# Patient Record
Sex: Male | Born: 1965 | Race: White | Hispanic: No | Marital: Married | Smoking: Heavy tobacco smoker
Health system: Southern US, Community
[De-identification: ages and names within clinical notes are randomized; demographics above are authoritative.]

## PROBLEM LIST (undated history)

## (undated) DIAGNOSIS — K219 Gastro-esophageal reflux disease without esophagitis: Secondary | ICD-10-CM

## (undated) DIAGNOSIS — I2510 Atherosclerotic heart disease of native coronary artery without angina pectoris: Secondary | ICD-10-CM

## (undated) DIAGNOSIS — I10 Essential (primary) hypertension: Secondary | ICD-10-CM

## (undated) DIAGNOSIS — E119 Type 2 diabetes mellitus without complications: Secondary | ICD-10-CM

## (undated) DIAGNOSIS — I639 Cerebral infarction, unspecified: Secondary | ICD-10-CM

## (undated) HISTORY — PX: CORONARY ANGIOPLASTY WITH STENT PLACEMENT: SHX49

---

## 2003-10-22 ENCOUNTER — Other Ambulatory Visit: Payer: Self-pay

## 2006-09-10 DIAGNOSIS — E785 Hyperlipidemia, unspecified: Secondary | ICD-10-CM | POA: Insufficient documentation

## 2006-09-10 DIAGNOSIS — K2100 Gastro-esophageal reflux disease with esophagitis, without bleeding: Secondary | ICD-10-CM | POA: Insufficient documentation

## 2008-06-18 HISTORY — PX: CORONARY ANGIOPLASTY WITH STENT PLACEMENT: SHX49

## 2013-07-15 ENCOUNTER — Emergency Department: Payer: Self-pay | Admitting: Emergency Medicine

## 2013-07-15 LAB — CBC WITH DIFFERENTIAL/PLATELET
Basophil #: 0.1 10*3/uL (ref 0.0–0.1)
Basophil %: 1 %
Eosinophil #: 0.1 10*3/uL (ref 0.0–0.7)
Eosinophil %: 0.9 %
HCT: 48.5 % (ref 40.0–52.0)
HGB: 17.1 g/dL (ref 13.0–18.0)
LYMPHS ABS: 3.5 10*3/uL (ref 1.0–3.6)
LYMPHS PCT: 23.7 %
MCH: 30.7 pg (ref 26.0–34.0)
MCHC: 35.3 g/dL (ref 32.0–36.0)
MCV: 87 fL (ref 80–100)
Monocyte #: 0.7 x10 3/mm (ref 0.2–1.0)
Monocyte %: 4.9 %
Neutrophil #: 10.2 10*3/uL — ABNORMAL HIGH (ref 1.4–6.5)
Neutrophil %: 69.5 %
PLATELETS: 377 10*3/uL (ref 150–440)
RBC: 5.58 10*6/uL (ref 4.40–5.90)
RDW: 13.1 % (ref 11.5–14.5)
WBC: 14.7 10*3/uL — ABNORMAL HIGH (ref 3.8–10.6)

## 2013-07-15 LAB — LIPASE, BLOOD: Lipase: 153 U/L (ref 73–393)

## 2013-07-16 LAB — TROPONIN I: Troponin-I: <0.02 ng/mL

## 2013-07-16 LAB — COMPREHENSIVE METABOLIC PANEL
ALT: 35 U/L (ref 12–78)
Albumin: 3.2 g/dL — ABNORMAL LOW (ref 3.4–5.0)
Alkaline Phosphatase: 112 U/L
Anion Gap: 12 (ref 7–16)
BILIRUBIN TOTAL: 0.8 mg/dL (ref 0.2–1.0)
BUN: 13 mg/dL (ref 7–18)
CALCIUM: 7.7 mg/dL — AB (ref 8.5–10.1)
Chloride: 95 mmol/L — ABNORMAL LOW (ref 98–107)
Co2: 26 mmol/L (ref 21–32)
Creatinine: 1.14 mg/dL (ref 0.60–1.30)
EGFR (African American): >60
GLUCOSE: 335 mg/dL — AB (ref 65–99)
Osmolality: 280 (ref 275–301)
POTASSIUM: 3.5 mmol/L (ref 3.5–5.1)
SODIUM: 133 mmol/L — AB (ref 136–145)
TOTAL PROTEIN: 6.9 g/dL (ref 6.4–8.2)

## 2013-07-16 LAB — HEMOGLOBIN A1C: HEMOGLOBIN A1C: 13 % — AB (ref 4.2–6.3)

## 2014-05-24 DIAGNOSIS — E782 Mixed hyperlipidemia: Secondary | ICD-10-CM | POA: Insufficient documentation

## 2014-05-24 DIAGNOSIS — I2510 Atherosclerotic heart disease of native coronary artery without angina pectoris: Secondary | ICD-10-CM | POA: Insufficient documentation

## 2014-05-24 DIAGNOSIS — I10 Essential (primary) hypertension: Secondary | ICD-10-CM | POA: Insufficient documentation

## 2014-07-22 ENCOUNTER — Telehealth: Payer: Self-pay | Admitting: Family Medicine

## 2014-07-22 NOTE — Telephone Encounter (Signed)
Son will try to get him in here next week for an appointment. States he is smoking 3+ packs of cigarettes a day along with the weight loss.

## 2014-07-22 NOTE — Telephone Encounter (Signed)
Pt's son called concerned over dad's health.  Would like to talk to someone.  Please call him at [phone].

## 2014-07-22 NOTE — Telephone Encounter (Signed)
Spoke with patients son Harrison Mons( Blake) who states that patient is not doing good, he states within the last year patient has been diagnosed with diabetes and he feels that patient is non compliant with medication and that patient has had dramatic weight loss in the past 6 months >30lbs. He states that patient looks like a "cancer patient" and he is concerned over his well being physically and mentally. Son would like patient be referred to a specialist in regards to his diabetes. And would like for Nadine CountsBob to give him a call back so he can discuss patients condition more.

## 2014-07-28 DIAGNOSIS — IMO0001 Reserved for inherently not codable concepts without codable children: Secondary | ICD-10-CM | POA: Insufficient documentation

## 2014-07-28 DIAGNOSIS — I2510 Atherosclerotic heart disease of native coronary artery without angina pectoris: Secondary | ICD-10-CM | POA: Insufficient documentation

## 2014-07-28 DIAGNOSIS — K219 Gastro-esophageal reflux disease without esophagitis: Secondary | ICD-10-CM | POA: Insufficient documentation

## 2014-07-28 DIAGNOSIS — E1165 Type 2 diabetes mellitus with hyperglycemia: Secondary | ICD-10-CM | POA: Insufficient documentation

## 2014-07-28 DIAGNOSIS — R030 Elevated blood-pressure reading, without diagnosis of hypertension: Secondary | ICD-10-CM

## 2014-07-29 ENCOUNTER — Ambulatory Visit (INDEPENDENT_AMBULATORY_CARE_PROVIDER_SITE_OTHER): Payer: Self-pay | Admitting: Family Medicine

## 2014-07-29 ENCOUNTER — Encounter: Payer: Self-pay | Admitting: Family Medicine

## 2014-07-29 VITALS — BP 152/86 | HR 70 | Temp 97.8°F | Resp 16 | Ht 70.0 in | Wt 177.6 lb

## 2014-07-29 DIAGNOSIS — E785 Hyperlipidemia, unspecified: Secondary | ICD-10-CM

## 2014-07-29 DIAGNOSIS — E1165 Type 2 diabetes mellitus with hyperglycemia: Secondary | ICD-10-CM

## 2014-07-29 DIAGNOSIS — I10 Essential (primary) hypertension: Secondary | ICD-10-CM

## 2014-07-29 DIAGNOSIS — Z720 Tobacco use: Secondary | ICD-10-CM

## 2014-07-29 LAB — POCT GLYCOSYLATED HEMOGLOBIN (HGB A1C): HEMOGLOBIN A1C: 6.8

## 2014-07-29 MED ORDER — VARENICLINE TARTRATE 0.5 MG PO TABS: 0.5000 mg | ORAL_TABLET | Freq: Two times a day (BID) | ORAL | Status: DC

## 2014-07-29 NOTE — Patient Instructions (Signed)
Try to remember to take your medication twice daily.

## 2014-07-29 NOTE — Progress Notes (Signed)
Subjective:     Patient ID: Kenneth CluckJimmy R [name], male   DOB: 11/18/1965, 49 y.o.   MRN: [id number]  HPI  Chief Complaint  Patient presents with  . Diabetes    patient presents for follow up appt from 05/21/14. Last office visit we increased Metformin to 1000mg  BID, to consider Glipizide. HgbA1C was 9.9%  He come in with his wife as his family is concerned about increased weight loss (10# over 3 months) and irritability. Continues to smoke 2.5 ppd. Reports he is taking metformin 1000 mg usually twice daily but will occasionally skip a dose. He tends to skip lisinopril and has not taken it for a week. States it gives him diarrhea.   Review of Systems  Respiratory: Negative for shortness of breath.   Cardiovascular: Negative for chest pain and palpitations.       Objective:   Physical Exam  Constitutional: He appears well-developed and well-nourished. No distress.  Cardiovascular: Normal rate and regular rhythm.   Pulmonary/Chest: Breath sounds normal.  Musculoskeletal: He exhibits no edema (in lower extremties).       Assessment:    1. Benign essential HTN-consider switch to losartan; patient defers for now. - Comprehensive metabolic panel; Future  2. Type 2 diabetes mellitus with hyperglycemia: controlled  - Comprehensive metabolic panel; Future - POCT glycosylated hemoglobin (Hb A1C)  3. HLD (hyperlipidemia) - Lipid panel; Future  4. Tobacco use: wishes to try Chantix as he has quit before on this. - varenicline (CHANTIX) 0.5 MG tablet; Take 1 tablet (0.5 mg total) by mouth 2 (two) times daily.  Dispense: 60 tablet; Refill: 0    Plan:    Further f/u pending lab work.

## 2014-07-30 ENCOUNTER — Other Ambulatory Visit: Payer: Self-pay | Admitting: Family Medicine

## 2014-07-30 DIAGNOSIS — I10 Essential (primary) hypertension: Secondary | ICD-10-CM

## 2014-07-30 DIAGNOSIS — Z0000 Encounter for general adult medical examination without abnormal findings: Secondary | ICD-10-CM

## 2014-07-30 DIAGNOSIS — Z13220 Encounter for screening for lipoid disorders: Secondary | ICD-10-CM

## 2014-07-31 LAB — LIPID PANEL
Chol/HDL Ratio: 5.6 ratio units — ABNORMAL HIGH (ref 0.0–5.0)
Cholesterol, Total: 174 mg/dL (ref 100–199)
HDL: 31 mg/dL — AB (ref >39)
LDL Calculated: 111 mg/dL — ABNORMAL HIGH (ref 0–99)
Triglycerides: 162 mg/dL — ABNORMAL HIGH (ref 0–149)
VLDL CHOLESTEROL CAL: 32 mg/dL (ref 5–40)

## 2014-07-31 LAB — COMPREHENSIVE METABOLIC PANEL
A/G RATIO: 1.9 (ref 1.1–2.5)
ALK PHOS: 82 IU/L (ref 39–117)
ALT: 13 IU/L (ref 0–44)
AST: 10 IU/L (ref 0–40)
Albumin: 4.5 g/dL (ref 3.5–5.5)
BUN/Creatinine Ratio: 7 — ABNORMAL LOW (ref 9–20)
BUN: 8 mg/dL (ref 6–24)
Bilirubin Total: 0.6 mg/dL (ref 0.0–1.2)
CHLORIDE: 99 mmol/L (ref 97–108)
CO2: 27 mmol/L (ref 18–29)
Calcium: 9.7 mg/dL (ref 8.7–10.2)
Creatinine, Ser: 1.09 mg/dL (ref 0.76–1.27)
GFR calc Af Amer: 92 mL/min/{1.73_m2} (ref >59)
GFR calc non Af Amer: 79 mL/min/{1.73_m2} (ref >59)
Globulin, Total: 2.4 g/dL (ref 1.5–4.5)
Glucose: 122 mg/dL — ABNORMAL HIGH (ref 65–99)
POTASSIUM: 4.5 mmol/L (ref 3.5–5.2)
Sodium: 142 mmol/L (ref 134–144)
TOTAL PROTEIN: 6.9 g/dL (ref 6.0–8.5)

## 2014-08-03 ENCOUNTER — Telehealth: Payer: Self-pay

## 2014-08-03 NOTE — Telephone Encounter (Signed)
-----   Message from Anola Gurneyobert Chauvin, [place] sent at 08/03/2014  8:03 AM EDT ----- Cholesterol is mildly elevated but with diabetes we usually recommend being placed on a statin drug. Do you wish to start medication? Your other labs were ok including kidneys and liver.

## 2014-08-03 NOTE — Telephone Encounter (Signed)
Patient advised of lab results, copy was printed for him to pick up. He states that he will think this over and speak with wife and call you back if he wishes to start statin drug.

## 2014-08-03 NOTE — Telephone Encounter (Signed)
LMTCB

## 2014-10-18 ENCOUNTER — Ambulatory Visit: Payer: Self-pay | Admitting: Family Medicine

## 2015-06-19 ENCOUNTER — Emergency Department
Admission: EM | Admit: 2015-06-19 | Discharge: 2015-06-19 | Disposition: A | Discharge status: Home / Self Care | Payer: Self-pay | Attending: Emergency Medicine | Admitting: Emergency Medicine

## 2015-06-19 ENCOUNTER — Emergency Department: Payer: Self-pay

## 2015-06-19 DIAGNOSIS — E785 Hyperlipidemia, unspecified: Secondary | ICD-10-CM | POA: Insufficient documentation

## 2015-06-19 DIAGNOSIS — E1165 Type 2 diabetes mellitus with hyperglycemia: Secondary | ICD-10-CM | POA: Insufficient documentation

## 2015-06-19 DIAGNOSIS — F17210 Nicotine dependence, cigarettes, uncomplicated: Secondary | ICD-10-CM | POA: Insufficient documentation

## 2015-06-19 DIAGNOSIS — R079 Chest pain, unspecified: Secondary | ICD-10-CM | POA: Insufficient documentation

## 2015-06-19 DIAGNOSIS — I10 Essential (primary) hypertension: Secondary | ICD-10-CM | POA: Insufficient documentation

## 2015-06-19 DIAGNOSIS — Z7984 Long term (current) use of oral hypoglycemic drugs: Secondary | ICD-10-CM | POA: Insufficient documentation

## 2015-06-19 HISTORY — DX: Atherosclerotic heart disease of native coronary artery without angina pectoris: I25.10

## 2015-06-19 HISTORY — DX: Type 2 diabetes mellitus without complications: E11.9

## 2015-06-19 HISTORY — DX: Essential (primary) hypertension: I10

## 2015-06-19 LAB — BASIC METABOLIC PANEL
ANION GAP: 7 (ref 5–15)
BUN: 15 mg/dL (ref 6–20)
CALCIUM: 8.9 mg/dL (ref 8.9–10.3)
CO2: 25 mmol/L (ref 22–32)
Chloride: 105 mmol/L (ref 101–111)
Creatinine, Ser: 1.11 mg/dL (ref 0.61–1.24)
GLUCOSE: 227 mg/dL — AB (ref 65–99)
Potassium: 3.9 mmol/L (ref 3.5–5.1)
SODIUM: 137 mmol/L (ref 135–145)

## 2015-06-19 LAB — CBC
HCT: 47.3 % (ref 40.0–52.0)
HEMOGLOBIN: 16.2 g/dL (ref 13.0–18.0)
MCH: 29.3 pg (ref 26.0–34.0)
MCHC: 34.3 g/dL (ref 32.0–36.0)
MCV: 85.4 fL (ref 80.0–100.0)
Platelets: 270 10*3/uL (ref 150–440)
RBC: 5.54 MIL/uL (ref 4.40–5.90)
RDW: 12.9 % (ref 11.5–14.5)
WBC: 11.8 10*3/uL — AB (ref 3.8–10.6)

## 2015-06-19 LAB — TROPONIN I
TROPONIN I: 0.04 ng/mL — AB (ref <0.031)
TROPONIN I: 0.04 ng/mL — AB (ref <0.031)

## 2015-06-19 NOTE — ED Notes (Signed)
Pt requesting to leave, explained that Dr Sheryle Hailiamond would be down shortly to go over results.  Pt demanding to have IV out and to leave facility.  IV removed by Janus MolderVanessa Ashley, RN.  Pt asking for lab results, educated patient that the results have to come from the physician or that they can call medical records to get a copy of results.  Explained that physician is in an emergency at this time, but will be back to talk with patient shortly.  Pt states he will wait for a "little bit" longer for physician to give patient results.  Pt signed AMA paperwork prior to agreeing to stay for physician.

## 2015-06-19 NOTE — ED Notes (Signed)
Pt states that all of his sx have resolved. Pt no longer c/o chest pain, HA or left arm aching/numbness. Pt alert and oriented X4, active, cooperative, pt in NAD. RR even and unlabored, color WNL.

## 2015-06-19 NOTE — ED Notes (Signed)
Troponin of 0.04 verbal readback to [place], acknowledges.

## 2015-06-19 NOTE — ED Notes (Signed)
Pt left AMA.  Family member driving.  Prescriptions written by Dr. Sheryle Hailiamond and instructions given to patient.  Verbalized understanding.  Pt to follow up with Dr Gwen PoundsKowalski this week for out patient stress test.  Pt verbalized understanding of coming back to ER if symptoms returned or worsened.  No questions or concerns at this time.

## 2015-06-19 NOTE — ED Notes (Signed)
DR. Sheryle Hailiamond at bedside.

## 2015-06-19 NOTE — ED Notes (Signed)
Pt reports that he took 4 baby ASA PTA.

## 2015-06-19 NOTE — ED Notes (Signed)
MD at bedside. 

## 2015-06-19 NOTE — ED Notes (Signed)
Pt signed out AMA.  Pt given Coreg prescription by Dr. Sheryle Hailiamond and counseled to stop smoking.  Pt verbalized understanding.  Pt to follow up with Dr. Gwen PoundsKowalski this week for outpatient stress test.

## 2015-06-19 NOTE — ED Notes (Signed)
Report to Iris, RN  

## 2015-06-19 NOTE — ED Provider Notes (Addendum)
[facility] Emergency Department Provider Note  ____________________________________________   I have reviewed the triage vital signs and the nursing notes.   HISTORY  Chief Complaint Chest Pain    HPI Kenneth [name] is a 50 y.o. male with a history of stents, placed in 2010,hypertension and high cholesterol diabetes, who does not take a daily aspirin or Plavix, is no longer taking any blood pressure medication. States that he had a left-sided squeezing chest discomfort which radiated to the left arm which started around 10:30 at rest. Not exertional. He has had no exertional dyspnea. It was not positional. No recollected injury. He denies any nausea or vomiting. He denies any shortness of breath. He states the pain was not pleuritic. He has had no calf pain recent travel or personal or family history of PE or DVT. He states he had a very slight headache at the same time. This is his normal headache. He did take 4 baby aspirin. He denies any neurologic symptoms otherwise. The pain was not tearing, it was gradual in onset did not radiate to the back. He did have some pain that went down his left arm. This was similar but not identical to the pain that he had when he had a stent placed in 2010. He states his pain is a 1 or 2 out of 10 when I'm in the room. He would prefer not to have anything else for pain this time. He told me that he was not diaphoretic. Nothing made the pain better and nothing made the pain worse.    Past Medical History  Diagnosis Date  . Hypertension   . Coronary artery disease   . Diabetes mellitus without complication [facility])     Patient Active Problem List   Diagnosis Date Noted  . Arteriosclerosis of coronary artery 07/28/2014  . Type 2 diabetes mellitus with hyperglycemia (HCC) 07/28/2014  . Blood pressure elevated 07/28/2014  . Esophageal reflux 07/28/2014  . Combined fat and carbohydrate induced hyperlipemia 05/24/2014   . CAD in native artery 05/24/2014  . Benign essential HTN 05/24/2014  . Esophagitis, reflux 09/10/2006  . HLD (hyperlipidemia) 09/10/2006    Past Surgical History  Procedure Laterality Date  . Coronary angioplasty with stent placement  06/18/2008  . Coronary angioplasty with stent placement      Current Outpatient Rx  Name  Route  Sig  Dispense  Refill  . aspirin (ASPIRIN LOW DOSE) 81 MG EC tablet   Oral   Take 1 tablet by mouth daily.         Marland Kitchen EXPIRED: lisinopril (PRINIVIL,ZESTRIL) 10 MG tablet   Oral   Take by mouth.         . metFORMIN (GLUCOPHAGE) 500 MG tablet   Oral   Take 1 tablet by mouth 2 (two) times daily.         . varenicline (CHANTIX) 0.5 MG tablet   Oral   Take 1 tablet (0.5 mg total) by mouth 2 (two) times daily.   60 tablet   0     Allergies Review of patient's allergies indicates no known allergies.  Family History  Problem Relation Age of Onset  . Heart attack Mother   . Heart failure Mother   . Coronary artery disease Father   . Diabetes Father     Social History Social History  Substance Use Topics  . Smoking status: Heavy Tobacco Smoker -- 2.00 packs/day    Types: Cigarettes  . Smokeless tobacco:  None  . Alcohol Use: No     Comment: OCCASIONALLY    Review of Systems Constitutional: No fever/chills Eyes: No visual changes. ENT: No sore throat. No stiff neck no neck pain Cardiovascular: Positive chest pain. Respiratory: Denies shortness of breath. Gastrointestinal:   no vomiting.  No diarrhea.  No constipation. Genitourinary: Negative for dysuria. Musculoskeletal: Negative lower extremity swelling Skin: Negative for rash. Neurological: Negative for headaches, focal weakness or numbness. 10-point ROS otherwise negative.  ____________________________________________   PHYSICAL EXAM:  VITAL SIGNS: ED Triage Vitals  Enc Vitals Group     BP 06/19/15 1659 190/118 mmHg     Pulse Rate 06/19/15 1659 97     Resp 06/19/15  1659 22     Temp 06/19/15 1659 99 F (37.2 C)     Temp Source 06/19/15 1659 Oral     SpO2 06/19/15 1659 98 %     Weight 06/19/15 1659 170 lb (77.111 kg)     Height 06/19/15 1659  (1.778 m)     Head Cir --      Peak Flow --      Pain Score 06/19/15 1700 3     Pain Loc --      Pain Edu? --      Excl. in GC? --     Constitutional: Alert and oriented. Well appearing and in no acute distress. Eyes: Conjunctivae are normal. PERRL. EOMI. Head: Atraumatic. Nose: No congestion/rhinnorhea. Mouth/Throat: Mucous membranes are moist.  Oropharynx non-erythematous. Neck: No stridor.   Nontender with no meningismus Cardiovascular: Normal rate, regular rhythm. Grossly normal heart sounds.  Good peripheral circulation. Respiratory: Normal respiratory effort.  No retractions. Lungs CTAB. Abdominal: Soft and nontender. No distention. No guarding no rebound Back:  There is no focal tenderness or step off there is no midline tenderness there are no lesions noted. there is no CVA tenderness Musculoskeletal: No lower extremity tenderness. No joint effusions, no DVT signs strong distal pulses no edema Neurologic:  Normal speech and language. No gross focal neurologic deficits are appreciated.  Skin:  Skin is warm, dry and intact. No rash noted. Psychiatric: Mood and affect are normal. Speech and behavior are normal.  ____________________________________________   LABS (all labs ordered are listed, but only abnormal results are displayed)  Labs Reviewed  BASIC METABOLIC PANEL - Abnormal; Notable for the following:    Glucose, Bld 227 (*)    All other components within normal limits  CBC - Abnormal; Notable for the following:    WBC 11.8 (*)    All other components within normal limits  TROPONIN I - Abnormal; Notable for the following:    Troponin I 0.04 (*)    All other components within normal limits   ____________________________________________  EKG  I personally interpreted any EKGs  ordered by me or triage EKG #1: Sinus rhythm rate 90 bpm no acute ST elevation or depression, nonspecific ST changes noted, normal axis. EKG #2: Sinus rhythm rate 83 bpm, no acute ST elevation or depression normal axis unremarkable EKG  ____________________________________________  RADIOLOGY  I reviewed any imaging ordered by me or triage that were performed during my shift and, if possible, patient and/or family made aware of any abnormal findings. ____________________________________________   PROCEDURES  Procedure(s) performed: None  Critical Care performed: None  ____________________________________________   INITIAL IMPRESSION / ASSESSMENT AND PLAN / ED COURSE  Pertinent labs & imaging results that were available during my care of the patient were reviewed by me and considered  in my medical decision making (see chart for details).  Patient with reassuring exam, nothing to suggest PE or dissection based on history. However he does have a gradual onset left-sided chest pain that radiated down his left arm. He is pain-free at this moment. He did have aspirin already. Troponin is borderline but this is not an unusual finding for lab and is in fact quite nonspecific. We will keep the patient for a second cardiac enzyme and talk to Dr. Gwen PoundsKowalski his cardiologist. We will monitor him closely in the emergency room. Serial EKGs no evidence of evolving ischemia. A he is a symptomatic this time. He did have a slight headache there is no evidence of CVA, and the headache is completely gone on its own. Patient is much calmer at this time initially he was quite anxious, or is trending down rapidly as he relaxes. He is a heavy smoker with significant CAD history however and we will be very vigilant. Patient and family counseled about this plan and agree.  ----------------------------------------- 6:16 PM on 06/19/2015 -----------------------------------------  Son takes me aside and states his  father is smoking 4-5 packs of cigarettes a day and gets winded just walking to the mailbox. This is different from what the father told me. The patient son states that his father will deny this. Given this and his concerning story with chest pain radiating to left arm I think it would be beneficial to admit the patient. We will discuss with the hospitalist.  ----------------------------------------- 7:30 PM on 06/19/2015 -----------------------------------------  I have strongly advised admission to the hospital until the patient is at risk for death if he goes home. This is baseline for him in no uncertain terms. After this discussion, patient became anxious and his blood pressure went up but he is not having any ongoing chest pain. He is at this time declining admission "for now". I have also talked to the hospitalist, Dr. Sheryle Haildiamond, who was kind enough to talk to the patient and also strenuously advised admission, patient however is at this time waiting for the second troponin to come back before he "makes up his mind". He is asymptomatic this time and we will continue to monitor him.  ----------------------------------------- 9:06 PM on 06/19/2015 -----------------------------------------  After extensive discussion with myself and Dr. Sheryle Haildiamond about the risks of going home and our strong advice that he stay, patient is elected to leave AGAINST MEDICAL ADVICE. He understands the risk of . Death or significant morbidity from heart problems including becoming a cardiac cripple. Patient's blood pressures up and he is aware of this. He refuses to say he refuses any more intervention he is requesting that we remove the IV and discharge him AGAINST MEDICAL ADVICE right away. He is at significant back at any time. Family are present in the room during this conversation. I feel patient understands the risks benefits and alternatives and is capable of making up his mind about the decision to go home     ____________________________________________   FINAL CLINICAL IMPRESSION(S) / ED DIAGNOSES  Final diagnoses:  None      This chart was dictated using voice recognition software.  Despite best efforts to proofread,  errors can occur which can change meaning.     Jeanmarie PlantJames A McShane, MD 06/19/15 1801  Jeanmarie PlantJames A McShane, MD 06/19/15 [id number]  Jeanmarie PlantJames A McShane, MD 06/19/15 1931  Jeanmarie PlantJames A McShane, MD 06/19/15 2107

## 2015-06-19 NOTE — ED Notes (Signed)
Pt states that he does not want to stay overnight in hospital at this time. Told patient to discuss with admitting MD. Pt states that Dr. Gwen PoundsKowalski is his cardiologist and that he needs to have stress test done with Gwen PoundsKowalski due to upcoming DOT physical. Explained to patient risks of leaving. Pt agrees to speak with Admitting MD about concerns. Pt alert and oriented X4, active, cooperative, pt in NAD. RR even and unlabored, color WNL.  Family at bedside. Pt still reports that all previous sx are gone.

## 2015-06-19 NOTE — ED Notes (Addendum)
Pt arrives to ER via POV c/o chest pain to left side that radiates to left arm, describes as numbness. Pain began today at 1030AM. [facility]; hx of hypertension but does not take medication. Hx of cardiac stent X 7 years ago. Pt reports headache. Denies NV, diaphoresis. Pt alert and oriented X4, active, cooperative, pt in NAD. RR even and unlabored, color WNL. Pt mildly diaphoretic.

## 2015-06-19 NOTE — ED Notes (Signed)
Pt back from xray and on cardiac monitor at this time.

## 2015-06-19 NOTE — ED Notes (Signed)
Patient transported to X-ray 

## 2015-06-19 NOTE — Progress Notes (Signed)
The patient is adamant that he does not want to stay due to financial concerns as well as anticipated delay in stress testing due to his doctor being away from her holiday weekend. I have urged him not to smoke. He states he will get NicoDerm patches over-the-counter as well as niacin. I have prescribed him Coreg 6.25 mg twice a day to decrease myocardial oxygen demand. The patient promises that he will follow-up with his cardiologist in 2 days.

## 2015-06-30 ENCOUNTER — Ambulatory Visit (INDEPENDENT_AMBULATORY_CARE_PROVIDER_SITE_OTHER): Payer: Self-pay | Admitting: Family Medicine

## 2015-06-30 ENCOUNTER — Encounter: Payer: Self-pay | Admitting: Family Medicine

## 2015-06-30 VITALS — BP 138/100 | HR 76 | Temp 97.9°F | Resp 16 | Wt 195.0 lb

## 2015-06-30 DIAGNOSIS — I2510 Atherosclerotic heart disease of native coronary artery without angina pectoris: Secondary | ICD-10-CM

## 2015-06-30 DIAGNOSIS — I10 Essential (primary) hypertension: Secondary | ICD-10-CM | POA: Insufficient documentation

## 2015-06-30 DIAGNOSIS — E1165 Type 2 diabetes mellitus with hyperglycemia: Secondary | ICD-10-CM

## 2015-06-30 DIAGNOSIS — E785 Hyperlipidemia, unspecified: Secondary | ICD-10-CM

## 2015-06-30 LAB — POCT GLYCOSYLATED HEMOGLOBIN (HGB A1C): Hemoglobin A1C: 7.6

## 2015-06-30 MED ORDER — ATORVASTATIN CALCIUM 80 MG PO TABS: 80.0000 mg | ORAL_TABLET | Freq: Every day | ORAL | Status: DC

## 2015-06-30 NOTE — Progress Notes (Signed)
Subjective:     Patient ID: Kenneth [name], male   DOB: 01/19/1966, 50 y.o.   MRN: [id number]  HPI  Chief Complaint  Patient presents with  . Diabetes    Patient returns to office today for follow up visit, last visit was 07/29/14 patients HgbA1C in house was 6.8%. Patient reports good compliance and tolerance on medication. Patient reports that he checks his blood sugar at home from time to time and recent high was 140, patient denies any hyperglycemia incidents  . Hypertension    Patient returns to office for follow up, last visit was 07/29/14. At last office visit it was discuss switching patient to Losartan, but at time of visit patient declined. [facility] for hypertension he states b/p was >200/100. Patient reports he was started on Lisinopril and reports good compliance on medication  . Hyperlipidemia    Follow up from 07/29/14, Lipid panel was ordered at last visit.   Lost to f/u for diabetes and hypertension comes in today after ER and cardiology evaluation. HTN was under poor control and he is pending stress test next week for evaluation of chest pain. Hx of CAD with stents. States he has remained in metformin 1000 mg.twice daily (left over medication from his father's prescription) but misses his AM dose "50% of the time." He has had his lisinopril increased to 10 mg. Two pills twice daily and started on Wellbutrin and Nicotine patches for smoking cessation. 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   Review of Systems     Objective:   Physical Exam  Constitutional: He appears well-developed and well-nourished. No distress.  Lungs: clear Heart: RRR without murmur Lower extremities: no edema; pedal pulses intact, sensation to monofilament intact, no wounds noted.     Assessment:    1. Type 2 diabetes mellitus with hyperglycemia, without long-term  current use of insulin (HCC) - POCT glycosylated hemoglobin (Hb A1C)  2. Arteriosclerosis of coronary artery: per cardiology  3. HLD (hyperlipidemia) - atorvastatin (LIPITOR) 80 MG tablet; Take 1 tablet (80 mg total) by mouth daily.  Dispense: 90 tablet; Refill: 3  4. Essential hypertension: recently increased on medication    Plan:    Stressed taking both metformin doses daily. F/u with cardiology. Continue with smoking cessation. Patient is also using a nicotine patch.

## 2015-06-30 NOTE — Patient Instructions (Signed)
Follow up with Dr. Gwen PoundsKowalski as scheduled. Remember to take both doses of Metformin daily with a meal.

## 2015-09-30 ENCOUNTER — Ambulatory Visit: Payer: Self-pay | Admitting: Family Medicine

## 2015-12-29 ENCOUNTER — Encounter: Payer: Self-pay | Admitting: Emergency Medicine

## 2015-12-29 ENCOUNTER — Observation Stay: Payer: Self-pay

## 2015-12-29 ENCOUNTER — Emergency Department: Payer: Self-pay

## 2015-12-29 ENCOUNTER — Observation Stay
Admit: 2015-12-29 | Discharge: 2015-12-29 | Disposition: A | Discharge status: Home / Self Care | Payer: Self-pay | Attending: Internal Medicine | Admitting: Internal Medicine

## 2015-12-29 ENCOUNTER — Inpatient Hospital Stay
Admission: EM | Admit: 2015-12-29 | Discharge: 2015-12-30 | DRG: 066 | Disposition: A | Discharge status: Home / Self Care | Payer: Self-pay | Attending: Internal Medicine | Admitting: Internal Medicine

## 2015-12-29 DIAGNOSIS — I69354 Hemiplegia and hemiparesis following cerebral infarction affecting left non-dominant side: Secondary | ICD-10-CM

## 2015-12-29 DIAGNOSIS — E1165 Type 2 diabetes mellitus with hyperglycemia: Secondary | ICD-10-CM | POA: Diagnosis present

## 2015-12-29 DIAGNOSIS — I6350 Cerebral infarction due to unspecified occlusion or stenosis of unspecified cerebral artery: Secondary | ICD-10-CM

## 2015-12-29 DIAGNOSIS — I2510 Atherosclerotic heart disease of native coronary artery without angina pectoris: Secondary | ICD-10-CM | POA: Diagnosis present

## 2015-12-29 DIAGNOSIS — E781 Pure hyperglyceridemia: Secondary | ICD-10-CM | POA: Diagnosis present

## 2015-12-29 DIAGNOSIS — Z23 Encounter for immunization: Secondary | ICD-10-CM

## 2015-12-29 DIAGNOSIS — Z8249 Family history of ischemic heart disease and other diseases of the circulatory system: Secondary | ICD-10-CM

## 2015-12-29 DIAGNOSIS — G459 Transient cerebral ischemic attack, unspecified: Secondary | ICD-10-CM

## 2015-12-29 DIAGNOSIS — Z833 Family history of diabetes mellitus: Secondary | ICD-10-CM

## 2015-12-29 DIAGNOSIS — R262 Difficulty in walking, not elsewhere classified: Secondary | ICD-10-CM

## 2015-12-29 DIAGNOSIS — Z7984 Long term (current) use of oral hypoglycemic drugs: Secondary | ICD-10-CM

## 2015-12-29 DIAGNOSIS — I639 Cerebral infarction, unspecified: Principal | ICD-10-CM

## 2015-12-29 DIAGNOSIS — R2681 Unsteadiness on feet: Secondary | ICD-10-CM

## 2015-12-29 DIAGNOSIS — F17210 Nicotine dependence, cigarettes, uncomplicated: Secondary | ICD-10-CM | POA: Diagnosis present

## 2015-12-29 DIAGNOSIS — R29700 NIHSS score 0: Secondary | ICD-10-CM | POA: Diagnosis present

## 2015-12-29 DIAGNOSIS — Z955 Presence of coronary angioplasty implant and graft: Secondary | ICD-10-CM

## 2015-12-29 DIAGNOSIS — I10 Essential (primary) hypertension: Secondary | ICD-10-CM | POA: Diagnosis present

## 2015-12-29 DIAGNOSIS — I63211 Cerebral infarction due to unspecified occlusion or stenosis of right vertebral arteries: Secondary | ICD-10-CM | POA: Diagnosis present

## 2015-12-29 DIAGNOSIS — R200 Anesthesia of skin: Secondary | ICD-10-CM

## 2015-12-29 DIAGNOSIS — E1151 Type 2 diabetes mellitus with diabetic peripheral angiopathy without gangrene: Secondary | ICD-10-CM | POA: Diagnosis present

## 2015-12-29 LAB — URINE DRUG SCREEN, QUALITATIVE (ARMC ONLY)
Amphetamines, Ur Screen: NOT DETECTED
BARBITURATES, UR SCREEN: NOT DETECTED
Benzodiazepine, Ur Scrn: NOT DETECTED
CANNABINOID 50 NG, UR ~~LOC~~: NOT DETECTED
Cocaine Metabolite,Ur ~~LOC~~: NOT DETECTED
MDMA (ECSTASY) UR SCREEN: NOT DETECTED
Methadone Scn, Ur: NOT DETECTED
Opiate, Ur Screen: NOT DETECTED
PHENCYCLIDINE (PCP) UR S: NOT DETECTED
TRICYCLIC, UR SCREEN: NOT DETECTED

## 2015-12-29 LAB — CBC
HEMATOCRIT: 51.3 % (ref 40.0–52.0)
HEMOGLOBIN: 17.9 g/dL (ref 13.0–18.0)
MCH: 28.7 pg (ref 26.0–34.0)
MCHC: 35 g/dL (ref 32.0–36.0)
MCV: 82 fL (ref 80.0–100.0)
Platelets: 273 10*3/uL (ref 150–440)
RBC: 6.25 MIL/uL — AB (ref 4.40–5.90)
RDW: 14.1 % (ref 11.5–14.5)
WBC: 7.9 10*3/uL (ref 3.8–10.6)

## 2015-12-29 LAB — URINALYSIS, COMPLETE (UACMP) WITH MICROSCOPIC
BACTERIA UA: NONE SEEN
Bilirubin Urine: NEGATIVE
Glucose, UA: >=500 mg/dL — AB
Ketones, ur: NEGATIVE mg/dL
Leukocytes, UA: NEGATIVE
Nitrite: NEGATIVE
PROTEIN: NEGATIVE mg/dL
SPECIFIC GRAVITY, URINE: 1.001 — AB (ref 1.005–1.030)
WBC UA: NONE SEEN WBC/hpf (ref 0–5)
pH: 6 (ref 5.0–8.0)

## 2015-12-29 LAB — COMPREHENSIVE METABOLIC PANEL
ALK PHOS: 91 U/L (ref 38–126)
ALT: 13 U/L — AB (ref 17–63)
AST: 18 U/L (ref 15–41)
Albumin: 4.2 g/dL (ref 3.5–5.0)
Anion gap: 9 (ref 5–15)
BUN: 12 mg/dL (ref 6–20)
CALCIUM: 9.4 mg/dL (ref 8.9–10.3)
CHLORIDE: 102 mmol/L (ref 101–111)
CO2: 24 mmol/L (ref 22–32)
CREATININE: 0.9 mg/dL (ref 0.61–1.24)
Glucose, Bld: 231 mg/dL — ABNORMAL HIGH (ref 65–99)
Potassium: 4 mmol/L (ref 3.5–5.1)
Sodium: 135 mmol/L (ref 135–145)
Total Bilirubin: 0.9 mg/dL (ref 0.3–1.2)
Total Protein: 7.9 g/dL (ref 6.5–8.1)

## 2015-12-29 LAB — PROTIME-INR
INR: 0.89
Prothrombin Time: 12 seconds (ref 11.4–15.2)

## 2015-12-29 LAB — DIFFERENTIAL
BASOS ABS: 0.1 10*3/uL (ref 0–0.1)
BASOS PCT: 1 %
Eosinophils Absolute: 0.1 10*3/uL (ref 0–0.7)
Eosinophils Relative: 1 %
LYMPHS ABS: 3.1 10*3/uL (ref 1.0–3.6)
LYMPHS PCT: 39 %
MONO ABS: 0.7 10*3/uL (ref 0.2–1.0)
MONOS PCT: 9 %
NEUTROS ABS: 3.9 10*3/uL (ref 1.4–6.5)
Neutrophils Relative %: 50 %

## 2015-12-29 LAB — TROPONIN I
TROPONIN I: 0.06 ng/mL — AB (ref <0.03)
TROPONIN I: 0.06 ng/mL — AB (ref <0.03)
Troponin I: 0.06 ng/mL (ref <0.03)

## 2015-12-29 LAB — GLUCOSE, CAPILLARY
Glucose-Capillary: 202 mg/dL — ABNORMAL HIGH (ref 65–99)
Glucose-Capillary: 242 mg/dL — ABNORMAL HIGH (ref 65–99)

## 2015-12-29 LAB — APTT: APTT: 41 s — AB (ref 24–36)

## 2015-12-29 MED ORDER — ACETAMINOPHEN 325 MG PO TABS: 650.0000 mg | ORAL_TABLET | Freq: Four times a day (QID) | ORAL | Status: DC | PRN

## 2015-12-29 MED ORDER — INSULIN ASPART 100 UNIT/ML ~~LOC~~ SOLN
0.0000 [IU] | Freq: Three times a day (TID) | SUBCUTANEOUS | Status: DC
  Administered 2015-12-29: 3 [IU] via SUBCUTANEOUS
  Administered 2015-12-30: 5 [IU] via SUBCUTANEOUS
  Administered 2015-12-30: 9 [IU] via SUBCUTANEOUS
  Filled 2015-12-29: qty 3
  Filled 2015-12-29: qty 5
  Filled 2015-12-29: qty 9

## 2015-12-29 MED ORDER — ENOXAPARIN SODIUM 40 MG/0.4ML ~~LOC~~ SOLN
40.0000 mg | SUBCUTANEOUS | Status: DC
  Administered 2015-12-29 – 2015-12-30 (×2): 40 mg via SUBCUTANEOUS
  Filled 2015-12-29 (×2): qty 0.4

## 2015-12-29 MED ORDER — INSULIN ASPART 100 UNIT/ML ~~LOC~~ SOLN
0.0000 [IU] | Freq: Every day | SUBCUTANEOUS | Status: DC
  Administered 2015-12-29: 20:00:00 2 [IU] via SUBCUTANEOUS
  Filled 2015-12-29: qty 2

## 2015-12-29 MED ORDER — SODIUM CHLORIDE 0.9% FLUSH
3.0000 mL | Freq: Two times a day (BID) | INTRAVENOUS | Status: DC
  Administered 2015-12-29 – 2015-12-30 (×3): 3 mL via INTRAVENOUS

## 2015-12-29 MED ORDER — BUPROPION HCL ER (SR) 100 MG PO TB12
100.0000 mg | ORAL_TABLET | Freq: Two times a day (BID) | ORAL | Status: DC
  Administered 2015-12-29 – 2015-12-30 (×3): 100 mg via ORAL
  Filled 2015-12-29 (×4): qty 1

## 2015-12-29 MED ORDER — PNEUMOCOCCAL VAC POLYVALENT 25 MCG/0.5ML IJ INJ
0.5000 mL | INJECTION | INTRAMUSCULAR | Status: AC
  Administered 2015-12-30: 0.5 mL via INTRAMUSCULAR
  Filled 2015-12-29: qty 0.5

## 2015-12-29 MED ORDER — HYDRALAZINE HCL 20 MG/ML IJ SOLN: 10.0000 mg | INTRAMUSCULAR | Status: DC | PRN

## 2015-12-29 MED ORDER — ASPIRIN 81 MG PO CHEW
324.0000 mg | CHEWABLE_TABLET | Freq: Once | ORAL | Status: AC
  Administered 2015-12-29: 324 mg via ORAL
  Filled 2015-12-29: qty 4

## 2015-12-29 MED ORDER — ASPIRIN 300 MG RE SUPP: 300.0000 mg | Freq: Every day | RECTAL | Status: DC

## 2015-12-29 MED ORDER — OXYCODONE HCL 5 MG PO TABS: 5.0000 mg | ORAL_TABLET | ORAL | Status: DC | PRN

## 2015-12-29 MED ORDER — ACETAMINOPHEN 650 MG RE SUPP: 650.0000 mg | Freq: Four times a day (QID) | RECTAL | Status: DC | PRN

## 2015-12-29 MED ORDER — ASPIRIN 325 MG PO TABS
325.0000 mg | ORAL_TABLET | Freq: Every day | ORAL | Status: DC
  Administered 2015-12-30: 325 mg via ORAL
  Filled 2015-12-29: qty 1

## 2015-12-29 MED ORDER — STROKE: EARLY STAGES OF RECOVERY BOOK
Freq: Once | Status: AC
  Administered 2015-12-29: 13:00:00 1

## 2015-12-29 MED ORDER — NICOTINE 21 MG/24HR TD PT24
21.0000 mg | MEDICATED_PATCH | Freq: Every day | TRANSDERMAL | Status: DC
  Administered 2015-12-29 – 2015-12-30 (×2): 21 mg via TRANSDERMAL
  Filled 2015-12-29 (×2): qty 1

## 2015-12-29 MED ORDER — ATORVASTATIN CALCIUM 20 MG PO TABS
80.0000 mg | ORAL_TABLET | Freq: Every day | ORAL | Status: DC
  Administered 2015-12-29 – 2015-12-30 (×2): 80 mg via ORAL
  Filled 2015-12-29 (×2): qty 4

## 2015-12-29 MED ORDER — ONDANSETRON HCL 4 MG PO TABS
4.0000 mg | ORAL_TABLET | Freq: Four times a day (QID) | ORAL | Status: DC | PRN
Start: 2015-12-29 — End: 2015-12-30

## 2015-12-29 MED ORDER — ONDANSETRON HCL 4 MG/2ML IJ SOLN: 4.0000 mg | Freq: Four times a day (QID) | INTRAMUSCULAR | Status: DC | PRN

## 2015-12-29 NOTE — Consult Note (Signed)
Referring Physician: Hower    Chief Complaint: Left sided numbness, difficult with gait  HPI: Kenneth [name] is an 50 y.o. male with a history of DM who reports that he laid down to watch television yesterday and was at baseline.  Patient got up to go to the kitchen later and noted LUE numbness.  Soon became uncoordinated with gait and noted that he was having difficulty controlling his movements on the left.  Although he had difficulty getting to bed, decided to got to sleep.  Was no better this morning and presented for evaluation. Initial NIHSS of 0.   Patient reports taking ASA almost daily.    Date last known well: Date: 12/28/2015 Time last known well: Time: 19:30 tPA Given: No: Outside time window  Past Medical History:  Diagnosis Date  . Coronary artery disease   . Diabetes mellitus without complication (HCC)   . Hypertension     Past Surgical History:  Procedure Laterality Date  . CORONARY ANGIOPLASTY WITH STENT PLACEMENT  06/18/2008  . CORONARY ANGIOPLASTY WITH STENT PLACEMENT      Family History  Problem Relation Age of Onset  . Heart attack Mother   . Heart failure Mother   . Coronary artery disease Father   . Diabetes Father    Social History:  reports that he has been smoking Cigarettes.  He has been smoking about 2.00 packs per day. He has never used smokeless tobacco. He reports that he does not drink alcohol or use drugs.  Allergies: No Known Allergies  Medications:  I have reviewed the patient's current medications. Prior to Admission:  Prescriptions Prior to Admission  Medication Sig Dispense Refill Last Dose  . atorvastatin (LIPITOR) 80 MG tablet Take 1 tablet (80 mg total) by mouth daily. 90 tablet 3 12/28/2015 at 0900  . buPROPion (WELLBUTRIN SR) 100 MG 12 hr tablet Take 100 mg by mouth 2 (two) times daily.    12/28/2015 at  1800  . lisinopril (PRINIVIL,ZESTRIL) 10 MG tablet Take 10 mg by mouth 2 (two) times daily. Two tablets in the morning and 1 tablet in  the evening   12/28/2015 at 1800  . metFORMIN (GLUCOPHAGE) 1000 MG tablet Take 1,000 mg by mouth 2 (two) times daily with a meal.   12/28/2015 at 1800   Scheduled: . aspirin  300 mg Rectal Daily   Or  . aspirin  325 mg Oral Daily  . atorvastatin  80 mg Oral Daily  . buPROPion  100 mg Oral BID  . enoxaparin (LOVENOX) injection  40 mg Subcutaneous Q24H  . insulin aspart  0-5 Units Subcutaneous QHS  . insulin aspart  0-9 Units Subcutaneous TID WC  . nicotine  21 mg Transdermal Daily  . [START ON 12/30/2015] pneumococcal 23 valent vaccine  0.5 mL Intramuscular Tomorrow-1000  . sodium chloride flush  3 mL Intravenous Q12H    ROS: History obtained from the patient  General ROS: negative for - chills, fatigue, fever, night sweats, weight gain or weight loss Psychological ROS: negative for - behavioral disorder, hallucinations, memory difficulties, mood swings or suicidal ideation Ophthalmic ROS: negative for - blurry vision, double vision, eye [name] or loss of vision ENT ROS: negative for - epistaxis, nasal discharge, oral lesions, sore throat, tinnitus or vertigo Allergy and Immunology ROS: negative for - hives or itchy/watery eyes Hematological and Lymphatic ROS: negative for - bleeding problems, bruising or swollen lymph nodes Endocrine ROS: negative for - galactorrhea, hair pattern changes, polydipsia/polyuria or temperature intolerance  Respiratory ROS: negative for - cough, hemoptysis, shortness of breath or wheezing Cardiovascular ROS: negative for - chest [name], dyspnea on exertion, edema or irregular heartbeat Gastrointestinal ROS: negative for - abdominal [name], diarrhea, hematemesis, nausea/vomiting or stool incontinence Genito-Urinary ROS: negative for - dysuria, hematuria, incontinence or urinary frequency/urgency Musculoskeletal ROS: negative for - joint swelling or muscular weakness Neurological ROS: as noted in HPI Dermatological ROS: negative for rash and skin lesion  changes  Physical Examination: Blood pressure (!) 159/106, pulse 72, temperature 98.4 F (36.9 C), temperature source Oral, resp. rate 18, height 5\' 11"  (1.803 m), weight 88.5 kg (195 lb), SpO2 95 %.  HEENT-  Normocephalic, no lesions, without obvious abnormality.  Normal external eye and conjunctiva.  Normal TM's bilaterally.  Normal auditory canals and external ears. Normal external nose, mucus membranes and septum.  Normal pharynx. Cardiovascular- S1, S2 normal, pulses palpable throughout   Lungs- chest clear, no wheezing, rales, normal symmetric air entry Abdomen- soft, non-tender; bowel sounds normal; no masses,  no organomegaly Extremities- no edema Lymph-no adenopathy palpable Musculoskeletal-no joint tenderness, deformity or swelling Skin-warm and dry, no hyperpigmentation, vitiligo, or suspicious lesions  Neurological Examination Mental Status: Alert, oriented, thought content appropriate.  Speech fluent without evidence of aphasia.  Able to follow 3 step commands without difficulty. Cranial Nerves: II: Discs flat bilaterally; Visual fields grossly normal, pupils equal, round, reactive to light and accommodation III,IV, VI: ptosis not present, extra-ocular motions intact bilaterally V,VII: smile symmetric, facial light touch sensation normal bilaterally VIII: hearing normal bilaterally IX,X: gag reflex present XI: bilateral shoulder shrug XII: midline tongue extension Motor: Right : Upper extremity   5/5    Left:     Upper extremity   5-/5 with pronator drift  Lower extremity   5/5     Lower extremity   5/5 Tone and bulk:normal tone throughout; no atrophy noted Sensory: Pinprick and light touch decreased on the left upper and lower extremity Deep Tendon Reflexes: 2+ and symmetric throughout Plantars: Right: downgoing   Left: upgoing Cerebellar: Dysmetria with finger-to-nose and heel-to-shin testing on the left Gait: left lower extremity external rotation with gait.  Poor  balance.    Laboratory Studies:  Basic Metabolic Panel:  Recent Labs Lab 12/29/15 0823  NA 135  K 4.0  CL 102  CO2 24  GLUCOSE 231*  BUN 12  CREATININE 0.90  CALCIUM 9.4    Liver Function Tests:  Recent Labs Lab 12/29/15 0823  AST 18  ALT 13*  ALKPHOS 91  BILITOT 0.9  PROT 7.9  ALBUMIN 4.2   No results for input(s): LIPASE, AMYLASE in the last 168 hours. No results for input(s): AMMONIA in the last 168 hours.  CBC:  Recent Labs Lab 12/29/15 0823  WBC 7.9  NEUTROABS 3.9  HGB 17.9  HCT 51.3  MCV 82.0  PLT 273    Cardiac Enzymes:  Recent Labs Lab 12/29/15 0823 12/29/15 1205  TROPONINI 0.06* 0.06*    BNP: Invalid input(s): POCBNP  CBG: No results for input(s): GLUCAP in the last 168 hours.  Microbiology: No results found for this or any previous visit.  Coagulation Studies:  Recent Labs  12/29/15 0823  LABPROT 12.0  INR 0.89    Urinalysis:  Recent Labs Lab 12/29/15 0823  COLORURINE COLORLESS*  LABSPEC 1.001*  PHURINE 6.0  GLUCOSEU >=500*  HGBUR SMALL*  BILIRUBINUR NEGATIVE  KETONESUR NEGATIVE  PROTEINUR NEGATIVE  NITRITE NEGATIVE  LEUKOCYTESUR NEGATIVE    Lipid Panel:    Component Value  Date/Time   CHOL 174 07/30/2014 0902   TRIG 162 (H) 07/30/2014 0902   HDL 31 (L) 07/30/2014 0902   CHOLHDL 5.6 (H) 07/30/2014 0902   LDLCALC 111 (H) 07/30/2014 0902    HgbA1C:  Lab Results  Component Value Date   HGBA1C 7.6% 06/30/2015    Urine Drug Screen:     Component Value Date/Time   LABOPIA NONE DETECTED 12/29/2015 0823   COCAINSCRNUR NONE DETECTED 12/29/2015 0823   LABBENZ NONE DETECTED 12/29/2015 0823   AMPHETMU NONE DETECTED 12/29/2015 0823   THCU NONE DETECTED 12/29/2015 0823   LABBARB NONE DETECTED 12/29/2015 0823    Alcohol Level: No results for input(s): ETH in the last 168 hours.  Other results: EKG: sinus rhythm at 81 bpm.  Imaging: Dg Chest 1 View  Result Date: 12/29/2015 CLINICAL DATA:  Left arm and  leg numbness beginning last night at 9 p.m. Cough and congestion for 1 week. EXAM: CHEST 1 VIEW COMPARISON:  PA and lateral chest 06/19/2015. FINDINGS: Lungs clear. Heart size normal. No pneumothorax or pleural fluid. No bony abnormality. IMPRESSION: Normal chest. Electronically Signed   By: Drusilla Kannerhomas  Dalessio M.D.   On: 12/29/2015 09:19   Ct Head Wo Contrast  Result Date: 12/29/2015 CLINICAL DATA:  Left-sided numbness 12 hours duration. Unsteady gait. Code stroke. EXAM: CT HEAD WITHOUT CONTRAST TECHNIQUE: Contiguous axial images were obtained from the base of the skull through the vertex without intravenous contrast. COMPARISON:  None. FINDINGS: Brain: No evidence of malformation, atrophy, old or acute small or large vessel infarction, mass lesion, hemorrhage, hydrocephalus or extra-axial collection. No evidence of pituitary lesion. Vascular: No vascular calcification.  No hyperdense vessels. Skull: Normal.  No fracture or focal bone lesion. Sinuses/Orbits: Visualized sinuses are clear. No fluid in the middle ears or mastoids. Visualized orbits are normal. Other: None significant IMPRESSION: Normal head CT These results were called by telephone at the time of interpretation on 12/29/2015 at 9:17 am to Dr. Willy EddyPATRICK ROBINSON , who verbally acknowledged these results. Electronically Signed   By: Paulina FusiMark  Shogry M.D.   On: 12/29/2015 09:19   Mr Brain Wo Contrast  Result Date: 12/29/2015 CLINICAL DATA:  LEFT-sided numbness. Difficulty walking. Symptom onset approximately 9 p.m. 12/28/2015. Stroke risk factors include type 2 diabetes, and hypertension EXAM: MRI HEAD WITHOUT CONTRAST TECHNIQUE: Multiplanar, multiecho pulse sequences of the brain and surrounding structures were obtained without intravenous contrast. COMPARISON:  CT head earlier today. FINDINGS: Brain: Tubular area of restricted diffusion affects the posterior limb internal capsule, centrum semiovale, and posterior most aspect of the lentiform nucleus  consistent with acute infarction. This is not readily visible on earlier CT. No hemorrhage, mass lesion, hydrocephalus, or extra-axial fluid. No other similar abnormalities. Normal for age cerebral volume. No significant white matter disease. Vascular: Flow voids are maintained throughout the carotid, basilar, and vertebral arteries. There are no areas of chronic hemorrhage. Skull and upper cervical spine: Unremarkable visualized calvarium, skullbase, and cervical vertebrae. Empty sella with expansion, uncertain significance. Pineal, and cerebellar tonsils unremarkable. No upper cervical cord lesions. Sinuses/Orbits: No orbital masses or proptosis. Globes appear symmetric. Sinuses appear well aerated, without evidence for air-fluid level. Other: None. IMPRESSION: Nonhemorrhagic RIGHT-sided deep white matter infarct also affecting posterior lentiform nucleus would correlate with LEFT-sided symptoms. This is nonhemorrhagic. No other acute or focal intracranial abnormalities. No proximal large vessel occlusion is established. Electronically Signed   By: Elsie StainJohn T Curnes M.D.   On: 12/29/2015 11:56   [place] Carotid Bilateral  Result Date: 12/29/2015  CLINICAL DATA:  TIA.  Left arm and leg weakness. EXAM: BILATERAL CAROTID DUPLEX ULTRASOUND TECHNIQUE: Wallace Cullens scale imaging, color Doppler and duplex ultrasound were performed of bilateral carotid and vertebral arteries in the neck. COMPARISON:  None. FINDINGS: Criteria: Quantification of carotid stenosis is based on velocity parameters that correlate the residual internal carotid diameter with NASCET-based stenosis levels, using the diameter of the distal internal carotid lumen as the denominator for stenosis measurement. The following velocity measurements were obtained: RIGHT ICA:  76 cm/sec CCA:  84 cm/sec SYSTOLIC ICA/CCA RATIO:  0.9 DIASTOLIC ICA/CCA RATIO:  1.6 ECA:  69 cm/sec LEFT ICA:  62 cm/sec CCA:  98 cm/sec SYSTOLIC ICA/CCA RATIO:  0.8 DIASTOLIC ICA/CCA RATIO:  1.3  ECA:  78 cm/sec RIGHT CAROTID ARTERY: Mild plaque in the internal carotid artery. Normal waveforms and velocities in the internal carotid artery. External carotid artery is patent with normal waveform. RIGHT VERTEBRAL ARTERY: Antegrade flow and normal waveform in the right vertebral artery. LEFT CAROTID ARTERY: External carotid artery is patent with normal waveform. Small amount of heterogeneous plaque in the left internal carotid artery. Normal waveforms and velocities in the internal carotid artery. LEFT VERTEBRAL ARTERY: Antegrade flow and normal waveform in the left vertebral artery. IMPRESSION: Mild atherosclerotic disease in the internal carotid arteries bilaterally. Estimated degree of stenosis in the internal carotid arteries is less than 50% bilaterally. Patent vertebral arteries with antegrade flow. Electronically Signed   By: Richarda Overlie M.D.   On: 12/29/2015 12:02    Assessment: 50 y.o. male with a history of DM and HTN who presents with left sided numbness and incoordination.  MRI of the brain reviewed and shows an acute right deep white matter infarct.  Patient on ASA at home.  With multiple risk factors.  Infarct likely secondary to small vessel disease.  Carotid dopplers show no evidence of hemodynamically significant stenosis.  Echocardiogram pending.  A1c for today pending.  In June 7.6.  LDL pending.  Patient on statin.  Stroke Risk Factors - diabetes mellitus, hyperlipidemia, hypertension and smoking  Plan: 1. HgbA1c, fasting lipid panel pending 2. PT consult, OT consult, Speech consult 3. Prophylactic therapy-Antiplatelet med: Plavix - dose 75mg  daily 4. Telemetry monitoring 5. Frequent neuro checks 6. Patient on statin.  LDL goal <70. 7. BP control 8. Smoking cessation counseling 9. Blood sugar management   Thana Farr, MD Neurology [phone] 12/29/2015, 2:38 PM

## 2015-12-29 NOTE — ED Notes (Signed)
Pt to ultrasound; they are going to transport to MRI afterwards; then pt will go to room 118

## 2015-12-29 NOTE — ED Triage Notes (Signed)
[facility] with a complaint of left arm and left leg weakness since 9pm yesterday.  Patient is alert and oriented without slurred speech.  No obvious distress at this time.

## 2015-12-29 NOTE — ED Notes (Signed)
Pt presents with slightly impaired gait; states he began to feel numbness in his left arm last night at 2100. States that he experienced some change to his walking this morning; not better or worse at this time. Pt states he has lisinopril and aspirin but takes them "sometimes." Pt alert & oriented; denies pain, NAD noted.

## 2015-12-29 NOTE — ED Provider Notes (Signed)
[facility] Emergency Department Provider Note    None    (approximate)  I have reviewed the triage vital signs and the nursing notes.   HISTORY  Chief Complaint Extremity Weakness    HPI Kenneth [name] is a 50 y.o. male  history of CAD, hypertension, diabetes and hyperlipidemia who presents with acute left-sided numbness and difficulty ambulating that started last night at 9 PM area patient denies any headache, shortness of breath or chest pain. Patient went to sleep and woke up with persistent symptoms. There's been no change in the symptoms. Denies any neck pain. No visual disturbance. No dysarthria. Patient initially went to [place] [facility] was directed to the ER due to concern for stroke.   Past Medical History:  Diagnosis Date  . Coronary artery disease   . Diabetes mellitus without complication (HCC)   . Hypertension    Family History  Problem Relation Age of Onset  . Heart attack Mother   . Heart failure Mother   . Coronary artery disease Father   . Diabetes Father    Past Surgical History:  Procedure Laterality Date  . CORONARY ANGIOPLASTY WITH STENT PLACEMENT  06/18/2008  . CORONARY ANGIOPLASTY WITH STENT PLACEMENT     Patient Active Problem List   Diagnosis Date Noted  . TIA (transient ischemic attack) 12/29/2015  . Hypertension 06/30/2015  . Arteriosclerosis of coronary artery 07/28/2014  . Type 2 diabetes mellitus with hyperglycemia (HCC) 07/28/2014  . Esophageal reflux 07/28/2014  . HLD (hyperlipidemia) 09/10/2006      Prior to Admission medications   Medication Sig Start Date End Date Taking? Authorizing Provider  atorvastatin (LIPITOR) 80 MG tablet Take 1 tablet (80 mg total) by mouth daily. 06/30/15  Yes Anola Gurneyobert Chauvin, PA  buPROPion (WELLBUTRIN SR) 100 MG 12 hr tablet Take 100 mg by mouth 2 (two) times daily.  06/29/15 06/28/16 Yes Historical Provider, MD  lisinopril (PRINIVIL,ZESTRIL) 10 MG tablet Take 10 mg  by mouth 2 (two) times daily. Two tablets in the morning and 1 tablet in the evening 06/29/15  Yes Historical Provider, MD  metFORMIN (GLUCOPHAGE) 1000 MG tablet Take 1,000 mg by mouth 2 (two) times daily with a meal.   Yes Historical Provider, MD    Allergies Patient has no known allergies.    Social History Social History  Substance Use Topics  . Smoking status: Heavy Tobacco Smoker    Packs/day: 2.00    Types: Cigarettes  . Smokeless tobacco: Never Used  . Alcohol use No     Comment: OCCASIONALLY    Review of Systems Patient denies headaches, rhinorrhea, blurry vision, numbness, shortness of breath, chest pain, edema, cough, abdominal pain, nausea, vomiting, diarrhea, dysuria, fevers, rashes or hallucinations unless otherwise stated above in HPI. ____________________________________________   PHYSICAL EXAM:  VITAL SIGNS: Vitals:   12/29/15 1417 12/29/15 1425  BP: (!) 164/94 (!) 159/106  Pulse: 72   Resp: 18   Temp: 98.4 F (36.9 C)     Constitutional: Alert and oriented. Well appearing and in no acute distress. Eyes: Conjunctivae are normal. PERRL. EOMI. Head: Atraumatic. Nose: No congestion/rhinnorhea. Mouth/Throat: Mucous membranes are moist.  Oropharynx non-erythematous. Neck: No stridor. Painless ROM. No cervical spine tenderness to palpation Hematological/Lymphatic/Immunilogical: No cervical lymphadenopathy. Cardiovascular: Normal rate, regular rhythm. Grossly normal heart sounds.  Good peripheral circulation. Respiratory: Normal respiratory effort.  No retractions. Lungs CTAB. Gastrointestinal: Soft and nontender. No distention. No abdominal bruits. No CVA tenderness.  Musculoskeletal: No lower extremity  tenderness nor edema.  No joint effusions. Neurologic: CN- intact.  No facial droop, Normal FNF.  Difficulty with LLE heel to shin testing,.  Sensation decreased to LUE and LLE. Normal speech and language. No gross focal neurologic deficits are appreciated.  unsteady gait Skin:  Skin is warm, dry and intact. No rash noted. Psychiatric: Mood and affect are normal. Speech and behavior are normal.  ____________________________________________   LABS (all labs ordered are listed, but only abnormal results are displayed)  Results for orders placed or performed during the hospital encounter of 12/29/15 (from the past 24 hour(s))  Protime-INR     Status: None   Collection Time: 12/29/15  8:23 AM  Result Value Ref Range   Prothrombin Time 12.0 11.4 - 15.2 seconds   INR 0.89   APTT     Status: Abnormal   Collection Time: 12/29/15  8:23 AM  Result Value Ref Range   aPTT 41 (H) 24 - 36 seconds  CBC     Status: Abnormal   Collection Time: 12/29/15  8:23 AM  Result Value Ref Range   WBC 7.9 3.8 - 10.6 K/uL   RBC 6.25 (H) 4.40 - 5.90 MIL/uL   Hemoglobin 17.9 13.0 - 18.0 g/dL   HCT 16.1 09.6 - 04.5 %   MCV 82.0 80.0 - 100.0 fL   MCH 28.7 26.0 - 34.0 pg   MCHC 35.0 32.0 - 36.0 g/dL   RDW 40.9 81.1 - 91.4 %   Platelets 273 150 - 440 K/uL  Differential     Status: None   Collection Time: 12/29/15  8:23 AM  Result Value Ref Range   Neutrophils Relative % 50 %   Neutro Abs 3.9 1.4 - 6.5 K/uL   Lymphocytes Relative 39 %   Lymphs Abs 3.1 1.0 - 3.6 K/uL   Monocytes Relative 9 %   Monocytes Absolute 0.7 0.2 - 1.0 K/uL   Eosinophils Relative 1 %   Eosinophils Absolute 0.1 0 - 0.7 K/uL   Basophils Relative 1 %   Basophils Absolute 0.1 0 - 0.1 K/uL  Comprehensive metabolic panel     Status: Abnormal   Collection Time: 12/29/15  8:23 AM  Result Value Ref Range   Sodium 135 135 - 145 mmol/L   Potassium 4.0 3.5 - 5.1 mmol/L   Chloride 102 101 - 111 mmol/L   CO2 24 22 - 32 mmol/L   Glucose, Bld 231 (H) 65 - 99 mg/dL   BUN 12 6 - 20 mg/dL   Creatinine, Ser 7.82 0.61 - 1.24 mg/dL   Calcium 9.4 8.9 - 95.6 mg/dL   Total Protein 7.9 6.5 - 8.1 g/dL   Albumin 4.2 3.5 - 5.0 g/dL   AST 18 15 - 41 U/L   ALT 13 (L) 17 - 63 U/L   Alkaline Phosphatase 91  38 - 126 U/L   Total Bilirubin 0.9 0.3 - 1.2 mg/dL   GFR calc non Af Amer >60 >60 mL/min   GFR calc Af Amer >60 >60 mL/min   Anion gap 9 5 - 15  Troponin I     Status: Abnormal   Collection Time: 12/29/15  8:23 AM  Result Value Ref Range   Troponin I 0.06 (HH) <0.03 ng/mL  Urine Drug Screen, Qualitative ([facility] only)     Status: None   Collection Time: 12/29/15  8:23 AM  Result Value Ref Range   Tricyclic, Ur Screen NONE DETECTED NONE DETECTED   Amphetamines, Ur Screen NONE DETECTED NONE  DETECTED   MDMA (Ecstasy)Ur Screen NONE DETECTED NONE DETECTED   Cocaine Metabolite,Ur [place] NONE DETECTED NONE DETECTED   Opiate, Ur Screen NONE DETECTED NONE DETECTED   Phencyclidine (PCP) Ur S NONE DETECTED NONE DETECTED   Cannabinoid 50 Ng, Ur [place] NONE DETECTED NONE DETECTED   Barbiturates, Ur Screen NONE DETECTED NONE DETECTED   Benzodiazepine, Ur Scrn NONE DETECTED NONE DETECTED   Methadone Scn, Ur NONE DETECTED NONE DETECTED  Urinalysis, Complete w Microscopic     Status: Abnormal   Collection Time: 12/29/15  8:23 AM  Result Value Ref Range   Color, Urine COLORLESS (A) YELLOW   APPearance CLEAR (A) CLEAR   Specific Gravity, Urine 1.001 (L) 1.005 - 1.030   pH 6.0 5.0 - 8.0   Glucose, UA >=500 (A) NEGATIVE mg/dL   Hgb urine dipstick SMALL (A) NEGATIVE   Bilirubin Urine NEGATIVE NEGATIVE   Ketones, ur NEGATIVE NEGATIVE mg/dL   Protein, ur NEGATIVE NEGATIVE mg/dL   Nitrite NEGATIVE NEGATIVE   Leukocytes, UA NEGATIVE NEGATIVE   RBC / HPF 0-5 0 - 5 RBC/hpf   WBC, UA NONE SEEN 0 - 5 WBC/hpf   Bacteria, UA NONE SEEN NONE SEEN   Squamous Epithelial / LPF 0-5 (A) NONE SEEN   Mucous PRESENT   Troponin I (q 6hr x 3)     Status: Abnormal   Collection Time: 12/29/15 12:05 PM  Result Value Ref Range   Troponin I 0.06 (HH) <0.03 ng/mL   ____________________________________________  EKG My review and personal interpretation at Time: 8:27   Indication: cva  Rate: 80  Rhythm: sinus Axis:  normal Other: normal intervals, non specific st changes, no acute ischema ____________________________________________  RADIOLOGY  I personally reviewed all radiographic images ordered to evaluate for the above acute complaints and reviewed radiology reports and findings.  These findings were personally discussed with the patient.  Please see medical record for radiology report.  ____________________________________________   PROCEDURES  Procedure(s) performed: none Procedures    Critical Care performed: no ____________________________________________   INITIAL IMPRESSION / ASSESSMENT AND PLAN / ED COURSE  Pertinent labs & imaging results that were available during my care of the patient were reviewed by me and considered in my medical decision making (see chart for details).  DDX:cva, tia, acs, dehydration, hypertensive emergency, mass, dissection  Kenneth [name] is a 50 y.o. who presents to the ED with acute left-sided numbness and left-sided coordination deficits as described above. Patient afebrile mildly hypertensive but otherwise well-perfused. Exam as described above. Patient is outside of the window for TPA. Based on my concern for CVA patient taken emergently to CT scanning.  Clinical Course as of Dec 28 1557  Thu Dec 29, 2015  [id number] CT head with NAICA.  Will page Neurology.  Mild troponin elevation to 0.06.  Patient chest pain free.  [PR]  [phone] I spoke with Dr Luberta MutterKonidena regarding my concerns for CVA.  REviewed results with patient.  Patient will be admitted for further evaluation and management.  Have discussed with the patient and available family all diagnostics and treatments performed thus far and all questions were answered to the best of my ability. The patient demonstrates understanding and agreement with plan.   [PR]    Clinical Course User Index [PR] Willy EddyPatrick Lisl Slingerland, MD     ____________________________________________   FINAL CLINICAL IMPRESSION(S) / ED  DIAGNOSES  Final diagnoses:  Left sided numbness  Unsteady gait  Cerebrovascular accident (CVA), unspecified mechanism (HCC)  NEW MEDICATIONS STARTED DURING THIS VISIT:  Current Discharge Medication List       Note:  This document was prepared using Dragon voice recognition software and may include unintentional dictation errors.    Willy Eddy, MD 12/29/15 [phone]

## 2015-12-29 NOTE — H&P (Signed)
[facility] [place] [facility]   PATIENT NAME: Kenneth [name]    MR#:  [id number]  DATE OF BIRTH:  08/05/1965   DATE OF ADMISSION:  12/29/2015  PRIMARY CARE PHYSICIAN: Anola Gurneyobert Chauvin, PA   REQUESTING/REFERRING PHYSICIAN: robinson  CHIEF COMPLAINT:   Chief Complaint  Patient presents with  . Extremity Weakness    HISTORY OF PRESENT ILLNESS:  Kenneth [name]  is a 50 y.o. male with a known history of type 2 diabetes without insulin presenting with left sided numbness. Acute onset 9pm day before presentation. "like its asleep" no weakness but difficulty walking. No further symptoms  PAST MEDICAL HISTORY:   Past Medical History:  Diagnosis Date  . Coronary artery disease   . Diabetes mellitus without complication (HCC)   . Hypertension     PAST SURGICAL HISTORY:   Past Surgical History:  Procedure Laterality Date  . CORONARY ANGIOPLASTY WITH STENT PLACEMENT  06/18/2008  . CORONARY ANGIOPLASTY WITH STENT PLACEMENT      SOCIAL HISTORY:   Social History  Substance Use Topics  . Smoking status: Heavy Tobacco Smoker    Packs/day: 2.00    Types: Cigarettes  . Smokeless tobacco: Never Used  . Alcohol use No     Comment: OCCASIONALLY    FAMILY HISTORY:   Family History  Problem Relation Age of Onset  . Heart attack Mother   . Heart failure Mother   . Coronary artery disease Father   . Diabetes Father     DRUG ALLERGIES:  No Known Allergies  REVIEW OF SYSTEMS:  REVIEW OF SYSTEMS:  CONSTITUTIONAL: Denies fevers, chills, fatigue, weakness.  EYES: Denies blurred vision, double vision, or eye pain.  EARS, NOSE, THROAT: Denies tinnitus, ear pain, hearing loss.  RESPIRATORY: denies cough, shortness of breath, wheezing  CARDIOVASCULAR: Denies chest pain, palpitations, edema.  GASTROINTESTINAL: Denies nausea, vomiting, diarrhea, abdominal pain.  GENITOURINARY: Denies dysuria, hematuria.  ENDOCRINE: Denies nocturia or thyroid problems. HEMATOLOGIC  AND LYMPHATIC: Denies easy bruising or bleeding.  SKIN: Denies rash or lesions.  MUSCULOSKELETAL: Denies pain in neck, back, shoulder, knees, hips, or further arthritic symptoms.  NEUROLOGIC: Denies paralysis, positive paresthesias.  PSYCHIATRIC: Denies anxiety or depressive symptoms. Otherwise full review of systems performed by me is negative.   MEDICATIONS AT HOME:   Prior to Admission medications   Medication Sig Start Date End Date Taking? Authorizing Provider  atorvastatin (LIPITOR) 80 MG tablet Take 1 tablet (80 mg total) by mouth daily. 06/30/15   Anola Gurneyobert Chauvin, PA  buPROPion (WELLBUTRIN SR) 100 MG 12 hr tablet Take by mouth. 06/29/15 06/28/16  Historical Provider, MD  lisinopril (PRINIVIL,ZESTRIL) 10 MG tablet Take 10 mg by mouth 2 (two) times daily. Two pills twice daily 06/29/15   Historical Provider, MD  metFORMIN (GLUCOPHAGE) 1000 MG tablet Take 1,000 mg by mouth 2 (two) times daily with a meal.    Historical Provider, MD      VITAL SIGNS:  Blood pressure (!) 169/126, pulse 82, temperature 98.3 F (36.8 C), resp. rate 16, height 5\' 11"  (1.803 m), weight 88.5 kg (195 lb), SpO2 97 %.  PHYSICAL EXAMINATION:  VITAL SIGNS: Vitals:   12/29/15 0821 12/29/15 0945  BP: (!) 169/126   Pulse: 82   Resp: 16   Temp: 98.3 F (36.8 C) 98.3 F (36.8 C)   GENERAL:50 y.o.male currently in no acute distress.  HEAD: Normocephalic, atraumatic.  EYES: Pupils equal, round, reactive to light. Extraocular muscles intact. No scleral icterus.  MOUTH: Moist  mucosal membrane. Dentition intact. No abscess noted.  EAR, NOSE, THROAT: Clear without exudates. No external lesions.  NECK: Supple. No thyromegaly. No nodules. No JVD.  PULMONARY: Clear to ascultation, without wheeze rails or rhonci. No use of accessory muscles, Good respiratory effort. good air entry bilaterally CHEST: Nontender to palpation.  CARDIOVASCULAR: S1 and S2. Regular rate and rhythm. No murmurs, rubs, or gallops. No edema. Pedal  pulses 2+ bilaterally.  GASTROINTESTINAL: Soft, nontender, nondistended. No masses. Positive bowel sounds. No hepatosplenomegaly.  MUSCULOSKELETAL: No swelling, clubbing, or edema. Range of motion full in all extremities.  NEUROLOGIC: Cranial nerves II through XII are intact. No gross focal neurological deficits. Sensation diminished left side. Reflexes intact.  Strength 5/5 SKIN: No ulceration, lesions, rashes, or cyanosis. Skin warm and dry. Turgor intact.  PSYCHIATRIC: Mood, affect within normal limits. The patient is awake, alert and oriented x 3. Insight, judgment intact.    LABORATORY PANEL:   CBC  Recent Labs Lab 12/29/15 0823  WBC 7.9  HGB 17.9  HCT 51.3  PLT 273   ------------------------------------------------------------------------------------------------------------------  Chemistries   Recent Labs Lab 12/29/15 0823  NA 135  K 4.0  CL 102  CO2 24  GLUCOSE 231*  BUN 12  CREATININE 0.90  CALCIUM 9.4  AST 18  ALT 13*  ALKPHOS 91  BILITOT 0.9   ------------------------------------------------------------------------------------------------------------------  Cardiac Enzymes  Recent Labs Lab 12/29/15 0823  TROPONINI 0.06*   ------------------------------------------------------------------------------------------------------------------  RADIOLOGY:  Dg Chest 1 View  Result Date: 12/29/2015 CLINICAL DATA:  Left arm and leg numbness beginning last night at 9 p.m. Cough and congestion for 1 week. EXAM: CHEST 1 VIEW COMPARISON:  PA and lateral chest 06/19/2015. FINDINGS: Lungs clear. Heart size normal. No pneumothorax or pleural fluid. No bony abnormality. IMPRESSION: Normal chest. Electronically Signed   By: Drusilla Kannerhomas  Dalessio M.D.   On: 12/29/2015 09:19   Ct Head Wo Contrast  Result Date: 12/29/2015 CLINICAL DATA:  Left-sided numbness 12 hours duration. Unsteady gait. Code stroke. EXAM: CT HEAD WITHOUT CONTRAST TECHNIQUE: Contiguous axial images were  obtained from the base of the skull through the vertex without intravenous contrast. COMPARISON:  None. FINDINGS: Brain: No evidence of malformation, atrophy, old or acute small or large vessel infarction, mass lesion, hemorrhage, hydrocephalus or extra-axial collection. No evidence of pituitary lesion. Vascular: No vascular calcification.  No hyperdense vessels. Skull: Normal.  No fracture or focal bone lesion. Sinuses/Orbits: Visualized sinuses are clear. No fluid in the middle ears or mastoids. Visualized orbits are normal. Other: None significant IMPRESSION: Normal head CT These results were called by telephone at the time of interpretation on 12/29/2015 at 9:17 am to Dr. Willy EddyPATRICK ROBINSON , who verbally acknowledged these results. Electronically Signed   By: Paulina FusiMark  Shogry M.D.   On: 12/29/2015 09:19    EKG:   Orders placed or performed during the hospital encounter of 12/29/15  . ED EKG  . ED EKG  . EKG 12-Lead  . EKG 12-Lead    IMPRESSION AND PLAN:   50 year old male history of DM, HTN presenting with left sided numbness  1. TIA, unspecified: Initiate aspirin/statin therapy, stroke protocol/precautions, place on telemetry, trend cardiac enzymes, check MRI brain, complete echocardiogram, lipid panel, hemoglobin A1c, bilateral carotid Doppler, permissive hypertension first 24 hours treating blood pressure only greater than 220/120   2. Type 2 diabetes: hold oral agents, add sliding coverage 3. Hyperlipidemia unspecified: statin 4. htn essential: permissive as above 5. eelvated troponin: expected in setting of stroke, trend  All the records are reviewed and case discussed with ED provider. Management plans discussed with the patient, family and they are in agreement.  CODE STATUS: full  TOTAL TIME TAKING CARE OF THIS PATIENT: 33 minutes.    Avon Mergenthaler,  Mardi Mainland.D on 12/29/2015 at 10:17 AM  Between 7am to 6pm - Pager - [phone]  After 6pm: House Pager: - [phone]  [facility]  Office  [phone]  CC: Primary care physician; Anola Gurney, PA

## 2015-12-29 NOTE — Evaluation (Signed)
Physical Therapy Evaluation Patient Details Name: Kenneth CluckJimmy R Amodio MRN: [id number] DOB: 10/01/1965 Today's Date: 12/29/2015   History of Present Illness  Pt is a 50 y.o. male presenting to hospital with L sided numbness and difficulty walking.  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  PMH includes DM type 2, CAD, htn.  Clinical Impression  Prior to hospital admission, pt was independent and working full time in [employer].  Pt lives with his wife with bedroom on 2nd floor.  Currently pt is independent with bed mobility, CGA with transfers, and min assist with ambulation without AD.  With gait, pt demonstrating narrow BOS; L LE intermittently scissoring causing occasional loss of balance requiring assist to steady; L LE externally rotated; decreased L heelstrike with intermittent shuffling to advance L LE; L knee intermittently flexed but no buckling noted; increased L lateral sway.  Anticipate pt's gait and balance will improve with use of AD (will trial cane vs RW next session as appropriate).  Overall pt demonstrating delayed responses with L LE testing.   Pt would benefit from skilled PT to address noted impairments and functional limitations.  Currently recommend pt discharge to home with SBA with functional mobility for safety and OP PT when medically appropriate.     Follow Up Recommendations Outpatient PT;Supervision for mobility/OOB    Equipment Recommendations   (TBD next session.)    Recommendations for Other Services       Precautions / Restrictions Precautions Precautions: Fall Restrictions Weight Bearing Restrictions: No      Mobility  Bed Mobility Overal bed mobility: Independent             General bed mobility comments: Supine to sit without any difficulty.  Transfers Overall transfer level: Needs assistance Equipment used: None Transfers: Sit to/from Stand Sit to Stand: Min guard          General transfer comment: single UE support standing to get balance; strong stand and safe sitting technique  Ambulation/Gait Ambulation/Gait assistance: Min assist Ambulation Distance (Feet): 120 Feet Assistive device: None   Gait velocity: mildly decreased   General Gait Details: Narrow BOS; L LE intermittently scissoring causing occasional loss of balance requiring assist to steady; L LE externally rotate; decreased L heelstrike with intermittent shuffling to advance L LE; L knee intermittently flexed but no buckling noted; increased L lateral sway  Stairs            Wheelchair Mobility    Modified Rankin (Stroke Patients Only)       Balance Overall balance assessment: Needs assistance Sitting-balance support: No upper extremity supported;Feet supported Sitting balance-Leahy Scale: Normal     Standing balance support: No upper extremity supported Standing balance-Leahy Scale: Fair Standing balance comment: static standing                             Pertinent Vitals/Pain Pain Assessment: No/denies pain  Pt's BP increased from 165/92 at rest to 159/106 post ambulation (nursing notified immediately).  HR and O2 on room air WFL during session.    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     Hand Dominance        Extremity/Trunk Assessment   Upper Extremity Assessment: Defer to OT evaluation           Lower Extremity Assessment: RLE deficits/detail;LLE deficits/detail RLE Deficits / Details: strength, ROM, coordination,  sensation, and proprioception WFL LLE Deficits / Details: hip flexion 4+/5; knee flexion/extension 5/5; DF 4+/5; decreased coordination L LE heel to shin; decreased L great toe proprioception; decreased L LE sensation; slight catch with quick stretch to L heelcord  Cervical / Trunk Assessment: Normal  Communication   Communication: No difficulties  Cognition Arousal/Alertness: Awake/alert Behavior During Therapy: WFL for tasks assessed/performed Overall Cognitive Status: Within Functional Limits for tasks assessed                      General Comments General comments (skin integrity, edema, etc.): Pt's wife present during session.  Nursing cleared pt for participation in physical therapy.  Pt agreeable to PT session.    Exercises     Assessment/Plan    PT Assessment Patient needs continued PT services  PT Problem List Decreased strength;Decreased balance;Decreased mobility;Decreased coordination;Decreased knowledge of use of DME;Decreased knowledge of precautions;Impaired sensation          PT Treatment Interventions DME instruction;Gait training;Stair training;Functional mobility training;Therapeutic activities;Therapeutic exercise;Balance training;Neuromuscular re-education;Patient/family education    PT Goals (Current goals can be found in the Care Plan section)  Acute Rehab PT Goals Patient Stated Goal: to go home PT Goal Formulation: With patient Time For Goal Achievement: 01/12/16 Potential to Achieve Goals: Good    Frequency 7X/week   Barriers to discharge        Co-evaluation               End of Session Equipment Utilized During Treatment: Gait belt Activity Tolerance: Patient tolerated treatment well Patient left: with call bell/phone within reach;with bed alarm set;with family/visitor present (sitting on edge of bed with neurologist present) Nurse Communication: Mobility status;Precautions (Pt's BP 159/106 end of session)    Functional  Assessment Tool Used: AM-PAC without stairs Functional Limitation: Mobility: Walking and moving around Mobility: Walking and Moving Around Current Status ([id number](G8978): At least 20 percent but less than 40 percent impaired, limited or restricted Mobility: Walking and Moving Around Goal Status [phone](G8979): At least 1 percent but less than 20 percent impaired, limited or restricted    Time: [id number] PT Time Calculation (min) (ACUTE ONLY): 15 min   Charges:   PT Evaluation $PT Eval Low Complexity: 1 Procedure     PT G Codes:   PT G-Codes **NOT FOR INPATIENT CLASS** Functional Assessment Tool Used: AM-PAC without stairs Functional Limitation: Mobility: Walking and moving around Mobility: Walking and Moving Around Current Status (N8295(G8978): At least 20 percent but less than 40 percent impaired, limited or restricted Mobility: Walking and Moving Around Goal Status [phone](G8979): At least 1 percent but less than 20 percent impaired, limited or restricted    Hendricks Limesmily Jaun Galluzzo 12/29/2015, 2:55 PM Hendricks LimesEmily Chamille Werntz, PT [phone]

## 2015-12-30 ENCOUNTER — Telehealth: Payer: Self-pay

## 2015-12-30 DIAGNOSIS — I63211 Cerebral infarction due to unspecified occlusion or stenosis of right vertebral arteries: Secondary | ICD-10-CM | POA: Diagnosis present

## 2015-12-30 LAB — LIPID PANEL
CHOLESTEROL: 215 mg/dL — AB (ref 0–200)
LDL Cholesterol: UNDETERMINED mg/dL (ref 0–99)
TRIGLYCERIDES: 2631 mg/dL — AB (ref <150)
VLDL: UNDETERMINED mg/dL (ref 0–40)

## 2015-12-30 LAB — ECHOCARDIOGRAM COMPLETE
CHL CUP MV DEC (S): 225
E decel time: 225 msec
E/e' ratio: 12.82
FS: 13 % — AB (ref 28–44)
HEIGHTINCHES: 71 in
IVS/LV PW RATIO, ED: 0.93
LA ID, A-P, ES: 40 mm
LA diam end sys: 40 mm
LA diam index: 1.89 cm/m2
LA vol index: 20.3 mL/m2
LA vol: 43.1 mL
LAVOLA4C: 32.8 mL
LV E/e'average: 12.82
LV TDI E'LATERAL: 5.22
LV dias vol index: 93 mL/m2
LVDIAVOL: 197 mL — AB (ref 62–150)
LVEEMED: 12.82
LVELAT: 5.22 cm/s
LVSYSVOL: 142 mL — AB (ref 21–61)
LVSYSVOLIN: 67 mL/m2
MV pk E vel: 66.9 m/s
MVPKAVEL: 96.5 m/s
PW: 10.7 mm — AB (ref 0.6–1.1)
RV LATERAL S' VELOCITY: 8.38 cm/s
RV TAPSE: 21.2 mm
Simpson's disk: 28
Stroke v: 55 ml
TDI e' medial: 3.92
WEIGHTICAEL: 3120 [oz_av]

## 2015-12-30 LAB — GLUCOSE, CAPILLARY
GLUCOSE-CAPILLARY: 280 mg/dL — AB (ref 65–99)
Glucose-Capillary: 359 mg/dL — ABNORMAL HIGH (ref 65–99)
Glucose-Capillary: 379 mg/dL — ABNORMAL HIGH (ref 65–99)

## 2015-12-30 LAB — HEMOGLOBIN A1C
HEMOGLOBIN A1C: 9.5 % — AB (ref 4.8–5.6)
MEAN PLASMA GLUCOSE: 226 mg/dL

## 2015-12-30 LAB — TROPONIN I: Troponin I: 0.06 ng/mL (ref <0.03)

## 2015-12-30 MED ORDER — GLIMEPIRIDE 4 MG PO TABS: 4.0000 mg | ORAL_TABLET | Freq: Every day | ORAL | 0 refills | Status: DC

## 2015-12-30 MED ORDER — LISINOPRIL 20 MG PO TABS: 20.0000 mg | ORAL_TABLET | Freq: Every day | ORAL | 0 refills | Status: DC

## 2015-12-30 MED ORDER — INSULIN GLARGINE 100 UNIT/ML ~~LOC~~ SOLN
18.0000 [IU] | Freq: Every day | SUBCUTANEOUS | Status: DC
  Filled 2015-12-30: qty 0.18

## 2015-12-30 MED ORDER — METOPROLOL TARTRATE 25 MG PO TABS
25.0000 mg | ORAL_TABLET | Freq: Once | ORAL | Status: AC
  Administered 2015-12-30: 25 mg via ORAL
  Filled 2015-12-30: qty 1

## 2015-12-30 MED ORDER — GLIMEPIRIDE 4 MG PO TABS: 4.0000 mg | ORAL_TABLET | Freq: Every day | ORAL | Status: DC

## 2015-12-30 MED ORDER — GEMFIBROZIL 600 MG PO TABS: 600.0000 mg | ORAL_TABLET | Freq: Two times a day (BID) | ORAL | Status: DC

## 2015-12-30 MED ORDER — NICOTINE 21 MG/24HR TD PT24: 21.0000 mg | MEDICATED_PATCH | Freq: Every day | TRANSDERMAL | 0 refills | Status: DC

## 2015-12-30 MED ORDER — ASPIRIN EC 81 MG PO TBEC: 81.0000 mg | DELAYED_RELEASE_TABLET | Freq: Every day | ORAL | Status: DC

## 2015-12-30 MED ORDER — CLOPIDOGREL BISULFATE 75 MG PO TABS: 75.0000 mg | ORAL_TABLET | Freq: Every day | ORAL | 0 refills | Status: DC

## 2015-12-30 MED ORDER — GEMFIBROZIL 600 MG PO TABS: 600.0000 mg | ORAL_TABLET | Freq: Two times a day (BID) | ORAL | 0 refills | Status: DC

## 2015-12-30 NOTE — Telephone Encounter (Signed)
Unable to reach patient at home number voicemail box is full, attempted to contact patient on his cell phone and voicemail box has not been set up yet. KW

## 2015-12-30 NOTE — Telephone Encounter (Signed)
-----   Message from Anola Gurneyobert Chauvin, [place] sent at 12/30/2015  4:44 PM EST ----- Regarding: transition from admission for CVA Schedule an appointment in 1-2 weeks. See if he has any immediate needs prior to the visit.

## 2015-12-30 NOTE — Progress Notes (Signed)
Inpatient Diabetes Program Recommendations  AACE/ADA: New Consensus Statement on Inpatient Glycemic Control (2015)  Target Ranges:  Prepandial:   less than 140 mg/dL      Peak postprandial:   less than 180 mg/dL (1-2 hours)      Critically ill patients:  140 - 180 mg/dL   Lab Results  Component Value Date   GLUCAP 280 (H) 12/30/2015   HGBA1C 9.5 (H) 12/29/2015    Review of Glycemic Control  Results for Kenneth [name], Kenneth [name] (MRN [id number]) as of 12/30/2015 10:35  Ref. Range 12/29/2015 16:35 12/29/2015 20:08 12/30/2015 07:39  Glucose-Capillary Latest Ref Range: 65 - 99 mg/dL 829202 (H) 562242 (H) 130280 (H)    Diabetes history: Type 2, A1C 9.5% Outpatient Diabetes medications: Metformin 1000mg  bid Current orders for Inpatient glycemic control: Novolog 0-9 tid, Novolog 0-5 units qhs  Inpatient Diabetes Program Recommendations: Please change diet to heart healthy/ carb modified diet.  Consider starting low dose basal insulin, Lantus 18 units qday and increase Novolog correction to moderate correction 0-15 units tid.   Susette RacerJulie Kira Hartl, RN, BA, MHA, CDE Diabetes Coordinator Inpatient Diabetes Program  [phone] (Team Pager) [phone] [facility] Office) 12/30/2015 10:51 AM

## 2015-12-30 NOTE — Progress Notes (Signed)
Physical Therapy Treatment Patient Details Name: Kenneth CluckJimmy R Meneely MRN: [id number] DOB: 07/25/1965 Today's Date: 12/30/2015    History of Present Illness Pt is a 50 y.o. male presenting to hospital with L sided numbness and difficulty walking.  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  PMH includes DM type 2, CAD, htn.    PT Comments    Pt's ambulation improved today compared to yesterday but still demonstrates gait and balance impairments.  Gait quality and balance improved with education of and use of SPC (pt declined SPC from hospital and reports he will pick one up).  Pt educated on OP PT recommendation and [facility] (CM notified).  Pt scored 26/28 on Tinetti balance assessment indicating pt is at low risk for falls.  Pt has most difficulty with balance with looking left or up during ambulation and with narrow BOS.  Recommend pt discharge to home with OP PT ([facility]), use of SPC, and initial SBA for functional mobility for safety.   Follow Up Recommendations  Outpatient PT;Supervision for mobility/OOB [facility])     Equipment Recommendations   [facility](SPC (pt reports he will get his own))    Recommendations for Other Services       Precautions / Restrictions Precautions Precautions: Fall Restrictions Weight Bearing Restrictions: No    Mobility  Bed Mobility Overal bed mobility: Independent             General bed mobility comments: Supine to/from sit without any difficulty.  Transfers Overall transfer level: Independent Equipment used: None Transfers: Sit to/from Stand Sit to Stand: Independent         General transfer comment: no UE required; steady without loss of balance  Ambulation/Gait Ambulation/Gait assistance: Min guard;Supervision Ambulation Distance (Feet):  (200 feet no AD x2 CGA; 200 feet with SPC SBA) Assistive device: None;Straight cane     Gait velocity interpretation: at or  above normal speed for age/gender General Gait Details: mildly more narrow BOS; occasional altered stepping pattern with L LE but pt able to regain balance without assist; mild decreased L heelstrike and foot clearance; improved balance and gait quality noted with use of SPC  Pt required initial vc's for gait technique/use of SPC (technique improved with practice).   Stairs Stairs: Yes   Stair Management: Alternating pattern;Forwards (no rail x4 steps; R rail x 8 steps) Number of Stairs: 12 General stair comments: steady without loss of balance  Wheelchair Mobility    Modified Rankin (Stroke Patients Only)       Balance Overall balance assessment: Needs assistance Sitting-balance support: No upper extremity supported;Feet supported Sitting balance-Leahy Scale: Normal     Standing balance support: No upper extremity supported Standing balance-Leahy Scale: Good Standing balance comment: static standing               High Level Balance Comments: Loss of balance to L with head turns to L but able to self correct; very mild unsteadiness with head turns R and looking down; decreased gait quality and steadiness with looking up; (all head turns performed with ambulation no AD); pt requiring UE support to steady self (to get into position) to perform static standing NBOS eyes open and eyes closed SBA  TINETTI BALANCE ASSESSMENT TOOL BALANCE SECTION Sitting Balance:     1/1  Rises from Chair:   2/2   Attempts to Rise:   2/2  Standing Balance (1st 5 seconds) 2/2  Standing balance:   2/2  Nudged:    2/2 Eyes Closed:    1/1 Turning 360 degrees   1/1      1/1 Sitting down (getting seated):    2/2  Balance Score:    16/16  GAIT SECTION Indication of Gait:   1/1 Step Length and Height  2/2 Foot Clearance:   2/2 Step Symmetry:   1/1 Step Continuity:   1/1 Path:     1/2 Trunk:     1/2 Walking Time:    1/1 Gait Score:    10/12  Combined Score:   26/28    Cognition  Arousal/Alertness: Awake/alert Behavior During Therapy: WFL for tasks assessed/performed Overall Cognitive Status: Within Functional Limits for tasks assessed                      Exercises  Gait training; use of SPC    General Comments General comments (skin integrity, edema, etc.): Pt's wife present during session.  Pt agreeable to PT session.      Pertinent Vitals/Pain Pain Assessment: No/denies pain  See flow sheet for BP, HR, and O2 vitals.    Home Living                      Prior Function            PT Goals (current goals can now be found in the care plan section) Acute Rehab PT Goals Patient Stated Goal: to go home PT Goal Formulation: With patient Time For Goal Achievement: 01/12/16 Potential to Achieve Goals: Good Additional Goals Additional Goal #1: Perform objective balance assessment. Progress towards PT goals: Progressing toward goals    Frequency    7X/week      PT Plan Current plan remains appropriate    Co-evaluation             End of Session Equipment Utilized During Treatment: Gait belt   Patient left: in bed;with call bell/phone within reach;with family/visitor present (Pt refusing bed alarm (nursing notified and aware))     Time: [id number] PT Time Calculation (min) (ACUTE ONLY): 25 min  Charges:  $Gait Training: 23-37 mins                    G CodesHendricks Limes:      Micayla Brathwaite 12/30/2015, 9:49 AM Hendricks LimesEmily Hiawatha Dressel, PT [phone]

## 2015-12-30 NOTE — Discharge Instructions (Signed)
Follow-up with primary care physician in a month Follow-up with endocrinology, cardiology, neurology is recommended

## 2015-12-30 NOTE — Progress Notes (Signed)
Pt for discharge home. Alert. No resp distress.  nih = 0. Denies c/o pain.   Sl d/cd.  moniter d/cd. Discharge instructions discussed  With pt.  Diet activity and f/u discussed.  presc given and  meds discussed.    Verbalize understanding. Pt  Wheeled out in w/c w/o c/o.

## 2015-12-30 NOTE — Plan of Care (Signed)
Problem: Tissue Perfusion: Goal: Cerebral tissue perfusion will improve (applicable to all stroke diagnoses) Outcome: Progressing NIH 0

## 2015-12-30 NOTE — Evaluation (Signed)
Occupational Therapy Evaluation Patient Details Name: Kenneth CluckJimmy R Abdalla MRN: [id number] DOB: 02/28/1965 Today's Date: 12/30/2015    History of Present Illness Pt. is a 50 y.o. male who was [facility] with left sided numbness following a nonhemorrhagic right sided deep white matter infarct, an s a posterior lentiform nucleus infarct. Pt. PMHx include: DM Type II.   Clinical Impression   Pt. Is a 50 y.o. Male who was [facility] with left sided numbness following a CVA. Pt. is back to baseline with UE strength, ROM, coordination skills, and ADL functioning. No further OT services or warranted at this time.     Follow Up Recommendations  No OT follow up    Equipment Recommendations       Recommendations for Other Services       Precautions / Restrictions Precautions Precautions: Fall Restrictions Weight Bearing Restrictions: No      Mobility Bed Mobility    Independent              Transfers        Independent              Balance                                            ADL Overall ADL's : Independent                                             Vision     Perception     Praxis      Pertinent Vitals/Pain Pain Assessment: 0-10 Pain Score: 0-No pain     Hand Dominance Right   Extremity/Trunk Assessment Upper Extremity Assessment Upper Extremity Assessment: Overall WFL for tasks assessed (Bilateral grip strength: Right: 125#, Left 125#. Intact sensory, and proprioceptive awareness.)           Communication Communication Communication: No difficulties   Cognition Arousal/Alertness: Awake/alert Behavior During Therapy: WFL for tasks assessed/performed Overall Cognitive Status: Within Functional Limits for tasks assessed                     General Comments       Exercises       Shoulder Instructions      Home Living Family/patient expects to be discharged to:: Private  residence Living Arrangements: Spouse/significant other Available Help at Discharge: Family Type of Home: House Home Access: Stairs to enter Secretary/administratorntrance Stairs-Number of Steps: 3 Entrance Stairs-Rails: None Home Layout: Two level;Bed/bath upstairs Alternate Level Stairs-Number of Steps: 12-15 Alternate Level Stairs-Rails: Right Bathroom Shower/Tub: Tub/shower unit;Curtain Shower/tub characteristics: Curtain       Home Equipment: None          Prior Functioning/Environment Level of Independence: Independent        Comments: Pt. 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        OT Problem List:     OT Treatment/Interventions:      OT Goals(Current goals can be found in the care plan section) Acute Rehab OT Goals Patient Stated Goal: To return home OT Goal Formulation: With patient Potential to Achieve Goals: Good  OT Frequency:     Barriers to D/C:  Co-evaluation              End of Session    Activity Tolerance: Patient tolerated treatment well Patient left: in bed;with call bell/phone within reach;with family/visitor present   Time: 1405-1430 OT Time Calculation (min): 25 min Charges:    G-Codes:    Olegario MessierElaine Audie Stayer, MS, [name] 12/30/2015, 3:36 PM

## 2015-12-30 NOTE — Care Management (Signed)
Physical therapy evaluation completed. Recommending outpatient therapy. Information about [facility] given to Mr. Nydam and wife. Also, information about physicians accepting new [place] [facility] given to Mr & Ms. Allison QuarryCobb. 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  Possible discharge to home today per Dr. Amado CoeGouru. Family will transport. Gwenette GreetBrenda S Scheryl Sanborn RN MSN CCM Care Management

## 2015-12-30 NOTE — Discharge Summary (Signed)
[facility] [place] [name]   PATIENT NAME: Kenneth GranaJimmy [name]    MR#:  [id number]  DATE OF BIRTH:  04/12/1965  DATE OF ADMISSION:  12/29/2015 ADMITTING PHYSICIAN: Kenneth Hasteavid K Hower, [name]  DATE OF DISCHARGE: 12/30/15  PRIMARY CARE PHYSICIAN: Kenneth Gurneyobert Chauvin, PA    ADMISSION DIAGNOSIS:  TIA (transient ischemic attack) [G45.9] Unsteady gait [R26.81] Left sided numbness [R20.0] Cerebrovascular accident (CVA), unspecified mechanism (HCC) [I63.9]  DISCHARGE DIAGNOSIS:  Acute CVA Uncontrolled diabetes mellitus Hypertriglyceridemia  SECONDARY DIAGNOSIS:   Past Medical History:  Diagnosis Date  . Coronary artery disease   . Diabetes mellitus without complication (HCC)   . Hypertension     HOSPITAL COURSE:   Kenneth [name]  is a 50 y.o. male with a known history of type 2 diabetes without insulin presenting with left sided numbness. Acute onset 9pm day before presentation. "like its asleep" no weakness but difficulty walking. Please review history and physical for details   1. Acute CVA :  MRI brain with right-sided stroke acute Triglycerides 2631 LDL cannot be calculated Echocardiogram with 30-35% ejection fraction with no cardiac embolus source. [facility] as recommended Carotid Dopplers with less than 50% stenosis Patient has passed bedside swallow evaluation PT has recommended outpatient PT Neurology has seen and recommended aspirin 81 mg and Plavix 75 mg once daily along with Lipitor 80 mg Patient is started on Lopid. As he is on combination of Lipitor and Lopid primary care physician has to closely monitor his LFTs and patient was warned about the side effects of muscle cramps and other adverse effects he is aware. He needs lifestyle modifications  tight control of his risk factor PCP can refer the patient to endocrinology for better diabetes control   2. Type 2 diabetes: Continue metformin and started patient on Amaryl  .outpatient endocrinology follow-up is recommended   3. Hyperlipidemia and hypertriglyceridemia  unspecified: statin Lipitor 80 mg will be continued and Lopid as added to the regimen for hypertriglyceridemia. Closely monitor LFTs   4. htn essential-allowed permissive hypertension Continue lisinopril dose adjusted for better control  5. eelvated troponin: expected in setting of stroke,  no  Trending Outpatient follow-up with cardiology as recommended       DISCHARGE CONDITIONS:   fair  CONSULTS OBTAINED:  Treatment Team:  Kenneth Hasteavid K Hower, [name] Kenneth [name] Kenneth [name]   PROCEDURES none  DRUG ALLERGIES:  No Known Allergies  DISCHARGE MEDICATIONS:   Current Discharge Medication List    START taking these medications   Details  aspirin EC 81 MG tablet Take 1 tablet (81 mg total) by mouth daily.    clopidogrel (PLAVIX) 75 MG tablet Take 1 tablet (75 mg total) by mouth daily. Qty: 30 tablet, Refills: 0    gemfibrozil (LOPID) 600 MG tablet Take 1 tablet (600 mg total) by mouth 2 (two) times daily before a meal. Qty: 60 tablet, Refills: 0    glimepiride (AMARYL) 4 MG tablet Take 1 tablet (4 mg total) by mouth daily with breakfast. Qty: 30 tablet, Refills: 0    nicotine (NICODERM CQ - DOSED IN MG/24 HOURS) 21 mg/24hr patch Place 1 patch (21 mg total) onto the skin daily. Qty: 28 patch, Refills: 0      CONTINUE these medications which have CHANGED   Details  lisinopril (PRINIVIL,ZESTRIL) 20 MG tablet Take 1 tablet (20 mg total) by mouth daily. Two tablets in the morning and 1 tablet in the evening Qty: 60 tablet, Refills: 0  CONTINUE these medications which have NOT CHANGED   Details  atorvastatin (LIPITOR) 80 MG tablet Take 1 tablet (80 mg total) by mouth daily. Qty: 90 tablet, Refills: 3   Associated Diagnoses: HLD (hyperlipidemia)    buPROPion (WELLBUTRIN SR) 100 MG 12 hr tablet Take 100 mg by mouth 2 (two) times daily.     metFORMIN (GLUCOPHAGE) 1000 MG tablet  Take 1,000 mg by mouth 2 (two) times daily with a meal.         DISCHARGE INSTRUCTIONS:   Follow-up with primary care physician in a month Follow-up with endocrinology, cardiology, neurology is recommended   DIET:  Diabetic diet,Cardiac  DISCHARGE CONDITION:  Fair  ACTIVITY:  Activity as tolerated, op pt   OXYGEN:  Home Oxygen: No.   Oxygen Delivery: room air  DISCHARGE LOCATION:  home   If you experience worsening of your admission symptoms, develop shortness of breath, life threatening emergency, suicidal or homicidal thoughts you must seek medical attention immediately by calling 911 or calling your [name] immediately  if symptoms less severe.  You Must read complete instructions/literature along with all the possible adverse reactions/side effects for all the Medicines you take and that have been prescribed to you. Take any new Medicines after you have completely understood and accpet all the possible adverse reactions/side effects.   Please note  You were cared for by a hospitalist during your hospital stay. If you have any questions about your discharge medications or the care you received while you were in the hospital after you are discharged, you can call the unit and asked to speak with the hospitalist on call if the hospitalist that took care of you is not available. Once you are discharged, your primary care physician will handle any further medical issues. Please note that NO REFILLS for any discharge medications will be authorized once you are discharged, as it is imperative that you return to your primary care physician (or establish a relationship with a primary care physician if you do not have one) for your aftercare needs so that they can reassess your need for medications and monitor your lab values.     Today  Chief Complaint  Patient presents with  . Extremity Weakness   Patient is feeling much better. Left-sided weakness improved tolerating diet wants  to go home  ROS:  CONSTITUTIONAL: Denies fevers, chills. Denies any fatigue, weakness.  EYES: Denies blurry vision, double vision, eye pain. EARS, NOSE, THROAT: Denies tinnitus, ear pain, hearing loss. RESPIRATORY: Denies cough, wheeze, shortness of breath.  CARDIOVASCULAR: Denies chest pain, palpitations, edema.  GASTROINTESTINAL: Denies nausea, vomiting, diarrhea, abdominal pain. Denies bright red blood per rectum. GENITOURINARY: Denies dysuria, hematuria. ENDOCRINE: Denies nocturia or thyroid problems. HEMATOLOGIC AND LYMPHATIC: Denies easy bruising or bleeding. SKIN: Denies rash or lesion. MUSCULOSKELETAL: Denies pain in neck, back, shoulder, knees, hips or arthritic symptoms.  NEUROLOGIC: Denies paralysis, paresthesias.  PSYCHIATRIC: Denies anxiety or depressive symptoms.   VITAL SIGNS:  Blood pressure (!) 142/97, pulse 80, temperature 97.8 F (36.6 C), temperature source Oral, resp. rate 18, height 5\' 11"  (1.803 m), weight 88.5 kg (195 lb), SpO2 97 %.  I/O:    Intake/Output Summary (Last 24 hours) at 12/30/15 1636 Last data filed at 12/30/15 0900  Gross per 24 hour  Intake              240 ml  Output                0 ml  Net  240 ml    PHYSICAL EXAMINATION:  GENERAL:  50 y.o.-year-old patient lying in the bed with no acute distress.  EYES: Pupils equal, round, reactive to light and accommodation. No scleral icterus. Extraocular muscles intact.  HEENT: Head atraumatic, normocephalic. Oropharynx and nasopharynx clear.  NECK:  Supple, no jugular venous distention. No thyroid enlargement, no tenderness.  LUNGS: Normal breath sounds bilaterally, no wheezing, rales,rhonchi or crepitation. No use of accessory muscles of respiration.  CARDIOVASCULAR: S1, S2 normal. No murmurs, rubs, or gallops.  ABDOMEN: Soft, non-tender, non-distended. Bowel sounds present. No organomegaly or mass.  EXTREMITIES: No pedal edema, cyanosis, or clubbing.  NEUROLOGIC: Cranial nerves II  through XII are intact. Muscle strength 5/5 in all extremities. Sensation intact. Gait not checked.  PSYCHIATRIC: The patient is alert and oriented x 3.  SKIN: No obvious rash, lesion, or ulcer.   DATA REVIEW:   CBC  Recent Labs Lab 12/29/15 0823  WBC 7.9  HGB 17.9  HCT 51.3  PLT 273    Chemistries   Recent Labs Lab 12/29/15 0823  NA 135  K 4.0  CL 102  CO2 24  GLUCOSE 231*  BUN 12  CREATININE 0.90  CALCIUM 9.4  AST 18  ALT 13*  ALKPHOS 91  BILITOT 0.9    Cardiac Enzymes  Recent Labs Lab 12/29/15 2355  TROPONINI 0.06*    Microbiology Results  No results found for this or any previous visit.  RADIOLOGY:  Dg Chest 1 View  Result Date: 12/29/2015 CLINICAL DATA:  Left arm and leg numbness beginning last night at 9 p.m. Cough and congestion for 1 week. EXAM: CHEST 1 VIEW COMPARISON:  PA and lateral chest 06/19/2015. FINDINGS: Lungs clear. Heart size normal. No pneumothorax or pleural fluid. No bony abnormality. IMPRESSION: Normal chest. Electronically Signed   By: Drusilla Kanner M.D.   On: 12/29/2015 09:19   Ct Head Wo Contrast  Result Date: 12/29/2015 CLINICAL DATA:  Left-sided numbness 12 hours duration. Unsteady gait. Code stroke. EXAM: CT HEAD WITHOUT CONTRAST TECHNIQUE: Contiguous axial images were obtained from the base of the skull through the vertex without intravenous contrast. COMPARISON:  None. FINDINGS: Brain: No evidence of malformation, atrophy, old or acute small or large vessel infarction, mass lesion, hemorrhage, hydrocephalus or extra-axial collection. No evidence of pituitary lesion. Vascular: No vascular calcification.  No hyperdense vessels. Skull: Normal.  No fracture or focal bone lesion. Sinuses/Orbits: Visualized sinuses are clear. No fluid in the middle ears or mastoids. Visualized orbits are normal. Other: None significant IMPRESSION: Normal head CT These results were called by telephone at the time of interpretation on 12/29/2015 at 9:17  am to Dr. Willy Eddy , who verbally acknowledged these results. Electronically Signed   By: Paulina Fusi M.D.   On: 12/29/2015 09:19   Mr Brain Wo Contrast  Result Date: 12/29/2015 CLINICAL DATA:  LEFT-sided numbness. Difficulty walking. Symptom onset approximately 9 p.m. 12/28/2015. Stroke risk factors include type 2 diabetes, and hypertension EXAM: MRI HEAD WITHOUT CONTRAST TECHNIQUE: Multiplanar, multiecho pulse sequences of the brain and surrounding structures were obtained without intravenous contrast. COMPARISON:  CT head earlier today. FINDINGS: Brain: Tubular area of restricted diffusion affects the posterior limb internal capsule, centrum semiovale, and posterior most aspect of the lentiform nucleus consistent with acute infarction. This is not readily visible on earlier CT. No hemorrhage, mass lesion, hydrocephalus, or extra-axial fluid. No other similar abnormalities. Normal for age cerebral volume. No significant white matter disease. Vascular: Flow voids are maintained throughout the  carotid, basilar, and vertebral arteries. There are no areas of chronic hemorrhage. Skull and upper cervical spine: Unremarkable visualized calvarium, skullbase, and cervical vertebrae. Empty sella with expansion, uncertain significance. Pineal, and cerebellar tonsils unremarkable. No upper cervical cord lesions. Sinuses/Orbits: No orbital masses or proptosis. Globes appear symmetric. Sinuses appear well aerated, without evidence for air-fluid level. Other: None. IMPRESSION: Nonhemorrhagic RIGHT-sided deep white matter infarct also affecting posterior lentiform nucleus would correlate with LEFT-sided symptoms. This is nonhemorrhagic. No other acute or focal intracranial abnormalities. No proximal large vessel occlusion is established. Electronically Signed   By: Elsie StainJohn T Curnes M.D.   On: 12/29/2015 11:56   [place] Carotid Bilateral  Result Date: 12/29/2015 CLINICAL DATA:  TIA.  Left arm and leg weakness. EXAM:  BILATERAL CAROTID DUPLEX ULTRASOUND TECHNIQUE: Wallace CullensGray scale imaging, color Doppler and duplex ultrasound were performed of bilateral carotid and vertebral arteries in the neck. COMPARISON:  None. FINDINGS: Criteria: Quantification of carotid stenosis is based on velocity parameters that correlate the residual internal carotid diameter with NASCET-based stenosis levels, using the diameter of the distal internal carotid lumen as the denominator for stenosis measurement. The following velocity measurements were obtained: RIGHT ICA:  76 cm/sec CCA:  84 cm/sec SYSTOLIC ICA/CCA RATIO:  0.9 DIASTOLIC ICA/CCA RATIO:  1.6 ECA:  69 cm/sec LEFT ICA:  62 cm/sec CCA:  98 cm/sec SYSTOLIC ICA/CCA RATIO:  0.8 DIASTOLIC ICA/CCA RATIO:  1.3 ECA:  78 cm/sec RIGHT CAROTID ARTERY: Mild plaque in the internal carotid artery. Normal waveforms and velocities in the internal carotid artery. External carotid artery is patent with normal waveform. RIGHT VERTEBRAL ARTERY: Antegrade flow and normal waveform in the right vertebral artery. LEFT CAROTID ARTERY: External carotid artery is patent with normal waveform. Small amount of heterogeneous plaque in the left internal carotid artery. Normal waveforms and velocities in the internal carotid artery. LEFT VERTEBRAL ARTERY: Antegrade flow and normal waveform in the left vertebral artery. IMPRESSION: Mild atherosclerotic disease in the internal carotid arteries bilaterally. Estimated degree of stenosis in the internal carotid arteries is less than 50% bilaterally. Patent vertebral arteries with antegrade flow. Electronically Signed   By: Richarda OverlieAdam  Henn M.D.   On: 12/29/2015 12:02    EKG:   Orders placed or performed during the hospital encounter of 12/29/15  . ED EKG  . ED EKG  . EKG 12-Lead  . EKG 12-Lead      Management plans discussed with the patient, family and they are in agreement.  CODE STATUS:     Code Status Orders        Start     Ordered   12/29/15 0959  Full code   Continuous     12/29/15 0958    Code Status History    Date Active Date Inactive Code Status Order ID Comments User Context   This patient has a current code status but no historical code status.      TOTAL TIME TAKING CARE OF THIS PATIENT: 45 minutes.   Note: This dictation was prepared with Dragon dictation along with smaller phrase technology. Any transcriptional errors that result from this process are unintentional.   @MEC @  on 12/30/2015 at 4:36 PM  Between 7am to 6pm - Pager - [phone]  After 6pm go to [email or web address] - password [email or web address] [facility]  [facility]  Office  [phone]  CC: Primary care physician; Kenneth Gurneyobert Chauvin, PA

## 2015-12-30 NOTE — Progress Notes (Signed)
Inpatient Diabetes Program Recommendations  AACE/ADA: New Consensus Statement on Inpatient Glycemic Control (2015)  Target Ranges:  Prepandial:   less than 140 mg/dL      Peak postprandial:   less than 180 mg/dL (1-2 hours)      Critically ill patients:  140 - 180 mg/dL   Lab Results  Component Value Date   GLUCAP 359 (H) 12/30/2015   HGBA1C 9.5 (H) 12/29/2015    Call received from RN.  Note that patient is being d/c'd today.  A1C elevated.  According to case management note, patient does not have insurance. Therefore Lantus may not be affordable?  Called and discussed with MD. She states she will d/c patient on Amaryl and Metformin.    Made phone call to patient's room regarding elevated A1C.  Patient states that his last A1C was approximately 8%.  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  Discussed normal blood sugar levels and monitoring.  He does have meter at home.  Discussed Amaryl that will be added to his medication regimen and the importance of follow-up with PCP. He states that triglycerides have been high for "years".  He has eliminated ground beef from his diet and does ground Malaw[place]urkey or chicken instead.  He also does not drink sugar in his beverages.  Discussed the role of CHO on blood sugars and triglycerides.  He verbalized understanding.  Need follow up with PCP after d/c from hospital.   Thanks, Beryl MeagerJenny Mariusz Jubb, RN, BC-ADM Inpatient Diabetes Coordinator Pager [phone] (8a-5p)

## 2016-01-02 NOTE — Telephone Encounter (Signed)
appt set up for 12/27.KW

## 2016-01-02 NOTE — Telephone Encounter (Signed)
Push appointment to two weeks as they wanted me to recheck his hepatic  Profile.

## 2016-01-02 NOTE — Telephone Encounter (Signed)
Spoke with patient on the phone he states that he was discharged from hospital with medication that his wife will be picking up today. Patient states that he has numbness on his entire left side of his body. Patient states that his schedules is tight and can only come in afternoon. Given patients current symptoms I arranged appt for tomorrow afternoon, if you want me to push this out further please let me know. Thanks [employer]

## 2016-01-03 ENCOUNTER — Inpatient Hospital Stay: Payer: Self-pay | Admitting: Family Medicine

## 2016-01-05 NOTE — ED Notes (Signed)
Corrected lung sounds issue on stroke swallow screen; this was an error in charting; pt had no change in lung sounds after swallowing.

## 2016-01-18 ENCOUNTER — Inpatient Hospital Stay: Payer: Self-pay | Admitting: Family Medicine

## 2016-08-10 ENCOUNTER — Ambulatory Visit: Payer: Self-pay | Admitting: Family Medicine

## 2017-02-19 ENCOUNTER — Emergency Department
Admission: EM | Admit: 2017-02-19 | Discharge: 2017-02-20 | Disposition: A | Discharge status: Other Acute Inpatient Hospital | Payer: Self-pay | Attending: Emergency Medicine | Admitting: Emergency Medicine

## 2017-02-19 ENCOUNTER — Encounter: Payer: Self-pay | Admitting: Emergency Medicine

## 2017-02-19 ENCOUNTER — Emergency Department: Payer: Self-pay

## 2017-02-19 ENCOUNTER — Other Ambulatory Visit: Payer: Self-pay

## 2017-02-19 DIAGNOSIS — R112 Nausea with vomiting, unspecified: Secondary | ICD-10-CM

## 2017-02-19 DIAGNOSIS — Z8673 Personal history of transient ischemic attack (TIA), and cerebral infarction without residual deficits: Secondary | ICD-10-CM | POA: Insufficient documentation

## 2017-02-19 DIAGNOSIS — R778 Other specified abnormalities of plasma proteins: Secondary | ICD-10-CM

## 2017-02-19 DIAGNOSIS — Z79899 Other long term (current) drug therapy: Secondary | ICD-10-CM | POA: Insufficient documentation

## 2017-02-19 DIAGNOSIS — I2510 Atherosclerotic heart disease of native coronary artery without angina pectoris: Secondary | ICD-10-CM | POA: Insufficient documentation

## 2017-02-19 DIAGNOSIS — I10 Essential (primary) hypertension: Secondary | ICD-10-CM

## 2017-02-19 DIAGNOSIS — Z7902 Long term (current) use of antithrombotics/antiplatelets: Secondary | ICD-10-CM | POA: Insufficient documentation

## 2017-02-19 DIAGNOSIS — F17210 Nicotine dependence, cigarettes, uncomplicated: Secondary | ICD-10-CM | POA: Insufficient documentation

## 2017-02-19 DIAGNOSIS — Z7982 Long term (current) use of aspirin: Secondary | ICD-10-CM | POA: Insufficient documentation

## 2017-02-19 DIAGNOSIS — I639 Cerebral infarction, unspecified: Secondary | ICD-10-CM

## 2017-02-19 DIAGNOSIS — R748 Abnormal levels of other serum enzymes: Secondary | ICD-10-CM | POA: Insufficient documentation

## 2017-02-19 DIAGNOSIS — E119 Type 2 diabetes mellitus without complications: Secondary | ICD-10-CM | POA: Insufficient documentation

## 2017-02-19 DIAGNOSIS — R7989 Other specified abnormal findings of blood chemistry: Secondary | ICD-10-CM

## 2017-02-19 DIAGNOSIS — Z7984 Long term (current) use of oral hypoglycemic drugs: Secondary | ICD-10-CM | POA: Insufficient documentation

## 2017-02-19 DIAGNOSIS — I7774 Dissection of vertebral artery: Secondary | ICD-10-CM

## 2017-02-19 LAB — COMPREHENSIVE METABOLIC PANEL WITH GFR
ALT: 12 U/L — ABNORMAL LOW (ref 17–63)
AST: 21 U/L (ref 15–41)
Albumin: 4.1 g/dL (ref 3.5–5.0)
Alkaline Phosphatase: 80 U/L (ref 38–126)
Anion gap: 10 (ref 5–15)
BUN: 17 mg/dL (ref 6–20)
CO2: 22 mmol/L (ref 22–32)
Calcium: 9.1 mg/dL (ref 8.9–10.3)
Chloride: 102 mmol/L (ref 101–111)
Creatinine, Ser: 1.06 mg/dL (ref 0.61–1.24)
GFR calc Af Amer: >60 mL/min
GFR calc non Af Amer: >60 mL/min
Glucose, Bld: 199 mg/dL — ABNORMAL HIGH (ref 65–99)
Potassium: 3.7 mmol/L (ref 3.5–5.1)
Sodium: 134 mmol/L — ABNORMAL LOW (ref 135–145)
Total Bilirubin: 0.7 mg/dL (ref 0.3–1.2)
Total Protein: 7.5 g/dL (ref 6.5–8.1)

## 2017-02-19 LAB — CBC WITH DIFFERENTIAL/PLATELET
Basophils Absolute: 0.1 K/uL (ref 0–0.1)
Basophils Relative: 1 %
Eosinophils Absolute: 0.1 K/uL (ref 0–0.7)
Eosinophils Relative: 1 %
HCT: 52.6 % — ABNORMAL HIGH (ref 40.0–52.0)
Hemoglobin: 17.8 g/dL (ref 13.0–18.0)
Lymphocytes Relative: 32 %
Lymphs Abs: 3.3 K/uL (ref 1.0–3.6)
MCH: 29.1 pg (ref 26.0–34.0)
MCHC: 33.8 g/dL (ref 32.0–36.0)
MCV: 85.9 fL (ref 80.0–100.0)
Monocytes Absolute: 0.6 K/uL (ref 0.2–1.0)
Monocytes Relative: 6 %
Neutro Abs: 6.1 K/uL (ref 1.4–6.5)
Neutrophils Relative %: 60 %
Platelets: 310 K/uL (ref 150–440)
RBC: 6.12 MIL/uL — ABNORMAL HIGH (ref 4.40–5.90)
RDW: 13.5 % (ref 11.5–14.5)
WBC: 10.1 K/uL (ref 3.8–10.6)

## 2017-02-19 LAB — TROPONIN I
Troponin I: 0.04 ng/mL
Troponin I: 0.05 ng/mL (ref <0.03)

## 2017-02-19 LAB — GLUCOSE, CAPILLARY: Glucose-Capillary: 197 mg/dL — ABNORMAL HIGH (ref 65–99)

## 2017-02-19 LAB — HEMOGLOBIN A1C
Hgb A1c MFr Bld: 7.7 % — ABNORMAL HIGH (ref 4.8–5.6)
Mean Plasma Glucose: 174.29 mg/dL

## 2017-02-19 LAB — MAGNESIUM: Magnesium: 1.9 mg/dL (ref 1.7–2.4)

## 2017-02-19 LAB — LIPASE, BLOOD: Lipase: 50 U/L (ref 11–51)

## 2017-02-19 LAB — LACTIC ACID, PLASMA
LACTIC ACID, VENOUS: 0.9 mmol/L (ref 0.5–1.9)
Lactic Acid, Venous: 2.7 mmol/L (ref 0.5–1.9)

## 2017-02-19 MED ORDER — SODIUM CHLORIDE 0.9 % IV SOLN
Freq: Once | INTRAVENOUS | Status: AC
  Administered 2017-02-19: 19:00:00 via INTRAVENOUS

## 2017-02-19 MED ORDER — DEXAMETHASONE SODIUM PHOSPHATE 10 MG/ML IJ SOLN
10.0000 mg | Freq: Once | INTRAMUSCULAR | Status: AC
  Administered 2017-02-19: 10 mg via INTRAVENOUS

## 2017-02-19 MED ORDER — SODIUM CHLORIDE 0.9 % IV BOLUS (SEPSIS)
1000.0000 mL | INTRAVENOUS | Status: AC
  Administered 2017-02-19: 1000 mL via INTRAVENOUS

## 2017-02-19 MED ORDER — MORPHINE SULFATE (PF) 4 MG/ML IV SOLN
INTRAVENOUS | Status: AC
  Filled 2017-02-19: qty 1

## 2017-02-19 MED ORDER — DIPHENHYDRAMINE HCL 50 MG/ML IJ SOLN
25.0000 mg | INTRAMUSCULAR | Status: AC
  Administered 2017-02-19: 25 mg via INTRAVENOUS

## 2017-02-19 MED ORDER — MIDAZOLAM HCL 5 MG/5ML IJ SOLN
INTRAMUSCULAR | Status: AC
Start: 2017-02-19 — End: 2017-02-19
  Administered 2017-02-19: 1 mg via INTRAVENOUS
  Filled 2017-02-19: qty 5

## 2017-02-19 MED ORDER — METOCLOPRAMIDE HCL 5 MG/ML IJ SOLN
10.0000 mg | INTRAMUSCULAR | Status: AC
  Administered 2017-02-19: 10 mg via INTRAVENOUS

## 2017-02-19 MED ORDER — IOPAMIDOL (ISOVUE-370) INJECTION 76%
75.0000 mL | Freq: Once | INTRAVENOUS | Status: AC | PRN
  Administered 2017-02-19: 75 mL via INTRAVENOUS

## 2017-02-19 MED ORDER — DEXAMETHASONE SODIUM PHOSPHATE 10 MG/ML IJ SOLN
INTRAMUSCULAR | Status: AC
  Administered 2017-02-19: 10 mg via INTRAVENOUS
  Filled 2017-02-19: qty 1

## 2017-02-19 MED ORDER — MORPHINE SULFATE (PF) 4 MG/ML IV SOLN
4.0000 mg | Freq: Once | INTRAVENOUS | Status: AC
  Administered 2017-02-19: 4 mg via INTRAVENOUS

## 2017-02-19 MED ORDER — HALOPERIDOL LACTATE 5 MG/ML IJ SOLN
1.0000 mg | Freq: Once | INTRAMUSCULAR | Status: AC
  Administered 2017-02-19: 1 mg via INTRAVENOUS

## 2017-02-19 MED ORDER — MIDAZOLAM HCL 5 MG/5ML IJ SOLN
1.0000 mg | Freq: Once | INTRAMUSCULAR | Status: AC
  Administered 2017-02-19: 1 mg via INTRAVENOUS

## 2017-02-19 MED ORDER — HALOPERIDOL LACTATE 5 MG/ML IJ SOLN
INTRAMUSCULAR | Status: AC
  Administered 2017-02-19: 1 mg via INTRAVENOUS
  Filled 2017-02-19: qty 1

## 2017-02-19 MED ORDER — ASPIRIN 81 MG PO CHEW
324.0000 mg | CHEWABLE_TABLET | Freq: Once | ORAL | Status: AC
  Administered 2017-02-19: 324 mg via ORAL
  Filled 2017-02-19: qty 4

## 2017-02-19 MED ORDER — MORPHINE SULFATE (PF) 4 MG/ML IV SOLN
4.0000 mg | Freq: Once | INTRAVENOUS | Status: AC
  Administered 2017-02-19: 4 mg via INTRAVENOUS
  Filled 2017-02-19: qty 1

## 2017-02-19 MED ORDER — DIPHENHYDRAMINE HCL 50 MG/ML IJ SOLN
INTRAMUSCULAR | Status: AC
Start: 2017-02-19 — End: 2017-02-19
  Filled 2017-02-19: qty 1

## 2017-02-19 MED ORDER — METOCLOPRAMIDE HCL 5 MG/ML IJ SOLN
INTRAMUSCULAR | Status: AC
  Filled 2017-02-19: qty 2

## 2017-02-19 NOTE — ED Provider Notes (Signed)
[facility] Emergency Department Provider Note  ____________________________________________   First MD Initiated Contact with Patient 02/19/17 1109     (approximate)  I have reviewed the triage vital signs and the nursing notes.   HISTORY  Chief Complaint Headache; Emesis; and Nausea  Level 5 caveat:  history/ROS limited by acute/critical illness  HPI Kenneth [name] is a 52 y.o. male whose medical history includes hypertension, coronary artery disease, uncomplicated diabetes, and a CVA a little over a year ago who presents by EMS for evaluation of acute onset headache with severe and intractable vomiting and generalized weakness.  He reports that for the last week his head is been hurting "at the base of my skull" on and off and nothing in particular makes it better or worse.  Starting this morning, however, the headache became acutely worse and was accompanied with severe and persistent vomiting.  He continues to be nauseated even after receiving 2 rounds of Zofran 4 mg IV by EMS and is actively vomiting in the exam room being transferred to the stretcher.  History is somewhat limited due to his acute distress from the nausea/vomiting and headache, but he denies any recent fever/chills, chest pain, shortness of breath, abdominal pain, or dysuria.  He reports that the rest of his symptoms are severe and nothing is making them better.  Moving around seems to make his symptoms worse.  He feels like when he vomits he briefly has some vision loss but the visual symptoms improve when he stops vomiting.  He denies neck pain and stiffness.  It sounds as if he has not been taking any of his medications because he mentioned "starting back on them yesterday" when I asked him about his diabetes medication.  He does not remember if he takes anything for blood pressure and does not remember if he still takes Plavix and baby aspirin as was prescribed after his CVA.    Past Medical  History:  Diagnosis Date  . Coronary artery disease   . Diabetes mellitus without complication (HCC)   . Hypertension     Patient Active Problem List   Diagnosis Date Noted  . CVA (cerebral vascular accident) (HCC) 12/30/2015  . TIA (transient ischemic attack) 12/29/2015  . Hypertension 06/30/2015  . Arteriosclerosis of coronary artery 07/28/2014  . Type 2 diabetes mellitus with hyperglycemia (HCC) 07/28/2014  . Esophageal reflux 07/28/2014  . HLD (hyperlipidemia) 09/10/2006    Past Surgical History:  Procedure Laterality Date  . CORONARY ANGIOPLASTY WITH STENT PLACEMENT  06/18/2008  . CORONARY ANGIOPLASTY WITH STENT PLACEMENT      Prior to Admission medications   Medication Sig Start Date End Date Taking? Authorizing Provider  aspirin EC 81 MG tablet Take 1 tablet (81 mg total) by mouth daily. 12/30/15   Ramonita Lab, MD  atorvastatin (LIPITOR) 80 MG tablet Take 1 tablet (80 mg total) by mouth daily. 06/30/15   Anola Gurney, PA  buPROPion (WELLBUTRIN SR) 100 MG 12 hr tablet Take 100 mg by mouth 2 (two) times daily.  06/29/15 06/28/16  [provider]  clopidogrel (PLAVIX) 75 MG tablet Take 1 tablet (75 mg total) by mouth daily. 12/30/15   Ramonita Lab, MD  gemfibrozil (LOPID) 600 MG tablet Take 1 tablet (600 mg total) by mouth 2 (two) times daily before a meal. 12/30/15   Gouru, Deanna Artis, MD  glimepiride (AMARYL) 4 MG tablet Take 1 tablet (4 mg total) by mouth daily with breakfast. 12/31/15   Ramonita Lab, MD  lisinopril (PRINIVIL,ZESTRIL) 20 MG tablet Take 1 tablet (20 mg total) by mouth daily. Two tablets in the morning and 1 tablet in the evening 12/30/15   Gouru, Deanna Artis, MD  metFORMIN (GLUCOPHAGE) 1000 MG tablet Take 1,000 mg by mouth 2 (two) times daily with a meal.    [provider]  nicotine (NICODERM CQ - DOSED IN MG/24 HOURS) 21 mg/24hr patch Place 1 patch (21 mg total) onto the skin daily. 12/31/15   Ramonita Lab, MD    Allergies Patient has no known  allergies.  Family History  Problem Relation Age of Onset  . Heart attack Mother   . Heart failure Mother   . Coronary artery disease Father   . Diabetes Father     Social History Social History   Tobacco Use  . Smoking status: Heavy Tobacco Smoker    Packs/day: 2.00    Types: Cigarettes  . Smokeless tobacco: Never Used  Substance Use Topics  . Alcohol use: No    Alcohol/week: 0.0 oz    Comment: OCCASIONALLY  . Drug use: No    Review of Systems Level 5 caveat:  history/ROS limited by acute/critical illness, the patient denies any acute symptoms except for severe posterior headache and severe vomiting/nausea ____________________________________________   PHYSICAL EXAM:  ED Triage Vitals  Enc Vitals Group     BP 02/19/17 1130 (!) 162/98     Pulse Rate 02/19/17 1130 72     Resp 02/19/17 1150 12     Temp --      Temp Source 02/19/17 1150 Oral     SpO2 02/19/17 1130 92 %     Weight 02/19/17 1119 83.9 kg (185 lb)     Height 02/19/17 1119 1.727 m (5\' 8" )     Head Circumference --      Peak Flow --      Pain Score 02/19/17 1118 8     Pain Loc --      Pain Edu? --      Excl. in GC? --      Constitutional: Alert and oriented, anxious, appears very uncomfortable and is actively vomiting Eyes: Conjunctivae are normal.  Head: Atraumatic. Nose: No congestion/rhinnorhea. Neck: No stridor.  No meningeal signs.   Cardiovascular: Borderline tachycardia, regular rhythm. Good peripheral circulation. Grossly normal heart sounds. Respiratory: Normal respiratory effort.  No retractions. Lungs CTAB. Gastrointestinal: Soft with diffuse tenderness throughout, no distention, actively vomiting which limits ability to palpate/examine Musculoskeletal: No lower extremity tenderness nor edema. No gross deformities of extremities. Neurologic:  Normal speech and language. No gross focal neurologic deficits are appreciated.  Skin:  Skin is warm, dry and intact. No rash noted. Psychiatric:  Mood and affect are anxious. Speech and behavior are normal under the circumstances  ____________________________________________   LABS (all labs ordered are listed, but only abnormal results are displayed)  Labs Reviewed  COMPREHENSIVE METABOLIC PANEL - Abnormal; Notable for the following components:      Result Value   Sodium 134 (*)    Glucose, Bld 199 (*)    ALT 12 (*)    All other components within normal limits  CBC WITH DIFFERENTIAL/PLATELET - Abnormal; Notable for the following components:   RBC 6.12 (*)    HCT 52.6 (*)    All other components within normal limits  LACTIC ACID, PLASMA - Abnormal; Notable for the following components:   Lactic Acid, Venous 2.7 (*)    All other components within normal limits  TROPONIN I - Abnormal;  Notable for the following components:   Troponin I 0.04 (*)    All other components within normal limits  GLUCOSE, CAPILLARY - Abnormal; Notable for the following components:   Glucose-Capillary 197 (*)    All other components within normal limits  LIPASE, BLOOD  MAGNESIUM  HEMOGLOBIN A1C  LACTIC ACID, PLASMA  LACTIC ACID, PLASMA  TROPONIN I   ____________________________________________  EKG  ED ECG REPORT I, Loleta Roseory Quantez Schnyder, the attending physician, personally viewed and interpreted this ECG.  Date: 02/19/2017 EKG Time: 12:07 PM Rate: 78 Rhythm: normal sinus rhythm QRS Axis: normal Intervals: normal, slightly prolonged QTc at 502 ms ST/T Wave abnormalities: normal Narrative Interpretation: no evidence of acute ischemia  ____________________________________________  RADIOLOGY I, Loleta Roseory Arlethia Basso, personally discussed these images and results by phone with the on-call radiologist and used this discussion as part of my medical decision making.    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 Nausea and vomiting beginning abruptly today. EXAM: CT ANGIOGRAPHY HEAD AND NECK TECHNIQUE: Multidetector CT imaging of the head and neck was performed using the standard protocol during bolus administration of intravenous contrast. Multiplanar CT image reconstructions and MIPs were obtained to evaluate the vascular anatomy. Carotid stenosis measurements (when applicable) are obtained utilizing NASCET criteria, using the distal internal carotid diameter as the denominator. CONTRAST:  75mL ISOVUE-370 IOPAMIDOL (ISOVUE-370) INJECTION 76% COMPARISON:  Head CT without contrast 1156 hours today. Brain MRI 12/29/2015. FINDINGS: CTA NECK Skeleton: Preserved cervical lordosis, perhaps mildly exaggerated. Moderate to severe chronic facet hypertrophy on the right at C2-C3 with trace vacuum facet. Minimal to mild cervical spine degeneration elsewhere. No acute osseous abnormality identified. Upper chest: Negative visible lungs.  Normal superior mediastinum. Other neck: Thyroid is negative for age; there a subcentimeter hypodense nodule in the right lobe which does not meet consensus criteria for further evaluation. Negative neck soft tissues otherwise. No lymphadenopathy. Aortic arch: 3 vessel arch configuration. Minimal arch atherosclerosis. Right carotid system: Negative. Left carotid system: Mild soft and calcified atherosclerosis at the left carotid bifurcation. No associated stenosis. Otherwise negative. Vertebral arteries: Normal proximal right subclavian artery. The very proximal right vertebral artery origin is patent, but enhancement of the vessel abruptly terminates in a triangular shaped area just beyond the origin (series 7, image 121). The remainder of the right V1 segment is not enhancing, but there is intermittent enhancement in the right V 2 segment (such as series 6, image 255 where the lumen appears irregular). The distal V2 segment is  enhancing with an oblique or intraluminal filling defect (series 6, image 209). Approximately half of the vessel lumen appears to be enhancing at the C2 vertebral level. The V3 segment is poorly enhancing. The V4 segment is described below. Normal proximal left subclavian artery and left vertebral artery origin. The left vertebral artery is enhancing and appears normal to the skull base. CTA HEAD Posterior circulation: The distal left vertebral artery appears diminutive beyond the left PICA origin. The more dominant appearing distal right vertebral artery demonstrates poor enhancement in the proximal V4 segment but is moderately enhancing distally, and the vertebrobasilar junction is patent. The basilar artery is patent. SCA and PCA origins are patent. There is a fetal type right PCA origin. The left posterior communicating artery is also present. Bilateral PCA branches are within normal limits.  Anterior circulation: Both ICA siphons are patent. Mild calcified plaque only on the right. No siphon stenosis. Normal ophthalmic and posterior communicating artery origins. Patent carotid termini. Normal MCA and ACA origins. Anterior communicating artery and bilateral ACA branches are within normal limits. Bilateral MCA M1 segments, MCA bifurcations, and bilateral MCA branches are within normal limits. Venous sinuses: Patent. Anatomic variants: Dominant appearance of the distal right vertebral artery, the left is diminutive beyond the left PICA origin. Fetal type right PCA origin. Delayed phase: Posterior fossa gray-white matter differentiation remains normal. Stable gray-white matter differentiation elsewhere. No acute infarct is evident by C No abnormal enhancement identified. Review of the MIP images confirms the above findings IMPRESSION: 1. The right vertebral artery is abnormal throughout its course. 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 2. Unfortunately the distal right vertebral artery is dominant. The left vertebral is normal but diminutive beyond the left PICA. Still, the vertebrobasilar junction, basilar artery, and other posterior circulation remain patent. 3. Stable CT appearance of the brain since 1156 hours today with no acute infarct evident. 4. Minimal carotid atherosclerosis, no carotid stenosis, and otherwise negative anterior circulation. Electronically Signed   By: Odessa Fleming M.D.   On: 02/19/2017 13:51   Ct Head Wo Contrast  Result Date: 02/19/2017 CLINICAL DATA:  Worst headache of life with nausea and vomiting. Prior stroke. EXAM: CT HEAD WITHOUT CONTRAST TECHNIQUE: Contiguous axial images were obtained from the base of the skull through the vertex without intravenous contrast. COMPARISON:  Head CT and MRI 12/29/2015 FINDINGS: Brain: There is a chronic infarct in the posterior limb of the right internal capsule extending to the posterior lentiform nucleus, acute on the prior MRI. There is no evidence of acute infarct, intracranial hemorrhage, mass, midline shift, or extra-axial fluid collection. The ventricles and sulci are normal. A partially empty sella is again noted. Vascular: No hyperdense vessel. Skull: No fracture or focal osseous lesion. Sinuses/Orbits: Visualized paranasal sinuses and mastoid air cells are clear. Orbits are unremarkable. Other: None. IMPRESSION: 1. No evidence of acute intracranial abnormality. 2. 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 Electronically Signed   By: Sebastian Ache M.D.   On: 02/19/2017 12:10   Ct Angio Neck W And/or Wo Contrast  Result Date: 02/19/2017 CLINICAL DATA:  52 year old male with severe posterior neck pain which is non radiating. Nausea and vomiting beginning abruptly today. EXAM: CT ANGIOGRAPHY HEAD AND NECK TECHNIQUE: Multidetector CT imaging of the head and neck was performed using the standard protocol during bolus administration of  intravenous contrast. Multiplanar CT image reconstructions and MIPs were obtained to evaluate the vascular anatomy. Carotid stenosis measurements (when applicable) are obtained utilizing NASCET criteria, using the distal internal carotid diameter as the denominator. CONTRAST:  75mL ISOVUE-370 IOPAMIDOL (ISOVUE-370) INJECTION 76% COMPARISON:  Head CT without contrast 1156 hours today. Brain MRI 12/29/2015. FINDINGS: CTA NECK Skeleton: Preserved cervical lordosis, perhaps mildly exaggerated. Moderate to severe chronic facet hypertrophy on the right at C2-C3 with trace vacuum facet. Minimal to mild cervical spine degeneration elsewhere. No acute osseous abnormality identified. Upper chest: Negative visible lungs.  Normal superior mediastinum. Other neck: Thyroid is negative for age; there a subcentimeter hypodense nodule in the right lobe which does not meet consensus criteria for further evaluation. Negative neck soft tissues otherwise. No lymphadenopathy. Aortic arch: 3 vessel arch configuration. Minimal arch atherosclerosis. Right carotid system: Negative. Left carotid system: Mild soft and calcified atherosclerosis  at the left carotid bifurcation. No associated stenosis. Otherwise negative. Vertebral arteries: Normal proximal right subclavian artery. The very proximal right vertebral artery origin is patent, but enhancement of the vessel abruptly terminates in a triangular shaped area just beyond the origin (series 7, image 121). The remainder of the right V1 segment is not enhancing, but there is intermittent enhancement in the right V 2 segment (such as series 6, image 255 where the lumen appears irregular). The distal V2 segment is enhancing with an oblique or intraluminal filling defect (series 6, image 209). Approximately half of the vessel lumen appears to be enhancing at the C2 vertebral level. The V3 segment is poorly enhancing. The V4 segment is described below. Normal proximal left subclavian artery and  left vertebral artery origin. The left vertebral artery is enhancing and appears normal to the skull base. CTA HEAD Posterior circulation: The distal left vertebral artery appears diminutive beyond the left PICA origin. The more dominant appearing distal right vertebral artery demonstrates poor enhancement in the proximal V4 segment but is moderately enhancing distally, and the vertebrobasilar junction is patent. The basilar artery is patent. SCA and PCA origins are patent. There is a fetal type right PCA origin. The left posterior communicating artery is also present. Bilateral PCA branches are within normal limits. Anterior circulation: Both ICA siphons are patent. Mild calcified plaque only on the right. No siphon stenosis. Normal ophthalmic and posterior communicating artery origins. Patent carotid termini. Normal MCA and ACA origins. Anterior communicating artery and bilateral ACA branches are within normal limits. Bilateral MCA M1 segments, MCA bifurcations, and bilateral MCA branches are within normal limits. Venous sinuses: Patent. Anatomic variants: Dominant appearance of the distal right vertebral artery, the left is diminutive beyond the left PICA origin. Fetal type right PCA origin. Delayed phase: Posterior fossa gray-white matter differentiation remains normal. Stable gray-white matter differentiation elsewhere. No acute infarct is evident by C No abnormal enhancement identified. Review of the MIP images confirms the above findings IMPRESSION: 1. The right vertebral artery is abnormal throughout its course. 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 2. Unfortunately the distal right vertebral artery is dominant. The left vertebral is normal but diminutive beyond the left PICA. Still, the vertebrobasilar junction, basilar artery, and other posterior circulation remain patent. 3. Stable CT appearance of the brain  since 1156 hours today with no acute infarct evident. 4. Minimal carotid atherosclerosis, no carotid stenosis, and otherwise negative anterior circulation. Electronically Signed   By: Odessa Fleming M.D.   On: 02/19/2017 13:51   Mr Brain Wo Contrast  Result Date: 02/19/2017 CLINICAL DATA:  Stroke follow-up.  Nausea and vomiting. EXAM: MRI HEAD WITHOUT CONTRAST TECHNIQUE: Multiplanar, multiecho pulse sequences of the brain and surrounding structures were obtained without intravenous contrast. COMPARISON:  12/29/2015 FINDINGS: Brain: Large acute infarct in the inferior right cerebellum, the pica territory. No superimposed hemorrhage. Remote perforator infarct affecting the right posterior corona radiata. No hydrocephalus, shift, or masslike finding. Partially empty sella, incidental. Vascular: Abnormal right vertebral flow void, known from preceding CTA where there was changes of dissection. Skull and upper cervical spine: Negative for marrow lesion Sinuses/Orbits: Negative Other: These results were communicated to Dr Thad Ranger at 3:41 pmon 1/29/2019by text page via the [facility] messaging system. IMPRESSION: 1. 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 2. No hydrocephalus. 3. Small remote right corona radiata infarct. Electronically Signed   By: Marja Kays  Watts M.D.   On: 02/19/2017 15:51    ____________________________________________   PROCEDURES  Critical Care performed: Yes, see critical care procedure note(s)   Procedure(s) performed:   .Critical Care Performed by: Loleta Rose, MD Authorized by: Loleta Rose, MD   Critical care provider statement:    Critical care time (minutes):  60   Critical care time was exclusive of:  Separately billable procedures and treating other patients   Critical care was necessary to treat or prevent imminent or life-threatening deterioration of the following conditions:  CNS failure or compromise   Critical care was time  spent personally by me on the following activities:  Development of treatment plan with patient or surrogate, discussions with consultants, evaluation of patient's response to treatment, examination of patient, obtaining history from patient or surrogate, ordering and performing treatments and interventions, ordering and review of laboratory studies, ordering and review of radiographic studies, pulse oximetry, re-evaluation of patient's condition and review of old charts     ____________________________________________   INITIAL IMPRESSION / ASSESSMENT AND PLAN / ED COURSE  As part of my medical decision making, I reviewed the following data within the electronic MEDICAL RECORD NUMBER History obtained from family, Nursing notes reviewed and incorporated, Labs reviewed , EKG interpreted , A consult was requested and obtained from this/these consultant(s) Neurology and Notes from prior ED visits    Differential diagnosis includes, but is not limited to, hypertensive emergency, acute intracranial hemorrhage, gastritis, viral GI infection, pancreatitis, small bowel obstruction, other acute intra-abdominal infection, etc.  According to EMS the patient's fingerstick blood sugar was 160 which is reassuring.  His blood pressure for EMS was elevated at about 200/100.  Since he is in acute distress right now and Zofran has not helped after 2 rounds, I will treat him with Haldol 1 mg and Benadryl 25 mg IV as well as starting a liter bolus given all the vomiting.  Once he is no longer vomiting I will send him for a noncontrast head CT to rule out any acute bleeding and then we will determine if any other imaging of his head is required particularly given his history of CVA.  Clinical Course as of Feb 20 1999  Tue Feb 19, 2017  1149 Patient has vomited 2 more times since getting Haldol and Benadryl.  I have now ordered Reglan 10 mg IV and he is going over shortly for noncontrast head CT.  [CF]  1209 The patient is  noted to have a lactate>2. With the current information available to me, I don't think the patient is septic (or in septic shock). The lactate>2, is related to persistent vomiting and volume depletion.  Will continue to try and control pain and vomiting and replete fluids. Lactic Acid, Venous: (!!) 2.7 [CF]  1209 Likely demand ischemia due to persistent vomiting, but may represent hypertensive urgency/emergency.  Blood pressure has improved since EMS reported 200/100, however. Troponin I: (!!) 0.04 [CF]  1223 No evidence of acute bleed CT Head Wo Contrast [CF]  1243 The patient vomited again in spite of all the medication he has received however he now feels a little bit better.  He remains hypertensive but this may be his baseline and if he is having a CVA, he may benefit from permissive hypertension.  I called and spoke by phone with Dr. Thad Ranger who is on-call for neurology and under the circumstances, she recommended obtaining CTA head and neck to help evaluate for possible posterior circulation CVA.  She doubts, and  I agree, that he would be able to tolerate an MRI at this time.  Most importantly we need to look for any acute or emergent cause of his symptoms that would necessitate transfer to a higher level facility.  We will proceed with the scan as planned.  If he has any additional vomiting try another dose of Haldol but I am somewhat reluctant to give him additional medications; his QTC is very slightly prolonged at 502 ms and it would be better to avoid additional medications if possible.  I will try Decadron 10 mg IV as this may help (often helps with post-operative/anesthesia nausea) and should not affect his blood pressure or QTc.  [CF]  1408 Dr. Margo Aye with radiology called me to let me know that the patient has right-sided dominant vertebral artery dissection.  His basilar artery is patent and the left vertebral is patent. There is no evidence of acute ischemia when comparing his CT head to the CT  had obtained 2 hours ago.  Called and spoke with Dr. Thad Ranger who called [facility].  They have accepted the patient as a transfer.  Dr. Thad Ranger is currently evaluating the patient personally in the emergency department and recommended aspirin 324 mg by mouth if he can tolerate it or rectal aspirin if he cannot.  No other medications are recommended at this time.  The patient's blood pressure is down to 162 systolic and we want to allow for permissive hypertension to allow for adequate cerebral perfusion.  The patient is stable for transport and transportation needs to be arranged after Dr. Thad Ranger finishes her evaluation.  [CF]  1524 Still awaiting bed assignment.  Getting MR Brain now (per Dr. Thad Ranger).  Stable.  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   MR BRAIN WO CONTRAST [CF]  1808 Patient is still sleeping and both his wife and I think that we should allow him to keep sleeping given how uncomfortable he is when he is awake.  We are still awaiting bed placement at [facility] but they are aware of his needs and.  I have spoken with Dr. Thad Ranger at least once more in the meantime and she was also going to speak with [facility] to see if we could accelerate the bed placement.  As documented above the MRI does indicate acute infarction in the territory served by the right vertebral artery.  No other recommended treatments right now.  The patient's blood pressure has come down on its own after he became more comfortable and we will continue to not intervene one way or the other.  He is receiving IV fluids and has been made n.p.o.  He received his full dose aspirin.  [CF]  1915 The patient woke up and said he still has a headache.  He has no slurred speech and is moving all of his extremities.  We are still awaiting placement at [facility].  I reviewed the consult note written by Dr. Thad Ranger and I have ordered the hemoglobin A1c she requested.  I  will hold off on the fasting lipid panel since I do not know what they want for the timing and how long he must fast before it has been "long enough".  IV infusion continues.  Patient stable.  [CF]  1958 Transferring ED care to Dr. Marisa Severin who is aware of the situation and the plan for transfer to [facility].  [CF]  2000 Although the patient is not septic, I think  it is appropriate to repeat the lactic acid, which I have ordered.  I also ordered a repeat troponin.  [CF]    Clinical Course User Index [CF] Loleta Rose, MD    ____________________________________________  FINAL CLINICAL IMPRESSION(S) / ED DIAGNOSES  Final diagnoses:  Vertebral artery dissection (HCC)  Intractable vomiting with nausea, unspecified vomiting type  Hypertension, unspecified type  Elevated troponin I level  Elevated lactic acid level  Acute CVA (cerebrovascular accident) (HCC)     MEDICATIONS GIVEN DURING THIS VISIT:  Medications  diphenhydrAMINE (BENADRYL) 50 MG/ML injection (not administered)  metoCLOPramide (REGLAN) 5 MG/ML injection (not administered)  sodium chloride 0.9 % bolus 1,000 mL (0 mLs Intravenous Stopped 02/19/17 1506)  haloperidol lactate (HALDOL) injection 1 mg (1 mg Intravenous Given 02/19/17 1115)  diphenhydrAMINE (BENADRYL) injection 25 mg (25 mg Intravenous Given 02/19/17 1115)  metoCLOPramide (REGLAN) injection 10 mg (10 mg Intravenous Given 02/19/17 1153)  morphine 4 MG/ML injection 4 mg (4 mg Intravenous Given 02/19/17 1210)  dexamethasone (DECADRON) injection 10 mg (10 mg Intravenous Given 02/19/17 1252)  midazolam (VERSED) 5 MG/5ML injection 1 mg (1 mg Intravenous Given 02/19/17 1253)  iopamidol (ISOVUE-370) 76 % injection 75 mL (75 mLs Intravenous Contrast Given 02/19/17 1315)  sodium chloride 0.9 % bolus 1,000 mL (0 mLs Intravenous Stopped 02/19/17 1921)  aspirin chewable tablet 324 mg (324 mg Oral Given 02/19/17 1549)  0.9 %  sodium chloride infusion ( Intravenous New Bag/Given 02/19/17  1921)  morphine 4 MG/ML injection 4 mg (4 mg Intravenous Given 02/19/17 1918)     ED Discharge Orders    None       Note:  This document was prepared using Dragon voice recognition software and may include unintentional dictation errors.    Loleta Rose, MD 02/19/17 2001

## 2017-02-19 NOTE — ED Notes (Signed)
Pt complaining of severe pain in the posterior neck region, Pt states pain is staying in neck and does not travel.

## 2017-02-19 NOTE — ED Notes (Signed)
Pt has been resting, but when asked to follow commands or answer questions he has followed each one with no problems.

## 2017-02-19 NOTE — Consult Note (Signed)
Referring Physician: York Cerise    Chief Complaint: Nausea, vomiting  HPI: Kenneth [name] is an 52 y.o. male with a history of diabetes and hypertension status post stroke approximately 1 year ago.  Patient noncompliant with all medications.  Workup today at baseline.  While at work had acute onset of nausea and vomiting.  This nausea fairly intractable while in the emergency room.  No focality noted on neurological exam. Due to lethargy from medications initial NIH stroke scale of 1.  Date last known well: Date: 02/19/2017 Time last known well: Time: 10:30 tPA Given: No: NIHSS 0  Past Medical History:  Diagnosis Date  . Coronary artery disease   . Diabetes mellitus without complication (HCC)   . Hypertension     Past Surgical History:  Procedure Laterality Date  . CORONARY ANGIOPLASTY WITH STENT PLACEMENT  06/18/2008  . CORONARY ANGIOPLASTY WITH STENT PLACEMENT      Family History  Problem Relation Age of Onset  . Heart attack Mother   . Heart failure Mother   . Coronary artery disease Father   . Diabetes Father    Social History:  reports that he has been smoking cigarettes.  He has been smoking about 2.00 packs per day. he has never used smokeless tobacco. He reports that he does not drink alcohol or use drugs.  Allergies: No Known Allergies  Medications: I have reviewed the patient's current medications. Prior to Admission:  Prior to Admission medications   Medication Sig Start Date End Date Taking? Authorizing Provider  aspirin EC 81 MG tablet Take 1 tablet (81 mg total) by mouth daily. 12/30/15   Ramonita Lab, MD  atorvastatin (LIPITOR) 80 MG tablet Take 1 tablet (80 mg total) by mouth daily. 06/30/15   Anola Gurney, PA  buPROPion (WELLBUTRIN SR) 100 MG 12 hr tablet Take 100 mg by mouth 2 (two) times daily.  06/29/15 06/28/16  [provider]  clopidogrel (PLAVIX) 75 MG tablet Take 1 tablet (75 mg total) by mouth daily. 12/30/15   Ramonita Lab, MD  gemfibrozil (LOPID)  600 MG tablet Take 1 tablet (600 mg total) by mouth 2 (two) times daily before a meal. 12/30/15   Gouru, Deanna Artis, MD  glimepiride (AMARYL) 4 MG tablet Take 1 tablet (4 mg total) by mouth daily with breakfast. 12/31/15   Gouru, Deanna Artis, MD  lisinopril (PRINIVIL,ZESTRIL) 20 MG tablet Take 1 tablet (20 mg total) by mouth daily. Two tablets in the morning and 1 tablet in the evening 12/30/15   Gouru, Deanna Artis, MD  metFORMIN (GLUCOPHAGE) 1000 MG tablet Take 1,000 mg by mouth 2 (two) times daily with a meal.    [provider]  nicotine (NICODERM CQ - DOSED IN MG/24 HOURS) 21 mg/24hr patch Place 1 patch (21 mg total) onto the skin daily. 12/31/15   Ramonita Lab, MD     ROS: History obtained from the patient  General ROS: negative for - chills, fatigue, fever, night sweats, weight gain or weight loss Psychological ROS: negative for - behavioral disorder, hallucinations, memory difficulties, mood swings or suicidal ideation Ophthalmic ROS: negative for - blurry vision, double vision, eye pain or loss of vision ENT ROS: negative for - epistaxis, nasal discharge, oral lesions, sore throat, tinnitus or vertigo Allergy and Immunology ROS: negative for - hives or itchy/watery eyes Hematological and Lymphatic ROS: negative for - bleeding problems, bruising or swollen lymph nodes Endocrine ROS: negative for - galactorrhea, hair pattern changes, polydipsia/polyuria or temperature intolerance Respiratory ROS: negative for - cough,  hemoptysis, shortness of breath or wheezing Cardiovascular ROS: negative for - chest pain, dyspnea on exertion, edema or irregular heartbeat Gastrointestinal ROS: as noted in HPI Genito-Urinary ROS: negative for - dysuria, hematuria, incontinence or urinary frequency/urgency Musculoskeletal ROS: negative for - joint swelling or muscular weakness Neurological ROS: as noted in HPI Dermatological ROS: negative for rash and skin lesion changes  Physical Examination: Blood pressure (!)  170/95, pulse 73, resp. rate 18, height 5\' 8"  (1.727 m), weight 83.9 kg (185 lb), SpO2 96 %.  HEENT-  Normocephalic, no lesions, without obvious abnormality.  Normal external eye and conjunctiva.  Normal TM's bilaterally.  Normal auditory canals and external ears. Normal external nose, mucus membranes and septum.  Normal pharynx. Cardiovascular- S1, S2 normal, pulses palpable throughout   Lungs- chest clear, no wheezing, rales, normal symmetric air entry Abdomen- soft, non-tender; bowel sounds normal; no masses,  no organomegaly Extremities- no edema Lymph-no adenopathy palpable Musculoskeletal-no joint tenderness, deformity or swelling Skin-warm and dry, no hyperpigmentation, vitiligo, or suspicious lesions  Neurological Examination   Mental Status: Lethargic, oriented, thought content appropriate.  Speech fluent without evidence of aphasia.  Able to follow 3 step commands without difficulty. Cranial Nerves: II: Discs flat bilaterally; Visual fields grossly normal, pupils equal, round, reactive to light and accommodation III,IV, VI: ptosis not present, extra-ocular motions intact bilaterally V,VII: smile symmetric, facial light touch sensation normal bilaterally VIII: hearing normal bilaterally IX,X: gag reflex present XI: bilateral shoulder shrug XII: midline tongue extension Motor: Right : Upper extremity   5/5    Left:     Upper extremity   5/5  Lower extremity   5/5     Lower extremity   5/5 Tone and bulk:normal tone throughout; no atrophy noted Sensory: Pinprick and light touch intact throughout, bilaterally Deep Tendon Reflexes: 2+ and symmetric throughout Plantars: Right: mute   Left: mute Cerebellar: Normal finger-to-nose and normal heel-to-shin testing bilaterally Gait: not tested due to safety concerns    Laboratory Studies:  Basic Metabolic Panel: Recent Labs  Lab 02/19/17 1118  NA 134*  K 3.7  CL 102  CO2 22  GLUCOSE 199*  BUN 17  CREATININE 1.06  CALCIUM  9.1  MG 1.9    Liver Function Tests: Recent Labs  Lab 02/19/17 1118  AST 21  ALT 12*  ALKPHOS 80  BILITOT 0.7  PROT 7.5  ALBUMIN 4.1   Recent Labs  Lab 02/19/17 1118  LIPASE 50   No results for input(s): AMMONIA in the last 168 hours.  CBC: Recent Labs  Lab 02/19/17 1118  WBC 10.1  NEUTROABS 6.1  HGB 17.8  HCT 52.6*  MCV 85.9  PLT 310    Cardiac Enzymes: Recent Labs  Lab 02/19/17 1118  TROPONINI 0.04*    BNP: Invalid input(s): POCBNP  CBG: Recent Labs  Lab 02/19/17 1205  GLUCAP 197*    Microbiology: No results found for this or any previous visit.  Coagulation Studies: No results for input(s): LABPROT, INR in the last 72 hours.  Urinalysis: No results for input(s): COLORURINE, LABSPEC, PHURINE, GLUCOSEU, HGBUR, BILIRUBINUR, KETONESUR, PROTEINUR, UROBILINOGEN, NITRITE, LEUKOCYTESUR in the last 168 hours.  Invalid input(s): APPERANCEUR  Lipid Panel:    Component Value Date/Time   CHOL 215 (H) 12/30/2015 0423   CHOL 174 07/30/2014 0902   TRIG 2,631 (H) 12/30/2015 0423   HDL NOT REPORTED DUE TO HIGH TRIGLYCERIDES 12/30/2015 0423   HDL 31 (L) 07/30/2014 0902   CHOLHDL NOT REPORTED DUE TO HIGH TRIGLYCERIDES 12/30/2015  0423   VLDL UNABLE TO CALCULATE IF TRIGLYCERIDE OVER 400 mg/dL 40/98/1191 4782   LDLCALC UNABLE TO CALCULATE IF TRIGLYCERIDE OVER 400 mg/dL 95/62/1308 6578   LDLCALC 111 (H) 07/30/2014 0902    HgbA1C:  Lab Results  Component Value Date   HGBA1C 9.5 (H) 12/29/2015    Urine Drug Screen:      Component Value Date/Time   LABOPIA NONE DETECTED 12/29/2015 0823   COCAINSCRNUR NONE DETECTED 12/29/2015 0823   LABBENZ NONE DETECTED 12/29/2015 0823   AMPHETMU NONE DETECTED 12/29/2015 0823   THCU NONE DETECTED 12/29/2015 0823   LABBARB NONE DETECTED 12/29/2015 0823    Alcohol Level: No results for input(s): ETH in the last 168 hours.  Other results: EKG: sinus rhythm at 78 bpm.  Imaging: Ct Angio Head W Or Wo  Contrast  Result Date: 02/19/2017 CLINICAL DATA:  52 year old male with severe posterior neck pain which is non radiating. Nausea and vomiting beginning abruptly today. EXAM: CT ANGIOGRAPHY HEAD AND NECK TECHNIQUE: Multidetector CT imaging of the head and neck was performed using the standard protocol during bolus administration of intravenous contrast. Multiplanar CT image reconstructions and MIPs were obtained to evaluate the vascular anatomy. Carotid stenosis measurements (when applicable) are obtained utilizing NASCET criteria, using the distal internal carotid diameter as the denominator. CONTRAST:  75mL ISOVUE-370 IOPAMIDOL (ISOVUE-370) INJECTION 76% COMPARISON:  Head CT without contrast 1156 hours today. Brain MRI 12/29/2015. FINDINGS: CTA NECK Skeleton: Preserved cervical lordosis, perhaps mildly exaggerated. Moderate to severe chronic facet hypertrophy on the right at C2-C3 with trace vacuum facet. Minimal to mild cervical spine degeneration elsewhere. No acute osseous abnormality identified. Upper chest: Negative visible lungs.  Normal superior mediastinum. Other neck: Thyroid is negative for age; there a subcentimeter hypodense nodule in the right lobe which does not meet consensus criteria for further evaluation. Negative neck soft tissues otherwise. No lymphadenopathy. Aortic arch: 3 vessel arch configuration. Minimal arch atherosclerosis. Right carotid system: Negative. Left carotid system: Mild soft and calcified atherosclerosis at the left carotid bifurcation. No associated stenosis. Otherwise negative. Vertebral arteries: Normal proximal right subclavian artery. The very proximal right vertebral artery origin is patent, but enhancement of the vessel abruptly terminates in a triangular shaped area just beyond the origin (series 7, image 121). The remainder of the right V1 segment is not enhancing, but there is intermittent enhancement in the right V 2 segment (such as series 6, image 255 where the  lumen appears irregular). The distal V2 segment is enhancing with an oblique or intraluminal filling defect (series 6, image 209). Approximately half of the vessel lumen appears to be enhancing at the C2 vertebral level. The V3 segment is poorly enhancing. The V4 segment is described below. Normal proximal left subclavian artery and left vertebral artery origin. The left vertebral artery is enhancing and appears normal to the skull base. CTA HEAD Posterior circulation: The distal left vertebral artery appears diminutive beyond the left PICA origin. The more dominant appearing distal right vertebral artery demonstrates poor enhancement in the proximal V4 segment but is moderately enhancing distally, and the vertebrobasilar junction is patent. The basilar artery is patent. SCA and PCA origins are patent. There is a fetal type right PCA origin. The left posterior communicating artery is also present. Bilateral PCA branches are within normal limits. Anterior circulation: Both ICA siphons are patent. Mild calcified plaque only on the right. No siphon stenosis. Normal ophthalmic and posterior communicating artery origins. Patent carotid termini. Normal MCA and ACA origins. Anterior communicating  artery and bilateral ACA branches are within normal limits. Bilateral MCA M1 segments, MCA bifurcations, and bilateral MCA branches are within normal limits. Venous sinuses: Patent. Anatomic variants: Dominant appearance of the distal right vertebral artery, the left is diminutive beyond the left PICA origin. Fetal type right PCA origin. Delayed phase: Posterior fossa gray-white matter differentiation remains normal. Stable gray-white matter differentiation elsewhere. No acute infarct is evident by C No abnormal enhancement identified. Review of the MIP images confirms the above findings IMPRESSION: 1. The right vertebral artery is abnormal throughout its course. The appearance combined with history of severe posterior neck pain  is most compatible with Acute Right Vertebral Artery Dissection. 2. Unfortunately the distal right vertebral artery is dominant. The left vertebral is normal but diminutive beyond the left PICA. Still, the vertebrobasilar junction, basilar artery, and other posterior circulation remain patent. 3. Stable CT appearance of the brain since 1156 hours today with no acute infarct evident. 4. Minimal carotid atherosclerosis, no carotid stenosis, and otherwise negative anterior circulation. Electronically Signed   By: Odessa Fleming M.D.   On: 02/19/2017 13:51   Ct Head Wo Contrast  Result Date: 02/19/2017 CLINICAL DATA:  Worst headache of life with nausea and vomiting. Prior stroke. EXAM: CT HEAD WITHOUT CONTRAST TECHNIQUE: Contiguous axial images were obtained from the base of the skull through the vertex without intravenous contrast. COMPARISON:  Head CT and MRI 12/29/2015 FINDINGS: Brain: There is a chronic infarct in the posterior limb of the right internal capsule extending to the posterior lentiform nucleus, acute on the prior MRI. There is no evidence of acute infarct, intracranial hemorrhage, mass, midline shift, or extra-axial fluid collection. The ventricles and sulci are normal. A partially empty sella is again noted. Vascular: No hyperdense vessel. Skull: No fracture or focal osseous lesion. Sinuses/Orbits: Visualized paranasal sinuses and mastoid air cells are clear. Orbits are unremarkable. Other: None. IMPRESSION: 1. No evidence of acute intracranial abnormality. 2. 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 Electronically Signed   By: Sebastian Ache M.D.   On: 02/19/2017 12:10   Ct Angio Neck W And/or Wo Contrast  Result Date: 02/19/2017 CLINICAL DATA:  52 year old male with severe posterior neck pain which is non radiating. Nausea and vomiting beginning abruptly today. EXAM: CT ANGIOGRAPHY HEAD AND NECK TECHNIQUE: Multidetector CT imaging of the head and neck was performed using the  standard protocol during bolus administration of intravenous contrast. Multiplanar CT image reconstructions and MIPs were obtained to evaluate the vascular anatomy. Carotid stenosis measurements (when applicable) are obtained utilizing NASCET criteria, using the distal internal carotid diameter as the denominator. CONTRAST:  75mL ISOVUE-370 IOPAMIDOL (ISOVUE-370) INJECTION 76% COMPARISON:  Head CT without contrast 1156 hours today. Brain MRI 12/29/2015. FINDINGS: CTA NECK Skeleton: Preserved cervical lordosis, perhaps mildly exaggerated. Moderate to severe chronic facet hypertrophy on the right at C2-C3 with trace vacuum facet. Minimal to mild cervical spine degeneration elsewhere. No acute osseous abnormality identified. Upper chest: Negative visible lungs.  Normal superior mediastinum. Other neck: Thyroid is negative for age; there a subcentimeter hypodense nodule in the right lobe which does not meet consensus criteria for further evaluation. Negative neck soft tissues otherwise. No lymphadenopathy. Aortic arch: 3 vessel arch configuration. Minimal arch atherosclerosis. Right carotid system: Negative. Left carotid system: Mild soft and calcified atherosclerosis at the left carotid bifurcation. No associated stenosis. Otherwise negative. Vertebral arteries: Normal proximal right subclavian artery. The very proximal right vertebral artery origin is patent, but enhancement of the vessel abruptly terminates in  a triangular shaped area just beyond the origin (series 7, image 121). The remainder of the right V1 segment is not enhancing, but there is intermittent enhancement in the right V 2 segment (such as series 6, image 255 where the lumen appears irregular). The distal V2 segment is enhancing with an oblique or intraluminal filling defect (series 6, image 209). Approximately half of the vessel lumen appears to be enhancing at the C2 vertebral level. The V3 segment is poorly enhancing. The V4 segment is described  below. Normal proximal left subclavian artery and left vertebral artery origin. The left vertebral artery is enhancing and appears normal to the skull base. CTA HEAD Posterior circulation: The distal left vertebral artery appears diminutive beyond the left PICA origin. The more dominant appearing distal right vertebral artery demonstrates poor enhancement in the proximal V4 segment but is moderately enhancing distally, and the vertebrobasilar junction is patent. The basilar artery is patent. SCA and PCA origins are patent. There is a fetal type right PCA origin. The left posterior communicating artery is also present. Bilateral PCA branches are within normal limits. Anterior circulation: Both ICA siphons are patent. Mild calcified plaque only on the right. No siphon stenosis. Normal ophthalmic and posterior communicating artery origins. Patent carotid termini. Normal MCA and ACA origins. Anterior communicating artery and bilateral ACA branches are within normal limits. Bilateral MCA M1 segments, MCA bifurcations, and bilateral MCA branches are within normal limits. Venous sinuses: Patent. Anatomic variants: Dominant appearance of the distal right vertebral artery, the left is diminutive beyond the left PICA origin. Fetal type right PCA origin. Delayed phase: Posterior fossa gray-white matter differentiation remains normal. Stable gray-white matter differentiation elsewhere. No acute infarct is evident by C No abnormal enhancement identified. Review of the MIP images confirms the above findings IMPRESSION: 1. The right vertebral artery is abnormal throughout its course. 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 2. Unfortunately the distal right vertebral artery is dominant. The left vertebral is normal but diminutive beyond the left PICA. Still, the vertebrobasilar junction, basilar artery, and other posterior circulation remain  patent. 3. Stable CT appearance of the brain since 1156 hours today with no acute infarct evident. 4. Minimal carotid atherosclerosis, no carotid stenosis, and otherwise negative anterior circulation. Electronically Signed   By: Odessa FlemingH  Hall M.D.   On: 02/19/2017 13:51    Assessment: 52 y.o. male presenting with intractable nausea and vomiting.  Nonfocal neurological examination.  Head CT reviewed and shows no acute changes.  CTA of the head and neck shows what appears to be a right vertebral dissection.  Patient noncompliant with home medications.  Smoking as well.    Stroke Risk Factors - diabetes mellitus, hypertension and smoking  Plan: 1. HgbA1c, fasting lipid panel 2. MRI  of the brain without contrast 3. Echocardiogram 4. Prophylactic therapy-Antiplatelet med: Aspirin - dose 325mg  daily.  First dose now 5. NPO until RN stroke swallow screen 6. Telemetry monitoring 7. Frequent neuro checks 8. [facility].  Case discussed with Dr. Laurence SlateAroor.     Case and disposition discussed with Dr. Whitney PostForbach  Attilio Zeitler, MD Neurology [phone] 02/19/2017, 2:19 PM

## 2017-02-19 NOTE — ED Notes (Signed)
[facility]

## 2017-02-19 NOTE — ED Notes (Signed)
Pt to ED via EMS c/o worst headache of his life posterior head and into neck x1 week.  Patient had sudden episodes of vomiting 1hr PTA.  Pt was given total 8mg  zofran en route and actively vomiting upon arrival.

## 2017-02-19 NOTE — ED Notes (Signed)
Patient transported to MRI 

## 2017-02-19 NOTE — ED Notes (Signed)
Idelle Leechamela Ginger (wife) [phone]

## 2017-02-20 ENCOUNTER — Inpatient Hospital Stay (HOSPITAL_COMMUNITY): Payer: Self-pay

## 2017-02-20 ENCOUNTER — Inpatient Hospital Stay (HOSPITAL_COMMUNITY)
Admission: AD | Admit: 2017-02-20 | Discharge: 2017-02-27 | DRG: 064 | Disposition: A | Discharge status: Home / Self Care | Payer: Self-pay | Source: Other Acute Inpatient Hospital | Attending: Family Medicine | Admitting: Family Medicine

## 2017-02-20 ENCOUNTER — Encounter (HOSPITAL_COMMUNITY): Payer: Self-pay

## 2017-02-20 DIAGNOSIS — Z7982 Long term (current) use of aspirin: Secondary | ICD-10-CM

## 2017-02-20 DIAGNOSIS — I10 Essential (primary) hypertension: Secondary | ICD-10-CM | POA: Diagnosis present

## 2017-02-20 DIAGNOSIS — I6381 Other cerebral infarction due to occlusion or stenosis of small artery: Principal | ICD-10-CM | POA: Diagnosis present

## 2017-02-20 DIAGNOSIS — F17200 Nicotine dependence, unspecified, uncomplicated: Secondary | ICD-10-CM

## 2017-02-20 DIAGNOSIS — G911 Obstructive hydrocephalus: Secondary | ICD-10-CM

## 2017-02-20 DIAGNOSIS — E785 Hyperlipidemia, unspecified: Secondary | ICD-10-CM | POA: Diagnosis present

## 2017-02-20 DIAGNOSIS — N2889 Other specified disorders of kidney and ureter: Secondary | ICD-10-CM | POA: Diagnosis present

## 2017-02-20 DIAGNOSIS — E1165 Type 2 diabetes mellitus with hyperglycemia: Secondary | ICD-10-CM

## 2017-02-20 DIAGNOSIS — I639 Cerebral infarction, unspecified: Secondary | ICD-10-CM | POA: Diagnosis present

## 2017-02-20 DIAGNOSIS — R402143 Coma scale, eyes open, spontaneous, at hospital admission: Secondary | ICD-10-CM | POA: Diagnosis present

## 2017-02-20 DIAGNOSIS — R402253 Coma scale, best verbal response, oriented, at hospital admission: Secondary | ICD-10-CM | POA: Diagnosis present

## 2017-02-20 DIAGNOSIS — Z8249 Family history of ischemic heart disease and other diseases of the circulatory system: Secondary | ICD-10-CM

## 2017-02-20 DIAGNOSIS — Z9114 Patient's other noncompliance with medication regimen: Secondary | ICD-10-CM

## 2017-02-20 DIAGNOSIS — I63211 Cerebral infarction due to unspecified occlusion or stenosis of right vertebral arteries: Secondary | ICD-10-CM

## 2017-02-20 DIAGNOSIS — K5900 Constipation, unspecified: Secondary | ICD-10-CM | POA: Diagnosis not present

## 2017-02-20 DIAGNOSIS — Z452 Encounter for adjustment and management of vascular access device: Secondary | ICD-10-CM

## 2017-02-20 DIAGNOSIS — D751 Secondary polycythemia: Secondary | ICD-10-CM | POA: Diagnosis present

## 2017-02-20 DIAGNOSIS — Z833 Family history of diabetes mellitus: Secondary | ICD-10-CM

## 2017-02-20 DIAGNOSIS — R29701 NIHSS score 1: Secondary | ICD-10-CM | POA: Diagnosis present

## 2017-02-20 DIAGNOSIS — I7774 Dissection of vertebral artery: Secondary | ICD-10-CM

## 2017-02-20 DIAGNOSIS — E876 Hypokalemia: Secondary | ICD-10-CM | POA: Diagnosis not present

## 2017-02-20 DIAGNOSIS — K219 Gastro-esophageal reflux disease without esophagitis: Secondary | ICD-10-CM | POA: Diagnosis present

## 2017-02-20 DIAGNOSIS — F17210 Nicotine dependence, cigarettes, uncomplicated: Secondary | ICD-10-CM | POA: Diagnosis present

## 2017-02-20 DIAGNOSIS — E1169 Type 2 diabetes mellitus with other specified complication: Secondary | ICD-10-CM

## 2017-02-20 DIAGNOSIS — G936 Cerebral edema: Secondary | ICD-10-CM | POA: Diagnosis present

## 2017-02-20 DIAGNOSIS — I2510 Atherosclerotic heart disease of native coronary artery without angina pectoris: Secondary | ICD-10-CM | POA: Diagnosis present

## 2017-02-20 DIAGNOSIS — R402363 Coma scale, best motor response, obeys commands, at hospital admission: Secondary | ICD-10-CM | POA: Diagnosis present

## 2017-02-20 DIAGNOSIS — I361 Nonrheumatic tricuspid (valve) insufficiency: Secondary | ICD-10-CM

## 2017-02-20 DIAGNOSIS — Z955 Presence of coronary angioplasty implant and graft: Secondary | ICD-10-CM

## 2017-02-20 LAB — RAPID URINE DRUG SCREEN, HOSP PERFORMED
Amphetamines: NOT DETECTED
BENZODIAZEPINES: POSITIVE — AB
Barbiturates: POSITIVE — AB
COCAINE: NOT DETECTED
OPIATES: POSITIVE — AB
TETRAHYDROCANNABINOL: NOT DETECTED

## 2017-02-20 LAB — ECHOCARDIOGRAM COMPLETE
HEIGHTINCHES: 70 in
WEIGHTICAEL: 2694.9 [oz_av]

## 2017-02-20 LAB — CBC
HCT: 48.4 % (ref 39.0–52.0)
Hemoglobin: 17 g/dL (ref 13.0–17.0)
MCH: 30.3 pg (ref 26.0–34.0)
MCHC: 35.1 g/dL (ref 30.0–36.0)
MCV: 86.3 fL (ref 78.0–100.0)
PLATELETS: 271 10*3/uL (ref 150–400)
RBC: 5.61 MIL/uL (ref 4.22–5.81)
RDW: 13.1 % (ref 11.5–15.5)
WBC: 21.2 10*3/uL — AB (ref 4.0–10.5)

## 2017-02-20 LAB — CREATININE, SERUM
CREATININE: 0.97 mg/dL (ref 0.61–1.24)
GFR calc non Af Amer: >60 mL/min (ref >60)

## 2017-02-20 LAB — GLUCOSE, CAPILLARY
GLUCOSE-CAPILLARY: 154 mg/dL — AB (ref 65–99)
Glucose-Capillary: 168 mg/dL — ABNORMAL HIGH (ref 65–99)

## 2017-02-20 LAB — SODIUM
SODIUM: 139 mmol/L (ref 135–145)
Sodium: 140 mmol/L (ref 135–145)
Sodium: 139 mmol/L (ref 135–145)

## 2017-02-20 LAB — LACTIC ACID, PLASMA: Lactic Acid, Venous: 1.7 mmol/L (ref 0.5–1.9)

## 2017-02-20 LAB — MRSA PCR SCREENING: MRSA by PCR: NEGATIVE

## 2017-02-20 LAB — TSH: TSH: 0.765 u[IU]/mL (ref 0.350–4.500)

## 2017-02-20 MED ORDER — NICOTINE 14 MG/24HR TD PT24
14.0000 mg | MEDICATED_PATCH | Freq: Every day | TRANSDERMAL | Status: DC | PRN
  Administered 2017-02-21 – 2017-02-22 (×2): 14 mg via TRANSDERMAL
  Filled 2017-02-20 (×4): qty 1

## 2017-02-20 MED ORDER — MORPHINE SULFATE (PF) 4 MG/ML IV SOLN
2.0000 mg | INTRAVENOUS | Status: DC | PRN
  Administered 2017-02-20 – 2017-02-25 (×25): 2 mg via INTRAVENOUS
  Filled 2017-02-20 (×27): qty 1

## 2017-02-20 MED ORDER — SODIUM CHLORIDE 3 % IV SOLN
INTRAVENOUS | Status: DC
  Administered 2017-02-20: 75 mL/h via INTRAVENOUS
  Filled 2017-02-20 (×4): qty 500

## 2017-02-20 MED ORDER — SENNOSIDES-DOCUSATE SODIUM 8.6-50 MG PO TABS: 1.0000 | ORAL_TABLET | Freq: Every evening | ORAL | Status: DC | PRN

## 2017-02-20 MED ORDER — NICOTINE 14 MG/24HR TD PT24: 14.0000 mg | MEDICATED_PATCH | Freq: Every day | TRANSDERMAL | Status: DC

## 2017-02-20 MED ORDER — HEPARIN SODIUM (PORCINE) 5000 UNIT/ML IJ SOLN
5000.0000 [IU] | Freq: Three times a day (TID) | INTRAMUSCULAR | Status: DC
  Administered 2017-02-20 – 2017-02-26 (×21): 5000 [IU] via SUBCUTANEOUS
  Filled 2017-02-20 (×21): qty 1

## 2017-02-20 MED ORDER — ACETAMINOPHEN 325 MG PO TABS: 650.0000 mg | ORAL_TABLET | Freq: Four times a day (QID) | ORAL | Status: DC | PRN

## 2017-02-20 MED ORDER — GI COCKTAIL ~~LOC~~
30.0000 mL | Freq: Once | ORAL | Status: AC
  Administered 2017-02-20: 30 mL via ORAL
  Filled 2017-02-20: qty 30

## 2017-02-20 MED ORDER — KETOROLAC TROMETHAMINE 30 MG/ML IJ SOLN
30.0000 mg | Freq: Once | INTRAMUSCULAR | Status: AC
  Administered 2017-02-20: 30 mg via INTRAVENOUS
  Filled 2017-02-20: qty 1

## 2017-02-20 MED ORDER — ACETAMINOPHEN 650 MG RE SUPP: 650.0000 mg | RECTAL | Status: DC | PRN

## 2017-02-20 MED ORDER — ACETAMINOPHEN 160 MG/5ML PO SOLN: 650.0000 mg | ORAL | Status: DC | PRN

## 2017-02-20 MED ORDER — PANTOPRAZOLE SODIUM 40 MG IV SOLR
40.0000 mg | Freq: Two times a day (BID) | INTRAVENOUS | Status: DC
  Administered 2017-02-20 – 2017-02-21 (×3): 40 mg via INTRAVENOUS
  Filled 2017-02-20 (×2): qty 40

## 2017-02-20 MED ORDER — STROKE: EARLY STAGES OF RECOVERY BOOK
Freq: Once | Status: DC
  Filled 2017-02-20: qty 1

## 2017-02-20 MED ORDER — GADOBENATE DIMEGLUMINE 529 MG/ML IV SOLN
15.0000 mL | Freq: Once | INTRAVENOUS | Status: AC | PRN
  Administered 2017-02-20: 15 mL via INTRAVENOUS

## 2017-02-20 MED ORDER — ATORVASTATIN CALCIUM 40 MG PO TABS
40.0000 mg | ORAL_TABLET | Freq: Every day | ORAL | Status: DC
  Administered 2017-02-20: 40 mg via ORAL
  Filled 2017-02-20: qty 1

## 2017-02-20 MED ORDER — METOCLOPRAMIDE HCL 5 MG/ML IJ SOLN
10.0000 mg | Freq: Once | INTRAMUSCULAR | Status: AC
  Administered 2017-02-20: 10 mg via INTRAVENOUS
  Filled 2017-02-20: qty 2

## 2017-02-20 MED ORDER — INSULIN ASPART 100 UNIT/ML ~~LOC~~ SOLN
0.0000 [IU] | Freq: Three times a day (TID) | SUBCUTANEOUS | Status: DC
  Administered 2017-02-20: 2 [IU] via SUBCUTANEOUS
  Administered 2017-02-21 (×3): 1 [IU] via SUBCUTANEOUS
  Administered 2017-02-22: 3 [IU] via SUBCUTANEOUS
  Administered 2017-02-22: 5 [IU] via SUBCUTANEOUS
  Administered 2017-02-23 – 2017-02-24 (×4): 2 [IU] via SUBCUTANEOUS
  Administered 2017-02-24: 5 [IU] via SUBCUTANEOUS
  Administered 2017-02-24: 2 [IU] via SUBCUTANEOUS
  Administered 2017-02-25: 3 [IU] via SUBCUTANEOUS
  Administered 2017-02-25: 2 [IU] via SUBCUTANEOUS
  Administered 2017-02-25 – 2017-02-26 (×2): 1 [IU] via SUBCUTANEOUS
  Administered 2017-02-26 (×2): 2 [IU] via SUBCUTANEOUS

## 2017-02-20 MED ORDER — SODIUM CHLORIDE 3 % IV SOLN
INTRAVENOUS | Status: DC
  Filled 2017-02-20 (×2): qty 500

## 2017-02-20 MED ORDER — DIPHENHYDRAMINE HCL 50 MG/ML IJ SOLN
25.0000 mg | Freq: Once | INTRAMUSCULAR | Status: AC
  Administered 2017-02-20: 25 mg via INTRAVENOUS
  Filled 2017-02-20: qty 1

## 2017-02-20 MED ORDER — SODIUM CHLORIDE 1 G PO TABS
2.0000 g | ORAL_TABLET | Freq: Two times a day (BID) | ORAL | Status: DC
Start: 2017-02-20 — End: 2017-02-22
  Administered 2017-02-20 – 2017-02-21 (×4): 2 g via ORAL
  Filled 2017-02-20 (×5): qty 2

## 2017-02-20 MED ORDER — ACETAMINOPHEN 325 MG PO TABS
650.0000 mg | ORAL_TABLET | ORAL | Status: DC | PRN
  Administered 2017-02-21 – 2017-02-26 (×4): 650 mg via ORAL
  Filled 2017-02-20 (×5): qty 2

## 2017-02-20 MED ORDER — ONDANSETRON HCL 4 MG/2ML IJ SOLN
4.0000 mg | Freq: Four times a day (QID) | INTRAMUSCULAR | Status: DC | PRN
  Administered 2017-02-20 – 2017-02-25 (×7): 4 mg via INTRAVENOUS
  Filled 2017-02-20 (×6): qty 2

## 2017-02-20 MED ORDER — CLOPIDOGREL BISULFATE 75 MG PO TABS
75.0000 mg | ORAL_TABLET | Freq: Every day | ORAL | Status: DC
  Administered 2017-02-20 – 2017-02-27 (×8): 75 mg via ORAL
  Filled 2017-02-20 (×8): qty 1

## 2017-02-20 NOTE — ED Provider Notes (Signed)
12:00 AM I assumed care of the patient from Dr. Marisa SeverinSiadecki 11:30 PM.  Patient very irate at this time secondary to the fact that he has not been [facility] thus far.  Patient also very irate demanding something to eat and drink at this time.  I spoke with the transfer center at [facility] who informed me that they do not have a bed available for the patient and cannot anticipate 1 will become available.  Spoke with the patient and informed him of this and [facility].  Patient then discussed with the [facility] transfer center who also informed me that they are on diversion.  Patient notified of this fact.  Currently awaiting [facility].  1:10 AM  [facility] have [facility] neurology beds.   1:30 AM  [facility] including bed control inform me that there were no available beds for the patient   4:10AM [facility]      Mical Brun, [place] N, MD 02/20/17 [phone]

## 2017-02-20 NOTE — Progress Notes (Signed)
[facility] admitting paged on arrival, advised that pt needs to be admitted by neuro as Dr. Wilford CornerArora is physician accepting transfer. Paged Neuro coverage who advised that Neuro only admits overnight pts if they are in need of neurovascular surgery or TPA administration. Paged [facility] back to inform them of neuro's disposition, was informed that Neuro needs to call [facility] to ask for consult to admit patient [facility] with Neuro consulting.  Paged neuro MD Lindzen again to inform of [facility] disposition.  Gave MD Otelia LimesLindzen [facility] pager number. Attending changed to Dr. Roda ShuttersXu in Epic by [facility].  At present no admitting physician has been to see pt or write admitting orders.  Informed day RN of situation. Pt is stable and resting at this time but is aggravated that he has no been seen and does not know what his medical plan/course of action will be.

## 2017-02-20 NOTE — Progress Notes (Signed)
MD at bedside, new orders placed for protonix and morphine per MD to be administered now so that patient may go to MRI.

## 2017-02-20 NOTE — Consult Note (Signed)
[facility] Medical Consultation  Kenneth NISHI WUJ:[id number] DOB: 31-Oct-1965 DOA: 02/20/2017 PCP: Anola Gurney, PA   Requesting physician: Dr. Laurence Slate Date of consultation: 02/20/17 Reason for consultation: Medical Management  Impression/Recommendations Active Problems:   Ischemic stroke [facility]) Neurology service manages and will follow their recommendations   CAD - currently stable, enzymes negative, continue home medications and follow clinical symprtoms     Hyperlipidemia - lipid profile is pending. Cosider statin therapy and adjust the doses if needed. Goal LDL </= 70 mg/dcL   HTN - continue to monitor BP with permissive hypertension, follow neurology recommendations   DM type II - most recent HgbA1C is 7.7% on 02/19/17 Hold oral medications while hospitalized Continue diabetic diet, monitor FSBS QACHS, add sliding scale insulin   Tobacco abuse and medication non-compliance - long time tobacco user, will provide tobacco cessation education and order Nicoderm patch if needed for tobacco craving,. Does not take medications as recommended due to cost, needs all his home medications renewed. .  I will followup again tomorrow. Please contact me if I can be of assistance in the meanwhile. Thank you for this consultation.   Chief Complaint: headache, posterior neck pain, nausea and vomiting  HPI:  52 yo male presented to the [facility] via Care link in transfer from [facility] with diagnosis of acute stroke. He has a previous history of ischemic CVA of the right PCA (2017), CAD with previous intervention (BMS to RCA in 2010), hyperlipidemia, hypertension, DM type II. Patient reported having headache off and on for nearly a week gradually worsening and yesterday he felt like it was the worst headache in his life. He developed acute onset of severe posterior neck pain accompanied by abrupt intractable nausea and vomiting while at work yesterday. Patient was taken to the [facility], seen by neurology  service and transferred to the [facility] for possible intervention.  Upon transfer his NIHSS was 0(zero),he underwent head CT that revealed abnormality of the dominant right vertebral artery most compatible with acute dissection that probably explains patient's symptoms of posterior neck pain. Brain MRA showed acute right PICA territory infarct with known underlying right vertebral dissection.  Review of Systems: Patient still experiences mild headache and neck discomfort, nausea and vomiting resolved. Patient denied chest pain, SOB, cough, abdominal pain.  The remaining ROS is negative    Past Medical History:  Diagnosis Date  . Coronary artery disease   . Diabetes mellitus without complication (HCC)   . Hypertension    Past Surgical History:  Procedure Laterality Date  . CORONARY ANGIOPLASTY WITH STENT PLACEMENT  06/18/2008  . CORONARY ANGIOPLASTY WITH STENT PLACEMENT     Social History:  reports that he has been smoking cigarettes.  He has been smoking about 2.00 packs per day. he has never used smokeless tobacco. He reports that he does not drink alcohol or use drugs.  No Known Allergies Family History  Problem Relation Age of Onset  . Heart attack Mother   . Heart failure Mother   . Coronary artery disease Father   . Diabetes Father     Prior to Admission medications   Medication Sig Start Date End Date Taking? Authorizing Provider  aspirin EC 81 MG tablet Take 1 tablet (81 mg total) by mouth daily. 12/30/15   Ramonita Lab, MD  atorvastatin (LIPITOR) 80 MG tablet Take 1 tablet (80 mg total) by mouth daily. 06/30/15   Anola Gurney, PA  buPROPion (WELLBUTRIN SR) 100 MG 12 hr tablet Take 100 mg by mouth  2 (two) times daily.  06/29/15 06/28/16  [provider]  clopidogrel (PLAVIX) 75 MG tablet Take 1 tablet (75 mg total) by mouth daily. 12/30/15   Ramonita Lab, MD  gemfibrozil (LOPID) 600 MG tablet Take 1 tablet (600 mg total) by mouth 2 (two) times daily before a meal. 12/30/15    Gouru, Deanna Artis, MD  glimepiride (AMARYL) 4 MG tablet Take 1 tablet (4 mg total) by mouth daily with breakfast. 12/31/15   Gouru, Deanna Artis, MD  lisinopril (PRINIVIL,ZESTRIL) 20 MG tablet Take 1 tablet (20 mg total) by mouth daily. Two tablets in the morning and 1 tablet in the evening 12/30/15   Gouru, Deanna Artis, MD  metFORMIN (GLUCOPHAGE) 1000 MG tablet Take 1,000 mg by mouth 2 (two) times daily with a meal.    [provider]  nicotine (NICODERM CQ - DOSED IN MG/24 HOURS) 21 mg/24hr patch Place 1 patch (21 mg total) onto the skin daily. 12/31/15   Ramonita Lab, MD   Physical Exam: Blood pressure (!) 147/90, pulse 87, temperature (!) 97.5 F (36.4 C), temperature source Oral, resp. rate 20, height 5\' 10"  (1.778 m), weight 76.4 kg (168 lb 6.9 oz), SpO2 99 %. Vitals:   02/20/17 0501 02/20/17 0720  BP: (!) 168/90 (!) 147/90  Pulse: 89 87  Resp: 14 20  Temp:  (!) 97.5 F (36.4 C)  SpO2: 97% 99%     General: well nourished well developed male in no acute distress  Eyes: PERRLA, no conjunctival erythema or discharge noticed  ENT: no erythema, mucous membranes intact, external auditory canals are with no swelling, hearing intact  Neck: no lymphadenopathy or masses appreciated  Cardiovascular: RRR, no significant murmurs, no gallops or rub  Respiratory: CTA bilaterally without adventitious sounds  Abdomen: soft, non-tender, non-distended, no organomegaly or masses appreciated  Skin: dry, warm to touch, no lesions or discoloration observed  Musculoskeletal: no joint tenderness, swelling, nodules observed.   Psychiatric: slightly irritated and frustrated as he does not know what is the plan of care  Neurologic: Alert, oriented x 3, CN II-XII intact, muscle strength preserved and tone is normal, DTR normal throughout  Labs on Admission:  Basic Metabolic Panel: Recent Labs  Lab 02/19/17 1118  NA 134*  K 3.7  CL 102  CO2 22  GLUCOSE 199*  BUN 17  CREATININE 1.06  CALCIUM 9.1   MG 1.9   Liver Function Tests: Recent Labs  Lab 02/19/17 1118  AST 21  ALT 12*  ALKPHOS 80  BILITOT 0.7  PROT 7.5  ALBUMIN 4.1   Recent Labs  Lab 02/19/17 1118  LIPASE 50   No results for input(s): AMMONIA in the last 168 hours. CBC: Recent Labs  Lab 02/19/17 1118  WBC 10.1  NEUTROABS 6.1  HGB 17.8  HCT 52.6*  MCV 85.9  PLT 310   Cardiac Enzymes: Recent Labs  Lab 02/19/17 1118 02/19/17 1957  TROPONINI 0.04* 0.05*   BNP: Invalid input(s): POCBNP CBG: Recent Labs  Lab 02/19/17 1205  GLUCAP 197*    Radiological Exams on Admission: Ct Angio Head W Or Wo Contrast  Result Date: 02/19/2017 CLINICAL DATA:  52 year old male with severe posterior neck pain which is non radiating. Nausea and vomiting beginning abruptly today. EXAM: CT ANGIOGRAPHY HEAD AND NECK TECHNIQUE: Multidetector CT imaging of the head and neck was performed using the standard protocol during bolus administration of intravenous contrast. Multiplanar CT image reconstructions and MIPs were obtained to evaluate the vascular anatomy. Carotid stenosis measurements (when applicable)  are obtained utilizing NASCET criteria, using the distal internal carotid diameter as the denominator. CONTRAST:  75mL ISOVUE-370 IOPAMIDOL (ISOVUE-370) INJECTION 76% COMPARISON:  Head CT without contrast 1156 hours today. Brain MRI 12/29/2015. FINDINGS: CTA NECK Skeleton: Preserved cervical lordosis, perhaps mildly exaggerated. Moderate to severe chronic facet hypertrophy on the right at C2-C3 with trace vacuum facet. Minimal to mild cervical spine degeneration elsewhere. No acute osseous abnormality identified. Upper chest: Negative visible lungs.  Normal superior mediastinum. Other neck: Thyroid is negative for age; there a subcentimeter hypodense nodule in the right lobe which does not meet consensus criteria for further evaluation. Negative neck soft tissues otherwise. No lymphadenopathy. Aortic arch: 3 vessel arch  configuration. Minimal arch atherosclerosis. Right carotid system: Negative. Left carotid system: Mild soft and calcified atherosclerosis at the left carotid bifurcation. No associated stenosis. Otherwise negative. Vertebral arteries: Normal proximal right subclavian artery. The very proximal right vertebral artery origin is patent, but enhancement of the vessel abruptly terminates in a triangular shaped area just beyond the origin (series 7, image 121). The remainder of the right V1 segment is not enhancing, but there is intermittent enhancement in the right V 2 segment (such as series 6, image 255 where the lumen appears irregular). The distal V2 segment is enhancing with an oblique or intraluminal filling defect (series 6, image 209). Approximately half of the vessel lumen appears to be enhancing at the C2 vertebral level. The V3 segment is poorly enhancing. The V4 segment is described below. Normal proximal left subclavian artery and left vertebral artery origin. The left vertebral artery is enhancing and appears normal to the skull base. CTA HEAD Posterior circulation: The distal left vertebral artery appears diminutive beyond the left PICA origin. The more dominant appearing distal right vertebral artery demonstrates poor enhancement in the proximal V4 segment but is moderately enhancing distally, and the vertebrobasilar junction is patent. The basilar artery is patent. SCA and PCA origins are patent. There is a fetal type right PCA origin. The left posterior communicating artery is also present. Bilateral PCA branches are within normal limits. Anterior circulation: Both ICA siphons are patent. Mild calcified plaque only on the right. No siphon stenosis. Normal ophthalmic and posterior communicating artery origins. Patent carotid termini. Normal MCA and ACA origins. Anterior communicating artery and bilateral ACA branches are within normal limits. Bilateral MCA M1 segments, MCA bifurcations, and bilateral MCA  branches are within normal limits. Venous sinuses: Patent. Anatomic variants: Dominant appearance of the distal right vertebral artery, the left is diminutive beyond the left PICA origin. Fetal type right PCA origin. Delayed phase: Posterior fossa gray-white matter differentiation remains normal. Stable gray-white matter differentiation elsewhere. No acute infarct is evident by C No abnormal enhancement identified. Review of the MIP images confirms the above findings IMPRESSION: 1. The right vertebral artery is abnormal throughout its course. 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 2. Unfortunately the distal right vertebral artery is dominant. The left vertebral is normal but diminutive beyond the left PICA. Still, the vertebrobasilar junction, basilar artery, and other posterior circulation remain patent. 3. Stable CT appearance of the brain since 1156 hours today with no acute infarct evident. 4. Minimal carotid atherosclerosis, no carotid stenosis, and otherwise negative anterior circulation. Electronically Signed   By: Odessa Fleming M.D.   On: 02/19/2017 13:51   Ct Head Wo Contrast  Result Date: 02/19/2017 CLINICAL DATA:  Worst headache of life with nausea and vomiting.  Prior stroke. EXAM: CT HEAD WITHOUT CONTRAST TECHNIQUE: Contiguous axial images were obtained from the base of the skull through the vertex without intravenous contrast. COMPARISON:  Head CT and MRI 12/29/2015 FINDINGS: Brain: There is a chronic infarct in the posterior limb of the right internal capsule extending to the posterior lentiform nucleus, acute on the prior MRI. There is no evidence of acute infarct, intracranial hemorrhage, mass, midline shift, or extra-axial fluid collection. The ventricles and sulci are normal. A partially empty sella is again noted. Vascular: No hyperdense vessel. Skull: No fracture or focal osseous lesion. Sinuses/Orbits:  Visualized paranasal sinuses and mastoid air cells are clear. Orbits are unremarkable. Other: None. IMPRESSION: 1. No evidence of acute intracranial abnormality. 2. 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 Electronically Signed   By: Sebastian Ache M.D.   On: 02/19/2017 12:10   Ct Angio Neck W And/or Wo Contrast  Result Date: 02/19/2017 CLINICAL DATA:  52 year old male with severe posterior neck pain which is non radiating. Nausea and vomiting beginning abruptly today. EXAM: CT ANGIOGRAPHY HEAD AND NECK TECHNIQUE: Multidetector CT imaging of the head and neck was performed using the standard protocol during bolus administration of intravenous contrast. Multiplanar CT image reconstructions and MIPs were obtained to evaluate the vascular anatomy. Carotid stenosis measurements (when applicable) are obtained utilizing NASCET criteria, using the distal internal carotid diameter as the denominator. CONTRAST:  75mL ISOVUE-370 IOPAMIDOL (ISOVUE-370) INJECTION 76% COMPARISON:  Head CT without contrast 1156 hours today. Brain MRI 12/29/2015. FINDINGS: CTA NECK Skeleton: Preserved cervical lordosis, perhaps mildly exaggerated. Moderate to severe chronic facet hypertrophy on the right at C2-C3 with trace vacuum facet. Minimal to mild cervical spine degeneration elsewhere. No acute osseous abnormality identified. Upper chest: Negative visible lungs.  Normal superior mediastinum. Other neck: Thyroid is negative for age; there a subcentimeter hypodense nodule in the right lobe which does not meet consensus criteria for further evaluation. Negative neck soft tissues otherwise. No lymphadenopathy. Aortic arch: 3 vessel arch configuration. Minimal arch atherosclerosis. Right carotid system: Negative. Left carotid system: Mild soft and calcified atherosclerosis at the left carotid bifurcation. No associated stenosis. Otherwise negative. Vertebral arteries: Normal proximal right subclavian artery. The very  proximal right vertebral artery origin is patent, but enhancement of the vessel abruptly terminates in a triangular shaped area just beyond the origin (series 7, image 121). The remainder of the right V1 segment is not enhancing, but there is intermittent enhancement in the right V 2 segment (such as series 6, image 255 where the lumen appears irregular). The distal V2 segment is enhancing with an oblique or intraluminal filling defect (series 6, image 209). Approximately half of the vessel lumen appears to be enhancing at the C2 vertebral level. The V3 segment is poorly enhancing. The V4 segment is described below. Normal proximal left subclavian artery and left vertebral artery origin. The left vertebral artery is enhancing and appears normal to the skull base. CTA HEAD Posterior circulation: The distal left vertebral artery appears diminutive beyond the left PICA origin. The more dominant appearing distal right vertebral artery demonstrates poor enhancement in the proximal V4 segment but is moderately enhancing distally, and the vertebrobasilar junction is patent. The basilar artery is patent. SCA and PCA origins are patent. There is a fetal type right PCA origin. The left posterior communicating artery is also present. Bilateral PCA branches are within normal limits. Anterior circulation: Both ICA siphons are patent. Mild calcified plaque only on the right. No siphon stenosis. Normal ophthalmic and posterior  communicating artery origins. Patent carotid termini. Normal MCA and ACA origins. Anterior communicating artery and bilateral ACA branches are within normal limits. Bilateral MCA M1 segments, MCA bifurcations, and bilateral MCA branches are within normal limits. Venous sinuses: Patent. Anatomic variants: Dominant appearance of the distal right vertebral artery, the left is diminutive beyond the left PICA origin. Fetal type right PCA origin. Delayed phase: Posterior fossa gray-white matter differentiation  remains normal. Stable gray-white matter differentiation elsewhere. No acute infarct is evident by C No abnormal enhancement identified. Review of the MIP images confirms the above findings IMPRESSION: 1. The right vertebral artery is abnormal throughout its course. 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 2. Unfortunately the distal right vertebral artery is dominant. The left vertebral is normal but diminutive beyond the left PICA. Still, the vertebrobasilar junction, basilar artery, and other posterior circulation remain patent. 3. Stable CT appearance of the brain since 1156 hours today with no acute infarct evident. 4. Minimal carotid atherosclerosis, no carotid stenosis, and otherwise negative anterior circulation. Electronically Signed   By: Odessa FlemingH  Hall M.D.   On: 02/19/2017 13:51   Mr Brain Wo Contrast  Result Date: 02/19/2017 CLINICAL DATA:  Stroke follow-up.  Nausea and vomiting. EXAM: MRI HEAD WITHOUT CONTRAST TECHNIQUE: Multiplanar, multiecho pulse sequences of the brain and surrounding structures were obtained without intravenous contrast. COMPARISON:  12/29/2015 FINDINGS: Brain: Large acute infarct in the inferior right cerebellum, the pica territory. No superimposed hemorrhage. Remote perforator infarct affecting the right posterior corona radiata. No hydrocephalus, shift, or masslike finding. Partially empty sella, incidental. Vascular: Abnormal right vertebral flow void, known from preceding CTA where there was changes of dissection. Skull and upper cervical spine: Negative for marrow lesion Sinuses/Orbits: Negative Other: These results were communicated to Dr Thad Rangereynolds at 3:41 pmon 1/29/2019by text page via the [facility] messaging system. IMPRESSION: 1. 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 2. No hydrocephalus. 3. Small remote right corona radiata infarct.  Electronically Signed   By: Marnee SpringJonathon  Watts M.D.   On: 02/19/2017 15:51    EKG:\ not done  Time spent: 70 minutes Raymon MuttonMarina [place] [facility] Pager [phone]  If 7PM-7AM, please contact night-coverage [email or web address] Password [email or web address] 02/20/2017, 7:56 AM

## 2017-02-20 NOTE — H&P (Addendum)
Chief Complaint: Vomiting  History obtained from: Patient and Chart    HPI:                                                                                                                                       Kenneth [name] is an 52 y.o. male with PMH of HTN, DM presents naseua, vomiting, dizziness to [facility]. His symptoms began at 9.30 when walking to his car and developed sudden onset nausea and vomiting and gait imablance. He was taken to [facility] regional and evaluated by Dr Jodi Mourning, a neurologist. NIHSS was 1. He did not get TPA.  CT head showed no hemorrhage.  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 He has been complaining on and off neck pain x 1 week. Denies any recent trauma to neck, chiropractic manipulation, lift heavy weights.  He was transferred to [facility] cone for diagnostic cerebral angiogram. 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 Still smokes.   On arrival patient is stable, complaining of headache.     Date last known well: 1.29.19 Time last known well: 9.30 am     Past Medical History:  Diagnosis Date  . Coronary artery disease   . Diabetes mellitus without complication (HCC)   . Hypertension     Past Surgical History:  Procedure Laterality Date  . CORONARY ANGIOPLASTY WITH STENT PLACEMENT  06/18/2008  . CORONARY ANGIOPLASTY WITH STENT PLACEMENT      Family History  Problem Relation Age of Onset  . Heart attack Mother   . Heart failure Mother   . Coronary artery disease Father   . Diabetes Father    Social History:  reports that he has been smoking cigarettes.  He has been smoking about 2.00 packs per day. he has never used smokeless tobacco. He reports that he does not drink alcohol or use drugs.  Allergies: No Known Allergies  Medications:                                                                                                                        I reviewed  home medications,   ROS:  14 systems reviewed and negative except above.   Examination:                                                                                                      General: Appears well-developed and well-nourished.  Psych: Affect appropriate to situation Eyes: No scleral injection HENT: No OP obstrucion Head: Normocephalic.  Cardiovascular: Normal rate and regular rhythm.  Respiratory: Effort normal and breath sounds normal to anterior ascultation GI: Soft.  No distension. There is no tenderness.  Skin: WDI   Neurological Examination Mental Status: Alert, oriented, thought content appropriate.  Speech fluent without evidence of aphasia. Able to follow 3 step commands without difficulty. Cranial Nerves: II: Visual fields grossly normal,  III,IV, VI: ptosis not present, extra-ocular motions intact bilaterally, pupils equal, round, reactive to light and accommodation V,VII: smile symmetric, facial light touch sensation normal bilaterally VIII: hearing normal bilaterally IX,X: uvula rises symmetrically XI: bilateral shoulder shrug XII: midline tongue extension Motor: Right : Upper extremity   5/5    Left:     Upper extremity   5/5  Lower extremity   5/5     Lower extremity   5/5 Tone and bulk:normal tone throughout; no atrophy noted Sensory: Pinprick and light touch intact throughout, bilaterally Deep Tendon Reflexes: 2+ and symmetric throughout Plantars: Right: downgoing   Left: downgoing Cerebellar: normal finger-to-nose, normal rapid alternating movements and normal heel-to-shin test Gait: normal gait and station     Lab Results: Basic Metabolic Panel: Recent Labs  Lab 02/19/17 1118  NA 134*  K 3.7  CL 102  CO2 22  GLUCOSE 199*  BUN 17  CREATININE 1.06  CALCIUM 9.1  MG 1.9    CBC: Recent Labs  Lab  02/19/17 1118  WBC 10.1  NEUTROABS 6.1  HGB 17.8  HCT 52.6*  MCV 85.9  PLT 310    Coagulation Studies: No results for input(s): LABPROT, INR in the last 72 hours.  Imaging: Ct Angio Head W Or Wo Contrast  Result Date: 02/19/2017 CLINICAL DATA:  52 year old male with severe posterior neck pain which is non radiating. Nausea and vomiting beginning abruptly today. EXAM: CT ANGIOGRAPHY HEAD AND NECK TECHNIQUE: Multidetector CT imaging of the head and neck was performed using the standard protocol during bolus administration of intravenous contrast. Multiplanar CT image reconstructions and MIPs were obtained to evaluate the vascular anatomy. Carotid stenosis measurements (when applicable) are obtained utilizing NASCET criteria, using the distal internal carotid diameter as the denominator. CONTRAST:  75mL ISOVUE-370 IOPAMIDOL (ISOVUE-370) INJECTION 76% COMPARISON:  Head CT without contrast 1156 hours today. Brain MRI 12/29/2015. FINDINGS: CTA NECK Skeleton: Preserved cervical lordosis, perhaps mildly exaggerated. Moderate to severe chronic facet hypertrophy on the right at C2-C3 with trace vacuum facet. Minimal to mild cervical spine degeneration elsewhere. No acute osseous abnormality identified. Upper chest: Negative visible lungs.  Normal superior mediastinum. Other neck: Thyroid is negative for age; there a subcentimeter hypodense nodule in the right lobe which does not meet consensus criteria for further evaluation. Negative neck soft tissues otherwise. No lymphadenopathy. Aortic arch: 3  vessel arch configuration. Minimal arch atherosclerosis. Right carotid system: Negative. Left carotid system: Mild soft and calcified atherosclerosis at the left carotid bifurcation. No associated stenosis. Otherwise negative. Vertebral arteries: Normal proximal right subclavian artery. The very proximal right vertebral artery origin is patent, but enhancement of the vessel abruptly terminates in a triangular shaped  area just beyond the origin (series 7, image 121). The remainder of the right V1 segment is not enhancing, but there is intermittent enhancement in the right V 2 segment (such as series 6, image 255 where the lumen appears irregular). The distal V2 segment is enhancing with an oblique or intraluminal filling defect (series 6, image 209). Approximately half of the vessel lumen appears to be enhancing at the C2 vertebral level. The V3 segment is poorly enhancing. The V4 segment is described below. Normal proximal left subclavian artery and left vertebral artery origin. The left vertebral artery is enhancing and appears normal to the skull base. CTA HEAD Posterior circulation: The distal left vertebral artery appears diminutive beyond the left PICA origin. The more dominant appearing distal right vertebral artery demonstrates poor enhancement in the proximal V4 segment but is moderately enhancing distally, and the vertebrobasilar junction is patent. The basilar artery is patent. SCA and PCA origins are patent. There is a fetal type right PCA origin. The left posterior communicating artery is also present. Bilateral PCA branches are within normal limits. Anterior circulation: Both ICA siphons are patent. Mild calcified plaque only on the right. No siphon stenosis. Normal ophthalmic and posterior communicating artery origins. Patent carotid termini. Normal MCA and ACA origins. Anterior communicating artery and bilateral ACA branches are within normal limits. Bilateral MCA M1 segments, MCA bifurcations, and bilateral MCA branches are within normal limits. Venous sinuses: Patent. Anatomic variants: Dominant appearance of the distal right vertebral artery, the left is diminutive beyond the left PICA origin. Fetal type right PCA origin. Delayed phase: Posterior fossa gray-white matter differentiation remains normal. Stable gray-white matter differentiation elsewhere. No acute infarct is evident by C No abnormal enhancement  identified. Review of the MIP images confirms the above findings IMPRESSION: 1. The right vertebral artery is abnormal throughout its course. 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 2. Unfortunately the distal right vertebral artery is dominant. The left vertebral is normal but diminutive beyond the left PICA. Still, the vertebrobasilar junction, basilar artery, and other posterior circulation remain patent. 3. Stable CT appearance of the brain since 1156 hours today with no acute infarct evident. 4. Minimal carotid atherosclerosis, no carotid stenosis, and otherwise negative anterior circulation. Electronically Signed   By: Odessa Fleming M.D.   On: 02/19/2017 13:51   Ct Head Wo Contrast  Result Date: 02/19/2017 CLINICAL DATA:  Worst headache of life with nausea and vomiting. Prior stroke. EXAM: CT HEAD WITHOUT CONTRAST TECHNIQUE: Contiguous axial images were obtained from the base of the skull through the vertex without intravenous contrast. COMPARISON:  Head CT and MRI 12/29/2015 FINDINGS: Brain: There is a chronic infarct in the posterior limb of the right internal capsule extending to the posterior lentiform nucleus, acute on the prior MRI. There is no evidence of acute infarct, intracranial hemorrhage, mass, midline shift, or extra-axial fluid collection. The ventricles and sulci are normal. A partially empty sella is again noted. Vascular: No hyperdense vessel. Skull: No fracture or focal osseous lesion. Sinuses/Orbits: Visualized paranasal sinuses and mastoid air cells are clear. Orbits are unremarkable. Other: None. IMPRESSION:  1. No evidence of acute intracranial abnormality. 2. 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 Electronically Signed   By: Sebastian AcheAllen  Grady M.D.   On: 02/19/2017 12:10   Ct Angio Neck W And/or Wo Contrast  Result Date: 02/19/2017 CLINICAL DATA:  52 year old male with severe  posterior neck pain which is non radiating. Nausea and vomiting beginning abruptly today. EXAM: CT ANGIOGRAPHY HEAD AND NECK TECHNIQUE: Multidetector CT imaging of the head and neck was performed using the standard protocol during bolus administration of intravenous contrast. Multiplanar CT image reconstructions and MIPs were obtained to evaluate the vascular anatomy. Carotid stenosis measurements (when applicable) are obtained utilizing NASCET criteria, using the distal internal carotid diameter as the denominator. CONTRAST:  75mL ISOVUE-370 IOPAMIDOL (ISOVUE-370) INJECTION 76% COMPARISON:  Head CT without contrast 1156 hours today. Brain MRI 12/29/2015. FINDINGS: CTA NECK Skeleton: Preserved cervical lordosis, perhaps mildly exaggerated. Moderate to severe chronic facet hypertrophy on the right at C2-C3 with trace vacuum facet. Minimal to mild cervical spine degeneration elsewhere. No acute osseous abnormality identified. Upper chest: Negative visible lungs.  Normal superior mediastinum. Other neck: Thyroid is negative for age; there a subcentimeter hypodense nodule in the right lobe which does not meet consensus criteria for further evaluation. Negative neck soft tissues otherwise. No lymphadenopathy. Aortic arch: 3 vessel arch configuration. Minimal arch atherosclerosis. Right carotid system: Negative. Left carotid system: Mild soft and calcified atherosclerosis at the left carotid bifurcation. No associated stenosis. Otherwise negative. Vertebral arteries: Normal proximal right subclavian artery. The very proximal right vertebral artery origin is patent, but enhancement of the vessel abruptly terminates in a triangular shaped area just beyond the origin (series 7, image 121). The remainder of the right V1 segment is not enhancing, but there is intermittent enhancement in the right V 2 segment (such as series 6, image 255 where the lumen appears irregular). The distal V2 segment is enhancing with an oblique or  intraluminal filling defect (series 6, image 209). Approximately half of the vessel lumen appears to be enhancing at the C2 vertebral level. The V3 segment is poorly enhancing. The V4 segment is described below. Normal proximal left subclavian artery and left vertebral artery origin. The left vertebral artery is enhancing and appears normal to the skull base. CTA HEAD Posterior circulation: The distal left vertebral artery appears diminutive beyond the left PICA origin. The more dominant appearing distal right vertebral artery demonstrates poor enhancement in the proximal V4 segment but is moderately enhancing distally, and the vertebrobasilar junction is patent. The basilar artery is patent. SCA and PCA origins are patent. There is a fetal type right PCA origin. The left posterior communicating artery is also present. Bilateral PCA branches are within normal limits. Anterior circulation: Both ICA siphons are patent. Mild calcified plaque only on the right. No siphon stenosis. Normal ophthalmic and posterior communicating artery origins. Patent carotid termini. Normal MCA and ACA origins. Anterior communicating artery and bilateral ACA branches are within normal limits. Bilateral MCA M1 segments, MCA bifurcations, and bilateral MCA branches are within normal limits. Venous sinuses: Patent. Anatomic variants: Dominant appearance of the distal right vertebral artery, the left is diminutive beyond the left PICA origin. Fetal type right PCA origin. Delayed phase: Posterior fossa gray-white matter differentiation remains normal. Stable gray-white matter differentiation elsewhere. No acute infarct is evident by C No abnormal enhancement identified. Review of the MIP images confirms the above findings IMPRESSION: 1. The right vertebral artery is abnormal throughout its course. 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 2. Unfortunately  the distal right vertebral artery is dominant. The left vertebral is normal but diminutive beyond the left PICA. Still, the vertebrobasilar junction, basilar artery, and other posterior circulation remain patent. 3. Stable CT appearance of the brain since 1156 hours today with no acute infarct evident. 4. Minimal carotid atherosclerosis, no carotid stenosis, and otherwise negative anterior circulation. Electronically Signed   By: Odessa Fleming M.D.   On: 02/19/2017 13:51   Mr Brain Wo Contrast  Result Date: 02/19/2017 CLINICAL DATA:  Stroke follow-up.  Nausea and vomiting. EXAM: MRI HEAD WITHOUT CONTRAST TECHNIQUE: Multiplanar, multiecho pulse sequences of the brain and surrounding structures were obtained without intravenous contrast. COMPARISON:  12/29/2015 FINDINGS: Brain: Large acute infarct in the inferior right cerebellum, the pica territory. No superimposed hemorrhage. Remote perforator infarct affecting the right posterior corona radiata. No hydrocephalus, shift, or masslike finding. Partially empty sella, incidental. Vascular: Abnormal right vertebral flow void, known from preceding CTA where there was changes of dissection. Skull and upper cervical spine: Negative for marrow lesion Sinuses/Orbits: Negative Other: These results were communicated to Dr Thad Ranger at 3:41 pmon 1/29/2019by text page via the [facility] messaging system. IMPRESSION: 1. 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 2. No hydrocephalus. 3. Small remote right corona radiata infarct. Electronically Signed   By: Marnee Spring M.D.   On: 02/19/2017 15:51     ASSESSMENT AND PLAN   52 y.o. male presenting with intractable nausea and vomiting and gait imbalance. Neck pain x 1 week. Marland Kitchen  Head CT reviewed and shows no acute changes.  CTA of the head and neck shows what appears to be a right vertebral dissection.  Patient noncompliant with home medications. MRI brain shows moderate size right cerebellar  stroke. H   Right PICA acute infarction  Risk factors: HTN, DM, CHF  Etiology: dissection vs athero vs cardioembolic  Recommend #MRA neck with fat suppression sequence to evaluate for dissection, discussed with stroke team and no need for 4 vessel angiogram  #Transthoracic Echo  # ASA 325mg  daily and Plavix 75mg  daily  # Atorvastatin 40 mg # BP goal: permissive HTN upto 220 systolic, PRNs above 220 # HBAIC and Lipid profile # Telemetry monitoring # Frequent neuro checks  Risk for cerebral edema  Moderate size cerebellar stroke, low risk of herniation and hydrocephalus,  fourth ventricle currently open NA only 134, will start salt tabs Repeat CT tomorrow   HTN Permissive HTN goal upto 220 systolic Gradual reduction to normotension over next few days  DM SS insulin ordered  HBAIC pending   Please page stroke NP  Or  PA  Or MD from 8am -4 pm  as this patient from this time will be  followed by the stroke.   You can look them up on [email or web address]  Password [facility]       Yukie Bergeron [facility] Pager Number [phone]

## 2017-02-20 NOTE — Evaluation (Signed)
Speech Language Pathology Evaluation Patient Details Name: Kenneth CluckJimmy R [name] MRN: [id number] DOB: 11/18/1965 Today's Date: 02/20/2017 Time: [id number] SLP Time Calculation (min) (ACUTE ONLY): 19 min  Problem List:  Patient Active Problem List   Diagnosis Date Noted  . Ischemic stroke (HCC) 02/20/2017  . CVA (cerebral vascular accident) (HCC) 12/30/2015  . TIA (transient ischemic attack) 12/29/2015  . Hypertension 06/30/2015  . Arteriosclerosis of coronary artery 07/28/2014  . Type 2 diabetes mellitus with hyperglycemia (HCC) 07/28/2014  . Esophageal reflux 07/28/2014  . HLD (hyperlipidemia) 09/10/2006   Past Medical History:  Past Medical History:  Diagnosis Date  . Coronary artery disease   . Diabetes mellitus without complication (HCC)   . Hypertension    Past Surgical History:  Past Surgical History:  Procedure Laterality Date  . CORONARY ANGIOPLASTY WITH STENT PLACEMENT  06/18/2008  . CORONARY ANGIOPLASTY WITH STENT PLACEMENT     HPI:  Pt is a 52 y.o.malepresenting with intractable nausea and vomitingand gait imbalance with neck pain x 1 week. 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 PMH includes HTN, DM, CAD.   Assessment / Plan / Recommendation Clinical Impression  Pt demonstrates higher level cognitive impairments in the areas of alternating attention, recall of new information, awareness, and complex problem solving as evidenced by 2/10 score on the cerebellar cognitive affective/Schmahmann syndrome scale (CCAS-scale). He is impulsive throughout testing and becomes restless, frustrated as testing continues. 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    SLP Assessment  SLP Recommendation/Assessment: Patient needs continued Speech Lanaguage Pathology Services SLP Visit Diagnosis: Cognitive communication deficit (R41.841)    Follow Up  Recommendations  (tbd pending PT/OT evals)    Frequency and Duration min 2x/week  2 weeks      SLP Evaluation Cognition  Overall Cognitive Status: Impaired/Different from baseline Arousal/Alertness: Awake/alert Orientation Level: Oriented X4 Attention: Alternating Alternating Attention: Impaired Alternating Attention Impairment: Verbal basic Memory: Impaired Memory Impairment: Decreased recall of new information Awareness: Impaired Awareness Impairment: Emergent impairment;Intellectual impairment;Anticipatory impairment Problem Solving: Impaired Problem Solving Impairment: Verbal complex Behaviors: Impulsive Safety/Judgment: Impaired       Comprehension  Auditory Comprehension Overall Auditory Comprehension: Appears within functional limits for tasks assessed    Expression Expression Primary Mode of Expression: Verbal Verbal Expression Overall Verbal Expression: Impaired Initiation: No impairment Level of Generative/Spontaneous Verbalization: Conversation Naming: Impairment Divergent: 25-49% accurate Non-Verbal Means of Communication: Not applicable   Oral / Motor  Oral Motor/Sensory Function Overall Oral Motor/Sensory Function: Within functional limits Motor Speech Overall Motor Speech: Appears within functional limits for tasks assessed   GO                    Maxcine Hamaiewonsky, Shakirah Kirkey 02/20/2017, 12:01 PM  Maxcine HamLaura Paiewonsky, M.A. CCC-SLP [phone]

## 2017-02-20 NOTE — H&P (Signed)
XXX

## 2017-02-20 NOTE — Progress Notes (Signed)
  Echocardiogram 2D Echocardiogram has been performed.  Kenneth [name] 02/20/2017, 3:06 PM

## 2017-02-20 NOTE — Progress Notes (Signed)
[facility]. Pt alert and oriented x4. VS stable. No complaints af N/V. Pain 2/10. Admitting MD paged. Awaiting orders. Will  continue to monitor.

## 2017-02-20 NOTE — ED Notes (Signed)
EMTALA reviewed prior to pt transfer. Appears complete at this time.

## 2017-02-21 ENCOUNTER — Inpatient Hospital Stay (HOSPITAL_COMMUNITY): Payer: Self-pay

## 2017-02-21 DIAGNOSIS — I7774 Dissection of vertebral artery: Secondary | ICD-10-CM

## 2017-02-21 DIAGNOSIS — I63211 Cerebral infarction due to unspecified occlusion or stenosis of right vertebral arteries: Secondary | ICD-10-CM

## 2017-02-21 LAB — SODIUM
SODIUM: 139 mmol/L (ref 135–145)
SODIUM: 135 mmol/L (ref 135–145)
Sodium: 140 mmol/L (ref 135–145)

## 2017-02-21 LAB — CBC WITH DIFFERENTIAL/PLATELET
BASOS ABS: 0 10*3/uL (ref 0.0–0.1)
Basophils Relative: 0 %
Eosinophils Absolute: 0 10*3/uL (ref 0.0–0.7)
Eosinophils Relative: 0 %
HEMATOCRIT: 46.9 % (ref 39.0–52.0)
Hemoglobin: 15.8 g/dL (ref 13.0–17.0)
LYMPHS PCT: 25 %
Lymphs Abs: 2.5 10*3/uL (ref 0.7–4.0)
MCH: 30 pg (ref 26.0–34.0)
MCHC: 33.7 g/dL (ref 30.0–36.0)
MCV: 89 fL (ref 78.0–100.0)
MONO ABS: 0.6 10*3/uL (ref 0.1–1.0)
MONOS PCT: 6 %
NEUTROS ABS: 6.9 10*3/uL (ref 1.7–7.7)
Neutrophils Relative %: 69 %
Platelets: 223 10*3/uL (ref 150–400)
RBC: 5.27 MIL/uL (ref 4.22–5.81)
RDW: 13.4 % (ref 11.5–15.5)
WBC: 10.1 10*3/uL (ref 4.0–10.5)

## 2017-02-21 LAB — LIPID PANEL
Cholesterol: 141 mg/dL (ref 0–200)
HDL: 27 mg/dL — AB (ref >40)
LDL Cholesterol: 63 mg/dL (ref 0–99)
TRIGLYCERIDES: 253 mg/dL — AB (ref <150)
Total CHOL/HDL Ratio: 5.2 RATIO
VLDL: 51 mg/dL — ABNORMAL HIGH (ref 0–40)

## 2017-02-21 LAB — BASIC METABOLIC PANEL
Anion gap: 9 (ref 5–15)
BUN: 10 mg/dL (ref 6–20)
CHLORIDE: 107 mmol/L (ref 101–111)
CO2: 23 mmol/L (ref 22–32)
Calcium: 8.5 mg/dL — ABNORMAL LOW (ref 8.9–10.3)
Creatinine, Ser: 0.93 mg/dL (ref 0.61–1.24)
GFR calc Af Amer: >60 mL/min (ref >60)
GFR calc non Af Amer: >60 mL/min (ref >60)
GLUCOSE: 145 mg/dL — AB (ref 65–99)
Potassium: 3.8 mmol/L (ref 3.5–5.1)
SODIUM: 139 mmol/L (ref 135–145)

## 2017-02-21 LAB — GLUCOSE, CAPILLARY
GLUCOSE-CAPILLARY: 169 mg/dL — AB (ref 65–99)
Glucose-Capillary: 148 mg/dL — ABNORMAL HIGH (ref 65–99)
Glucose-Capillary: 148 mg/dL — ABNORMAL HIGH (ref 65–99)
Glucose-Capillary: 148 mg/dL — ABNORMAL HIGH (ref 65–99)

## 2017-02-21 LAB — HIV ANTIBODY (ROUTINE TESTING W REFLEX): HIV Screen 4th Generation wRfx: NONREACTIVE

## 2017-02-21 MED ORDER — SODIUM CHLORIDE 3 % IV SOLN
INTRAVENOUS | Status: DC
  Administered 2017-02-22 (×2): 50 mL/h via INTRAVENOUS
  Filled 2017-02-21 (×5): qty 500

## 2017-02-21 MED ORDER — SODIUM CHLORIDE 0.9 % IV SOLN
INTRAVENOUS | Status: DC
  Administered 2017-02-21: 18:00:00 via INTRAVENOUS

## 2017-02-21 MED ORDER — METOCLOPRAMIDE HCL 5 MG/ML IJ SOLN
10.0000 mg | Freq: Once | INTRAMUSCULAR | Status: AC
  Administered 2017-02-21: 10 mg via INTRAVENOUS
  Filled 2017-02-21: qty 2

## 2017-02-21 MED ORDER — DIPHENHYDRAMINE HCL 50 MG/ML IJ SOLN
25.0000 mg | Freq: Once | INTRAMUSCULAR | Status: AC
  Administered 2017-02-21: 25 mg via INTRAVENOUS
  Filled 2017-02-21: qty 1

## 2017-02-21 MED ORDER — KETOROLAC TROMETHAMINE 30 MG/ML IJ SOLN
30.0000 mg | Freq: Once | INTRAMUSCULAR | Status: AC
  Administered 2017-02-21: 30 mg via INTRAVENOUS
  Filled 2017-02-21: qty 1

## 2017-02-21 MED ORDER — PANTOPRAZOLE SODIUM 40 MG PO TBEC
40.0000 mg | DELAYED_RELEASE_TABLET | Freq: Two times a day (BID) | ORAL | Status: DC
  Administered 2017-02-21 – 2017-02-27 (×12): 40 mg via ORAL
  Filled 2017-02-21 (×12): qty 1

## 2017-02-21 MED ORDER — ATORVASTATIN CALCIUM 80 MG PO TABS
80.0000 mg | ORAL_TABLET | Freq: Every day | ORAL | Status: DC
  Administered 2017-02-21 – 2017-02-26 (×6): 80 mg via ORAL
  Filled 2017-02-21 (×7): qty 1

## 2017-02-21 MED ORDER — ASPIRIN EC 325 MG PO TBEC
325.0000 mg | DELAYED_RELEASE_TABLET | Freq: Every day | ORAL | Status: DC
  Administered 2017-02-21 – 2017-02-27 (×7): 325 mg via ORAL
  Filled 2017-02-21 (×7): qty 1

## 2017-02-21 MED ORDER — BUTALBITAL-APAP-CAFFEINE 50-325-40 MG PO TABS
1.0000 | ORAL_TABLET | Freq: Three times a day (TID) | ORAL | Status: DC | PRN
  Administered 2017-02-21 – 2017-02-25 (×3): 1 via ORAL
  Filled 2017-02-21 (×3): qty 1

## 2017-02-21 NOTE — Progress Notes (Signed)
OT Cancellation Note  Patient Details Name: Kenneth [name] MRN: [id number] DOB: 09/30/1965   Cancelled Treatment:    Reason Eval/Treat Not Completed: Patient declined, no reason specified. Pt with 10/10 headache and politely, but firmly declined OT evaluation this afternoon. OT will continue to follow for evaluation and will re-attempt in the morning.  He did share that "my balance is crap especially when I'm getting my socks on" and that he was completely independent - driving, working [employer], no DME etc. He does go by "Ray"  Emelda FearLaura J Maribella Kuna 02/21/2017, 4:23 PM  Sherryl MangesLaura Deniqua Perry [name] [phone]

## 2017-02-21 NOTE — Progress Notes (Signed)
Discussed with Neurosurgery regarding parameters for possible surgical intervention. Per Neurosurgery if there is change in exam including drowsiness or decreased level of alertness, they should be called back to assess for possible ventriculostomy and/or suboccipital craniectomy.   Electronically signed: Dr. Caryl PinaEric Shriyan Arakawa

## 2017-02-21 NOTE — Progress Notes (Signed)
Transport came to take pt to STAT head CT. Pt refused stating that he "would not go through that while my head hurts this bad." Gave pt 1 PO tablet fioricet PRN and 4mg  IV zofran. Willl call CT to rearrange transport within the hour once pain is controlled.

## 2017-02-21 NOTE — Progress Notes (Signed)
PROGRESS NOTE    Kenneth [name]  ZOX:[id number] DOB: 09-01-1965 DOA: 02/20/2017 PCP: Anola Gurney, PA   Brief Narrative:  Kenneth [name] is a 52 y.o. male with a Past Medical History of DM, HTN, CAD, and CVA who presents with acute CVA diagnosed at [facility].  He was transferred from [facility] to [facility] overnight on the Neurology service for diagnostic cerebral angiogram. TRH was consulted and assumed care. He was found to have an acute CVA and a Vertebral Artery Dissection. Workup is currently underway and Neurology is following. A head CT w/o Contrast is going to be repeated in AM and patient to under go TEE for ? Right Apical Thrombus.   Assessment & Plan:   Active Problems:   Ischemic stroke (HCC)   Type 2 diabetes mellitus with hyperlipidemia (HCC)   Uncontrolled type 2 diabetes mellitus with hyperglycemia (HCC)   Tobacco dependence  Acute CVA -Transferred patient to the [facility] service for ongoing care with assistance from neurology/stroke team -C/wTelemetry monitoring, currently in SDU and patient can transfer to tele when approved by Neuro -Further imaging as per neuro and they are repeating a Head CT in AM -Initial Head CT w/o Contrast showed No evidence of acute intracranial abnormality.Chronic posterior right internal capsule/basal ganglia infarct. -CT Angio Head and Neck showed The right vertebral artery is abnormal throughout its course. 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 Unfortunately the distal right vertebral artery is dominant. The Left vertebral is normal but diminutive beyond the left PICA. Still the vertebrobasilar junction, basilar artery, and other posterior circulation remain patent. Stable CT appearance of the brain since 1156 hours today with no acute infarct evident. . Minimal carotid atherosclerosis, no carotid stenosis, and otherwise negative anterior circulation. -MRI  Brain showed Acute right pica territory infarct with known underlying right vertebral dissection. No hydrocephalus. Small remote right corona radiata infarct. -MRA Neck showed Abnormal MRA appearance of the right vertebral artery compatible with proximal vertebral artery dissection and thrombosis. The vessel appears to be reconstituted in the distal V2 segment. Negative MRI appearance of the left vertebral artery and bilateral cervical carotid arteries. -ECHOCardiogram showed EF of 35-40% with Grade 2 DD and ? Apical Thrombus -Will obtain TEE -Risk stratification with FLP, A1c (7.7 on 1/29); will also check TSH and UDS - although this is late to order -Lipid Panel as below -UDS Positive for Barbiturates, Benzodiazepines, Opiates,  -TSH was 0.765 -C/w ASA and Plavix daily for DAPT at least 3 months.  Of note, patient was not taking ASA regularly and so this does not constitute primary ASA failure.  He did start ASA just prior and this can reportedly precipitate events. Regardless, will request CM assistance for Plavix but generic Clopidogrel is available from [facility] for $9 for 30 days and $24 for 90 days. -Neurochecks per Protocol  -PT/OT/SLP/Nutrition Consults  Right Vertebral Artery Dissection -As Above -C/w ASA 325 mg po Daily, Plavix 75 mg po Daily, and Atorvastatin 80 mg po qHS -Neuro recommending repeat Head CT in AM -C/w Telemetry Monitoring -Neurology started patient on IVF with NS at 75 mL/hr to try to prevent Cerebral Edema   HTN -Allow permissive HTN -Treat BP only if >220/120, and then with goal of 15% reduction -Patient has not been taking any medications for BP.   -Lisinopril and Metoprolol are both available on the $4 [facility] list and would be excellent options, but there are a multitude  of other medications also available on the list at [facility]. -Continue To Monitor closely   HLD -Lipid Panel showed Cholesterol of 141, HDL of 27, LDL of 63, TG of 253, and VLDL  51 -Patient now on Atorvastatin 80 mg po Daily  -Review of Home Medication list shows patient is supposed to be on Gemfibrozil 600 mg po BID  -Generic Atorvastatin 40 mg is available on the [facility] list for $9 for 30 tablets or $24 for 90 tablets and this would clearly be his best option but will need to check 80 mg cost  Diabetes Mellitus Type 2  -Patient's A1c is 7.7 indicating at least some compliance with medications and/or dietary strategies (at least compared to prior).  -He was previously prescribed Glucophage 1000 mg BID, and this medication is available for $4/month or $10/3 months at [facility]. -Review of Home Medication list also shows patient was supposed to be on Glimepiride 4 mg po daily but not taking -C/w Senistive Novolog SSI AC -CBG's ranging from 148-164  Tobacco Abuse/Dependence -Smoking Cessation Counseling given. This was discussed with the patient and should be reviewed on an ongoing basis.   -Ordered Nicotine 14 mg TD Patch .  GERD  -Ordered Protonix IV BID on admission and will change to po Protonix 40 mg BID today  -Omeprazole 20 or 40 mg is available on the [facility] list for $9 for 30 capsules or $24 for 90 capsules.  CAD -Hx of Stenting -ASA 325 mg po Daily, Clopidogrel 75 mg po daily, and C/w Morphine 2 mg q2hprn -No BB or ACE currently to allow for Permissive HTN -ECHOCardiogram done and showed EF of 35-40% and Grade 2 DD with ? Apical Thrombus   DVT prophylaxis: Heparin 5,000 units sq q8h Code Status: FULL CODE Family Communication: Discussed with Wife at Bedside Disposition Plan: D/C Home with Home Health PT/OT  Consultants:   Neurology Stroke Team   Procedures: ECHOCARDIOGRAM ------------------------------------------------------------------- Study Conclusions  - Left ventricle: The cavity size was normal. Systolic function was   moderately reduced. The estimated ejection fraction was in the   range of 35% to 40%. Images were inadequate for  LV wall motion   assessment and cannot visualize the apex adequately. Features are   consistent with a pseudonormal left ventricular filling pattern,   with concomitant abnormal relaxation and increased filling   pressure (grade 2 diastolic dysfunction). Doppler parameters are   consistent with high ventricular filling pressure. - Aortic valve: Trileaflet; normal thickness, mildly calcified   leaflets. - Mitral valve: There was trivial regurgitation. - Left atrium: The atrium was mildly dilated. - Pulmonary arteries: PA peak pressure: 34 mm Hg (S). - Pericardium, extracardiac: A trivial pericardial effusion was   identified posterior to the heart. - Recommendations: Limited study with definity contrast to assess   wall motion and rule out apical thrombus.  Recommendations:  Limited study with definity contrast to assess wall motion and rule out apical thrombus.   Antimicrobials:  Anti-infectives (From admission, onward)   None     Subjective: Patient was seen and examined and was withdrawn and wanted to know when he could be discharged. No CP or SOB but complained of a Headache still. No nausea or vomiting. No other complaints or concerns.   Objective: Vitals:   02/21/17 0100 02/21/17 0321 02/21/17 0400 02/21/17 0700  BP: (!) 169/105  (!) 179/102   Pulse: 69  84   Resp: 11  17   Temp:  98.5 F (36.9 C)  98.7 F (37.1 C)  TempSrc:  Oral  Oral  SpO2:    98%  Weight:      Height:        Intake/Output Summary (Last 24 hours) at 02/21/2017 0848 Last data filed at 02/21/2017 0427 Gross per 24 hour  Intake -  Output 925 ml  Net -925 ml   Filed Weights   02/20/17 0501  Weight: 76.4 kg (168 lb 6.9 oz)   Examination: Physical Exam:  Constitutional: WN/WD, NAD and appears agitated and uncomfortable Eyes: Lids and conjunctivae normal, sclerae anicteric  ENMT: External Ears, Nose appear normal. Grossly normal hearing. Mucous membranes are moist.  Neck: Appears normal,  supple, no cervical masses, normal ROM, no appreciable thyromegaly, no JVD Respiratory: Diminishedto auscultation bilaterally, no wheezing, rales, rhonchi or crackles. Normal respiratory effort and patient is not tachypenic. No accessory muscle use.  Cardiovascular: RRR, no murmurs / rubs / gallops. S1 and S2 auscultated. No extremity edema.  Abdomen: Soft, non-tender, non-distended. No masses palpated. No appreciable hepatosplenomegaly. Bowel sounds positive x4.  GU: Deferred. Musculoskeletal: No clubbing / cyanosis of digits/nails. No joint deformity upper and lower extremities.   Skin: No rashes, lesions, ulcers on a limited skin evaluation. No induration; Warm and dry.  Neurologic: CN 2-12 grossly intact with no focal deficits. Romberg sign cerebellar reflexes not assessed.  Psychiatric: Normal judgment and insight. Alert and oriented x 3. Agitated mood and appropriate affect.   Data Reviewed: I have personally reviewed following labs and imaging studies  CBC: Recent Labs  Lab 02/19/17 1118 02/20/17 0841  WBC 10.1 21.2*  NEUTROABS 6.1  --   HGB 17.8 17.0  HCT 52.6* 48.4  MCV 85.9 86.3  PLT 310 271   Basic Metabolic Panel: Recent Labs  Lab 02/19/17 1118 02/20/17 0841 02/20/17 1527 02/20/17 2154 02/21/17 0340  NA 134* 139 140 139 139  K 3.7  --   --   --  3.8  CL 102  --   --   --  107  CO2 22  --   --   --  23  GLUCOSE 199*  --   --   --  145*  BUN 17  --   --   --  10  CREATININE 1.06 0.97  --   --  0.93  CALCIUM 9.1  --   --   --  8.5*  MG 1.9  --   --   --   --    GFR: Estimated Creatinine Clearance: 95.9 mL/min (by C-G formula based on SCr of 0.93 mg/dL). Liver Function Tests: Recent Labs  Lab 02/19/17 1118  AST 21  ALT 12*  ALKPHOS 80  BILITOT 0.7  PROT 7.5  ALBUMIN 4.1   Recent Labs  Lab 02/19/17 1118  LIPASE 50   No results for input(s): AMMONIA in the last 168 hours. Coagulation Profile: No results for input(s): INR, PROTIME in the last 168  hours. Cardiac Enzymes: Recent Labs  Lab 02/19/17 1118 02/19/17 1957  TROPONINI 0.04* 0.05*   BNP (last 3 results) No results for input(s): PROBNP in the last 8760 hours. HbA1C: Recent Labs    02/19/17 1118  HGBA1C 7.7*   CBG: Recent Labs  Lab 02/19/17 1205 02/20/17 1620 02/20/17 1937 02/21/17 0738  GLUCAP 197* 168* 154* 148*   Lipid Profile: Recent Labs    02/21/17 0340  CHOL 141  HDL 27*  LDLCALC 63  TRIG 960*  CHOLHDL 5.2   Thyroid Function Tests: Recent  Labs    02/20/17 1527  TSH 0.765   Anemia Panel: No results for input(s): VITAMINB12, FOLATE, FERRITIN, TIBC, IRON, RETICCTPCT in the last 72 hours. Sepsis Labs: Recent Labs  Lab 02/19/17 0059 02/19/17 1118 02/19/17 1957  LATICACIDVEN 1.7 2.7* 0.9    Recent Results (from the past 240 hour(s))  MRSA PCR Screening     Status: None   Collection Time: 02/20/17  5:30 AM  Result Value Ref Range Status   MRSA by PCR NEGATIVE NEGATIVE Final    Comment:        The GeneXpert MRSA Assay (FDA approved for NASAL specimens only), is one component of a comprehensive MRSA colonization surveillance program. It is not intended to diagnose MRSA infection nor to guide or monitor treatment for MRSA infections.          Radiology Studies: Ct Angio Head W Or Wo Contrast  Result Date: 02/19/2017 CLINICAL DATA:  52 year old male with severe posterior neck pain which is non radiating. Nausea and vomiting beginning abruptly today. EXAM: CT ANGIOGRAPHY HEAD AND NECK TECHNIQUE: Multidetector CT imaging of the head and neck was performed using the standard protocol during bolus administration of intravenous contrast. Multiplanar CT image reconstructions and MIPs were obtained to evaluate the vascular anatomy. Carotid stenosis measurements (when applicable) are obtained utilizing NASCET criteria, using the distal internal carotid diameter as the denominator. CONTRAST:  75mL ISOVUE-370 IOPAMIDOL (ISOVUE-370) INJECTION  76% COMPARISON:  Head CT without contrast 1156 hours today. Brain MRI 12/29/2015. FINDINGS: CTA NECK Skeleton: Preserved cervical lordosis, perhaps mildly exaggerated. Moderate to severe chronic facet hypertrophy on the right at C2-C3 with trace vacuum facet. Minimal to mild cervical spine degeneration elsewhere. No acute osseous abnormality identified. Upper chest: Negative visible lungs.  Normal superior mediastinum. Other neck: Thyroid is negative for age; there a subcentimeter hypodense nodule in the right lobe which does not meet consensus criteria for further evaluation. Negative neck soft tissues otherwise. No lymphadenopathy. Aortic arch: 3 vessel arch configuration. Minimal arch atherosclerosis. Right carotid system: Negative. Left carotid system: Mild soft and calcified atherosclerosis at the left carotid bifurcation. No associated stenosis. Otherwise negative. Vertebral arteries: Normal proximal right subclavian artery. The very proximal right vertebral artery origin is patent, but enhancement of the vessel abruptly terminates in a triangular shaped area just beyond the origin (series 7, image 121). The remainder of the right V1 segment is not enhancing, but there is intermittent enhancement in the right V 2 segment (such as series 6, image 255 where the lumen appears irregular). The distal V2 segment is enhancing with an oblique or intraluminal filling defect (series 6, image 209). Approximately half of the vessel lumen appears to be enhancing at the C2 vertebral level. The V3 segment is poorly enhancing. The V4 segment is described below. Normal proximal left subclavian artery and left vertebral artery origin. The left vertebral artery is enhancing and appears normal to the skull base. CTA HEAD Posterior circulation: The distal left vertebral artery appears diminutive beyond the left PICA origin. The more dominant appearing distal right vertebral artery demonstrates poor enhancement in the proximal V4  segment but is moderately enhancing distally, and the vertebrobasilar junction is patent. The basilar artery is patent. SCA and PCA origins are patent. There is a fetal type right PCA origin. The left posterior communicating artery is also present. Bilateral PCA branches are within normal limits. Anterior circulation: Both ICA siphons are patent. Mild calcified plaque only on the right. No siphon stenosis. Normal ophthalmic and posterior  communicating artery origins. Patent carotid termini. Normal MCA and ACA origins. Anterior communicating artery and bilateral ACA branches are within normal limits. Bilateral MCA M1 segments, MCA bifurcations, and bilateral MCA branches are within normal limits. Venous sinuses: Patent. Anatomic variants: Dominant appearance of the distal right vertebral artery, the left is diminutive beyond the left PICA origin. Fetal type right PCA origin. Delayed phase: Posterior fossa gray-white matter differentiation remains normal. Stable gray-white matter differentiation elsewhere. No acute infarct is evident by C No abnormal enhancement identified. Review of the MIP images confirms the above findings IMPRESSION: 1. The right vertebral artery is abnormal throughout its course. 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 2. Unfortunately the distal right vertebral artery is dominant. The left vertebral is normal but diminutive beyond the left PICA. Still, the vertebrobasilar junction, basilar artery, and other posterior circulation remain patent. 3. Stable CT appearance of the brain since 1156 hours today with no acute infarct evident. 4. Minimal carotid atherosclerosis, no carotid stenosis, and otherwise negative anterior circulation. Electronically Signed   By: Odessa Fleming M.D.   On: 02/19/2017 13:51   Dg Chest 2 View  Result Date: 02/20/2017 CLINICAL DATA:  52 year old male with history of headache.   Stroke. EXAM: CHEST  2 VIEW COMPARISON:  Chest x-ray 12/29/2015. FINDINGS: Lung volumes are normal. No consolidative airspace disease. No pleural effusions. No pneumothorax. No pulmonary nodule or mass noted. Pulmonary vasculature and the cardiomediastinal silhouette are within normal limits. IMPRESSION: No radiographic evidence of acute cardiopulmonary disease. Electronically Signed   By: Trudie Reed M.D.   On: 02/20/2017 10:23   Ct Head Wo Contrast  Result Date: 02/19/2017 CLINICAL DATA:  Worst headache of life with nausea and vomiting. Prior stroke. EXAM: CT HEAD WITHOUT CONTRAST TECHNIQUE: Contiguous axial images were obtained from the base of the skull through the vertex without intravenous contrast. COMPARISON:  Head CT and MRI 12/29/2015 FINDINGS: Brain: There is a chronic infarct in the posterior limb of the right internal capsule extending to the posterior lentiform nucleus, acute on the prior MRI. There is no evidence of acute infarct, intracranial hemorrhage, mass, midline shift, or extra-axial fluid collection. The ventricles and sulci are normal. A partially empty sella is again noted. Vascular: No hyperdense vessel. Skull: No fracture or focal osseous lesion. Sinuses/Orbits: Visualized paranasal sinuses and mastoid air cells are clear. Orbits are unremarkable. Other: None. IMPRESSION: 1. No evidence of acute intracranial abnormality. 2. 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 Electronically Signed   By: Sebastian Ache M.D.   On: 02/19/2017 12:10   Ct Angio Neck W And/or Wo Contrast  Result Date: 02/19/2017 CLINICAL DATA:  52 year old male with severe posterior neck pain which is non radiating. Nausea and vomiting beginning abruptly today. EXAM: CT ANGIOGRAPHY HEAD AND NECK TECHNIQUE: Multidetector CT imaging of the head and neck was performed using the standard protocol during bolus administration of intravenous contrast. Multiplanar CT image reconstructions and MIPs  were obtained to evaluate the vascular anatomy. Carotid stenosis measurements (when applicable) are obtained utilizing NASCET criteria, using the distal internal carotid diameter as the denominator. CONTRAST:  75mL ISOVUE-370 IOPAMIDOL (ISOVUE-370) INJECTION 76% COMPARISON:  Head CT without contrast 1156 hours today. Brain MRI 12/29/2015. FINDINGS: CTA NECK Skeleton: Preserved cervical lordosis, perhaps mildly exaggerated. Moderate to severe chronic facet hypertrophy on the right at C2-C3 with trace vacuum facet. Minimal to mild cervical spine degeneration elsewhere. No acute osseous abnormality identified.  Upper chest: Negative visible lungs.  Normal superior mediastinum. Other neck: Thyroid is negative for age; there a subcentimeter hypodense nodule in the right lobe which does not meet consensus criteria for further evaluation. Negative neck soft tissues otherwise. No lymphadenopathy. Aortic arch: 3 vessel arch configuration. Minimal arch atherosclerosis. Right carotid system: Negative. Left carotid system: Mild soft and calcified atherosclerosis at the left carotid bifurcation. No associated stenosis. Otherwise negative. Vertebral arteries: Normal proximal right subclavian artery. The very proximal right vertebral artery origin is patent, but enhancement of the vessel abruptly terminates in a triangular shaped area just beyond the origin (series 7, image 121). The remainder of the right V1 segment is not enhancing, but there is intermittent enhancement in the right V 2 segment (such as series 6, image 255 where the lumen appears irregular). The distal V2 segment is enhancing with an oblique or intraluminal filling defect (series 6, image 209). Approximately half of the vessel lumen appears to be enhancing at the C2 vertebral level. The V3 segment is poorly enhancing. The V4 segment is described below. Normal proximal left subclavian artery and left vertebral artery origin. The left vertebral artery is enhancing  and appears normal to the skull base. CTA HEAD Posterior circulation: The distal left vertebral artery appears diminutive beyond the left PICA origin. The more dominant appearing distal right vertebral artery demonstrates poor enhancement in the proximal V4 segment but is moderately enhancing distally, and the vertebrobasilar junction is patent. The basilar artery is patent. SCA and PCA origins are patent. There is a fetal type right PCA origin. The left posterior communicating artery is also present. Bilateral PCA branches are within normal limits. Anterior circulation: Both ICA siphons are patent. Mild calcified plaque only on the right. No siphon stenosis. Normal ophthalmic and posterior communicating artery origins. Patent carotid termini. Normal MCA and ACA origins. Anterior communicating artery and bilateral ACA branches are within normal limits. Bilateral MCA M1 segments, MCA bifurcations, and bilateral MCA branches are within normal limits. Venous sinuses: Patent. Anatomic variants: Dominant appearance of the distal right vertebral artery, the left is diminutive beyond the left PICA origin. Fetal type right PCA origin. Delayed phase: Posterior fossa gray-white matter differentiation remains normal. Stable gray-white matter differentiation elsewhere. No acute infarct is evident by C No abnormal enhancement identified. Review of the MIP images confirms the above findings IMPRESSION: 1. The right vertebral artery is abnormal throughout its course. 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 2. Unfortunately the distal right vertebral artery is dominant. The left vertebral is normal but diminutive beyond the left PICA. Still, the vertebrobasilar junction, basilar artery, and other posterior circulation remain patent. 3. Stable CT appearance of the brain since 1156 hours today with no acute infarct evident. 4. Minimal carotid  atherosclerosis, no carotid stenosis, and otherwise negative anterior circulation. Electronically Signed   By: Odessa FlemingH  Hall M.D.   On: 02/19/2017 13:51   Mr Maxine GlennMra Neck W Wo Contrast  Result Date: 02/20/2017 CLINICAL DATA:  52 year old male with abnormal right vertebral artery on CTA yesterday following presentation with neck pain, nausea vomiting most compatible with right vertebral artery dissection. Brain MRI yesterday revealing right PICA territory infarct. EXAM: MRA NECK WITHOUT AND WITH CONTRAST TECHNIQUE: Multiplanar and multiecho pulse sequences of the neck were obtained without and with intravenous contrast. Angiographic images of the neck were obtained using MRA technique without and with intravenous contrast. CONTRAST:  15mL MULTIHANCE GADOBENATE DIMEGLUMINE 529  MG/ML IV SOLN COMPARISON:  Brain MRI and CTA head and neck 02/20/2016. FINDINGS: Precontrast time-of-flight images reveal lost antegrade flow signal throughout the right vertebral artery in the neck and at the skull base. Preserved antegrade flow signal in the left vertebral artery and both carotid arteries throughout the neck. Precontrast axial T1 spin echo image with fat saturation demonstrates flow voids in both common carotid arteries and the left vertebral artery, but absent flow void in the proximal right vertebral artery, and asymmetric intent intrinsic T1 hyperintensity within the lumen of the distal right V2 segment in the upper neck (series 5, image 13). Post-contrast neck MRA images reveal a 3 vessel arch configuration with normal great vessel origins. There is a small triangular shaped area of abrupt termination of enhancement in the right vertebral artery origin similar to the CTA appearance yesterday (series [id number], image 35). There is no enhancement in of the right vertebral artery V1 segment, and poor irregular enhancement throughout the proximal and mid right vertebral artery V2 segment. Enhancement of the vessel appears more normal in  the distal V2 segment, while the V3 and V4 segments appear to be normally enhancing aside from mild vessel irregularity. The contralateral left vertebral artery is within normal limits. The bilateral cervical carotid arteries appear normal. IMPRESSION: 1. 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 The vessel appears to be reconstituted in the distal V2 segment. 2. Negative MRI appearance of the left vertebral artery and bilateral cervical carotid arteries. Electronically Signed   By: Odessa Fleming M.D.   On: 02/20/2017 12:54   Mr Brain Wo Contrast  Result Date: 02/19/2017 CLINICAL DATA:  Stroke follow-up.  Nausea and vomiting. EXAM: MRI HEAD WITHOUT CONTRAST TECHNIQUE: Multiplanar, multiecho pulse sequences of the brain and surrounding structures were obtained without intravenous contrast. COMPARISON:  12/29/2015 FINDINGS: Brain: Large acute infarct in the inferior right cerebellum, the pica territory. No superimposed hemorrhage. Remote perforator infarct affecting the right posterior corona radiata. No hydrocephalus, shift, or masslike finding. Partially empty sella, incidental. Vascular: Abnormal right vertebral flow void, known from preceding CTA where there was changes of dissection. Skull and upper cervical spine: Negative for marrow lesion Sinuses/Orbits: Negative Other: These results were communicated to Dr Thad Ranger at 3:41 pmon 1/29/2019by text page via the [facility] messaging system. IMPRESSION: 1. 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 2. No hydrocephalus. 3. Small remote right corona radiata infarct. Electronically Signed   By: Marnee Spring M.D.   On: 02/19/2017 15:51   Scheduled Meds: .  stroke: mapping our early stages of recovery book   Does not apply Once  . atorvastatin  40 mg Oral q1800  . clopidogrel  75 mg Oral Daily  . heparin  5,000 Units Subcutaneous Q8H  . insulin  aspart  0-9 Units Subcutaneous TID WC  . pantoprazole (PROTONIX) IV  40 mg Intravenous Q12H  . sodium chloride  2 g Oral BID   Continuous Infusions: . sodium chloride      LOS: 1 day   Merlene Laughter, DO [facility] Pager [phone]  If 7PM-7AM, please contact night-coverage [email or web address] Password [facility] 02/21/2017, 8:48 AM

## 2017-02-21 NOTE — Progress Notes (Addendum)
NEUROHOSPITALISTS STROKE TEAM - DAILY PROGRESS NOTE   ADMISSION HISTORY: Kenneth [name] is an 52 y.o. male with PMH of HTN, DM presents naseua, vomiting, dizziness to [facility]. His symptoms began at 9.30 when walking to his car and developed sudden onset nausea and vomiting and gait imablance. He was taken to [facility] and evaluated by Dr Jodi Mourning, a neurologist. NIHSS was 1. He did not get TPA.  CT head showed no hemorrhage.  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 He has been complaining on and off neck pain x 1 week. Denies any recent trauma to neck, chiropractic manipulation, lift heavy weights.   He was transferred to [facility] for diagnostic cerebral angiogram. 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 Still smokes. On arrival patient is stable, complaining of headache.    Date last known well: 1.29.19 Time last known well: 9.30 am   SUBJECTIVE (INTERVAL HISTORY) Wife is at the bedside. Patient is found laying in bed in NAD. Overall he feels his condition is unchanged. Voices no new complaints. No new events reported overnight. Patient continues to complain of H/A 5-9/10 and generalized weakness. Wife states he has no appetite and is intermittently nauseous. No vomiting reported   OBJECTIVE Lab Results: CBC:  Recent Labs  Lab 02/19/17 1118 02/20/17 0841 02/21/17 0918  WBC 10.1 21.2* 10.1  HGB 17.8 17.0 15.8  HCT 52.6* 48.4 46.9  MCV 85.9 86.3 89.0  PLT 310 271 223   BMP: Recent Labs  Lab 02/19/17 1118 02/20/17 0841 02/20/17 1527 02/20/17 2154 02/21/17 0340 02/21/17 0918  NA 134* 139 140 139 139 140  K 3.7  --   --   --  3.8  --   CL 102  --   --   --  107  --   CO2 22  --   --   --  23  --   GLUCOSE 199*  --   --   --  145*  --   BUN 17  --   --   --  10  --   CREATININE 1.06 0.97  --   --  0.93  --   CALCIUM 9.1  --   --   --   8.5*  --   MG 1.9  --   --   --   --   --    Liver Function Tests:  Recent Labs  Lab 02/19/17 1118  AST 21  ALT 12*  ALKPHOS 80  BILITOT 0.7  PROT 7.5  ALBUMIN 4.1   Recent Labs  Lab 02/19/17 1118  LIPASE 50   Thyroid Function Studies:  Recent Labs    02/20/17 1527  TSH 0.765   Cardiac Enzymes:  Recent Labs  Lab 02/19/17 1118 02/19/17 1957  TROPONINI 0.04* 0.05*   Urine Drug Screen:     Component Value Date/Time   LABOPIA POSITIVE (A) 02/20/2017 1600   COCAINSCRNUR NONE DETECTED 02/20/2017 1600   COCAINSCRNUR NONE DETECTED 12/29/2015 0823   LABBENZ POSITIVE (A) 02/20/2017 1600   AMPHETMU NONE DETECTED 02/20/2017 1600   THCU NONE DETECTED 02/20/2017 1600   LABBARB POSITIVE (A) 02/20/2017 1600    PHYSICAL EXAM Temp:  [97.6 F (36.4 C)-98.7 F (37.1 C)] 98.4 F (36.9 C) (01/31 1100) Pulse Rate:  [62-84] 84 (01/31 0400) Resp:  [11-17] 17 (01/31 0400) BP: (169-182)/(100-119) 179/102 (01/31 0400) SpO2:  [97 %-98 %] 98 % (01/31  0700) General - Well nourished, well developed, in no apparent distress HEENT-  Normocephalic, Cardiovascular - Regular rate and rhythm  Respiratory - Lungs clear bilaterally. No wheezing. Abdomen - soft and non-tender, BS normal Extremities- no edema or cyanosis Neurological Examination Mental Status: Alert, oriented, thought content appropriate.  Speech fluent without evidence of aphasia. Able to follow 3 step commands without difficulty. Cranial Nerves: II: Visual fields grossly normal,  III,IV, VI: ptosis not present, extra-ocular motions intact bilaterally, pupils equal, round, reactive to light and accommodation V,VII: smile symmetric, facial light touch sensation normal bilaterally VIII: hearing normal bilaterally IX,X: uvula rises symmetrically XI: bilateral shoulder shrug XII: midline tongue extension Motor: Right : Upper extremity   5/5    Left:     Upper extremity   5/5  Lower extremity   5/5     Lower extremity    5/5 Tone and bulk:normal tone throughout; no atrophy noted Sensory: Pinprick and light touch intact throughout, bilaterally Deep Tendon Reflexes: 2+ and symmetric throughout Plantars: Right: downgoing   Left: downgoing Cerebellar: normal finger-to-nose, normal rapid alternating movements and normal heel-to-shin test Gait: not tested  IMAGING: I have personally reviewed the radiological images below and agree with the radiology interpretations.  Kenneth [name] Neck W Wo Contrast Result Date: 02/20/2017 IMPRESSION: 1. 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 The vessel appears to be reconstituted in the distal V2 segment. 2. Negative MRI appearance of the left vertebral artery and bilateral cervical carotid arteries. Electronically Signed   By: Odessa Fleming M.D.   On: 02/20/2017 12:54   Kenneth Brain Wo Contrast  Result Date: 02/19/2017 IMPRESSION: 1. 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 2. No hydrocephalus. 3. Small remote right corona radiata infarct. Electronically Signed   By: Marnee Spring M.D.   On: 02/19/2017 15:51   Ct Angio Head/ Neck W Or Wo Contrast Result Date: 02/19/2017  IMPRESSION: 1. The right vertebral artery is abnormal throughout its course. 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 2. Unfortunately the distal right vertebral artery is dominant. The left vertebral is normal but diminutive beyond the left PICA. Still, the vertebrobasilar junction, basilar artery, and other posterior circulation remain patent. 3. Stable CT appearance of the brain since 1156 hours today with no acute infarct evident. 4. Minimal carotid atherosclerosis, no carotid stenosis, and otherwise negative anterior circulation. Electronically Signed   By: Odessa Fleming M.D.   On: 02/19/2017 13:51   Ct Head Wo  Contrast Result Date: 02/19/2017 IMPRESSION: 1. No evidence of acute intracranial abnormality. 2. 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 Electronically Signed   By: Sebastian Ache M.D.   On: 02/19/2017 12:10   Echocardiogram:                                               Study Conclusions  - Left ventricle: The cavity size was normal. Systolic function was   moderately reduced. The estimated ejection fraction was in the   range of 35% to 40%. Images were inadequate for LV wall motion   assessment and cannot visualize the apex adequately. Features are   consistent with a pseudonormal left ventricular filling pattern,   with concomitant abnormal relaxation and increased  filling   pressure (grade 2 diastolic dysfunction). Doppler parameters are   consistent with high ventricular filling pressure. - Aortic valve: Trileaflet; normal thickness, mildly calcified   leaflets. - Mitral valve: There was trivial regurgitation. - Left atrium: The atrium was mildly dilated. - Pulmonary arteries: PA peak pressure: 34 mm Hg (S). - Pericardium, extracardiac: A trivial pericardial effusion was   identified posterior to the heart. - Recommendations: Limited study with definity contrast to assess   wall motion and rule out apical thrombus.  Repeat CT Head:                                             PENDING TEE:                                                                   PENDING    IMPRESSION: Kenneth [name] is a 52 y.o. male with PMH of CAD, HTN, HLD, DM presenting with intractable nausea and vomiting and gait imbalance. Neck pain x 1 week. Marland Kitchen  Head CT reviewed and shows no acute changes.  CTA of the head and neck shows what appears to be a right vertebral dissection.  Patient noncompliant with home medications. 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  Suspected  Etiology: dissection vs athero vs cardioembolic Resultant Symptoms: Ataxia, Neck Pain, H/A, Nausea Stroke Risk Factors: diabetes mellitus, hyperlipidemia, hypertension and smoking Other Stroke Risk Factors: Advanced age, Hx stroke, CAD  Outstanding Stroke Work-up Studies:     TEE:                                             PENDING Repeat Head CT:                         PENDING  02/21/2017 ASSESSMENT:   Neuro exam unchanged. Patient continues to complain of H/A, neck pain and nausea. Was able to ambulate OOB with PT today. Na slowly trending up. Will continue Na tabs for now. Repeat Head CT and TEE scheduled for AM. Will continue to monitor closely.  PLAN  02/21/2017: Continue Aspirin/ Plavix/Statin Repeat Head CT in AM - obtain STAT if Neuro exam acutely worsens Frequent neuro checks Telemetry monitoring PT/OT/SLP Consult PM & Rehab Consult Case Management /MSW TEE in AM - scheduled per primary team Ongoing aggressive stroke risk factor management Patient counseled to be compliant with his antithrombotic medications Patient counseled on Lifestyle modifications including, Diet, Exercise, and Stress Follow up with [facility] in 6 weeks  HX OF STROKES: Small remote right corona radiata infarct  Risk for cerebral edema  Moderate size cerebellar stroke, low risk of herniation or hydrocephalus,  Fourth ventricle currently open NA 140 today, will continue salt tabs for now Repeat CT tomorrow, obtain STAT if Neuro exam acutely worsens  HYPERTENSION: Unstable, some elevated B/P's noted overnight Permissive hypertension (OK if <220/120) for 24-48  hours post stroke and then gradually normalized within 5-7 days. Long term BP goal normotensive. May slowly restart home B/P medications after 48 hours Home Meds: Lisinopril  HYPERLIPIDEMIA:    Component Value Date/Time   CHOL 141 02/21/2017 0340   CHOL 174 07/30/2014 0902   TRIG 253 (H) 02/21/2017 0340   HDL 27 (L)  02/21/2017 0340   HDL 31 (L) 07/30/2014 0902   CHOLHDL 5.2 02/21/2017 0340   VLDL 51 (H) 02/21/2017 0340   LDLCALC 63 02/21/2017 0340   LDLCALC 111 (H) 07/30/2014 0902  Home Meds:  Lipitor 80 mg LDL  goal < 70 Continued on Lipitor to 80 mg daily Continue statin at discharge  DIABETES: Lab Results  Component Value Date   HGBA1C 7.7 (H) 02/19/2017  HgbA1c goal < 7.0 Currently on: Novolog Continue CBG monitoring and SSI to maintain glucose 140-180 mg/dl DM education   TOBACCO ABUSE Current smoker Smoking cessation counseling provided Nicotine patch provided  Other Active Problems: Active Problems:   Ischemic stroke (HCC)   Type 2 diabetes mellitus with hyperlipidemia (HCC)   Uncontrolled type 2 diabetes mellitus with hyperglycemia (HCC)   Tobacco dependence    Hospital day # 1 VTE prophylaxis:  Heparin Diet : Fall precautions Diet regular Room service appropriate? Yes; Fluid consistency: Thin   FAMILY UPDATES:  family at bedside  TEAM UPDATES: Merlene LaughterSheikh, Omair Latif, DO   Prior Home Stroke Medications:  aspirin 81 mg daily  Discharge Stroke Meds:  Please discharge patient on aspirin 325 mg daily   Disposition: [facility] Therapy Recs:               PENDING Follow Up:  Follow-up Information    [facility] Follow up on 03/04/2017.   Why:  9 am for hospital follow up Contact information: [address] [id number] [place] [id number] [phone]       Nilda RiggsMartin, Nancy Carolyn, NP. Schedule an appointment as soon as possible for a visit in 6 week(s).   Specialty:  Family Medicine Contact information: [address] [place] [address] [phone]          Anola Gurneyhauvin, Robert, [place] -PCP Follow up in 1-2 weeks      Assessment & plan discussed with with attending physician and they are in agreement.    Beryl MeagerMary A Costello, ANP-C Stroke Neurology Team 02/21/2017 1:50 PM  ATTENDING NOTE: I  reviewed above note and agree with the assessment and plan. I have made any additions or clarifications directly to the above note. Pt was seen and examined.   52 year old male with history of diabetes, hypertension, smoker admitted for dizziness, nausea and vomiting.  He admitted on and off neck pain for the last 1 week.  CT head no hemorrhage.  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  MRI showed right cerebellum infarct and remote right CR infarct.  EF 35-40%, not able to rule out LV thrombosis.  Will need TTE with contrast versus TEE in a.m.  LDL 63, TG 253 and A1c 7.7.  His TG much improved from 1 year ago.  Currently on aspirin, Plavix and Lipitor.  Patient had a stroke 12/2015 with left upper extremity numbness, gait imbalance and left-sided coordination difficulty.  MRI showed right BG/CR infarct.  Carotid Doppler negative.  EF 30-35%.  [facility]  TG  2631, A1c 9.5.  He is not compliant with medication at home.  Patient today continued to complain of severe headache with nausea vomiting.  Add Fioricet for headache treatment.  Will repeat CT stat to rule out hydrocephalus.  Marvel Plan, MD PhD Stroke Neurology 02/21/2017 7:18 PM  I spent  35 minutes in total face-to-face time with the patient, more than 50% of which was spent in counseling and coordination of care, reviewing test results, images and medication, and discussing the diagnosis of vertebral dissection, cerebellum infarct, severe headache, nausea vomiting, treatment plan and potential prognosis. This patient's care requiresreview of multiple databases, neurological assessment, discussion with family, other specialists and medical decision making of high complexity.       To contact Stroke Continuity provider, please refer to [email or web address]. After hours, contact General Neurology

## 2017-02-21 NOTE — Care Management Note (Signed)
Case Management Note  Patient Details  Name: Jonell CluckJimmy R Wachs MRN: [id number] Date of Birth: 05/28/1965  Subjective/Objective:    From home with wife,presenting with intractable nausea and vomiting and gait imbalance with neck pain.   MRI brain shows right cerebellar stroke.  He does not have insurance or PCP.  NCM [facility] am. He will be [facility] if dc during the week, if dc over the weekend will need ast for meds with [facility].                 Action/Plan: NCM will follow for dc needs.   Expected Discharge Date:                  Expected Discharge Plan:     In-House Referral:     Discharge planning Services  CM Consult, Follow-up appt scheduled, Indigent Health Clinic, Medication Assistance  Post Acute Care Choice:    Choice offered to:     DME Arranged:    DME Agency:     HH Arranged:    HH Agency:     Status of Service:  In process, will continue to follow  If discussed at Long Length of Stay Meetings, dates discussed:    Additional Comments:  Leone Havenaylor, Mordche Hedglin Clinton, RN 02/21/2017, 12:27 PM

## 2017-02-21 NOTE — H&P (Signed)
Follow up CT head reveals evolving RIGHT posterior-inferior cerebellar artery territory infarct with increasing edema of the right cerebellar infarct and worsened mass effect. There is associated new mild obstructive hydrocephalus.   A/R: 52 year old male with right PICA ischemic infarction secondary to right vertebral artery dissection. 1. Worsening mass effect with new mild obstructive hydrocephalus. At risk for development of herniation given the above findings.  2. Starting hypertonic saline 3% at 50 cc/hr. 3. Will obtain Neurosurgery consult to have them on board in case emergent decompressive suboccipital craniotomy or ventriculostomy are indicated 4. Repeat CT head in 12 hours 5. Frequent neuro checks.  6. Will transfer to ICU.   Electronically signed: Dr. Caryl PinaEric Niala Stcharles

## 2017-02-21 NOTE — Evaluation (Addendum)
Physical Therapy Evaluation Patient Details Name: Kenneth CluckJimmy R [name] MRN: [id number] DOB: 11/26/1965 Today's Date: 02/21/2017   History of Present Illness  Pt is a 52 y.o. male presenting with intractable nausea and vomiting and gait imbalance with neck pain x 1 week. 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 PMH includes HTN, DM, CAD.   Clinical Impression  Pt admitted with above diagnosis. Pt currently with functional limitations due to the deficits listed below (see PT Problem List). Pt was able to ambulate with RW with min guard assist with one LOB which pt self corrected.  Anticipate pt will recover quickly however pt does have safety awareness issues and is highly anxious.  Explained to pt that he would benefit from decreasing his stress. Will follow acutely.  BP 147/81 initially, 183/97 after speaking with pt.  Other VSS.  j Pt will benefit from skilled PT to increase their independence and safety with mobility to allow discharge to the venue listed below.      Follow Up Recommendations Home health PT;Supervision/Assistance - 24 hour    Equipment Recommendations  Other (comment)(TBA)    Recommendations for Other Services       Precautions / Restrictions Precautions Precautions: Fall Restrictions Weight Bearing Restrictions: No      Mobility  Bed Mobility Overal bed mobility: Independent                Transfers Overall transfer level: Needs assistance Equipment used: Rolling walker (2 wheeled) Transfers: Sit to/from Stand Sit to Stand: Supervision            Ambulation/Gait Ambulation/Gait assistance: Min guard Ambulation Distance (Feet): 110 Feet Assistive device: Rolling walker (2 wheeled) Gait Pattern/deviations: Step-through pattern;Decreased stride length   Gait velocity interpretation: Below normal speed for age/gender General Gait Details: Pt overall did well with RW for support and balance as pt did lose balance x 1 but did self correct.   Pt was impulsive at times.  Pt reports he is dizzy and tried to get pt to focus on the target "A" but pt kept looking away.  Lost balance due to not using compensatory strategies but pt internally distracted as he states he wants to go home but does not appear to understand and acknowledge his deficits.  Poor safety awareness.    Stairs            Wheelchair Mobility    Modified Rankin (Stroke Patients Only) Modified Rankin (Stroke Patients Only) Pre-Morbid Rankin Score: No symptoms Modified Rankin: Moderately severe disability     Balance Overall balance assessment: Needs assistance;History of Falls Sitting-balance support: No upper extremity supported;Feet supported Sitting balance-Leahy Scale: Fair     Standing balance support: Bilateral upper extremity supported;During functional activity Standing balance-Leahy Scale: Poor Standing balance comment: relies on UE support for balance                             Pertinent Vitals/Pain Pain Assessment: Faces Faces Pain Scale: Hurts even more Pain Location: headache/neck Pain Descriptors / Indicators: Headache Pain Intervention(s): Limited activity within patient's tolerance;Monitored during session;Repositioned    Home Living Family/patient expects to be discharged to:: Private residence Living Arrangements: Spouse/significant other Available Help at Discharge: Family Type of Home: House Home Access: Stairs to enter Entrance Stairs-Rails: None Entrance Stairs-Number of Steps: 2   Home Equipment: None      Prior Function Level of Independence: Independent  Comments: Pt independent with all mobility and ADLs, driving and works full time in [employer].      Hand Dominance   Dominant Hand: Right    Extremity/Trunk Assessment   Upper Extremity Assessment Upper Extremity Assessment: Defer to OT evaluation    Lower Extremity Assessment Lower Extremity Assessment: Generalized weakness     Cervical / Trunk Assessment Cervical / Trunk Assessment: Normal  Communication   Communication: No difficulties  Cognition Arousal/Alertness: Awake/alert Behavior During Therapy: Anxious;Impulsive Overall Cognitive Status: Impaired/Different from baseline Area of Impairment: Following commands;Safety/judgement;Problem solving                       Following Commands: Follows one step commands consistently Safety/Judgement: Decreased awareness of safety   Problem Solving: Requires verbal cues        General Comments  Nystagmus noted - direction changing    Exercises     Assessment/Plan    PT Assessment Patient needs continued PT services  PT Problem List Decreased strength;Decreased activity tolerance;Decreased balance;Decreased mobility;Decreased knowledge of use of DME;Decreased safety awareness;Decreased knowledge of precautions;Decreased cognition       PT Treatment Interventions DME instruction;Gait training;Functional mobility training;Therapeutic activities;Therapeutic exercise;Stair training;Balance training;Patient/family education;Neuromuscular re-education    PT Goals (Current goals can be found in the Care Plan section)  Acute Rehab PT Goals Patient Stated Goal: to gohome PT Goal Formulation: With patient Time For Goal Achievement: 03/07/17 Potential to Achieve Goals: Good    Frequency Min 4X/week   Barriers to discharge        Co-evaluation               AM-PAC PT "6 Clicks" Daily Activity  Outcome Measure Difficulty turning over in bed (including adjusting bedclothes, sheets and blankets)?: None Difficulty moving from lying on back to sitting on the side of the bed? : None Difficulty sitting down on and standing up from a chair with arms (e.g., wheelchair, bedside commode, etc,.)?: None Help needed moving to and from a bed to chair (including a wheelchair)?: A Little Help needed walking in hospital room?: A Little Help needed  climbing 3-5 steps with a railing? : A Little 6 Click Score: 21    End of Session Equipment Utilized During Treatment: Gait belt Activity Tolerance: Patient limited by fatigue Patient left: with call bell/phone within reach;in bed;with bed alarm set;with family/visitor present Nurse Communication: Mobility status PT Visit Diagnosis: Muscle weakness (generalized) (M62.81);Unsteadiness on feet (R26.81)    Time: 6578-4696 PT Time Calculation (min) (ACUTE ONLY): 18 min   Charges:   PT Evaluation $PT Eval Moderate Complexity: 1 Mod     PT G Codes:        Terisa Belardo,PT Acute Rehabilitation [phone]  Berline Lopes 02/21/2017, 10:08 AM

## 2017-02-22 ENCOUNTER — Inpatient Hospital Stay (HOSPITAL_COMMUNITY): Payer: Self-pay

## 2017-02-22 DIAGNOSIS — I639 Cerebral infarction, unspecified: Secondary | ICD-10-CM

## 2017-02-22 DIAGNOSIS — I6350 Cerebral infarction due to unspecified occlusion or stenosis of unspecified cerebral artery: Secondary | ICD-10-CM

## 2017-02-22 DIAGNOSIS — I7774 Dissection of vertebral artery: Secondary | ICD-10-CM

## 2017-02-22 DIAGNOSIS — I361 Nonrheumatic tricuspid (valve) insufficiency: Secondary | ICD-10-CM

## 2017-02-22 LAB — CBC WITH DIFFERENTIAL/PLATELET
BASOS ABS: 0 10*3/uL (ref 0.0–0.1)
Basophils Relative: 0 %
Eosinophils Absolute: 0 10*3/uL (ref 0.0–0.7)
Eosinophils Relative: 0 %
HEMATOCRIT: 49.8 % (ref 39.0–52.0)
Hemoglobin: 17.5 g/dL — ABNORMAL HIGH (ref 13.0–17.0)
Lymphocytes Relative: 16 %
Lymphs Abs: 1.9 10*3/uL (ref 0.7–4.0)
MCH: 30.2 pg (ref 26.0–34.0)
MCHC: 35.1 g/dL (ref 30.0–36.0)
MCV: 85.9 fL (ref 78.0–100.0)
MONO ABS: 0.8 10*3/uL (ref 0.1–1.0)
MONOS PCT: 7 %
NEUTROS ABS: 9.2 10*3/uL — AB (ref 1.7–7.7)
Neutrophils Relative %: 77 %
Platelets: 236 10*3/uL (ref 150–400)
RBC: 5.8 MIL/uL (ref 4.22–5.81)
RDW: 12.8 % (ref 11.5–15.5)
WBC: 11.9 10*3/uL — ABNORMAL HIGH (ref 4.0–10.5)

## 2017-02-22 LAB — SODIUM
SODIUM: 137 mmol/L (ref 135–145)
Sodium: 138 mmol/L (ref 135–145)
Sodium: 143 mmol/L (ref 135–145)

## 2017-02-22 LAB — COMPREHENSIVE METABOLIC PANEL
ALK PHOS: 81 U/L (ref 38–126)
ALT: 12 U/L — ABNORMAL LOW (ref 17–63)
AST: 19 U/L (ref 15–41)
Albumin: 3.8 g/dL (ref 3.5–5.0)
Anion gap: 12 (ref 5–15)
BILIRUBIN TOTAL: 1.1 mg/dL (ref 0.3–1.2)
BUN: 9 mg/dL (ref 6–20)
CALCIUM: 9.1 mg/dL (ref 8.9–10.3)
CO2: 25 mmol/L (ref 22–32)
Chloride: 102 mmol/L (ref 101–111)
Creatinine, Ser: 0.84 mg/dL (ref 0.61–1.24)
GFR calc Af Amer: >60 mL/min (ref >60)
Glucose, Bld: 134 mg/dL — ABNORMAL HIGH (ref 65–99)
POTASSIUM: 3.3 mmol/L — AB (ref 3.5–5.1)
Sodium: 139 mmol/L (ref 135–145)
TOTAL PROTEIN: 6.9 g/dL (ref 6.5–8.1)

## 2017-02-22 LAB — PHOSPHORUS: Phosphorus: 3.2 mg/dL (ref 2.5–4.6)

## 2017-02-22 LAB — GLUCOSE, CAPILLARY
GLUCOSE-CAPILLARY: 144 mg/dL — AB (ref 65–99)
GLUCOSE-CAPILLARY: 244 mg/dL — AB (ref 65–99)
GLUCOSE-CAPILLARY: 204 mg/dL — AB (ref 65–99)
Glucose-Capillary: 287 mg/dL — ABNORMAL HIGH (ref 65–99)

## 2017-02-22 LAB — ECHOCARDIOGRAM LIMITED
HEIGHTINCHES: 70 in
WEIGHTICAEL: 2694.9 [oz_av]

## 2017-02-22 LAB — MAGNESIUM: MAGNESIUM: 2 mg/dL (ref 1.7–2.4)

## 2017-02-22 MED ORDER — SODIUM CHLORIDE 23.4 % INJECTION (4 MEQ/ML) FOR IV ADMINISTRATION
30.0000 mL | Freq: Once | INTRAVENOUS | Status: AC
  Administered 2017-02-22: 30 mL via INTRAVENOUS
  Filled 2017-02-22: qty 30

## 2017-02-22 MED ORDER — POTASSIUM CHLORIDE CRYS ER 20 MEQ PO TBCR
40.0000 meq | EXTENDED_RELEASE_TABLET | Freq: Two times a day (BID) | ORAL | Status: DC
  Administered 2017-02-22 – 2017-02-27 (×11): 40 meq via ORAL
  Filled 2017-02-22 (×11): qty 2

## 2017-02-22 MED ORDER — PERFLUTREN LIPID MICROSPHERE
1.0000 mL | INTRAVENOUS | Status: AC | PRN
  Administered 2017-02-22: 2 mL via INTRAVENOUS
  Filled 2017-02-22: qty 10

## 2017-02-22 MED ORDER — LISINOPRIL 10 MG PO TABS
10.0000 mg | ORAL_TABLET | Freq: Two times a day (BID) | ORAL | Status: DC
  Administered 2017-02-22 – 2017-02-23 (×4): 10 mg via ORAL
  Filled 2017-02-22 (×4): qty 1

## 2017-02-22 MED ORDER — SENNOSIDES-DOCUSATE SODIUM 8.6-50 MG PO TABS
1.0000 | ORAL_TABLET | Freq: Every day | ORAL | Status: DC
  Administered 2017-02-22 – 2017-02-23 (×2): 1 via ORAL
  Filled 2017-02-22 (×2): qty 1

## 2017-02-22 MED ORDER — LIDOCAINE HCL (PF) 1 % IJ SOLN
INTRAMUSCULAR | Status: AC
  Administered 2017-02-22: 5 mL
  Filled 2017-02-22: qty 5

## 2017-02-22 MED ORDER — SODIUM CHLORIDE 0.9% FLUSH: 10.0000 mL | INTRAVENOUS | Status: DC | PRN

## 2017-02-22 MED ORDER — SODIUM CHLORIDE 0.9% FLUSH
10.0000 mL | Freq: Two times a day (BID) | INTRAVENOUS | Status: DC
  Administered 2017-02-22: 30 mL
  Administered 2017-02-23: 40 mL
  Administered 2017-02-23: 10 mL
  Administered 2017-02-24 – 2017-02-25 (×2): 20 mL
  Administered 2017-02-26: 10 mL

## 2017-02-22 MED ORDER — CHLORHEXIDINE GLUCONATE CLOTH 2 % EX PADS
6.0000 | MEDICATED_PAD | Freq: Every day | CUTANEOUS | Status: DC
  Administered 2017-02-22 – 2017-02-26 (×4): 6 via TOPICAL

## 2017-02-22 MED ORDER — SODIUM CHLORIDE 3 % IV SOLN
INTRAVENOUS | Status: DC
  Administered 2017-02-22: 50 mL/h via INTRAVENOUS
  Administered 2017-02-23 – 2017-02-25 (×7): 75 mL/h via INTRAVENOUS
  Administered 2017-02-25: 37.5 mL/h via INTRAVENOUS
  Filled 2017-02-22 (×22): qty 500

## 2017-02-22 MED ORDER — LABETALOL HCL 5 MG/ML IV SOLN
5.0000 mg | INTRAVENOUS | Status: DC | PRN
  Administered 2017-02-22 – 2017-02-25 (×8): 5 mg via INTRAVENOUS
  Filled 2017-02-22 (×10): qty 4

## 2017-02-22 MED ORDER — POLYETHYLENE GLYCOL 3350 17 G PO PACK
17.0000 g | PACK | Freq: Every day | ORAL | Status: DC
  Administered 2017-02-22: 17 g via ORAL
  Filled 2017-02-22: qty 1

## 2017-02-22 MED ORDER — ENALAPRILAT 1.25 MG/ML IV SOLN
1.2500 mg | Freq: Once | INTRAVENOUS | Status: AC
  Administered 2017-02-22: 1.25 mg via INTRAVENOUS
  Filled 2017-02-22: qty 1

## 2017-02-22 NOTE — Consult Note (Signed)
Reason for Consult: Right PICA ischemic stroke, mild hydrocephalus Referring Physician: Dr. [name] is an 52 y.o. male.  HPI: The patient is a 52 year old white male who was admitted with a severe headache and vertebral artery dissection.  He has had a right PICA infarct.  A follow-up head CT demonstrated the expected evolution of his right PICA infarct and minimal ventricular enlargement.  I was asked to see the patient in case he needs a ventriculostomy at some point in the future.  Presently the patient is alert and pleasant.  He tells me he has a "minimal headache".  Past Medical History:  Diagnosis Date  . Coronary artery disease   . Diabetes mellitus without complication ([place])   . Hypertension     Past Surgical History:  Procedure Laterality Date  . CORONARY ANGIOPLASTY WITH STENT PLACEMENT  06/18/2008  . CORONARY ANGIOPLASTY WITH STENT PLACEMENT      Family History  Problem Relation Age of Onset  . Heart attack Mother   . Heart failure Mother   . Coronary artery disease Father   . Diabetes Father     Social History:  reports that he has been smoking cigarettes.  He has been smoking about 2.00 packs per day. he has never used smokeless tobacco. He reports that he does not drink alcohol or use drugs.  Allergies: No Known Allergies  Medications:  I have reviewed the patient's current medications. Prior to Admission:  Medications Prior to Admission  Medication Sig Dispense Refill Last Dose  . aspirin EC 81 MG tablet Take 1 tablet (81 mg total) by mouth daily. (Patient not taking: Reported on 02/20/2017)   Not Taking at Unknown time  . atorvastatin (LIPITOR) 80 MG tablet Take 1 tablet (80 mg total) by mouth daily. (Patient not taking: Reported on 02/20/2017) 90 tablet 3 Not Taking at Unknown time  . buPROPion (WELLBUTRIN SR) 100 MG 12 hr tablet Take 100 mg by mouth 2 (two) times daily.    12/28/2015 at  1800  . clopidogrel (PLAVIX) 75 MG tablet Take 1 tablet (75  mg total) by mouth daily. (Patient not taking: Reported on 02/20/2017) 30 tablet 0 Not Taking at Unknown time  . gemfibrozil (LOPID) 600 MG tablet Take 1 tablet (600 mg total) by mouth 2 (two) times daily before a meal. (Patient not taking: Reported on 02/20/2017) 60 tablet 0 Not Taking at Unknown time  . glimepiride (AMARYL) 4 MG tablet Take 1 tablet (4 mg total) by mouth daily with breakfast. (Patient not taking: Reported on 02/20/2017) 30 tablet 0 Not Taking at Unknown time  . lisinopril (PRINIVIL,ZESTRIL) 20 MG tablet Take 1 tablet (20 mg total) by mouth daily. Two tablets in the morning and 1 tablet in the evening (Patient not taking: Reported on 02/20/2017) 60 tablet 0 Not Taking at Unknown time  . metFORMIN (GLUCOPHAGE) 1000 MG tablet Take 1,000 mg by mouth 2 (two) times daily with a meal.   Not Taking at Unknown time  . nicotine (NICODERM CQ - DOSED IN MG/24 HOURS) 21 mg/24hr patch Place 1 patch (21 mg total) onto the skin daily. (Patient not taking: Reported on 02/20/2017) 28 patch 0 Not Taking at Unknown time   Scheduled: .  stroke: mapping our early stages of recovery book   Does not apply Once  . aspirin EC  325 mg Oral Daily  . atorvastatin  80 mg Oral q1800  . clopidogrel  75 mg Oral Daily  . heparin  5,000  Units Subcutaneous Q8H  . insulin aspart  0-9 Units Subcutaneous TID WC  . pantoprazole  40 mg Oral BID  . sodium chloride  2 g Oral BID   Continuous: . sodium chloride (hypertonic) 50 mL/hr at 02/22/17 0200   TKZ:[id number] **OR** acetaminophen (TYLENOL) oral liquid 160 mg/5 mL **OR** acetaminophen, butalbital-acetaminophen-caffeine, morphine injection, nicotine, ondansetron (ZOFRAN) IV, senna-docusate Anti-infectives (From admission, onward)   None       Results for orders placed or performed during the hospital encounter of 02/20/17 (from the past 48 hour(s))  HIV antibody (Routine Testing)     Status: None   Collection Time: 02/20/17  8:41 AM  Result Value Ref Range    HIV Screen 4th Generation wRfx Non Reactive Non Reactive    Comment: (NOTE) Performed At: [facility] [place], [place] [id number] Rush Farmer MD [id number]   CBC     Status: Abnormal   Collection Time: 02/20/17  8:41 AM  Result Value Ref Range   WBC 21.2 (H) 4.0 - 10.5 K/uL    Comment: WHITE COUNT CONFIRMED ON SMEAR   RBC 5.61 4.22 - 5.81 MIL/uL   Hemoglobin 17.0 13.0 - 17.0 g/dL   HCT 48.4 39.0 - 52.0 %   MCV 86.3 78.0 - 100.0 fL   MCH 30.3 26.0 - 34.0 pg   MCHC 35.1 30.0 - 36.0 g/dL   RDW 13.1 11.5 - 15.5 %   Platelets 271 150 - 400 K/uL  Creatinine, serum     Status: None   Collection Time: 02/20/17  8:41 AM  Result Value Ref Range   Creatinine, Ser 0.97 0.61 - 1.24 mg/dL   GFR calc non Af Amer >60 >60 mL/min   GFR calc Af Amer >60 >60 mL/min    Comment: (NOTE) The eGFR has been calculated using the CKD EPI equation. This calculation has not been validated in all clinical situations. eGFR's persistently <60 mL/min signify possible Chronic Kidney Disease.   Sodium     Status: None   Collection Time: 02/20/17  8:41 AM  Result Value Ref Range   Sodium 139 135 - 145 mmol/L  Sodium     Status: None   Collection Time: 02/20/17  3:27 PM  Result Value Ref Range   Sodium 140 135 - 145 mmol/L  TSH     Status: None   Collection Time: 02/20/17  3:27 PM  Result Value Ref Range   TSH 0.765 0.350 - 4.500 uIU/mL    Comment: Performed by a 3rd Generation assay with a functional sensitivity of <=0.01 uIU/mL.  Urine rapid drug screen (hosp performed)     Status: Abnormal   Collection Time: 02/20/17  4:00 PM  Result Value Ref Range   Opiates POSITIVE (A) NONE DETECTED   Cocaine NONE DETECTED NONE DETECTED   Benzodiazepines POSITIVE (A) NONE DETECTED   Amphetamines NONE DETECTED NONE DETECTED   Tetrahydrocannabinol NONE DETECTED NONE DETECTED   Barbiturates POSITIVE (A) NONE DETECTED    Comment: (NOTE) DRUG SCREEN FOR MEDICAL PURPOSES ONLY.  IF  CONFIRMATION IS NEEDED FOR ANY PURPOSE, NOTIFY LAB WITHIN 5 DAYS. LOWEST DETECTABLE LIMITS FOR URINE DRUG SCREEN Drug Class                     Cutoff (ng/mL) Amphetamine and metabolites    1000 Barbiturate and metabolites    200 Benzodiazepine                 160 Tricyclics and  metabolites     300 Opiates and metabolites        300 Cocaine and metabolites        300 THC                            50   Glucose, capillary     Status: Abnormal   Collection Time: 02/20/17  4:20 PM  Result Value Ref Range   Glucose-Capillary 168 (H) 65 - 99 mg/dL  Glucose, capillary     Status: Abnormal   Collection Time: 02/20/17  7:37 PM  Result Value Ref Range   Glucose-Capillary 154 (H) 65 - 99 mg/dL  Sodium     Status: None   Collection Time: 02/20/17  9:54 PM  Result Value Ref Range   Sodium 139 135 - 145 mmol/L  Basic metabolic panel     Status: Abnormal   Collection Time: 02/21/17  3:40 AM  Result Value Ref Range   Sodium 139 135 - 145 mmol/L   Potassium 3.8 3.5 - 5.1 mmol/L   Chloride 107 101 - 111 mmol/L   CO2 23 22 - 32 mmol/L   Glucose, Bld 145 (H) 65 - 99 mg/dL   BUN 10 6 - 20 mg/dL   Creatinine, Ser 0.93 0.61 - 1.24 mg/dL   Calcium 8.5 (L) 8.9 - 10.3 mg/dL   GFR calc non Af Amer >60 >60 mL/min   GFR calc Af Amer >60 >60 mL/min    Comment: (NOTE) The eGFR has been calculated using the CKD EPI equation. This calculation has not been validated in all clinical situations. eGFR's persistently <60 mL/min signify possible Chronic Kidney Disease.    Anion gap 9 5 - 15  Lipid panel     Status: Abnormal   Collection Time: 02/21/17  3:40 AM  Result Value Ref Range   Cholesterol 141 0 - 200 mg/dL   Triglycerides 253 (H) <150 mg/dL   HDL 27 (L) >40 mg/dL   Total CHOL/HDL Ratio 5.2 RATIO   VLDL 51 (H) 0 - 40 mg/dL   LDL Cholesterol 63 0 - 99 mg/dL    Comment:        Total Cholesterol/HDL:CHD Risk Coronary Heart Disease Risk Table                     Men   Women  1/2 Average Risk    3.4   3.3  Average Risk       5.0   4.4  2 X Average Risk   9.6   7.1  3 X Average Risk  23.4   11.0        Use the calculated Patient Ratio above and the CHD Risk Table to determine the patient's CHD Risk.        ATP III CLASSIFICATION (LDL):  <100     mg/dL   Optimal  100-129  mg/dL   Near or Above                    Optimal  130-159  mg/dL   Borderline  160-189  mg/dL   High  >190     mg/dL   Very High   Glucose, capillary     Status: Abnormal   Collection Time: 02/21/17  7:38 AM  Result Value Ref Range   Glucose-Capillary 148 (H) 65 - 99 mg/dL  Sodium     Status: None  Collection Time: 02/21/17  9:18 AM  Result Value Ref Range   Sodium 140 135 - 145 mmol/L  CBC with Differential/Platelet     Status: None   Collection Time: 02/21/17  9:18 AM  Result Value Ref Range   WBC 10.1 4.0 - 10.5 K/uL   RBC 5.27 4.22 - 5.81 MIL/uL   Hemoglobin 15.8 13.0 - 17.0 g/dL   HCT 46.9 39.0 - 52.0 %   MCV 89.0 78.0 - 100.0 fL   MCH 30.0 26.0 - 34.0 pg   MCHC 33.7 30.0 - 36.0 g/dL   RDW 13.4 11.5 - 15.5 %   Platelets 223 150 - 400 K/uL   Neutrophils Relative % 69 %   Neutro Abs 6.9 1.7 - 7.7 K/uL   Lymphocytes Relative 25 %   Lymphs Abs 2.5 0.7 - 4.0 K/uL   Monocytes Relative 6 %   Monocytes Absolute 0.6 0.1 - 1.0 K/uL   Eosinophils Relative 0 %   Eosinophils Absolute 0.0 0.0 - 0.7 K/uL   Basophils Relative 0 %   Basophils Absolute 0.0 0.0 - 0.1 K/uL  Glucose, capillary     Status: Abnormal   Collection Time: 02/21/17 11:40 AM  Result Value Ref Range   Glucose-Capillary 148 (H) 65 - 99 mg/dL  Sodium     Status: None   Collection Time: 02/21/17  3:53 PM  Result Value Ref Range   Sodium 139 135 - 145 mmol/L  Glucose, capillary     Status: Abnormal   Collection Time: 02/21/17  4:37 PM  Result Value Ref Range   Glucose-Capillary 148 (H) 65 - 99 mg/dL  Glucose, capillary     Status: Abnormal   Collection Time: 02/21/17  8:52 PM  Result Value Ref Range   Glucose-Capillary 169  (H) 65 - 99 mg/dL  Sodium     Status: None   Collection Time: 02/21/17  9:34 PM  Result Value Ref Range   Sodium 135 135 - 145 mmol/L  Sodium     Status: None   Collection Time: 02/22/17 12:39 AM  Result Value Ref Range   Sodium 137 135 - 145 mmol/L    Dg Chest 2 View  Result Date: 02/20/2017 CLINICAL DATA:  52 year old male with history of headache.  Stroke. EXAM: CHEST  2 VIEW COMPARISON:  Chest x-ray 12/29/2015. FINDINGS: Lung volumes are normal. No consolidative airspace disease. No pleural effusions. No pneumothorax. No pulmonary nodule or mass noted. Pulmonary vasculature and the cardiomediastinal silhouette are within normal limits. IMPRESSION: No radiographic evidence of acute cardiopulmonary disease. Electronically Signed   By: Vinnie Langton M.D.   On: 02/20/2017 10:23   Ct Head Wo Contrast  Result Date: 02/22/2017 CLINICAL DATA:  52 y/o  M; acute stroke for follow-up. EXAM: CT HEAD WITHOUT CONTRAST TECHNIQUE: Contiguous axial images were obtained from the base of the skull through the vertex without intravenous contrast. COMPARISON:  02/21/2017 CT head.  02/19/2017 MRI head. FINDINGS: Brain: Stable distribution of right PICA infarction. Stable edema and associated mass effect with right to left midline shift in the posterior fossa, mass effect on right posterior medulla, partial effacement of quadrigeminal plate cistern, and partial effacement of the fourth ventricle. Stable size of the lateral and third ventricles. No new large acute stroke, hemorrhage, or additional focus of mass effect identified. Stable incidental empty sella turcica. Vascular: No hyperdense vessel or unexpected calcification. Skull: Normal. Negative for fracture or focal lesion. Sinuses/Orbits: No acute finding. Other: None. IMPRESSION: 1. Stable distribution  of right PICA infarction. Stable associated mass effect with partial effacement of fourth ventricle and quadrigeminal plate cistern. Stable ventricle size with  mild hydrocephalus. 2. No new acute intracranial abnormality identified. Electronically Signed   By: Kristine Garbe M.D.   On: 02/22/2017 03:01   Ct Head Wo Contrast  Result Date: 02/21/2017 CLINICAL DATA:  Follow-up code stroke. History of RIGHT vertebral artery dissection, hypertension, diabetes and stroke. EXAM: CT HEAD WITHOUT CONTRAST TECHNIQUE: Contiguous axial images were obtained from the base of the skull through the vertex without intravenous contrast. COMPARISON:  MRI of the head February 19, 2017 FINDINGS: BRAIN: Evolving RIGHT inferior cerebellar infarct with cytotoxic edema, regional mass effect and mild midline shift. The obex appears effaced, effaced foramen of Luschka. Early obstructive hydrocephalus (diameter of bifrontal horns was 3.8 cm, now 4.5 cm with slightly enlarged temporal horns). No frank interstitial edema/transependymal flow cerebral spinal fluid. Old RIGHT corona radiata/internal capsule lacunar infarct. No intraparenchymal hemorrhage. No abnormal extra-axial fluid collections. Effaced RIGHT quadrigeminal cistern. Empty sella. VASCULAR: Unremarkable. SKULL: No skull fracture. No significant scalp soft tissue swelling. SINUSES/ORBITS: The mastoid air-cells and included paranasal sinuses are well-aerated.The included ocular globes and orbital contents are non-suspicious. OTHER: None. IMPRESSION: 1. Evolving RIGHT posterior-inferior cerebellar artery territory infarct with increasing edema and mass effect, no hemorrhagic conversion. 2. New mild obstructive hydrocephalus. Acute findings text paged to [place], Neurology via [employer] secure system on 02/21/2017 at 10:43 pm, including interpreting physician's phone number. Electronically Signed   By: Elon Alas M.D.   On: 02/21/2017 22:45   [name] Contrast  Result Date: 02/20/2017 CLINICAL DATA:  52 year old male with abnormal right vertebral artery on CTA yesterday following presentation with neck pain,  nausea vomiting most compatible with right vertebral artery dissection. Brain MRI yesterday revealing right PICA territory infarct. EXAM: MRA NECK WITHOUT AND WITH CONTRAST TECHNIQUE: Multiplanar and multiecho pulse sequences of the neck were obtained without and with intravenous contrast. Angiographic images of the neck were obtained using MRA technique without and with intravenous contrast. CONTRAST:  38m MULTIHANCE GADOBENATE DIMEGLUMINE 529 MG/ML IV SOLN COMPARISON:  Brain MRI and CTA head and neck 02/20/2016. FINDINGS: Precontrast time-of-flight images reveal lost antegrade flow signal throughout the right vertebral artery in the neck and at the skull base. Preserved antegrade flow signal in the left vertebral artery and both carotid arteries throughout the neck. Precontrast axial T1 spin echo image with fat saturation demonstrates flow voids in both common carotid arteries and the left vertebral artery, but absent flow void in the proximal right vertebral artery, and asymmetric intent intrinsic T1 hyperintensity within the lumen of the distal right V2 segment in the upper neck (series 5, image 13). Post-contrast neck MRA images reveal a 3 vessel arch configuration with normal great vessel origins. There is a small triangular shaped area of abrupt termination of enhancement in the right vertebral artery origin similar to the CTA appearance yesterday (series 10701, image 35). There is no enhancement in of the right vertebral artery V1 segment, and poor irregular enhancement throughout the proximal and mid right vertebral artery V2 segment. Enhancement of the vessel appears more normal in the distal V2 segment, while the V3 and V4 segments appear to be normally enhancing aside from mild vessel irregularity. The contralateral left vertebral artery is within normal limits. The bilateral cervical carotid arteries appear normal. IMPRESSION: 1. Abnormal MRA appearance of the right vertebral artery compatible with  proximal vertebral artery dissection and thrombosis. The vessel appears  to be reconstituted in the distal V2 segment. 2. Negative MRI appearance of the left vertebral artery and bilateral cervical carotid arteries. Electronically Signed   By: Genevie Ann M.D.   On: 02/20/2017 12:54    ROS: As above Blood pressure (!) 162/94, pulse 72, temperature 98.6 F (37 C), temperature source Oral, resp. rate 13, height _0  (1.778 m), weight 76.4 kg (168 lb 6.9 oz), SpO2 98 %. Estimated body mass index is 24.17 kg/m as calculated from the following:   Height as of this encounter: _1  (1.778 m).   Weight as of this encounter: 76.4 kg (168 lb 6.9 oz).  Physical Exam  General: A 52 year old alert and pleasant white male in no apparent distress.  HEENT: Normocephalic his pupils are equal, extraocular muscles are intact.  Neck: Supple  Thorax: Symmetric  Abdomen: Soft  Extremities: Unremarkable  Back exam: Unremarkable  Neurologic exam: The patient is alert and oriented x3.  Glasgow Coma Scale 15.  Cranial nerves II through XII were examined bilaterally grossly normal.  His vision and normal grossly normal bilaterally.  His motor strength is 5/5 in spinal bicep, handgrip, quadriceps, gastrocnemius and dorsiflexors.  Cerebellar function is intact to rapid alternating movements and finger to nose bilaterally.  Sensory function is intact to light touch sensation all tested dermatomes bilaterally.  I have reviewed the patient's head CTs performed last night.  They demonstrate patient has minimal ventricular enlargement.  He has a evolving right PICA infarct.  The fourth ventricle is patent.  He has old right periventricular lacunar infarct  Assessment/Plan: Right PICA infarct, minimal ventricular enlargement: The patient is doing well clinically he does not need a ventriculostomy presently.  Ophelia Charter 02/22/2017, 7:33 AM

## 2017-02-22 NOTE — Significant Event (Addendum)
Rapid Response Event Note  Called by RN to inform me that patient will need to be transferred to NTICU for Sodium Chloride 3% (Hypertonic) infusion. Last sodium was 135 at 2134, and RN was able to draw Na level at 0045.   I reviewed the order and infusion was started at 0045 5150mL/hr (RATE). I reviewed this with RPh as well. 3% infusion infusing in 18 LAC PIV.   Primary RN was able to start a 2nd PIV. SBP maintaining, Goal SBP < 220 and DBP < 120, currently maintaining. Neuro exam unchanged, other VSS.   Will transfer patient to NTICU as soon as bed is available.   At 200, hypertension was SBP 211/167, RN called Neuro MD on call, updated MD, orders received for STAT HEAD CT and Vasotec1.25mg . Neuro exam is has not changed, NIH still 0.   Patient taken to CT Head and then transferred to 4N31.   Demere Dotzler R   End Time 0300

## 2017-02-22 NOTE — Progress Notes (Signed)
PROGRESS NOTE    Kenneth [name]  ZOX:[id number] DOB: [birth year] DOA: 02/20/2017 PCP: Anola Gurney, PA   Brief Narrative:  Kenneth [name] is a 52 y.o. male with a Past Medical History of DM, HTN, CAD, and CVA who presents with acute CVA diagnosed at [facility].  He was transferred from [place] to [facility] overnight on the Neurology service for diagnostic cerebral angiogram. TRH was consulted and assumed care. He was found to have an acute CVA and a Vertebral Artery Dissection. Workup is currently underway and Neurology is following. A Stat Head CT was obtained last night and showed worsening mass effect mild hydrocephalus obstructive and showed evolving Right PICA Territory Infarct. Because of Concern Neurology transferred patient to ICU and started 3% NS and Neurosurgery was consulted for evaluation.   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 -CT Angio Head and Neck showed The right vertebral artery is abnormal throughout its course. 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 Unfortunately the distal right vertebral artery is dominant. The Left vertebral is normal but diminutive beyond the  left PICA. Still the vertebrobasilar junction, basilar artery, and other posterior circulation remain patent. Stable CT appearance of the brain since 1156 hours today with no acute infarct evident. . Minimal carotid atherosclerosis, no carotid stenosis, and otherwise negative anterior circulation. -MRI Brain showed Acute right pica territory infarct with known underlying right vertebral dissection. No hydrocephalus. Small remote right corona radiata infarct. -MRA Neck showed Abnormal MRA appearance of the right vertebral artery compatible with proximal vertebral artery dissection and thrombosis. The vessel appears to be reconstituted in the distal V2 segment. Negative MRI appearance of the left vertebral artery and bilateral cervical carotid arteries. -ECHOCardiogram showed EF of 35-40% with Grade 2 DD and ? Apical Thrombus -TEE Cancelled after my Discussion with Cardiology Dr. Royann Shivers and he recommended Limited ECHO with Contrast -Repeat Limited ECHO showed LVEF of 45-50% and no Mural Thrombus -Head CT w/o Contrast x2 was repeated last night because of Worsening Headache;   -First Head CT showed Evolving RIGHT posterior-inferior cerebellar artery territory infarct with increasing edema and mass effect, no hemorrhagic conversion. New mild obstructive hydrocephalus -Second Head CT showed Stable distribution of right PICA infarction. Stable associated mass effect with partial effacement of fourth ventricle and quadrigeminal plate cistern. Stable ventricle size with mild hydrocephalus. No new acute intracranial abnormality identified. -Because of Worsening Mass effect with new mild Hydrocephalus patient was at risk for developing Herniation so was moved to ICU and started on Hypertonic NS 3% at 50 mL/hr -Neurosurgery Dr. Lovell Sheehan was consulted and felt that he did not need a Ventriculostomy presently -PCCM placed Triple Lumen Catheter  -Patient's Home BP medication with Lisinopril Started by Neurology    -Lipid Panel as below -UDS Positive  for Barbiturates, Benzodiazepines, Opiates,  -TSH was 0.765 -C/w ASA and Plavix daily for DAPT at least 3 months.  Of note, patient was not taking ASA regularly and so this does not constitute primary ASA failure.  He did start ASA just prior and this can reportedly precipitate events. Regardless, will request CM assistance for Plavix but generic Clopidogrel is available from [facility] for $9 for 30 days and $24 for 90 days. -Neurochecks per Protocol  -PT/OT/SLP/Nutrition Consults; Recommending Home Health PT with Rolling Walker with 5" Wheels  Cerebral Edema/Mild Obstructive Hydrocephalus -As Above -Started on 3% NS and Triple Lumen Placed -Neurosurgery consulted and no Ventriculostomy to be placed at this time.  -Per Neuro STAT Head If Neuro Exam Worsens   Right Vertebral Artery Dissection -As Above -C/w ASA 325 mg po Daily, Plavix 75 mg po Daily, and Atorvastatin 80 mg po qHS -Neuro repeated Head CT and findings as above -C/w Telemetry Monitoring -Neurology started 3% NS at 50 mL/hr  Acute Headache -Improving -C/w Acetaminophen and Fioricet PRN  HTN -Allow permissive HTN but Goal changed given instability overnight  -Treat BP only if >180/120, and then with goal of 15% reduction  -Lisinopril Restarted  -Patient has not been taking any medications for BP.   -Lisinopril and Metoprolol are both available on the $4 [facility] list and would be excellent options, but there are a multitude of other medications also available on the list at [facility]. -Continue To Monitor closely   HLD -Lipid Panel showed Cholesterol of 141, HDL of 27, LDL of 63, TG of 253, and VLDL 51 -Patient now on Atorvastatin 80 mg po Daily  -Review of Home Medication list shows patient is supposed to be on Gemfibrozil 600 mg po BID  -Generic Atorvastatin 40 mg is available on the [facility] list for $9 for 30 tablets or $24 for 90 tablets and this would clearly be his best option  but will need to check 80 mg cost  Diabetes Mellitus Type 2  -Patient's A1c is 7.7 indicating at least some compliance with medications and/or dietary strategies (at least compared to prior).  -He was previously prescribed Glucophage 1000 mg BID, and this medication is available for $4/month or $10/3 months at [facility]. -Review of Home Medication list also shows patient was supposed to be on Glimepiride 4 mg po daily but not taking -C/w Senistive Novolog SSI AC -CBG's ranging from 148-164  Tobacco Abuse/Dependence -Smoking Cessation Counseling given. This was discussed with the patient and should be reviewed on an ongoing basis.   -Ordered Nicotine 14 mg TD Patch .  GERD  -Ordered Protonix IV BID on admission and will change to po Protonix 40 mg BID today  -Omeprazole 20 or 40 mg is available on the [facility] list for $9 for 30 capsules or $24 for 90 capsules.  CAD -Hx of Stenting -ASA 325 mg po Daily, Clopidogrel 75 mg po daily, and C/w Morphine 2 mg q2hprn -No BB or ACE currently to allow for Permissive HTN -ECHOCardiogram done and showed EF of 35-40% and Grade 2 DD with ? Apical Thrombus  -Repeat Limited ECHO showes no Thrombus -Will need Outpatient Cardiology Follow up   Leukocytosis -Improving as WBC went from 21.2 -> 10.1 -> 11.9 -Continue to Monitor for S/Sx of Infection -Repeat CBC in AM   Erythrocytosis -Likely in the setting of Tobacco Abuse -Continue to Monitor  DVT prophylaxis: Heparin 5,000 units sq q8h Code Status: FULL CODE Family Communication: Discussed with Wife at Bedside Disposition Plan:  D/C Home with Home Health PT/OT  Consultants:   Neurology Stroke Team  Neurosurgery    Procedures: ECHOCARDIOGRAM ------------------------------------------------------------------- Study Conclusions  - Left ventricle: The cavity size was normal. Systolic function was   moderately reduced. The estimated ejection fraction was in the   range of 35% to 40%.  Images were inadequate for LV wall motion   assessment and cannot visualize the apex adequately. Features are   consistent with a pseudonormal left ventricular filling pattern,   with concomitant abnormal relaxation and increased filling   pressure (grade 2 diastolic dysfunction). Doppler parameters are   consistent with high ventricular filling pressure. - Aortic valve: Trileaflet; normal thickness, mildly calcified   leaflets. - Mitral valve: There was trivial regurgitation. - Left atrium: The atrium was mildly dilated. - Pulmonary arteries: PA peak pressure: 34 mm Hg (S). - Pericardium, extracardiac: A trivial pericardial effusion was   identified posterior to the heart. - Recommendations: Limited study with definity contrast to assess   wall motion and rule out apical thrombus.  Recommendations:  Limited study with definity contrast to assess wall motion and rule out apical thrombus.  LIMITED ECHOCARDIOGRAM 02/22/17 ------------------------------------------------------------------- Study Conclusions  - Technical notes: Definity to assess LV. - Left ventricle: Systolic function was mildly reduced. The   estimated ejection fraction was in the range of 45% to 50%.  Impressions:  - LVEF 45-50% - no mural thrombus.   Antimicrobials:  Anti-infectives (From admission, onward)   None     Subjective: Patient was seen and examined and was withdrawn and wanted to know when he could be discharged. No CP or SOB but complained of a Headache still. No nausea or vomiting. No other complaints or concerns.   Objective: Vitals:   02/22/17 1815 02/22/17 1830 02/22/17 1900 02/22/17 1930  BP: (!) 213/99 (!) 178/93 (!) 163/78 (!) 151/84  Pulse: 64 72 74 70  Resp: 20 (!) 23 20 16   Temp:      TempSrc:      SpO2: 100% 99% 100% 99%  Weight:      Height:        Intake/Output Summary (Last 24 hours) at 02/22/2017 2035 Last data filed at 02/22/2017 1800 Gross per 24 hour  Intake 2400 ml   Output 750 ml  Net 1650 ml   Filed Weights   02/20/17 0501  Weight: 76.4 kg (168 lb 6.9 oz)   Examination: Physical Exam:  Constitutional: WN/WD Caucasian Male in NAD Eyes: Sclerae anicteric. Lids normal ENMT: External Ears and nose appear normal Neck: Appears supple with no JVD Respiratory: Slightly diminished but no appreciable wheezing/rales/rhonchi Cardiovascular: RRR; No m/r/g. No extremity edema Abdomen: Soft, NT, ND. Bowel sounds present GU: Deferred Musculoskeletal: No contractures; no cyanosis Skin: Warm and Dry; No rashes or lesions on a limited skin eval Neurologic: CN 2-12 grossly intact. No appreciable focal deficits Psychiatric: Normal mood and affect. Intact judgement and insight  Data Reviewed: I have personally reviewed following labs and imaging studies  CBC: Recent Labs  Lab 02/19/17 1118 02/20/17 0841 02/21/17 0918 02/22/17 0420  WBC 10.1 21.2* 10.1 11.9*  NEUTROABS 6.1  --  6.9 9.2*  HGB 17.8 17.0 15.8 17.5*  HCT 52.6* 48.4 46.9 49.8  MCV 85.9 86.3 89.0 85.9  PLT 310 271 223 236   Basic Metabolic Panel: Recent Labs  Lab 02/19/17 1118 02/20/17 0841  02/21/17 0340  02/21/17 2134 02/22/17 0039 02/22/17 0420 02/22/17 1339 02/22/17 1801  NA 134* 139   < > 139   < >  135 137 139 138 143  K 3.7  --   --  3.8  --   --   --  3.3*  --   --   CL 102  --   --  107  --   --   --  102  --   --   CO2 22  --   --  23  --   --   --  25  --   --   GLUCOSE 199*  --   --  145*  --   --   --  134*  --   --   BUN 17  --   --  10  --   --   --  9  --   --   CREATININE 1.06 0.97  --  0.93  --   --   --  0.84  --   --   CALCIUM 9.1  --   --  8.5*  --   --   --  9.1  --   --   MG 1.9  --   --   --   --   --   --  2.0  --   --   PHOS  --   --   --   --   --   --   --  3.2  --   --    < > = values in this interval not displayed.   GFR: Estimated Creatinine Clearance: 106.2 mL/min (by C-G formula based on SCr of 0.84 mg/dL). Liver Function Tests: Recent Labs   Lab 02/19/17 1118 02/22/17 0420  AST 21 19  ALT 12* 12*  ALKPHOS 80 81  BILITOT 0.7 1.1  PROT 7.5 6.9  ALBUMIN 4.1 3.8   Recent Labs  Lab 02/19/17 1118  LIPASE 50   No results for input(s): AMMONIA in the last 168 hours. Coagulation Profile: No results for input(s): INR, PROTIME in the last 168 hours. Cardiac Enzymes: Recent Labs  Lab 02/19/17 1118 02/19/17 1957  TROPONINI 0.04* 0.05*   BNP (last 3 results) No results for input(s): PROBNP in the last 8760 hours. HbA1C: No results for input(s): HGBA1C in the last 72 hours. CBG: Recent Labs  Lab 02/21/17 1637 02/21/17 2052 02/22/17 0817 02/22/17 1152 02/22/17 1639  GLUCAP 148* 169* 144* 287* 244*   Lipid Profile: Recent Labs    02/21/17 0340  CHOL 141  HDL 27*  LDLCALC 63  TRIG 147253*  CHOLHDL 5.2   Thyroid Function Tests: Recent Labs    02/20/17 1527  TSH 0.765   Anemia Panel: No results for input(s): VITAMINB12, FOLATE, FERRITIN, TIBC, IRON, RETICCTPCT in the last 72 hours. Sepsis Labs: Recent Labs  Lab 02/19/17 0059 02/19/17 1118 02/19/17 1957  LATICACIDVEN 1.7 2.7* 0.9    Recent Results (from the past 240 hour(s))  MRSA PCR Screening     Status: None   Collection Time: 02/20/17  5:30 AM  Result Value Ref Range Status   MRSA by PCR NEGATIVE NEGATIVE Final    Comment:        The GeneXpert MRSA Assay (FDA approved for NASAL specimens only), is one component of a comprehensive MRSA colonization surveillance program. It is not intended to diagnose MRSA infection nor to guide or monitor treatment for MRSA infections.          Radiology Studies: Ct Head Wo Contrast  Result Date: 02/22/2017 CLINICAL DATA:  52 y/o  M; acute  stroke for follow-up. EXAM: CT HEAD WITHOUT CONTRAST TECHNIQUE: Contiguous axial images were obtained from the base of the skull through the vertex without intravenous contrast. COMPARISON:  02/21/2017 CT head.  02/19/2017 MRI head. FINDINGS: Brain: Stable  distribution of right PICA infarction. Stable edema and associated mass effect with right to left midline shift in the posterior fossa, mass effect on right posterior medulla, partial effacement of quadrigeminal plate cistern, and partial effacement of the fourth ventricle. Stable size of the lateral and third ventricles. No new large acute stroke, hemorrhage, or additional focus of mass effect identified. Stable incidental empty sella turcica. Vascular: No hyperdense vessel or unexpected calcification. Skull: Normal. Negative for fracture or focal lesion. Sinuses/Orbits: No acute finding. Other: None. IMPRESSION: 1. Stable distribution of right PICA infarction. Stable associated mass effect with partial effacement of fourth ventricle and quadrigeminal plate cistern. Stable ventricle size with mild hydrocephalus. 2. No new acute intracranial abnormality identified. Electronically Signed   By: Mitzi Hansen M.D.   On: 02/22/2017 03:01   Ct Head Wo Contrast  Result Date: 02/21/2017 CLINICAL DATA:  Follow-up code stroke. History of RIGHT vertebral artery dissection, hypertension, diabetes and stroke. EXAM: CT HEAD WITHOUT CONTRAST TECHNIQUE: Contiguous axial images were obtained from the base of the skull through the vertex without intravenous contrast. COMPARISON:  MRI of the head February 19, 2017 FINDINGS: BRAIN: Evolving RIGHT inferior cerebellar infarct with cytotoxic edema, regional mass effect and mild midline shift. The obex appears effaced, effaced foramen of Luschka. Early obstructive hydrocephalus (diameter of bifrontal horns was 3.8 cm, now 4.5 cm with slightly enlarged temporal horns). No frank interstitial edema/transependymal flow cerebral spinal fluid. Old RIGHT corona radiata/internal capsule lacunar infarct. No intraparenchymal hemorrhage. No abnormal extra-axial fluid collections. Effaced RIGHT quadrigeminal cistern. Empty sella. VASCULAR: Unremarkable. SKULL: No skull fracture. No  significant scalp soft tissue swelling. SINUSES/ORBITS: The mastoid air-cells and included paranasal sinuses are well-aerated.The included ocular globes and orbital contents are non-suspicious. OTHER: None. IMPRESSION: 1. Evolving RIGHT posterior-inferior cerebellar artery territory infarct with increasing edema and mass effect, no hemorrhagic conversion. 2. New mild obstructive hydrocephalus. Acute findings text paged to Dr.Lindzen, Neurology via AMION secure system on 02/21/2017 at 10:43 pm, including interpreting physician's phone number. Electronically Signed   By: Awilda Metro M.D.   On: 02/21/2017 22:45   Dg Chest Port 1 View  Result Date: 02/22/2017 CLINICAL DATA:  Central line placement EXAM: PORTABLE CHEST 1 VIEW COMPARISON:  02/20/2017 FINDINGS: 1527 hours. Left IJ central line is new in the interval with tip overlying the SVC/RA junction. No evidence for left-sided pneumothorax. Lungs clear. The cardiopericardial silhouette is within normal limits for size. The visualized bony structures of the thorax are intact. Telemetry leads overlie the chest. IMPRESSION: Left IJ central line tip overlies the SVC/RA junction. No evidence for pneumothorax. Electronically Signed   By: Kennith Center M.D.   On: 02/22/2017 15:35   Scheduled Meds: .  stroke: mapping our early stages of recovery book   Does not apply Once  . aspirin EC  325 mg Oral Daily  . atorvastatin  80 mg Oral q1800  . [START ON 02/23/2017] Chlorhexidine Gluconate Cloth  6 each Topical Q0600  . clopidogrel  75 mg Oral Daily  . heparin  5,000 Units Subcutaneous Q8H  . insulin aspart  0-9 Units Subcutaneous TID WC  . lisinopril  10 mg Oral BID  . pantoprazole  40 mg Oral BID  . polyethylene glycol  17 g Oral Daily  .  potassium chloride  40 mEq Oral BID  . senna-docusate  1 tablet Oral QHS  . sodium chloride flush  10-40 mL Intracatheter Q12H   Continuous Infusions: . sodium chloride (hypertonic) 50 mL/hr (02/22/17 2021)    LOS: 2  days   Merlene Laughter, DO [facility] Pager [phone]  If 7PM-7AM, please contact night-coverage [email or web address] Password [facility] 02/22/2017, 8:35 PM

## 2017-02-22 NOTE — Progress Notes (Signed)
  Echocardiogram 2D Echocardiogram has been performed.  Kenneth [name] 02/22/2017, 10:33 AM

## 2017-02-22 NOTE — Progress Notes (Signed)
Repeat CT head at 0247 unchanged. Continue to monitor.   Electronically signed: Dr. Caryl PinaEric Terena Bohan

## 2017-02-22 NOTE — Progress Notes (Signed)
Physical Therapy Treatment Patient Details Name: Kenneth [name] MRN: [id number] DOB: 1965-07-10 Today's Date: 02/22/2017    History of Present Illness Pt is a 52 y.o. male presenting with intractable nausea and vomiting and gait imbalance with neck pain x 1 week. 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  PMH includes HTN, DM, CAD.     PT Comments    Pt moving well and pleasant but reports dizziness with activity, no nystagmus. Pt with decreased balance and safety awareness who tends to be impulsive and is somewhat disregarding of deficits as no big deal and expects to return to work this week. Pt educated for higher level balance deficits, need for assist with gait and mobility and need for continued activity acutely. Will follow.   BP supine 170/97 Sitting 151/87 In chair after gait 169/99    Follow Up Recommendations  Home health PT;Supervision/Assistance - 24 hour     Equipment Recommendations  Rolling walker with 5" wheels    Recommendations for Other Services       Precautions / Restrictions Precautions Precautions: Fall    Mobility  Bed Mobility Overal bed mobility: Independent                Transfers Overall transfer level: Needs assistance   Transfers: Sit to/from Stand Sit to Stand: Min guard         General transfer comment: minguard for lines and safety  Ambulation/Gait Ambulation/Gait assistance: Min guard Ambulation Distance (Feet): 150 Feet Assistive device: (pt held IV pole, denied RW use) Gait Pattern/deviations: Step-through pattern;Decreased stride length   Gait velocity interpretation: Below normal speed for age/gender General Gait Details: pt with 3 partial LOB with guarding and tactile cues to correct, reliant on IV pole for support. Limited by fatigue   Stairs            Wheelchair Mobility    Modified Rankin (Stroke Patients Only) Modified Rankin (Stroke Patients Only) Pre-Morbid Rankin Score: No  symptoms Modified Rankin: Moderate disability     Balance Overall balance assessment: Needs assistance   Sitting balance-Leahy Scale: Good       Standing balance-Leahy Scale: Fair       Tandem Stance - Right Leg: 20 Tandem Stance - Left Leg: 5 Rhomberg - Eyes Opened: 60 Rhomberg - Eyes Closed: 60   High Level Balance Comments: pt able to stand with and without eyes closed without assist but difficulty with left leg tandem stance. Unable to challenge balance further as pt fatigued and denied             Cognition Arousal/Alertness: Awake/alert Behavior During Therapy: Impulsive Overall Cognitive Status: Impaired/Different from baseline                         Following Commands: Follows one step commands consistently Safety/Judgement: Decreased awareness of safety            Exercises      General Comments        Pertinent Vitals/Pain Pain Score: 4  Pain Location: headache/neck Pain Descriptors / Indicators: Headache Pain Intervention(s): Limited activity within patient's tolerance;Repositioned;Monitored during session    Home Living                      Prior Function            PT Goals (current goals can now be found in the care plan section) Progress towards PT goals: Progressing  toward goals    Frequency    Min 4X/week      PT Plan Current plan remains appropriate    Co-evaluation              AM-PAC PT "6 Clicks" Daily Activity  Outcome Measure  Difficulty turning over in bed (including adjusting bedclothes, sheets and blankets)?: None Difficulty moving from lying on back to sitting on the side of the bed? : None Difficulty sitting down on and standing up from a chair with arms (e.g., wheelchair, bedside commode, etc,.)?: None Help needed moving to and from a bed to chair (including a wheelchair)?: A Little Help needed walking in hospital room?: A Little Help needed climbing 3-5 steps with a railing? : A  Little 6 Click Score: 21    End of Session Equipment Utilized During Treatment: Gait belt Activity Tolerance: Patient limited by fatigue Patient left: with call bell/phone within reach;with bed alarm set;with family/visitor present;in chair;with chair alarm set Nurse Communication: Mobility status PT Visit Diagnosis: Other abnormalities of gait and mobility (R26.89);Unsteadiness on feet (R26.81)     Time: [id number] PT Time Calculation (min) (ACUTE ONLY): 25 min  Charges:  $Gait Training: 8-22 mins $Neuromuscular Re-education: 8-22 mins                    G Codes:       [name], PT [phone]    [name] 02/22/2017, 11:36 AM

## 2017-02-22 NOTE — Progress Notes (Signed)
Called for SBP to low 220s, which is the highest it has been. Pain medication for headache brought SBP down to 202.   Discussed with patient his symptoms and he states that he feels "100%" but speech mildly dysarthric and question that his insight is mildly impaired.   A/R: 1. Possible initial stages of a Cushing response in the setting of developing hydrocephalus, with BP trending upwards. No bradycardia or respiratory changes.  2. Given possible Cushing response and question of mildly impaired mentation with mild dysarthria (also may be due to effects of pain medication and lack of sleep), will obtain repeat head CT STAT to assess for possible change relative to the CT head obtained at 9:30 PM. Will call Neurosurgery if there is a change.   Electronically signed: Dr. Caryl PinaEric Raegyn Renda

## 2017-02-22 NOTE — Progress Notes (Signed)
SLP Cancellation Note  Patient Details Name: Jonell CluckJimmy R Joubert MRN: [id number] DOB: 06/08/1965   Cancelled treatment:       Reason Eval/Treat Not Completed: Patient at procedure or test/unavailable   Blenda MountsCouture, Kaina Orengo Laurice 02/22/2017, 3:47 PM

## 2017-02-22 NOTE — Procedures (Signed)
Name:  Kenneth CluckJimmy R [name] MRN:  [id number] DOB:  01/06/1966  PROCEDURE NOTE  Procedure:  Central venous catheter placement.  Indications:  Need for intravenous access and hemodynamic monitoring.  For hypertonic saline for midline shift after stroke I was asked by the neurological service to place this line.  Family consented to the procedure.  Consent:  Consent was implied due to the emergency nature of the procedure.  Anesthesia:  A total of 10 mL of 1% Lidocaine was used for local infiltration anesthesia.  Procedure summary:  Appropriate equipment was assembled.  The patient was identified as Production managerJimmy R [name] and safety timeout was performed. The patient was placed in Trendelenburg position.  Sterile technique was used. The patient's left anterior chest wall was prepped using chlorhexidine / alcohol scrub and the field was draped in usual sterile fashion with full body drape. After the adequate anesthesia was achieved, the Left IJ vein was cannulated with the introducer needle without difficulty. A guide wire was advanced through the introducer needle, which was then withdrawn. A small skin incision was made at the point of wire entry, the dilator was inserted over the guide wire and appropriate dilation was obtained. The dilator was removed and  triple-lumen catheter was advanced over the guide wire, which was then removed.  All ports were aspirated and flushed with normal saline without difficulty. The catheter was secured into place at 18 cm. Antibiotic patch was placed and sterile dressing was applied. Post-procedure chest x-ray was ordered.  Complications:  No immediate complications were noted.  Hemodynamic parameters and oxygenation remained stable throughout the procedure.  Estimated blood loss:  Less then 5 mL.  Kenneth RoupAhmad M Sondos Wolfman, MD Pulmonary and Critical Care Medicine [facility] Cell: [phone]  02/22/2017, 3:12 PM

## 2017-02-22 NOTE — Progress Notes (Signed)
OT Cancellation Note  Patient Details Name: Kenneth [name] MRN: [id number] DOB: 12/17/1965   Cancelled Treatment:    Reason Eval/Treat Not Completed: Patient at procedure or test/ unavailable.    [name], [name] [phone]   [name], [name] 02/22/2017, 5:03 PM

## 2017-02-22 NOTE — Progress Notes (Signed)
NEUROHOSPITALISTS STROKE TEAM - DAILY PROGRESS NOTE   SUBJECTIVE (INTERVAL HISTORY) No family at the bedside. Patient repeat CT last night showed mild hydrocephalus. Put on 3% saline. NSG consulted and recommended continue observation. Pt HA improved with 3% saline, no more N/V. Need central line for continued 3% saline.     OBJECTIVE Lab Results: CBC:  Recent Labs  Lab 02/20/17 0841 02/21/17 0918 02/22/17 0420  WBC 21.2* 10.1 11.9*  HGB 17.0 15.8 17.5*  HCT 48.4 46.9 49.8  MCV 86.3 89.0 85.9  PLT 271 223 236   BMP: Recent Labs  Lab 02/19/17 1118 02/20/17 0841  02/21/17 0340 02/21/17 0918 02/21/17 1553 02/21/17 2134 02/22/17 0039 02/22/17 0420  NA 134* 139   < > 139 140 139 135 137 139  K 3.7  --   --  3.8  --   --   --   --  3.3*  CL 102  --   --  107  --   --   --   --  102  CO2 22  --   --  23  --   --   --   --  25  GLUCOSE 199*  --   --  145*  --   --   --   --  134*  BUN 17  --   --  10  --   --   --   --  9  CREATININE 1.06 0.97  --  0.93  --   --   --   --  0.84  CALCIUM 9.1  --   --  8.5*  --   --   --   --  9.1  MG 1.9  --   --   --   --   --   --   --  2.0  PHOS  --   --   --   --   --   --   --   --  3.2   < > = values in this interval not displayed.   Liver Function Tests:  Recent Labs  Lab 02/19/17 1118 02/22/17 0420  AST 21 19  ALT 12* 12*  ALKPHOS 80 81  BILITOT 0.7 1.1  PROT 7.5 6.9  ALBUMIN 4.1 3.8   Recent Labs  Lab 02/19/17 1118  LIPASE 50   Thyroid Function Studies:  Recent Labs    02/20/17 1527  TSH 0.765   Cardiac Enzymes:  Recent Labs  Lab 02/19/17 1118 02/19/17 1957  TROPONINI 0.04* 0.05*   Urine Drug Screen:     Component Value Date/Time   LABOPIA POSITIVE (A) 02/20/2017 1600   COCAINSCRNUR NONE DETECTED 02/20/2017 1600   COCAINSCRNUR NONE DETECTED 12/29/2015 0823   LABBENZ POSITIVE (A) 02/20/2017 1600   AMPHETMU NONE DETECTED 02/20/2017 1600   THCU NONE  DETECTED 02/20/2017 1600   LABBARB POSITIVE (A) 02/20/2017 1600    PHYSICAL EXAM Temp:  [96.9 F (36.1 C)-98.6 F (37 C)] 96.9 F (36.1 C) (02/01 1200) Pulse Rate:  [43-93] 92 (02/01 1300) Resp:  [8-26] 20 (02/01 1300) BP: (138-205)/(72-131) 156/112 (02/01 1300) SpO2:  [94 %-100 %] 97 % (02/01 1300) General - Well nourished, well developed, in no apparent distress HEENT-  Normocephalic, Cardiovascular - Regular rate and rhythm  Respiratory - Lungs clear bilaterally. No wheezing. Abdomen - soft and non-tender, BS normal Extremities- no edema or cyanosis Neurological Examination Mental Status: Alert, oriented, thought content appropriate.  Speech fluent without evidence  of aphasia. Able to follow 3 step commands without difficulty. Cranial Nerves: II: Visual fields grossly normal,  III,IV, VI: ptosis not present, extra-ocular motions intact bilaterally, pupils equal, round, reactive to light and accommodation V,VII: smile symmetric, facial light touch sensation normal bilaterally VIII: hearing normal bilaterally IX,X: uvula rises symmetrically XI: bilateral shoulder shrug XII: midline tongue extension Motor: Right : Upper extremity   5/5    Left:     Upper extremity   5/5  Lower extremity   5/5     Lower extremity   5/5 Tone and bulk:normal tone throughout; no atrophy noted Sensory: Pinprick and light touch intact throughout, bilaterally Deep Tendon Reflexes: 2+ and symmetric throughout Plantars: Right: downgoing   Left: downgoing Cerebellar: normal finger-to-nose, normal rapid alternating movements and normal heel-to-shin test Gait: not tested  IMAGING: I have personally reviewed the radiological images below and agree with the radiology interpretations.  Mr Maxine Glenn Neck W Wo Contrast Result Date: 02/20/2017 IMPRESSION: 1. 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 The vessel appears to be  reconstituted in the distal V2 segment. 2. Negative MRI appearance of the left vertebral artery and bilateral cervical carotid arteries. Electronically Signed   By: Odessa Fleming M.D.   On: 02/20/2017 12:54   Mr Brain Wo Contrast  Result Date: 02/19/2017 IMPRESSION: 1. 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 2. No hydrocephalus. 3. Small remote right corona radiata infarct. Electronically Signed   By: Marnee Spring M.D.   On: 02/19/2017 15:51   Ct Angio Head/ Neck W Or Wo Contrast Result Date: 02/19/2017  IMPRESSION: 1. The right vertebral artery is abnormal throughout its course. 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 2. Unfortunately the distal right vertebral artery is dominant. The left vertebral is normal but diminutive beyond the left PICA. Still, the vertebrobasilar junction, basilar artery, and other posterior circulation remain patent. 3. Stable CT appearance of the brain since 1156 hours today with no acute infarct evident. 4. Minimal carotid atherosclerosis, no carotid stenosis, and otherwise negative anterior circulation. Electronically Signed   By: Odessa Fleming M.D.   On: 02/19/2017 13:51   Ct Head Wo Contrast Result Date: 02/19/2017 IMPRESSION: 1. No evidence of acute intracranial abnormality. 2. 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 Electronically Signed   By: Sebastian Ache M.D.   On: 02/19/2017 12:10   Echocardiogram:                                               Study Conclusions  - Left ventricle: The cavity size was normal. Systolic function was   moderately reduced. The estimated ejection fraction was in the   range of 35% to 40%. Images were inadequate for LV wall motion   assessment and cannot visualize the apex adequately. Features are   consistent with a pseudonormal left ventricular filling pattern,   with concomitant  abnormal relaxation and increased filling   pressure (grade 2 diastolic dysfunction). Doppler parameters are   consistent with high ventricular filling pressure. - Aortic valve: Trileaflet; normal thickness, mildly calcified   leaflets. - Mitral valve: There was trivial regurgitation. - Left atrium: The atrium was mildly dilated. - Pulmonary arteries: PA peak pressure: 34 mm Hg (  S). - Pericardium, extracardiac: A trivial pericardial effusion was   identified posterior to the heart. - Recommendations: Limited study with definity contrast to assess   wall motion and rule out apical thrombus.  Echocardiogram Limited:       02-22-2017, 12:29:44            Study Conclusions - Technical notes: Definity to assess LV. - Left ventricle: Systolic function was mildly reduced. The   estimated ejection fraction was in the range of 45% to 50%. Impressions:- LVEF 45-50% - no mural thrombus.  CT HEAD WITHOUT CONTRAST 02/21/2017 22:45 IMPRESSION: 1. Evolving RIGHT posterior-inferior cerebellar artery territory infarct with increasing edema and mass effect, no hemorrhagic conversion. 2. New mild obstructive hydrocephalus.  CT HEAD WITHOUT CONTRAST  02/22/2017 03:01 IMPRESSION: 1. Stable distribution of right PICA infarction. Stable associated mass effect with partial effacement of fourth ventricle and quadrigeminal plate cistern. Stable ventricle size with mild hydrocephalus. 2. No new acute intracranial abnormality identified.    IMPRESSION:  52 year old male with history of diabetes, hypertension, smoker admitted for dizziness, nausea and vomiting.  He admitted on and off neck pain for the last 1 week.  CT head no hemorrhage.  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  MRI showed right cerebellum infarct and remote right CR infarct.  EF 35-40%, not able to rule out LV thrombosis.  Will need TTE with  contrast versus TEE in a.m.  LDL 63, TG 253 and A1c 7.7.  His TG much improved from 1 year ago.  Currently on aspirin, Plavix and Lipitor.  Patient had a stroke 12/2015 with left upper extremity numbness, gait imbalance and left-sided coordination difficulty.  MRI showed right BG/CR infarct.  Carotid Doppler negative.  EF 30-35%.  He has been following with cardiology in [place] clinic.  TG 2631, A1c 9.5.  He is not compliant with medication at home.  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  Suspected Etiology: dissection vs athero vs cardioembolic Resultant Symptoms: Ataxia, Neck Pain, H/A, Nausea Stroke Risk Factors: diabetes mellitus, hyperlipidemia, hypertension and smoking Other Stroke Risk Factors: Advanced age, Hx stroke, CAD  Outstanding Stroke Work-up Studies:     TEE  02/21/2017: Neuro exam unchanged. Patient continues to complain of H/A, neck pain and nausea. Was able to ambulate OOB with PT today. Na slowly trending up. Will continue Na tabs for now. Repeat Head CT and TEE scheduled for AM. Will continue to monitor closely. Fioricet added for headache treatment  02/22/2017 ASSESSMENT:   Transferred to ICU overnight after Repeat Head CT last PM showed increasing edema/hydrocephalus. Hypertonic saline 3% started at 50 cc/hr. NS consulted- No need for surgical intervention at this time. Elevated SBP overnight into 220's. Repeat Head CT was uncharged at 0247. Repeat ECHO negative for thrombus. Home B/P medication restarted today. Able to ambulate with PT today. States H/A and Neck pain are less "intense" today.  PLAN  02/22/2017: Continue Aspirin/ Plavix/Statin Repeat Head CT STAT if Neuro exam acutely worsens Frequent neuro checks Telemetry monitoring PT/OT/SLP Consult PM & Rehab Consult Case Management /MSW Ongoing aggressive stroke risk factor management Patient counseled to be compliant with his antithrombotic medications Patient  counseled on Lifestyle modifications including, Diet, Exercise, and Stress Follow up with [facility] in 6 weeks  HX OF STROKES: Small remote right corona radiata infarct  Headache Continue Fioricet PRN  Cerebral Edema  Moderate size cerebellar stroke, Repeat CT shows Mild hydrocephalus,  NA 137 today, Levels every 6 hrs, salt tabs discontinued and 3% started overnight NS consulted and no need for surgery at this time, Appreciate assistance Central line placement by CCM - Appreciate assistanace Obtain STAT if Neuro exam acutely worsens  Leukocytosis  - WBC 11.9 Remains afebrile, CXR 1/30 Negative Will monitor trend, Will obtain U/A if continues to rise Repeat labs in AM  Hypokalemia  - K+ 3.3 Replacement in progress Repeat labs in AM  HYPERTENSION: Unstable, some elevated B/P's noted overnight Permissive hypertension - SBP Goal now less than 180 Labetalol PRN Long term BP goal normotensive. Home Meds: Lisinopril restarted 02/22/2017  HYPERLIPIDEMIA:    Component Value Date/Time   CHOL 141 02/21/2017 0340   CHOL 174 07/30/2014 0902   TRIG 253 (H) 02/21/2017 0340   HDL 27 (L) 02/21/2017 0340   HDL 31 (L) 07/30/2014 0902   CHOLHDL 5.2 02/21/2017 0340   VLDL 51 (H) 02/21/2017 0340   LDLCALC 63 02/21/2017 0340   LDLCALC 111 (H) 07/30/2014 0902  Home Meds:  Lipitor 80 mg LDL  goal < 70 Continued on Lipitor to 80 mg daily Continue statin at discharge  DIABETES: Lab Results  Component Value Date   HGBA1C 7.7 (H) 02/19/2017  HgbA1c goal < 7.0 Currently on: Novolog Continue CBG monitoring and SSI to maintain glucose 140-180 mg/dl DM education   TOBACCO ABUSE Current smoker Smoking cessation counseling provided Nicotine patch provided  Other Active Problems: Active Problems:   Stroke (cerebrum) (HCC)   Ischemic stroke (HCC)   Type 2 diabetes mellitus with hyperlipidemia (HCC)   Uncontrolled type 2 diabetes mellitus with hyperglycemia (HCC)    Tobacco dependence   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 Yes; Fluid consistency: Thin   FAMILY UPDATES:  family at bedside  TEAM UPDATES: Merlene LaughterSheikh, Omair Latif, DO   Prior Home Stroke Medications:  aspirin 81 mg daily  Discharge Stroke Meds:  Please discharge patient on aspirin 325 mg daily   Disposition: [facility] Hospital Therapy Recs:               PENDING Follow Up:  Follow-up Information     [facility]04[facility]2017.   Why:  9 am for hospital follow up Contact information: [address] [id number] [place]c [place] [id number] [phone]       Nilda RiggsMartin, Nancy Carolyn, NP. Schedule an appointment as soon as possible for a visit in 6 week(s).   Specialty:  Family Medicine Contact information: [address] [place] [id number] [phone]          Anola Gurneyhauvin, Robert, [place] -PCP Follow up in 1-2 weeks      Assessment & plan discussed with with attending physician and they are in agreement.    Beryl MeagerMary A Costello, ANP-C Stroke Neurology Team 02/22/2017 1:16 PM    ATTENDING NOTE: I reviewed above note and agree with the assessment and plan. I have made any additions or clarifications directly to the above note. Pt was seen and examined.   52 year old male with history of diabetes, hypertension, smoker admitted for dizziness, nausea and vomiting.  He admitted on and off neck pain for the last 1 week.  CT head no hemorrhage.  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  MRA neck  also favor right VA dissection and thrombosis.  MRI showed right cerebellum infarct and remote right CR infarct.  EF 35-40%, not able to rule out LV thrombosis. Had TTE with contrast showed EF 45-50% and no mural thrombus.  LDL 63, TG 253 and A1c 7.7.  His TG much improved from 1 year ago.   Currently on aspirin, Plavix and Lipitor.  Patient had a stroke 12/2015 with left upper extremity numbness, gait imbalance and left-sided coordination difficulty.  MRI showed right BG/CR infarct.  Carotid Doppler negative.  EF 30-35%.  He has been following with cardiology in [place] clinic.  TG 2631, A1c 9.5.  He is not compliant with medication at home.  Patient yesterday continued to complain of severe headache with nausea vomiting.  Repeat CT showed mild hydrocephalus. Put on 3% saline and transfer to ICU. NSG Dr. Lovell Sheehan contacted and no EVD at this time will continue monitoring. Pt HA improved and no more N/V. Will put in central line for continued 3% saline.  Marvel Plan, MD PhD Stroke Neurology 02/21/2017 7:18 PM  This patient is critically ill due to right cerebellar infarct, obstructive hydrocephalus, right VA dissection, cardiomyopathy and at significant risk of neurological worsening, death form recurrent stroke, hemorrhagic conversion, hydrocephalus, brain herniation, cerebral edema, heart failure. This patient's care requires constant monitoring of vital signs, hemodynamics, respiratory and cardiac monitoring, review of multiple databases, neurological assessment, discussion with family, other specialists and medical decision making of high complexity. I spent 40 minutes of neurocritical care time in the care of this patient.   To contact Stroke Continuity provider, please refer to [email or web address]. After hours, contact General Neurology

## 2017-02-23 ENCOUNTER — Inpatient Hospital Stay (HOSPITAL_COMMUNITY): Payer: Self-pay

## 2017-02-23 DIAGNOSIS — G911 Obstructive hydrocephalus: Secondary | ICD-10-CM

## 2017-02-23 DIAGNOSIS — I63219 Cerebral infarction due to unspecified occlusion or stenosis of unspecified vertebral arteries: Secondary | ICD-10-CM

## 2017-02-23 LAB — COMPREHENSIVE METABOLIC PANEL
ALK PHOS: 73 U/L (ref 38–126)
ALT: 13 U/L — ABNORMAL LOW (ref 17–63)
AST: 16 U/L (ref 15–41)
Albumin: 3.6 g/dL (ref 3.5–5.0)
Anion gap: 9 (ref 5–15)
BUN: 10 mg/dL (ref 6–20)
CALCIUM: 8.9 mg/dL (ref 8.9–10.3)
CHLORIDE: 104 mmol/L (ref 101–111)
CO2: 26 mmol/L (ref 22–32)
Creatinine, Ser: 0.8 mg/dL (ref 0.61–1.24)
GFR calc non Af Amer: >60 mL/min (ref >60)
Glucose, Bld: 191 mg/dL — ABNORMAL HIGH (ref 65–99)
Potassium: 3.8 mmol/L (ref 3.5–5.1)
SODIUM: 139 mmol/L (ref 135–145)
Total Bilirubin: 1 mg/dL (ref 0.3–1.2)
Total Protein: 6.6 g/dL (ref 6.5–8.1)

## 2017-02-23 LAB — CBC WITH DIFFERENTIAL/PLATELET
Basophils Absolute: 0 10*3/uL (ref 0.0–0.1)
Basophils Relative: 0 %
EOS ABS: 0 10*3/uL (ref 0.0–0.7)
EOS PCT: 0 %
HCT: 49.7 % (ref 39.0–52.0)
Hemoglobin: 16.9 g/dL (ref 13.0–17.0)
LYMPHS ABS: 1.7 10*3/uL (ref 0.7–4.0)
Lymphocytes Relative: 19 %
MCH: 29.7 pg (ref 26.0–34.0)
MCHC: 34 g/dL (ref 30.0–36.0)
MCV: 87.3 fL (ref 78.0–100.0)
MONOS PCT: 5 %
Monocytes Absolute: 0.4 10*3/uL (ref 0.1–1.0)
Neutro Abs: 6.9 10*3/uL (ref 1.7–7.7)
Neutrophils Relative %: 76 %
PLATELETS: 223 10*3/uL (ref 150–400)
RBC: 5.69 MIL/uL (ref 4.22–5.81)
RDW: 13.3 % (ref 11.5–15.5)
WBC: 9 10*3/uL (ref 4.0–10.5)

## 2017-02-23 LAB — SODIUM, URINE, RANDOM: SODIUM UR: 230 mmol/L

## 2017-02-23 LAB — CREATININE, URINE, RANDOM: Creatinine, Urine: 30.92 mg/dL

## 2017-02-23 LAB — SODIUM
SODIUM: 143 mmol/L (ref 135–145)
Sodium: 140 mmol/L (ref 135–145)

## 2017-02-23 LAB — GLUCOSE, CAPILLARY
Glucose-Capillary: 198 mg/dL — ABNORMAL HIGH (ref 65–99)
Glucose-Capillary: 152 mg/dL — ABNORMAL HIGH (ref 65–99)
Glucose-Capillary: 190 mg/dL — ABNORMAL HIGH (ref 65–99)
Glucose-Capillary: 195 mg/dL — ABNORMAL HIGH (ref 65–99)

## 2017-02-23 LAB — TRIGLYCERIDES: TRIGLYCERIDES: 112 mg/dL (ref <150)

## 2017-02-23 LAB — OSMOLALITY, URINE: Osmolality, Ur: 606 mOsm/kg (ref 300–900)

## 2017-02-23 LAB — PHOSPHORUS: Phosphorus: 2.9 mg/dL (ref 2.5–4.6)

## 2017-02-23 LAB — MAGNESIUM: MAGNESIUM: 1.9 mg/dL (ref 1.7–2.4)

## 2017-02-23 MED ORDER — SODIUM CHLORIDE 1 G PO TABS
2.0000 g | ORAL_TABLET | Freq: Two times a day (BID) | ORAL | Status: DC
  Administered 2017-02-23 – 2017-02-27 (×8): 2 g via ORAL
  Filled 2017-02-23 (×8): qty 2

## 2017-02-23 MED ORDER — SODIUM CHLORIDE 23.4 % INJECTION (4 MEQ/ML) FOR IV ADMINISTRATION
30.0000 mL | Freq: Once | INTRAVENOUS | Status: AC
  Administered 2017-02-23: 30 mL via INTRAVENOUS
  Filled 2017-02-23: qty 30

## 2017-02-23 MED ORDER — CLEVIDIPINE BUTYRATE 0.5 MG/ML IV EMUL
0.0000 mg/h | INTRAVENOUS | Status: DC
  Administered 2017-02-23: 1 mg/h via INTRAVENOUS
  Filled 2017-02-23: qty 50

## 2017-02-23 NOTE — Progress Notes (Signed)
NEUROHOSPITALISTS STROKE TEAM - DAILY PROGRESS NOTE   SUBJECTIVE (INTERVAL HISTORY) Wife is at the bedside. Patient repeat CT this am showed no progression of hydrocephalus. Continue on 3% saline. Na 139 this am, will give another 23.4%. Add salt tablet. Check urine sodium to evaluate salt wasting syndrome.     OBJECTIVE Lab Results: CBC:  Recent Labs  Lab 02/21/17 0918 02/22/17 0420 02/23/17 0520  WBC 10.1 11.9* 9.0  HGB 15.8 17.5* 16.9  HCT 46.9 49.8 49.7  MCV 89.0 85.9 87.3  PLT 223 236 223   BMP: Recent Labs  Lab 02/19/17 1118 02/20/17 0841  02/21/17 0340  02/22/17 0420 02/22/17 1339 02/22/17 1801 02/22/17 2334 02/23/17 0520  NA 134* 139   < > 139   < > 139 138 143 140 139  K 3.7  --   --  3.8  --  3.3*  --   --   --  3.8  CL 102  --   --  107  --  102  --   --   --  104  CO2 22  --   --  23  --  25  --   --   --  26  GLUCOSE 199*  --   --  145*  --  134*  --   --   --  191*  BUN 17  --   --  10  --  9  --   --   --  10  CREATININE 1.06 0.97  --  0.93  --  0.84  --   --   --  0.80  CALCIUM 9.1  --   --  8.5*  --  9.1  --   --   --  8.9  MG 1.9  --   --   --   --  2.0  --   --   --  1.9  PHOS  --   --   --   --   --  3.2  --   --   --  2.9   < > = values in this interval not displayed.   Liver Function Tests:  Recent Labs  Lab 02/19/17 1118 02/22/17 0420 02/23/17 0520  AST 21 19 16   ALT 12* 12* 13*  ALKPHOS 80 81 73  BILITOT 0.7 1.1 1.0  PROT 7.5 6.9 6.6  ALBUMIN 4.1 3.8 3.6   Recent Labs  Lab 02/19/17 1118  LIPASE 50   Thyroid Function Studies:  Recent Labs    02/20/17 1527  TSH 0.765   Cardiac Enzymes:  Recent Labs  Lab 02/19/17 1118 02/19/17 1957  TROPONINI 0.04* 0.05*   Urine Drug Screen:     Component Value Date/Time   LABOPIA POSITIVE (A) 02/20/2017 1600   COCAINSCRNUR NONE DETECTED 02/20/2017 1600   COCAINSCRNUR NONE DETECTED 12/29/2015 0823   LABBENZ POSITIVE (A)  02/20/2017 1600   AMPHETMU NONE DETECTED 02/20/2017 1600   THCU NONE DETECTED 02/20/2017 1600   LABBARB POSITIVE (A) 02/20/2017 1600    PHYSICAL EXAM Temp:  [96.9 F (36.1 C)-98.7 F (37.1 C)] 98.5 F (36.9 C) (02/01 2038) Pulse Rate:  [55-100] 73 (02/02 0700) Resp:  [8-31] 12 (02/02 0700) BP: (126-213)/(70-123) 149/90 (02/02 0700) SpO2:  [93 %-100 %] 98 % (02/02 0700) General - Well nourished, well developed, in no apparent distress HEENT-  Normocephalic, Cardiovascular - Regular rate and rhythm  Respiratory - Lungs clear bilaterally. No wheezing. Abdomen - soft and non-tender,  BS normal Extremities- no edema or cyanosis Neurological Examination Mental Status: Alert, oriented, thought content appropriate.  Speech fluent without evidence of aphasia. Able to follow 3 step commands without difficulty. Cranial Nerves: II: Visual fields grossly normal,  III,IV, VI: ptosis not present, extra-ocular motions intact bilaterally, pupils equal, round, reactive to light and accommodation V,VII: smile symmetric, facial light touch sensation normal bilaterally VIII: hearing normal bilaterally IX,X: uvula rises symmetrically XI: bilateral shoulder shrug XII: midline tongue extension Motor: Right : Upper extremity   5/5    Left:     Upper extremity   5/5  Lower extremity   5/5     Lower extremity   5/5 Tone and bulk:normal tone throughout; no atrophy noted Sensory: Pinprick and light touch intact throughout, bilaterally Deep Tendon Reflexes: 2+ and symmetric throughout Plantars: Right: downgoing   Left: downgoing Cerebellar: normal finger-to-nose, normal rapid alternating movements and normal heel-to-shin test Gait: not tested  IMAGING: I have personally reviewed the radiological images below and agree with the radiology interpretations.  Mr Maxine Glenn Neck W Wo Contrast Result Date: 02/20/2017 IMPRESSION: 1. 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 The vessel appears to be reconstituted in the distal V2 segment. 2. Negative MRI appearance of the left vertebral artery and bilateral cervical carotid arteries. Electronically Signed   By: Odessa Fleming M.D.   On: 02/20/2017 12:54   Mr Brain Wo Contrast  Result Date: 02/19/2017 IMPRESSION: 1. 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 2. No hydrocephalus. 3. Small remote right corona radiata infarct. Electronically Signed   By: Marnee Spring M.D.   On: 02/19/2017 15:51   Ct Angio Head/ Neck W Or Wo Contrast Result Date: 02/19/2017  IMPRESSION: 1. The right vertebral artery is abnormal throughout its course. 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 2. Unfortunately the distal right vertebral artery is dominant. The left vertebral is normal but diminutive beyond the left PICA. Still, the vertebrobasilar junction, basilar artery, and other posterior circulation remain patent. 3. Stable CT appearance of the brain since 1156 hours today with no acute infarct evident. 4. Minimal carotid atherosclerosis, no carotid stenosis, and otherwise negative anterior circulation. Electronically Signed   By: Odessa Fleming M.D.   On: 02/19/2017 13:51   Ct Head Wo Contrast Result Date: 02/19/2017 IMPRESSION: 1. No evidence of acute intracranial abnormality. 2. 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 Electronically Signed   By: Sebastian Ache M.D.   On: 02/19/2017 12:10   Echocardiogram:                                               Study Conclusions  - Left ventricle: The cavity size was normal. Systolic function was   moderately reduced. The estimated ejection fraction was in the   range of 35% to 40%. Images were inadequate for LV wall motion   assessment and cannot visualize the apex adequately. Features are   consistent with  a pseudonormal left ventricular filling pattern,   with concomitant abnormal relaxation and increased filling   pressure (grade 2 diastolic dysfunction). Doppler parameters are   consistent with high ventricular filling pressure. - Aortic valve: Trileaflet; normal thickness, mildly calcified   leaflets. - Mitral valve:  There was trivial regurgitation. - Left atrium: The atrium was mildly dilated. - Pulmonary arteries: PA peak pressure: 34 mm Hg (S). - Pericardium, extracardiac: A trivial pericardial effusion was   identified posterior to the heart. - Recommendations: Limited study with definity contrast to assess   wall motion and rule out apical thrombus.  Echocardiogram Limited:       02-22-2017, 12:29:44            Study Conclusions - Technical notes: Definity to assess LV. - Left ventricle: Systolic function was mildly reduced. The   estimated ejection fraction was in the range of 45% to 50%. Impressions:- LVEF 45-50% - no mural thrombus.  CT HEAD WITHOUT CONTRAST 02/21/2017 22:45 IMPRESSION: 1. Evolving RIGHT posterior-inferior cerebellar artery territory infarct with increasing edema and mass effect, no hemorrhagic conversion. 2. New mild obstructive hydrocephalus.  CT HEAD WITHOUT CONTRAST  02/22/2017 03:01 IMPRESSION: 1. Stable distribution of right PICA infarction. Stable associated mass effect with partial effacement of fourth ventricle and quadrigeminal plate cistern. Stable ventricle size with mild hydrocephalus. 2. No new acute intracranial abnormality identified.  Ct Head Wo Contrast 02/23/2017 IMPRESSION: 1. Unchanged right PICA infarct and associated posterior fossa mass effect. Unchanged mild hydrocephalus. 2. No new intracranial abnormality.      IMPRESSION:  52 year old male with history of diabetes, hypertension, smoker admitted for dizziness, nausea and vomiting.  He admitted on and off neck pain for the last 1 week.  CT head no hemorrhage.  CTA head  and neck showed occluded right vertebral artery most likely dissection.  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  MRI showed right cerebellum infarct and remote right CR infarct.  EF 35-40%, not able to rule out LV thrombosis.  TTE with contrast showed EF 45% and no mural thrombus.  LDL 63, TG 253 and A1c 7.7.  His TG much improved from 1 year ago.  Currently on aspirin, Plavix and Lipitor.  Patient had a stroke 12/2015 with left upper extremity numbness, gait imbalance and left-sided coordination difficulty.  MRI showed right BG/CR infarct.  Carotid Doppler negative.  EF 30-35%.  He has been following with cardiology in [facility].  TG 2631, A1c 9.5.  He is not compliant with medication at home.  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  Suspected Etiology: dissection vs athero vs cardioembolic Resultant Symptoms: Ataxia, Neck Pain, H/A, Nausea Stroke Risk Factors: diabetes mellitus, hyperlipidemia, hypertension and smoking Other Stroke Risk Factors: Advanced age, Hx stroke, CAD  PLAN  02/23/2017: Continue Aspirin/ Plavix/Statin Repeat Head CT STAT if Neuro exam acutely worsens Frequent neuro checks Telemetry monitoring PT/OT/SLP Consult PM & Rehab Consult Case Management /MSW Ongoing aggressive stroke risk factor management Patient counseled to be compliant with his antithrombotic medications Patient counseled on Lifestyle modifications including, Diet, Exercise, and Stress Follow up with [facility] in 6 weeks  HX OF STROKES: Small remote right corona radiata infarct  Headache Continue Fioricet PRN  Cerebral Edema  Moderate size cerebellar stroke, Repeat CT shows Mild hydrocephalus,  NA 139 today, Levels every 6 hrs, continue 3% saline.  NS consulted and no need for surgery at this time, Appreciate assistance Central line placement by CCM - Appreciate assistanace Obtain STAT if Neuro exam acutely  worsens  Leukocytosis  - resolved Remains afebrile, CXR 1/30 Negative Repeat labs in AM  Hypokalemia  Replacement in progress Repeat labs in AM  HYPERTENSION: Unstable, some elevated B/P's noted overnight Permissive hypertension -  SBP Goal now less than 180 Labetalol PRN Long term BP goal normotensive. Home Meds: Lisinopril restarted 02/22/2017  HYPERLIPIDEMIA:    Component Value Date/Time   CHOL 141 02/21/2017 0340   CHOL 174 07/30/2014 0902   TRIG 253 (H) 02/21/2017 0340   HDL 27 (L) 02/21/2017 0340   HDL 31 (L) 07/30/2014 0902   CHOLHDL 5.2 02/21/2017 0340   VLDL 51 (H) 02/21/2017 0340   LDLCALC 63 02/21/2017 0340   LDLCALC 111 (H) 07/30/2014 0902  Home Meds:  Lipitor 80 mg LDL  goal < 70 Continued on Lipitor to 80 mg daily Continue statin at discharge  DIABETES: Lab Results  Component Value Date   HGBA1C 7.7 (H) 02/19/2017  HgbA1c goal < 7.0 Currently on: Novolog Continue CBG monitoring and SSI to maintain glucose 140-180 mg/dl DM education   TOBACCO ABUSE Current smoker Smoking cessation counseling provided Nicotine patch provided  Other Active Problems: Active Problems:   Stroke (cerebrum) (HCC)   Ischemic stroke (HCC)   Type 2 diabetes mellitus with hyperlipidemia (HCC)   Uncontrolled type 2 diabetes mellitus with hyperglycemia (HCC)   Tobacco dependence   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 Yes; Fluid consistency: Thin   FAMILY UPDATES:  family at bedside  TEAM UPDATES: Merlene Laughter, DO   Prior Home Stroke Medications:  aspirin 81 mg daily  Discharge Stroke Meds:  Please discharge patient on aspirin 325 mg daily   Disposition: [facility] Hospital Therapy Recs:               PENDING Follow Up:  Follow-up Information    [facility] Follow up on 03/04/2017.   Why:  9 am for hospital follow  up Contact information: [address] [id number] [place] [id number] [phone]       Nilda Riggs, NP. Schedule an appointment as soon as possible for a visit in 6 week(s).   Specialty:  Family Medicine Contact information: [address] [place] [address] [phone]          Anola Gurney, [place] -PCP Follow up in 1-2 weeks      Assessment & plan discussed with with attending physician and they are in agreement.       ATTENDING NOTE: I reviewed above note and agree with the assessment and plan. I have made any additions or clarifications directly to the above note. Pt was seen and examined.   52 year old male with history of diabetes, hypertension, smoker admitted for dizziness, nausea and vomiting.  He admitted on and off neck pain for the last 1 week.  CT head no hemorrhage.  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  MRI showed right cerebellum infarct and remote right CR infarct.  EF 35-40%, not able to rule out LV thrombosis. Had TTE with contrast showed EF 45-50% and no mural thrombus.  LDL 63, TG 253 and A1c 7.7.  His TG much improved from 1 year ago.  Currently on aspirin, Plavix and Lipitor.  Patient had a stroke 12/2015 with left upper extremity numbness, gait imbalance and left-sided coordination difficulty.  MRI showed right BG/CR infarct.  Carotid Doppler negative.  EF 30-35%.  He has been following with cardiology in [facility].  TG 2631, A1c 9.5.  He  is not compliant with medication at home.  Patient 02/21/17 complained of severe headache with nausea vomiting.  Repeat CT showed mild hydrocephalus. Put on 3% saline and transfer to ICU. NSG Dr. Lovell SheehanJenkins contacted and no EVD at this time will continue monitoring. Pt HA improved and no more N/V. Repeat CT today stable mild hydrocephalus. Pt doing well. Na still 139. Urine  Na high at 230, consistent with salt wasting syndrome. Will add 23.4% saline once, and add salt tablet on top of 3% saline.   Marvel PlanJindong Delta Deshmukh, MD PhD Stroke Neurology 02/23/2017 4:01 PM   This patient is critically ill due to right cerebellar infarct, obstructive hydrocephalus, right VA dissection, cardiomyopathy and at significant risk of neurological worsening, death form recurrent stroke, hemorrhagic conversion, hydrocephalus, brain herniation, cerebral edema, heart failure. This patient's care requires constant monitoring of vital signs, hemodynamics, respiratory and cardiac monitoring, review of multiple databases, neurological assessment, discussion with family, other specialists and medical decision making of high complexity. I spent 35 minutes of neurocritical care time in the care of this patient.   To contact Stroke Continuity provider, please refer to [email or web address]. After hours, contact General Neurology

## 2017-02-23 NOTE — Progress Notes (Addendum)
Notified on-call neurologist regarding pt's 0000 Na result of 140. This was a decrease from the 1800 lab draw and RN was informed to increase 3% saline gtt to 5975ml/hr.  Pt's neuro status remains unchanged.  Will continue to monitor.  Francia GreavesSavannah R Taquilla Downum, RN

## 2017-02-23 NOTE — Care Management (Addendum)
Case manager spoke with patient's bedside RN, he is not medically ready for discharge at this time, case manager will continue to follow.

## 2017-02-23 NOTE — Progress Notes (Signed)
She looks good.  Minimal headache.  Denies any current vertigo.  Awake and alert.  Oriented and appropriate.  Moving all 4 extremities well.  No drift.  Follow-up PET CT scan with stable appearance of his cerebellar infarct without evidence of progressive hydrocephalus.  Overall doing well.  Continue ICU observation.  No plan for ventricular drainage.

## 2017-02-23 NOTE — Progress Notes (Signed)
PROGRESS NOTE    Kenneth [name]  ZOX:[id number] DOB: 07-19-1965 DOA: 02/20/2017 PCP: Anola Gurney, PA   Brief Narrative:  Kenneth [name] is a 52 y.o. male with a Past Medical History of DM, HTN, CAD, and CVA who presents with acute CVA diagnosed at [facility].  He was transferred from [place] to [facility] overnight on the Neurology service for diagnostic cerebral angiogram. TRH was consulted and assumed care. He was found to have an acute CVA and a Vertebral Artery Dissection. Workup is currently underway and Neurology is following. A Stat Head CT was obtained last night and showed worsening mass effect mild hydrocephalus obstructive and showed evolving Right PICA Territory Infarct. Because of Concern Neurology transferred patient to ICU and started 3% NS and Neurosurgery was consulted for evaluation and have no plans for Ventricular drainage at this time.   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 -CT Angio Head and Neck showed The right vertebral artery is abnormal throughout its course. 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 Unfortunately the distal right  vertebral artery is dominant. The Left vertebral is normal but diminutive beyond the left PICA. Still the vertebrobasilar junction, basilar artery, and other posterior circulation remain patent. Stable CT appearance of the brain since 1156 hours today with no acute infarct evident. . Minimal carotid atherosclerosis, no carotid stenosis, and otherwise negative anterior circulation. -MRI Brain showed Acute right pica territory infarct with known underlying right vertebral dissection. No hydrocephalus. Small remote right corona radiata infarct. -MRA Neck showed Abnormal MRA appearance of the right vertebral artery compatible with proximal vertebral artery dissection and thrombosis. The vessel appears to be reconstituted in the distal V2 segment. Negative MRI appearance of the left vertebral artery and bilateral cervical carotid arteries. -ECHOCardiogram showed EF of 35-40% with Grade 2 DD and ? Apical Thrombus -TEE Cancelled after my Discussion with Cardiology Dr. Royann Shivers and he recommended Limited ECHO with Contrast -Repeat Limited ECHO showed LVEF of 45-50% and no Mural Thrombus -Head CT w/o Contrast x2 was repeated last yesterday because of Worsening Headache;   -First Head CT showed Evolving RIGHT posterior-inferior cerebellar artery territory infarct with increasing edema and mass effect, no hemorrhagic conversion. New mild obstructive hydrocephalus -Second Head CT showed Stable distribution of right PICA infarction. Stable associated mass effect with partial effacement of fourth ventricle and quadrigeminal plate cistern. Stable ventricle size with mild hydrocephalus. No new acute intracranial abnormality identified. -Repeat CT Head this AM showed Unchanged right PICA infarct and associated posterior fossa mass effect abd unchanged mild hydrocephalus. No new intracranial abnormality. -Because of Worsening Mass effect with new mild Hydrocephalus patient was at risk for developing Herniation so was moved  to ICU overnight 1/31-2/1 and started on Hypertonic NS 3% at  50 mL/hr; IV NS 3% rate increased to 75 mL/hr today  -Checking Na+ Levels q6h -Neurosurgery Dr. Lovell Sheehan was consulted and felt that he did not need a Ventriculostomy presently and Dr. Jordan Likes re-evaluated and still holding off Ventricular Drainage.  -Neurology added Salt Tablets and checking Urine Sodium to evaluate for Salt Wasting Syndrome  -PCCM placed Triple Lumen Catheter  -Patient's Home BP medication with Lisinopril Started by Neurology  -Lipid Panel as below -UDS Positive for Barbiturates, Benzodiazepines, Opiates,  -TSH was 0.765 -C/w ASA and Plavix daily for DAPT at least 3 months.  Of note, patient was not taking ASA regularly and so this does not constitute primary ASA failure.  He did start ASA just prior and this can reportedly precipitate events. Regardless, will request CM assistance for Plavix but generic Clopidogrel is available from [facility] for $9 for 30 days and $24 for 90 days. -Neurochecks per Protocol  -PT/OT/SLP/Nutrition Consults; Recommending Home Health PT with [employer] with 5" Wheels  Cerebral Edema/Mild Obstructive Hydrocephalus -As Above;  -Started on 3% NS and Triple Lumen Placed; Rate Increased  -Neurosurgery consulted and no Ventriculostomy to be placed at this time.  -Per Neuro STAT Head If Neuro Exam Worsens  -Salt Tabs added by Neurology and checking Sodium Levels q6h -Neurology also giving 23.4% Sodium Chloride again today  Right Vertebral Artery Dissection -As Above -C/w ASA 325 mg po Daily, Plavix 75 mg po Daily, and Atorvastatin 80 mg po qHS -Neuro repeated Head CT and findings as above -C/w Telemetry Monitoring -Neurology started 3% NS at 50 mL/hr and increased it to 75 mL/hr  Acute Headache -Improving -C/w Acetaminophen and Fioricet PRN  HTN -Allow permissive HTN but Goal changed given instability overnight  -Treat BP only if >180/120, and then with goal of 15% reduction    -Lisinopril Restarted by Neurology  -Patient has not been taking any medications for BP.   -Lisinopril and Metoprolol are both available on the $4 [facility] list and would be excellent options for affordability at D/C, but there are a multitude of other medications also available on the list at [facility]. -Continue To Monitor closely   HLD -Lipid Panel showed Cholesterol of 141, HDL of 27, LDL of 63, TG of 253, and VLDL 51 -Patient now on Atorvastatin 80 mg po Daily  -Review of Home Medication list shows patient is supposed to be on Gemfibrozil 600 mg po BID  -Generic Atorvastatin 40 mg is available on the [facility] list for $9 for 30 tablets or $24 for 90 tablets and this would clearly be his best option but will need to check 80 mg cost  Diabetes Mellitus Type 2  -Patient's A1c is 7.7 indicating at least some compliance with medications and/or dietary strategies (at least compared to prior).  -He was previously prescribed Glucophage 1000 mg BID, and this medication is available for $4/month or $10/3 months at [facility]. -Review of Home Medication list also shows patient was supposed to be on Glimepiride 4 mg po daily but not taking -C/w Senistive Novolog SSI AC -CBG's ranging from 152-190  Tobacco Abuse/Dependence -Smoking Cessation Counseling given. This was discussed with the patient and should be reviewed on an ongoing basis.   -Ordered Nicotine 14 mg TD Patch .  GERD  -Ordered Protonix IV BID on admission and will change to po Protonix 40 mg BID today  -Omeprazole 20 or 40 mg is available on the [facility] list for $9 for 30 capsules or $24 for 90 capsules.  CAD -Hx  of Stenting -ASA 325 mg po Daily, Clopidogrel 75 mg po daily, and C/w Morphine 2 mg q2hprn -No BB or ACE currently to allow for Permissive HTN -ECHOCardiogram done and showed EF of 35-40% and Grade 2 DD with ? Apical Thrombus  -Repeat Limited ECHO showes no Thrombus -Will need Outpatient Cardiology Follow up when stable  to D/C  Leukocytosis, improving  -Improving as WBC went from 21.2 -> 10.1 -> 11.9 -> 9.0 -Continue to Monitor for S/Sx of Infection -Repeat CBC in AM   Erythrocytosis, slightly improved -Likely in the setting of Tobacco Abuse -Continue to Monitor  Constipation -C/w Senna Docusate 1 tab po qHS and Miralax 17 grams po Daily   DVT prophylaxis: Heparin 5,000 units sq q8h Code Status: FULL CODE Family Communication: No family at Bedside Disposition Plan: D/C Home with Home Health PT/OT when medically stable   Consultants:   Neurology Stroke Team  Neurosurgery    Procedures: ECHOCARDIOGRAM ------------------------------------------------------------------- Study Conclusions  - Left ventricle: The cavity size was normal. Systolic function was   moderately reduced. The estimated ejection fraction was in the   range of 35% to 40%. Images were inadequate for LV wall motion   assessment and cannot visualize the apex adequately. Features are   consistent with a pseudonormal left ventricular filling pattern,   with concomitant abnormal relaxation and increased filling   pressure (grade 2 diastolic dysfunction). Doppler parameters are   consistent with high ventricular filling pressure. - Aortic valve: Trileaflet; normal thickness, mildly calcified   leaflets. - Mitral valve: There was trivial regurgitation. - Left atrium: The atrium was mildly dilated. - Pulmonary arteries: PA peak pressure: 34 mm Hg (S). - Pericardium, extracardiac: A trivial pericardial effusion was   identified posterior to the heart. - Recommendations: Limited study with definity contrast to assess   wall motion and rule out apical thrombus.  Recommendations:  Limited study with definity contrast to assess wall motion and rule out apical thrombus.  LIMITED ECHOCARDIOGRAM 02/22/17 ------------------------------------------------------------------- Study Conclusions  - Technical notes: Definity to assess  LV. - Left ventricle: Systolic function was mildly reduced. The   estimated ejection fraction was in the range of 45% to 50%.  Impressions:  - LVEF 45-50% - no mural thrombus.   Antimicrobials:  Anti-infectives (From admission, onward)   None     Subjective: Patient was seen and examined and was curled up in bed and just wanted to rest. Headache had improved. No CP or SOB. Has not had a bowel movement in a few days.   Objective: Vitals:   02/23/17 1500 02/23/17 1600 02/23/17 1700 02/23/17 1800  BP: (!) 193/96 (!) 165/92 (!) 154/80 (!) 166/94  Pulse: 71 68 71 73  Resp: (!) 21 14 10 15   Temp:  98.7 F (37.1 C)    TempSrc:  Oral    SpO2: 100% 98% 99% 100%  Weight:      Height:        Intake/Output Summary (Last 24 hours) at 02/23/2017 1910 Last data filed at 02/23/2017 1800 Gross per 24 hour  Intake 1553.87 ml  Output 4550 ml  Net -2996.13 ml   Filed Weights   02/20/17 0501  Weight: 76.4 kg (168 lb 6.9 oz)   Examination: Physical Exam:  Constitutional: WN/WD Caucasian Male in NAD Eyes: Sclerae anicteric. Lids normal ENMT: External Ears and nose appear normal Neck: Supple with no JVD Respiratory: Diminished but unlabored breathing. No appreciable wheezing/rales/rhonchi Cardiovascular: RRR; No appreciable m/r/g. No extremity edema Abdomen: Soft,  NT, ND. Bowel sounds present  GU: Deferred Musculoskeletal: No contractures; No cyanosis Skin: Warm and Dry; No rashes or lesions on a limited skin eval  Neurologic: CN 2-12 grossly intact. No appreciable focal deficits Psychiatric: Slightly agitated mood and affect. Intact judgement and inisight  Data Reviewed: I have personally reviewed following labs and imaging studies  CBC: Recent Labs  Lab 02/19/17 1118 02/20/17 0841 02/21/17 0918 02/22/17 0420 02/23/17 0520  WBC 10.1 21.2* 10.1 11.9* 9.0  NEUTROABS 6.1  --  6.9 9.2* 6.9  HGB 17.8 17.0 15.8 17.5* 16.9  HCT 52.6* 48.4 46.9 49.8 49.7  MCV 85.9 86.3 89.0  85.9 87.3  PLT 310 271 223 236 223   Basic Metabolic Panel: Recent Labs  Lab 02/19/17 1118 02/20/17 0841  02/21/17 0340  02/22/17 0420 02/22/17 1339 02/22/17 1801 02/22/17 2334 02/23/17 0520 02/23/17 1115  NA 134* 139   < > 139   < > 139 138 143 140 139 143  K 3.7  --   --  3.8  --  3.3*  --   --   --  3.8  --   CL 102  --   --  107  --  102  --   --   --  104  --   CO2 22  --   --  23  --  25  --   --   --  26  --   GLUCOSE 199*  --   --  145*  --  134*  --   --   --  191*  --   BUN 17  --   --  10  --  9  --   --   --  10  --   CREATININE 1.06 0.97  --  0.93  --  0.84  --   --   --  0.80  --   CALCIUM 9.1  --   --  8.5*  --  9.1  --   --   --  8.9  --   MG 1.9  --   --   --   --  2.0  --   --   --  1.9  --   PHOS  --   --   --   --   --  3.2  --   --   --  2.9  --    < > = values in this interval not displayed.   GFR: Estimated Creatinine Clearance: 111.5 mL/min (by C-G formula based on SCr of 0.8 mg/dL). Liver Function Tests: Recent Labs  Lab 02/19/17 1118 02/22/17 0420 02/23/17 0520  AST 21 19 16   ALT 12* 12* 13*  ALKPHOS 80 81 73  BILITOT 0.7 1.1 1.0  PROT 7.5 6.9 6.6  ALBUMIN 4.1 3.8 3.6   Recent Labs  Lab 02/19/17 1118  LIPASE 50   No results for input(s): AMMONIA in the last 168 hours. Coagulation Profile: No results for input(s): INR, PROTIME in the last 168 hours. Cardiac Enzymes: Recent Labs  Lab 02/19/17 1118 02/19/17 1957  TROPONINI 0.04* 0.05*   BNP (last 3 results) No results for input(s): PROBNP in the last 8760 hours. HbA1C: No results for input(s): HGBA1C in the last 72 hours. CBG: Recent Labs  Lab 02/22/17 1639 02/22/17 2207 02/23/17 0738 02/23/17 1149 02/23/17 1616  GLUCAP 244* 204* 198* 152* 190*   Lipid Profile: Recent Labs    02/21/17 0340 02/23/17 1115  CHOL 141  --  HDL 27*  --   LDLCALC 63  --   TRIG 253* 112  CHOLHDL 5.2  --    Thyroid Function Tests: No results for input(s): TSH, T4TOTAL, FREET4, T3FREE,  THYROIDAB in the last 72 hours. Anemia Panel: No results for input(s): VITAMINB12, FOLATE, FERRITIN, TIBC, IRON, RETICCTPCT in the last 72 hours. Sepsis Labs: Recent Labs  Lab 02/19/17 0059 02/19/17 1118 02/19/17 1957  LATICACIDVEN 1.7 2.7* 0.9    Recent Results (from the past 240 hour(s))  MRSA PCR Screening     Status: None   Collection Time: 02/20/17  5:30 AM  Result Value Ref Range Status   MRSA by PCR NEGATIVE NEGATIVE Final    Comment:        The GeneXpert MRSA Assay (FDA approved for NASAL specimens only), is one component of a comprehensive MRSA colonization surveillance program. It is not intended to diagnose MRSA infection nor to guide or monitor treatment for MRSA infections.          Radiology Studies: Ct Head Wo Contrast  Result Date: 02/23/2017 CLINICAL DATA:  Stroke follow-up. EXAM: CT HEAD WITHOUT CONTRAST TECHNIQUE: Contiguous axial images were obtained from the base of the skull through the vertex without intravenous contrast. COMPARISON:  02/22/2017 FINDINGS: Brain: Right PICA territory infarct is again noted with unchanged edema and associated mass effect. Mild leftward midline shift in the posterior fossa is unchanged, as is partial effacement of the fourth ventricle and quadrigeminal cistern. The lateral and third ventricles are unchanged in size and remain mildly dilated compared to the CT at the time of presentation on 02/19/2017. There is no evidence of new infarct, intracranial hemorrhage, or extra-axial fluid collection. A chronic lacunar infarct is again noted in the posterior right internal capsule/corona radiata. Vascular: No hyperdense vessel. Skull: No fracture or focal osseous lesion. Sinuses/Orbits: Visualized paranasal sinuses and mastoid air cells are clear. Orbits are unremarkable. Other: None. IMPRESSION: 1. Unchanged right PICA infarct and associated posterior fossa mass effect. Unchanged mild hydrocephalus. 2. No new intracranial  abnormality. Electronically Signed   By: Sebastian Ache M.D.   On: 02/23/2017 08:29   Ct Head Wo Contrast  Result Date: 02/22/2017 CLINICAL DATA:  52 y/o  M; acute stroke for follow-up. EXAM: CT HEAD WITHOUT CONTRAST TECHNIQUE: Contiguous axial images were obtained from the base of the skull through the vertex without intravenous contrast. COMPARISON:  02/21/2017 CT head.  02/19/2017 MRI head. FINDINGS: Brain: Stable distribution of right PICA infarction. Stable edema and associated mass effect with right to left midline shift in the posterior fossa, mass effect on right posterior medulla, partial effacement of quadrigeminal plate cistern, and partial effacement of the fourth ventricle. Stable size of the lateral and third ventricles. No new large acute stroke, hemorrhage, or additional focus of mass effect identified. Stable incidental empty sella turcica. Vascular: No hyperdense vessel or unexpected calcification. Skull: Normal. Negative for fracture or focal lesion. Sinuses/Orbits: No acute finding. Other: None. IMPRESSION: 1. Stable distribution of right PICA infarction. Stable associated mass effect with partial effacement of fourth ventricle and quadrigeminal plate cistern. Stable ventricle size with mild hydrocephalus. 2. No new acute intracranial abnormality identified. Electronically Signed   By: Mitzi Hansen M.D.   On: 02/22/2017 03:01   Ct Head Wo Contrast  Result Date: 02/21/2017 CLINICAL DATA:  Follow-up code stroke. History of RIGHT vertebral artery dissection, hypertension, diabetes and stroke. EXAM: CT HEAD WITHOUT CONTRAST TECHNIQUE: Contiguous axial images were obtained from the base of the skull through  the vertex without intravenous contrast. COMPARISON:  MRI of the head February 19, 2017 FINDINGS: BRAIN: Evolving RIGHT inferior cerebellar infarct with cytotoxic edema, regional mass effect and mild midline shift. The obex appears effaced, effaced foramen of Luschka. Early  obstructive hydrocephalus (diameter of bifrontal horns was 3.8 cm, now 4.5 cm with slightly enlarged temporal horns). No frank interstitial edema/transependymal flow cerebral spinal fluid. Old RIGHT corona radiata/internal capsule lacunar infarct. No intraparenchymal hemorrhage. No abnormal extra-axial fluid collections. Effaced RIGHT quadrigeminal cistern. Empty sella. VASCULAR: Unremarkable. SKULL: No skull fracture. No significant scalp soft tissue swelling. SINUSES/ORBITS: The mastoid air-cells and included paranasal sinuses are well-aerated.The included ocular globes and orbital contents are non-suspicious. OTHER: None. IMPRESSION: 1. Evolving RIGHT posterior-inferior cerebellar artery territory infarct with increasing edema and mass effect, no hemorrhagic conversion. 2. New mild obstructive hydrocephalus. Acute findings text paged to Dr.Lindzen, Neurology via AMION secure system on 02/21/2017 at 10:43 pm, including interpreting physician's phone number. Electronically Signed   By: Awilda Metro M.D.   On: 02/21/2017 22:45   Dg Chest Port 1 View  Result Date: 02/22/2017 CLINICAL DATA:  Central line placement EXAM: PORTABLE CHEST 1 VIEW COMPARISON:  02/20/2017 FINDINGS: 1527 hours. Left IJ central line is new in the interval with tip overlying the SVC/RA junction. No evidence for left-sided pneumothorax. Lungs clear. The cardiopericardial silhouette is within normal limits for size. The visualized bony structures of the thorax are intact. Telemetry leads overlie the chest. IMPRESSION: Left IJ central line tip overlies the SVC/RA junction. No evidence for pneumothorax. Electronically Signed   By: Kennith Center M.D.   On: 02/22/2017 15:35   Scheduled Meds: .  stroke: mapping our early stages of recovery book   Does not apply Once  . aspirin EC  325 mg Oral Daily  . atorvastatin  80 mg Oral q1800  . Chlorhexidine Gluconate Cloth  6 each Topical Q0600  . clopidogrel  75 mg Oral Daily  . heparin  5,000  Units Subcutaneous Q8H  . insulin aspart  0-9 Units Subcutaneous TID WC  . lisinopril  10 mg Oral BID  . pantoprazole  40 mg Oral BID  . polyethylene glycol  17 g Oral Daily  . potassium chloride  40 mEq Oral BID  . senna-docusate  1 tablet Oral QHS  . sodium chloride flush  10-40 mL Intracatheter Q12H  . sodium chloride  2 g Oral BID WC   Continuous Infusions: . clevidipine Stopped (02/23/17 0900)  . sodium chloride (hypertonic) 75 mL/hr at 02/23/17 1800    LOS: 3 days   Merlene Laughter, DO [facility] Pager [phone]  If 7PM-7AM, please contact night-coverage [email or web address] Password [email or web address] 02/23/2017, 7:10 PM

## 2017-02-23 NOTE — Progress Notes (Signed)
OT Cancellation Note  Patient Details Name: Kenneth [name] MRN: [id number] DOB: 02/24/1965   Cancelled Treatment:    Reason Eval/Treat Not Completed: Pain limiting ability to participate.  Pt reports  headache and "I just want to be still right now". Unable to persuade pt to participate at this time.   Will reattempt.  Mylena Sedberry [place], [name] [phone]   [name], Thoms Barthelemy M 02/23/2017, 10:14 AM

## 2017-02-23 NOTE — Progress Notes (Signed)
Blood pressures consistently have been high today with PRN morphine and labetalol not having sufficient BP lowering effect early this AM.  He is easily awakened, follows all commands and with normal speech.   A/R: 52 year old male with large right cerebellar infarction secondary to right vertebral artery dissection. There is mass effect with mild obstructive hydrocephalus, which was unchanged on head CT performed at 0247 yesterday.  1. HTN - Clevidipine gtt has been ordered. 2. Cerebellar stroke with mass effect and mild obstructive hydrocephalus - obtaining repeat CT at 0800.  Electronically signed: Dr. Caryl PinaEric Taylar Hartsough

## 2017-02-24 DIAGNOSIS — Z452 Encounter for adjustment and management of vascular access device: Secondary | ICD-10-CM

## 2017-02-24 LAB — CBC WITH DIFFERENTIAL/PLATELET
Basophils Absolute: 0 10*3/uL (ref 0.0–0.1)
Basophils Relative: 1 %
EOS ABS: 0.1 10*3/uL (ref 0.0–0.7)
Eosinophils Relative: 1 %
HCT: 48.5 % (ref 39.0–52.0)
HEMOGLOBIN: 16.3 g/dL (ref 13.0–17.0)
LYMPHS ABS: 2 10*3/uL (ref 0.7–4.0)
LYMPHS PCT: 25 %
MCH: 29.6 pg (ref 26.0–34.0)
MCHC: 33.6 g/dL (ref 30.0–36.0)
MCV: 88.2 fL (ref 78.0–100.0)
Monocytes Absolute: 0.4 10*3/uL (ref 0.1–1.0)
Monocytes Relative: 6 %
Neutro Abs: 5.2 10*3/uL (ref 1.7–7.7)
Neutrophils Relative %: 67 %
Platelets: 240 10*3/uL (ref 150–400)
RBC: 5.5 MIL/uL (ref 4.22–5.81)
RDW: 13.4 % (ref 11.5–15.5)
WBC: 7.8 10*3/uL (ref 4.0–10.5)

## 2017-02-24 LAB — COMPREHENSIVE METABOLIC PANEL
ALK PHOS: 72 U/L (ref 38–126)
ALT: 14 U/L — ABNORMAL LOW (ref 17–63)
AST: 17 U/L (ref 15–41)
Albumin: 3.2 g/dL — ABNORMAL LOW (ref 3.5–5.0)
Anion gap: 7 (ref 5–15)
BUN: 8 mg/dL (ref 6–20)
CALCIUM: 8.9 mg/dL (ref 8.9–10.3)
CO2: 26 mmol/L (ref 22–32)
CREATININE: 0.85 mg/dL (ref 0.61–1.24)
Chloride: 108 mmol/L (ref 101–111)
Glucose, Bld: 181 mg/dL — ABNORMAL HIGH (ref 65–99)
Potassium: 3.8 mmol/L (ref 3.5–5.1)
SODIUM: 141 mmol/L (ref 135–145)
Total Bilirubin: 1 mg/dL (ref 0.3–1.2)
Total Protein: 6.2 g/dL — ABNORMAL LOW (ref 6.5–8.1)

## 2017-02-24 LAB — SODIUM
Sodium: 140 mmol/L (ref 135–145)
Sodium: 141 mmol/L (ref 135–145)
Sodium: 141 mmol/L (ref 135–145)
Sodium: 141 mmol/L (ref 135–145)
Sodium: 144 mmol/L (ref 135–145)

## 2017-02-24 LAB — GLUCOSE, CAPILLARY
GLUCOSE-CAPILLARY: 255 mg/dL — AB (ref 65–99)
GLUCOSE-CAPILLARY: 163 mg/dL — AB (ref 65–99)
GLUCOSE-CAPILLARY: 197 mg/dL — AB (ref 65–99)
Glucose-Capillary: 173 mg/dL — ABNORMAL HIGH (ref 65–99)

## 2017-02-24 LAB — MAGNESIUM: MAGNESIUM: 1.9 mg/dL (ref 1.7–2.4)

## 2017-02-24 LAB — PHOSPHORUS: PHOSPHORUS: 3.2 mg/dL (ref 2.5–4.6)

## 2017-02-24 MED ORDER — LISINOPRIL 20 MG PO TABS
20.0000 mg | ORAL_TABLET | Freq: Two times a day (BID) | ORAL | Status: DC
  Administered 2017-02-24 – 2017-02-27 (×7): 20 mg via ORAL
  Filled 2017-02-24 (×7): qty 1

## 2017-02-24 MED ORDER — SENNOSIDES-DOCUSATE SODIUM 8.6-50 MG PO TABS
1.0000 | ORAL_TABLET | Freq: Two times a day (BID) | ORAL | Status: DC
  Administered 2017-02-24 – 2017-02-26 (×3): 1 via ORAL
  Filled 2017-02-24 (×5): qty 1

## 2017-02-24 MED ORDER — POLYETHYLENE GLYCOL 3350 17 G PO PACK
17.0000 g | PACK | Freq: Two times a day (BID) | ORAL | Status: DC
  Filled 2017-02-24: qty 1

## 2017-02-24 NOTE — Progress Notes (Signed)
Overall doing well.  Denies headache.  Neurologic exam stable.  Follow-up head CT scan planned for tomorrow.

## 2017-02-24 NOTE — Progress Notes (Addendum)
NEUROHOSPITALISTS STROKE TEAM - DAILY PROGRESS NOTE   SUBJECTIVE (INTERVAL HISTORY) RN is at the bedside. Pt Na 143 this am. Denies HA, N/V. Asking for shower and wash hair, but I told him to wait a couple of days until central line is out. He is not happy but Ok with it. Will repeat CT in am     OBJECTIVE Lab Results: CBC:  Recent Labs  Lab 02/22/17 0420 02/23/17 0520 02/24/17 0531  WBC 11.9* 9.0 7.8  HGB 17.5* 16.9 16.3  HCT 49.8 49.7 48.5  MCV 85.9 87.3 88.2  PLT 236 223 240   BMP: Recent Labs  Lab 02/19/17 1118 02/20/17 0841  02/21/17 0340  02/22/17 0420  02/23/17 0520 02/23/17 1115 02/23/17 2345 02/24/17 0006 02/24/17 0531  NA 134* 139   < > 139   < > 139   < > 139 143 141 144 141  K 3.7  --   --  3.8  --  3.3*  --  3.8  --   --   --  3.8  CL 102  --   --  107  --  102  --  104  --   --   --  108  CO2 22  --   --  23  --  25  --  26  --   --   --  26  GLUCOSE 199*  --   --  145*  --  134*  --  191*  --   --   --  181*  BUN 17  --   --  10  --  9  --  10  --   --   --  8  CREATININE 1.06 0.97  --  0.93  --  0.84  --  0.80  --   --   --  0.85  CALCIUM 9.1  --   --  8.5*  --  9.1  --  8.9  --   --   --  8.9  MG 1.9  --   --   --   --  2.0  --  1.9  --   --   --  1.9  PHOS  --   --   --   --   --  3.2  --  2.9  --   --   --  3.2   < > = values in this interval not displayed.   Liver Function Tests:  Recent Labs  Lab 02/19/17 1118 02/22/17 0420 02/23/17 0520 02/24/17 0531  AST 21 19 16 17   ALT 12* 12* 13* 14*  ALKPHOS 80 81 73 72  BILITOT 0.7 1.1 1.0 1.0  PROT 7.5 6.9 6.6 6.2*  ALBUMIN 4.1 3.8 3.6 3.2*   Recent Labs  Lab 02/19/17 1118  LIPASE 50   Thyroid Function Studies:  No results for input(s): TSH, T4TOTAL, T3FREE, THYROIDAB in the last 72 hours. Cardiac Enzymes:  Recent Labs  Lab 02/19/17 1118 02/19/17 1957  TROPONINI 0.04* 0.05*   Urine Drug Screen:     Component Value Date/Time   LABOPIA POSITIVE (A) 02/20/2017 1600   COCAINSCRNUR NONE DETECTED 02/20/2017 1600   COCAINSCRNUR NONE DETECTED 12/29/2015 0823   LABBENZ POSITIVE (A) 02/20/2017 1600   AMPHETMU NONE DETECTED 02/20/2017 1600   THCU NONE DETECTED 02/20/2017 1600   LABBARB POSITIVE (A) 02/20/2017 1600    PHYSICAL EXAM Temp:  [97.6 F (36.4 C)-98.7 F (37.1 C)] 97.6 F (36.4 C) (  02/03 0751) Pulse Rate:  [60-88] 60 (02/03 0725) Resp:  [9-26] 18 (02/03 0725) BP: (145-193)/(78-112) 174/96 (02/03 0725) SpO2:  [93 %-100 %] 99 % (02/03 0725) General - Well nourished, well developed, in no apparent distress HEENT-  Normocephalic, Cardiovascular - Regular rate and rhythm  Respiratory - Lungs clear bilaterally. No wheezing. Abdomen - soft and non-tender, BS normal Extremities- no edema or cyanosis Neurological Examination Mental Status: Alert, oriented, thought content appropriate.  Speech fluent without evidence of aphasia. Able to follow 3 step commands without difficulty. Cranial Nerves: II: Visual fields grossly normal,  III,IV, VI: ptosis not present, extra-ocular motions intact bilaterally, pupils equal, round, reactive to light and accommodation V,VII: smile symmetric, facial light touch sensation normal bilaterally VIII: hearing normal bilaterally IX,X: uvula rises symmetrically XI: bilateral shoulder shrug XII: midline tongue extension Motor: Right : Upper extremity   5/5    Left:     Upper extremity   5/5  Lower extremity   5/5     Lower extremity   5/5 Tone and bulk:normal tone throughout; no atrophy noted Sensory: Pinprick and light touch intact throughout, bilaterally Deep Tendon Reflexes: 2+ and symmetric throughout Plantars: Right: downgoing   Left: downgoing Cerebellar: normal finger-to-nose, normal rapid alternating movements and normal heel-to-shin test Gait: not tested  IMAGING: I have personally reviewed the radiological images below and agree with the radiology  interpretations.  Mr Maxine Glenn Neck W Wo Contrast Result Date: 02/20/2017 IMPRESSION: 1. 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 The vessel appears to be reconstituted in the distal V2 segment. 2. Negative MRI appearance of the left vertebral artery and bilateral cervical carotid arteries. Electronically Signed   By: Odessa Fleming M.D.   On: 02/20/2017 12:54   Mr Brain Wo Contrast  Result Date: 02/19/2017 IMPRESSION: 1. 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 2. No hydrocephalus. 3. Small remote right corona radiata infarct. Electronically Signed   By: Marnee Spring M.D.   On: 02/19/2017 15:51   Ct Angio Head/ Neck W Or Wo Contrast Result Date: 02/19/2017  IMPRESSION: 1. The right vertebral artery is abnormal throughout its course. 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 2. Unfortunately the distal right vertebral artery is dominant. The left vertebral is normal but diminutive beyond the left PICA. Still, the vertebrobasilar junction, basilar artery, and other posterior circulation remain patent. 3. Stable CT appearance of the brain since 1156 hours today with no acute infarct evident. 4. Minimal carotid atherosclerosis, no carotid stenosis, and otherwise negative anterior circulation. Electronically Signed   By: Odessa Fleming M.D.   On: 02/19/2017 13:51   Ct Head Wo Contrast Result Date: 02/19/2017 IMPRESSION: 1. No evidence of acute intracranial abnormality. 2. 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 Electronically Signed   By: Sebastian Ache M.D.   On: 02/19/2017 12:10   Echocardiogram:                                               Study Conclusions  - Left ventricle: The cavity size was normal. Systolic function was   moderately reduced. The estimated ejection fraction  was in the   range of 35% to 40%. Images were inadequate for LV wall motion  assessment and cannot visualize the apex adequately. Features are   consistent with a pseudonormal left ventricular filling pattern,   with concomitant abnormal relaxation and increased filling   pressure (grade 2 diastolic dysfunction). Doppler parameters are   consistent with high ventricular filling pressure. - Aortic valve: Trileaflet; normal thickness, mildly calcified   leaflets. - Mitral valve: There was trivial regurgitation. - Left atrium: The atrium was mildly dilated. - Pulmonary arteries: PA peak pressure: 34 mm Hg (S). - Pericardium, extracardiac: A trivial pericardial effusion was   identified posterior to the heart. - Recommendations: Limited study with definity contrast to assess   wall motion and rule out apical thrombus.  Echocardiogram Limited:       02-22-2017, 12:29:44            Study Conclusions - Technical notes: Definity to assess LV. - Left ventricle: Systolic function was mildly reduced. The   estimated ejection fraction was in the range of 45% to 50%. Impressions:- LVEF 45-50% - no mural thrombus.  CT HEAD WITHOUT CONTRAST 02/21/2017 22:45 IMPRESSION: 1. Evolving RIGHT posterior-inferior cerebellar artery territory infarct with increasing edema and mass effect, no hemorrhagic conversion. 2. New mild obstructive hydrocephalus.  CT HEAD WITHOUT CONTRAST  02/22/2017 03:01 IMPRESSION: 1. Stable distribution of right PICA infarction. Stable associated mass effect with partial effacement of fourth ventricle and quadrigeminal plate cistern. Stable ventricle size with mild hydrocephalus. 2. No new acute intracranial abnormality identified.  Ct Head Wo Contrast 02/23/2017 IMPRESSION: 1. Unchanged right PICA infarct and associated posterior fossa mass effect. Unchanged mild hydrocephalus. 2. No new intracranial abnormality.      IMPRESSION:  53 year old male with history of  diabetes, hypertension, smoker admitted for dizziness, nausea and vomiting.  He admitted on and off neck pain for the last 1 week.  CT head no hemorrhage.  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  MRI showed right cerebellum infarct and remote right CR infarct.  EF 35-40%, not able to rule out LV thrombosis.  TTE with contrast showed EF 45% and no mural thrombus.  LDL 63, TG 253 and A1c 7.7.  His TG much improved from 1 year ago.  Currently on aspirin, Plavix and Lipitor.  Patient had a stroke 12/2015 with left upper extremity numbness, gait imbalance and left-sided coordination difficulty.  MRI showed right BG/CR infarct.  Carotid Doppler negative.  EF 30-35%.  He has been following with cardiology in [place] [facility].  TG 2631, A1c 9.5.  He is not compliant with medication at home.  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  Suspected Etiology: dissection vs athero vs cardioembolic Resultant Symptoms: Ataxia, Neck Pain, H/A, Nausea Stroke Risk Factors: diabetes mellitus, hyperlipidemia, hypertension and smoking Other Stroke Risk Factors: Advanced age, Hx stroke, CAD  PLAN  02/24/2017: Continue Aspirin/ Plavix/Statin Repeat Head CT STAT if Neuro exam acutely worsens Frequent neuro checks Telemetry monitoring PT/OT/SLP Consult PM & Rehab Consult Case Management /MSW Ongoing aggressive stroke risk factor management Patient counseled to be compliant with his antithrombotic medications Patient counseled on Lifestyle modifications including, Diet, Exercise, and Stress Follow up with [facility] in 6 weeks  HX OF STROKES: Small remote right corona radiata infarct  Headache Continue Fioricet PRN  Cerebral Edema  Moderate size cerebellar stroke, Repeat CT shows Mild hydrocephalus,  NA 139 today, Levels every 6 hrs, continue 3% saline.  NS  consulted  and no need for surgery at this time, Appreciate assistance Central line placement by CCM - Appreciate assistanace Obtain STAT if Neuro exam acutely worsens  Leukocytosis  - resolved Remains afebrile, CXR 1/30 Negative Repeat labs in AM  Hypokalemia  Replacement in progress Repeat labs in AM  HYPERTENSION: Unstable, some elevated B/P's noted overnight Permissive hypertension - SBP Goal now less than 180 Labetalol PRN Long term BP goal normotensive. Home Meds: Lisinopril restarted 02/22/2017  HYPERLIPIDEMIA:    Component Value Date/Time   CHOL 141 02/21/2017 0340   CHOL 174 07/30/2014 0902   TRIG 112 02/23/2017 1115   HDL 27 (L) 02/21/2017 0340   HDL 31 (L) 07/30/2014 0902   CHOLHDL 5.2 02/21/2017 0340   VLDL 51 (H) 02/21/2017 0340   LDLCALC 63 02/21/2017 0340   LDLCALC 111 (H) 07/30/2014 0902  Home Meds:  Lipitor 80 mg LDL  goal < 70 Continued on Lipitor to 80 mg daily Continue statin at discharge  DIABETES: Lab Results  Component Value Date   HGBA1C 7.7 (H) 02/19/2017  HgbA1c goal < 7.0 Currently on: Novolog Continue CBG monitoring and SSI to maintain glucose 140-180 mg/dl DM education   TOBACCO ABUSE Current smoker Smoking cessation counseling provided Nicotine patch provided  Other Active Problems: Active Problems:   Stroke (cerebrum) (HCC)   Ischemic stroke (HCC)   Type 2 diabetes mellitus with hyperlipidemia (HCC)   Uncontrolled type 2 diabetes mellitus with hyperglycemia (HCC)   Tobacco dependence   Vertebral artery dissection (HCC)   Obstructive hydrocephalus    Hospital day # 4 VTE prophylaxis:  Heparin Diet : Fall precautions DIET DYS 3 Room service appropriate? Yes; Fluid consistency: Thin   FAMILY UPDATES:  family at bedside  TEAM UPDATES: Merlene Laughter, DO   Prior Home Stroke Medications:  aspirin 81 mg daily  Discharge Stroke Meds:  Please discharge patient on aspirin 325 mg daily   Disposition: 66-Critical Access  Hospital Therapy Recs:               PENDING Follow Up:  Follow-up Information    [facility] Follow up on 03/04/2017.   Why:  9 am for hospital follow up Contact information: [address] [id number] mc [place] [address] [phone]       Nilda Riggs, NP. Schedule an appointment as soon as possible for a visit in 6 week(s).   Specialty:  Family Medicine Contact information: [address] [place] [address] [phone]          Anola Gurney, [place] -PCP Follow up in 1-2 weeks      Assessment & plan discussed with with attending physician and they are in agreement.       ATTENDING NOTE: I reviewed above note and agree with the assessment and plan. I have made any additions or clarifications directly to the above note. Pt was seen and examined.   52 year old male with history of diabetes, hypertension, smoker admitted for dizziness, nausea and vomiting.  He admitted on and off neck pain for the last 1 week.  CT head no hemorrhage.  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  MRI showed right cerebellum infarct and remote right CR infarct.  EF 35-40%, not able to rule out LV thrombosis. Had TTE with contrast showed EF 45-50% and no mural thrombus.  LDL 63, TG 253 and A1c 7.7.  His TG much improved from 1 year ago.  Currently on aspirin, Plavix and Lipitor.  Patient had a stroke 12/2015 with left upper extremity numbness, gait imbalance and left-sided coordination difficulty.  MRI showed right BG/CR infarct.  Carotid Doppler negative.  EF 30-35%.  He has been following with cardiology in [place] clinic.  TG 2631, A1c 9.5.  He is not compliant with medication at home.  Patient 02/21/17 complained of severe headache with nausea vomiting.  Repeat CT showed mild hydrocephalus. Put on 3% saline and transfer to ICU. NSG Dr.  Lovell Sheehan contacted and no EVD at this time will continue monitoring. Pt HA improved and no more N/V. Repeat CT today stable mild hydrocephalus. Pt doing well. Na 139->143. Urine Na high at 230, consistent with salt wasting syndrome. Pt neurologically stable and symptoms free, will not chasing numbers. Continue 3% saline and salt tablets now, will consider to taper off for the next 2 days. Repeat CT in am.    Marvel Plan, MD PhD Stroke Neurology 02/24/2017 2:26 PM   This patient is critically ill due to right cerebellar infarct, obstructive hydrocephalus, right VA dissection, cardiomyopathy and at significant risk of neurological worsening, death form recurrent stroke, hemorrhagic conversion, hydrocephalus, brain herniation, cerebral edema, heart failure. This patient's care requires constant monitoring of vital signs, hemodynamics, respiratory and cardiac monitoring, review of multiple databases, neurological assessment, discussion with family, other specialists and medical decision making of high complexity. I spent 35 minutes of neurocritical care time in the care of this patient.   To contact Stroke Continuity provider, please refer to [email or web address]. After hours, contact General Neurology

## 2017-02-24 NOTE — Progress Notes (Signed)
PROGRESS NOTE    Kenneth CluckJimmy R Somero  ZOX:[id number] DOB: 12/20/1965 DOA: 02/20/2017 PCP: Anola Gurneyhauvin, Robert, PA   Brief Narrative:  Kenneth [name] is a 52 y.o. male with a Past Medical History of DM, HTN, CAD, and CVA who presents with acute CVA diagnosed at [facility].  [facility] [place] [facility] on [facility] Neurology service for diagnostic cerebral angiogram. TRH was consulted and assumed care. He was found to have an acute CVA and a Vertebral Artery Dissection. Workup is currently underway and Neurology is following. A Stat Head CT was obtained last night and showed worsening mass effect mild hydrocephalus obstructive and showed evolving Right PICA Territory Infarct. Because of Concern Neurology transferred patient to ICU and started 3% NS and Neurosurgery was consulted for evaluation and have no plans for Ventricular drainage at this time. Plan is to continue Hypertonic Saline for a few days and wean off and repeat Head CT in AM.     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 -CT Angio Head and Neck showed The right vertebral artery is abnormal throughout its course. 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 Unfortunately the distal right vertebral artery is dominant. The Left vertebral is normal but diminutive  beyond the left PICA. Still the vertebrobasilar junction, basilar artery, and other posterior circulation remain patent. Stable CT appearance of the brain since 1156 hours today with no acute infarct evident. . Minimal carotid atherosclerosis, no carotid stenosis, and otherwise negative anterior circulation. -MRI Brain showed Acute right pica territory infarct with known underlying right vertebral dissection. No hydrocephalus. Small remote right corona radiata infarct. -MRA Neck showed Abnormal MRA appearance of the right vertebral artery compatible with proximal vertebral artery dissection and thrombosis. The vessel appears to be reconstituted in the distal V2 segment. Negative MRI appearance of the left vertebral artery and bilateral cervical carotid arteries. -ECHOCardiogram showed EF of 35-40% with Grade 2 DD and ? Apical Thrombus -TEE Cancelled after my Discussion with Cardiology Dr. Royann Shiversroitoru and he recommended Limited ECHO with Contrast -Repeat Limited ECHO showed LVEF of 45-50% and no Mural Thrombus -Head CT w/o Contrast x2 was repeated last yesterday because of Worsening Headache;   -First Head CT showed Evolving RIGHT posterior-inferior cerebellar artery territory infarct with increasing edema and mass effect, no hemorrhagic conversion. New mild obstructive hydrocephalus -Second Head CT showed Stable distribution of right PICA infarction. Stable associated mass effect with partial effacement of fourth ventricle and quadrigeminal plate cistern. Stable ventricle size with mild hydrocephalus. No new acute intracranial abnormality identified. -Repeat CT Head 02/23/17 showed Unchanged right PICA infarct and associated posterior fossa mass effect abd unchanged mild hydrocephalus. No new intracranial abnormality. -Will repeat Head CT in AM  -Because of Worsening Mass effect with new mild Hydrocephalus patient was at risk for developing Herniation so was moved to ICU overnight 1/31-2/1 and started on  Hypertonic NS 3% at 50 mL/hr;  IV NS 3% rate increased to 75 mL/hr yesterday and plan is to continue for next few days and taper off if patient is improved -I discussed th case with Dr. Roda Shutters of Neurology who recommended a goal Na+ of 150-155 but since patient is improving want to just continue to monitor clinically  -Checking Na+ Levels q6h -Neurosurgery Dr. Lovell Sheehan was consulted and felt that he did not need a Ventriculostomy presently and Dr. Jordan Likes re-evaluated and still holding off Ventricular Drainage.  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 -Neurochecks per Protocol  -PT/OT/SLP/Nutrition Consults with continued therapy; Recommending Home Health PT with [employer] with 5" Wheels  Cerebral Edema/Mild Obstructive Hydrocephalus -As Above;  -Started on 3% NS and Triple Lumen Placed; Rate Increased to 75 mL yesterday  -Neurosurgery consulted and no Ventriculostomy to be placed at this time.  -Per Neuro STAT Head If Neuro Exam Worsens  -Salt Tabs 2 gram po BID with meals added by Neurology and checking Sodium Levels q6h given Salt Wasting syndrome  -Neurology also gave 23.4% Sodium Chloride again yesterday but no plans to give it today.   Right Vertebral Artery Dissection -As Above -C/w ASA 325 mg po Daily, Plavix 75 mg po Daily, and Atorvastatin 80 mg po qHS -Neuro repeated Head CT and findings as above -C/w Telemetry Monitoring -Neurology started 3% NS at 50 mL/hr and increased it to 75 mL/hr and will continue for the next few days   Acute Headache -Improving -C/w Acetaminophen and Fioricet PRN  HTN -Allow permissive  HTN but Goal changed given instability overnight  -Treat BP only if >180/120, and then with goal of 15% reduction  -Lisinopril Restarted by Neurology  -Patient has not been taking any medications for BP.   -Lisinopril and Metoprolol are both available on the $4 [facility] list and would be excellent options for affordability at D/C, but there are a multitude of other medications also available on the list at [facility]. -Continue To Monitor closely   HLD -Lipid Panel showed Cholesterol of 141, HDL of 27, LDL of 63, TG of 253, and VLDL 51 -Patient now on Atorvastatin 80 mg po Daily  -Review of Home Medication list shows patient is supposed to be on Gemfibrozil 600 mg po BID    Diabetes Mellitus Type 2  -Patient's A1c is 7.7 indicating at least some compliance with medications and/or dietary strategies (at least compared to prior).  -He was previously prescribed Glucophage 1000 mg BID, and this medication is available for $4/month or $10/3 months at [facility]. -Review of Home Medication list also shows patient was supposed to be on Glimepiride 4 mg po daily but not taking -C/w Senistive Novolog SSI AC -CBG's ranging from 152-190  Tobacco Abuse/Dependence -Smoking Cessation Counseling given. This was discussed with the patient and should be reviewed on an ongoing basis.   -Ordered Nicotine 14 mg TD Patch .  GERD  -Ordered Protonix IV BID on admission and will change to po Protonix 40 mg BID today  -Omeprazole 20 or 40 mg is available on the [employer] list for $9 for 30 capsules or $24 for 90 capsules.  CAD -Hx of Stenting -ASA 325  mg po Daily, Clopidogrel 75 mg po daily, and C/w Morphine 2 mg q2hprn -No BB or ACE currently to allow for Permissive HTN -ECHOCardiogram done and showed EF of 35-40% and Grade 2 DD with ? Apical Thrombus  -Repeat Limited ECHO showes no Thrombus -Will need Outpatient Cardiology Follow up when stable to D/C  Leukocytosis, improving  -Improving as WBC went from  21.2 -> 10.1 -> 11.9 -> 9.0 -> 7.8 -Continue to Monitor for S/Sx of Infection -Repeat CBC in AM   Erythrocytosis, slightly improved -Likely in the setting of Tobacco Abuse -Improving because of IVF Hydration and dilutional drop. Hb/Hct now 16.3/48.5 -Continue to Monitor  Constipation -Increased Senna Docusate 1 tab po qHS and Miralax 17 grams po Daily to BID Dosing   DVT prophylaxis: Heparin 5,000 units sq q8h Code Status: FULL CODE Family Communication: No family at Bedside Disposition Plan: D/C Home with Home Health PT/OT when medically stable   Consultants:   Neurology Stroke Team  Neurosurgery    Procedures: ECHOCARDIOGRAM ------------------------------------------------------------------- Study Conclusions  - Left ventricle: The cavity size was normal. Systolic function was   moderately reduced. The estimated ejection fraction was in the   range of 35% to 40%. Images were inadequate for LV wall motion   assessment and cannot visualize the apex adequately. Features are   consistent with a pseudonormal left ventricular filling pattern,   with concomitant abnormal relaxation and increased filling   pressure (grade 2 diastolic dysfunction). Doppler parameters are   consistent with high ventricular filling pressure. - Aortic valve: Trileaflet; normal thickness, mildly calcified   leaflets. - Mitral valve: There was trivial regurgitation. - Left atrium: The atrium was mildly dilated. - Pulmonary arteries: PA peak pressure: 34 mm Hg (S). - Pericardium, extracardiac: A trivial pericardial effusion was   identified posterior to the heart. - Recommendations: Limited study with definity contrast to assess   wall motion and rule out apical thrombus.  Recommendations:  Limited study with definity contrast to assess wall motion and rule out apical thrombus.  LIMITED ECHOCARDIOGRAM 02/22/17 ------------------------------------------------------------------- Study  Conclusions  - Technical notes: Definity to assess LV. - Left ventricle: Systolic function was mildly reduced. The   estimated ejection fraction was in the range of 45% to 50%.  Impressions:  - LVEF 45-50% - no mural thrombus.   Antimicrobials:  Anti-infectives (From admission, onward)   None     Subjective: Patient was seen and examined and denied any headache today. Nurse saying he was being belligerent this AM and agitated because he could not shower. No CP or SOB. No Nausea or vomiting and states he still has not had a bowel movement.   Objective: Vitals:   02/24/17 0800 02/24/17 0900 02/24/17 1000 02/24/17 1100  BP: (!) 190/103 (!) 158/92 (!) 170/102 (!) 166/102  Pulse: 67 63 69 63  Resp: 16 17 15 14   Temp:      TempSrc:      SpO2: 99% 100% 99% 97%  Weight:      Height:        Intake/Output Summary (Last 24 hours) at 02/24/2017 1124 Last data filed at 02/24/2017 1100 Gross per 24 hour  Intake 2160 ml  Output 2950 ml  Net -790 ml   Filed Weights   02/20/17 0501  Weight: 76.4 kg (168 lb 6.9 oz)   Examination: Physical Exam:  Constitutional: WN/WD Caucasian male in NAD but is agitated that he is not allowed to shower with CVC Eyes: Sclerae anicteric. Lids normal  ENMT: External Ears and nose appear normal Neck: Supple with no JVD Respiratory: CTAB; Unlabored breathing with no appreciable wheezing/rales/rhonchi Cardiovascular: RRR; No extremity edema noted Abdomen: Soft, NT, ND. Bowel sounds present GU: Deferred.  Musculoskeletal: No contractures; No cyanosis; Left IJ CVC Skin: Warm and Dry; No rashes or lesions on a limited skin eval Neurologic: CN 2-12 grossly intact. No appreciable focal deficits Psychiatric: Agitated mood and affect. Intact judgement and insight  Data Reviewed: I have personally reviewed following labs and imaging studies  CBC: Recent Labs  Lab 02/19/17 1118 02/20/17 0841 02/21/17 0918 02/22/17 0420 02/23/17 0520 02/24/17 0531   WBC 10.1 21.2* 10.1 11.9* 9.0 7.8  NEUTROABS 6.1  --  6.9 9.2* 6.9 5.2  HGB 17.8 17.0 15.8 17.5* 16.9 16.3  HCT 52.6* 48.4 46.9 49.8 49.7 48.5  MCV 85.9 86.3 89.0 85.9 87.3 88.2  PLT 310 271 223 236 223 240   Basic Metabolic Panel: Recent Labs  Lab 02/19/17 1118 02/20/17 0841  02/21/17 0340  02/22/17 0420  02/23/17 0520 02/23/17 1115 02/23/17 2345 02/24/17 0006 02/24/17 0531  NA 134* 139   < > 139   < > 139   < > 139 143 141 144 141  K 3.7  --   --  3.8  --  3.3*  --  3.8  --   --   --  3.8  CL 102  --   --  107  --  102  --  104  --   --   --  108  CO2 22  --   --  23  --  25  --  26  --   --   --  26  GLUCOSE 199*  --   --  145*  --  134*  --  191*  --   --   --  181*  BUN 17  --   --  10  --  9  --  10  --   --   --  8  CREATININE 1.06 0.97  --  0.93  --  0.84  --  0.80  --   --   --  0.85  CALCIUM 9.1  --   --  8.5*  --  9.1  --  8.9  --   --   --  8.9  MG 1.9  --   --   --   --  2.0  --  1.9  --   --   --  1.9  PHOS  --   --   --   --   --  3.2  --  2.9  --   --   --  3.2   < > = values in this interval not displayed.   GFR: Estimated Creatinine Clearance: 105 mL/min (by C-G formula based on SCr of 0.85 mg/dL). Liver Function Tests: Recent Labs  Lab 02/19/17 1118 02/22/17 0420 02/23/17 0520 02/24/17 0531  AST 21 19 16 17   ALT 12* 12* 13* 14*  ALKPHOS 80 81 73 72  BILITOT 0.7 1.1 1.0 1.0  PROT 7.5 6.9 6.6 6.2*  ALBUMIN 4.1 3.8 3.6 3.2*   Recent Labs  Lab 02/19/17 1118  LIPASE 50   No results for input(s): AMMONIA in the last 168 hours. Coagulation Profile: No results for input(s): INR, PROTIME in the last 168 hours. Cardiac Enzymes: Recent Labs  Lab 02/19/17 1118 02/19/17 1957  TROPONINI 0.04* 0.05*   BNP (last 3 results)  No results for input(s): PROBNP in the last 8760 hours. HbA1C: No results for input(s): HGBA1C in the last 72 hours. CBG: Recent Labs  Lab 02/23/17 0738 02/23/17 1149 02/23/17 1616 02/23/17 2136 02/24/17 0750  GLUCAP  198* 152* 190* 195* 255*   Lipid Profile: Recent Labs    02/23/17 1115  TRIG 112   Thyroid Function Tests: No results for input(s): TSH, T4TOTAL, FREET4, T3FREE, THYROIDAB in the last 72 hours. Anemia Panel: No results for input(s): VITAMINB12, FOLATE, FERRITIN, TIBC, IRON, RETICCTPCT in the last 72 hours. Sepsis Labs: Recent Labs  Lab 02/19/17 0059 02/19/17 1118 02/19/17 1957  LATICACIDVEN 1.7 2.7* 0.9    Recent Results (from the past 240 hour(s))  MRSA PCR Screening     Status: None   Collection Time: 02/20/17  5:30 AM  Result Value Ref Range Status   MRSA by PCR NEGATIVE NEGATIVE Final    Comment:        The GeneXpert MRSA Assay (FDA approved for NASAL specimens only), is one component of a comprehensive MRSA colonization surveillance program. It is not intended to diagnose MRSA infection nor to guide or monitor treatment for MRSA infections.     Radiology Studies: Ct Head Wo Contrast  Result Date: 02/23/2017 CLINICAL DATA:  Stroke follow-up. EXAM: CT HEAD WITHOUT CONTRAST TECHNIQUE: Contiguous axial images were obtained from the base of the skull through the vertex without intravenous contrast. COMPARISON:  02/22/2017 FINDINGS: Brain: Right PICA territory infarct is again noted with unchanged edema and associated mass effect. Mild leftward midline shift in the posterior fossa is unchanged, as is partial effacement of the fourth ventricle and quadrigeminal cistern. The lateral and third ventricles are unchanged in size and remain mildly dilated compared to the CT at the time of presentation on 02/19/2017. There is no evidence of new infarct, intracranial hemorrhage, or extra-axial fluid collection. A chronic lacunar infarct is again noted in the posterior right internal capsule/corona radiata. Vascular: No hyperdense vessel. Skull: No fracture or focal osseous lesion. Sinuses/Orbits: Visualized paranasal sinuses and mastoid air cells are clear. Orbits are unremarkable.  Other: None. IMPRESSION: 1. Unchanged right PICA infarct and associated posterior fossa mass effect. Unchanged mild hydrocephalus. 2. No new intracranial abnormality. Electronically Signed   By: Sebastian Ache M.D.   On: 02/23/2017 08:29   Dg Chest Port 1 View  Result Date: 02/22/2017 CLINICAL DATA:  Central line placement EXAM: PORTABLE CHEST 1 VIEW COMPARISON:  02/20/2017 FINDINGS: 1527 hours. Left IJ central line is new in the interval with tip overlying the SVC/RA junction. No evidence for left-sided pneumothorax. Lungs clear. The cardiopericardial silhouette is within normal limits for size. The visualized bony structures of the thorax are intact. Telemetry leads overlie the chest. IMPRESSION: Left IJ central line tip overlies the SVC/RA junction. No evidence for pneumothorax. Electronically Signed   By: Kennith Center M.D.   On: 02/22/2017 15:35   Scheduled Meds: .  stroke: mapping our early stages of recovery book   Does not apply Once  . aspirin EC  325 mg Oral Daily  . atorvastatin  80 mg Oral q1800  . Chlorhexidine Gluconate Cloth  6 each Topical Q0600  . clopidogrel  75 mg Oral Daily  . heparin  5,000 Units Subcutaneous Q8H  . insulin aspart  0-9 Units Subcutaneous TID WC  . lisinopril  20 mg Oral BID  . pantoprazole  40 mg Oral BID  . polyethylene glycol  17 g Oral Daily  . potassium chloride  40  mEq Oral BID  . senna-docusate  1 tablet Oral QHS  . sodium chloride flush  10-40 mL Intracatheter Q12H  . sodium chloride  2 g Oral BID WC   Continuous Infusions: . clevidipine Stopped (02/23/17 0900)  . sodium chloride (hypertonic) 75 mL/hr (02/24/17 0531)    LOS: 4 days   Merlene Laughter, DO [facility] Pager [phone]  If 7PM-7AM, please contact night-coverage [email or web address] Password [facility] 02/24/2017, 11:24 AM

## 2017-02-24 NOTE — Progress Notes (Signed)
Occupational Therapy Evaluation Patient Details Name: Kenneth [name] MRN: [id number] DOB: 03/08/1965 Today's Date: 02/24/2017    History of Present Illness Pt is a 52 y.o. male presenting with intractable nausea and vomiting and gait imbalance with neck pain x 1 week. 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  PMH includes HTN, DM, CAD.    Clinical Impression   Evaluation limited by pt's complaints of a headache. PTA, pt was independent with mobility and ADL and worked in Haematologistconstruction operating heavy machinery. Pt presents with below listed deficits and currently requires min A for mobility and ADL. If pt progresses with mobility, feel pt will benefit more from neuro outpt OT to facilitate return to independence with IADL tasks and safe return to work. Will follow acutely to address established goals and facilitate safe DC home.     Follow Up Recommendations  Supervision/Assistance - 24 hour;Home health OT(initially; may progress to follow up with neuro outpt center)    Equipment Recommendations  None recommended by OT    Recommendations for Other Services       Precautions / Restrictions Precautions Precautions: Fall Restrictions Weight Bearing Restrictions: No      Mobility Bed Mobility      Pt declined due to pain            Transfers          pt declined due to pain       General transfer comment: minguard per PT    Balance                                           ADL either performed or assessed with clinical judgement   ADL Overall ADL's : Needs assistance/impaired Eating/Feeding: Set up   Grooming: Minimal assistance Grooming Details (indicate cue type and reason): due ro pain Upper Body Bathing: Minimal assistance;Sitting   Lower Body Bathing: Minimal assistance;Sit to/from stand   Upper Body Dressing : Minimal assistance;Sitting   Lower Body Dressing: min A               Functional mobility during ADLs:  (limited to bed level today due to pain)       Vision Baseline Vision/History: Wears glasses Wears Glasses: Reading only Additional Comments: Pt reports no change in vision - will further assess     Perception     Praxis      Pertinent Vitals/Pain Pain Assessment: 0-10 Pain Score: 8  Pain Location: headache/neck Pain Descriptors / Indicators: Headache Pain Intervention(s): Limited activity within patient's tolerance     Hand Dominance Right   Extremity/Trunk Assessment Upper Extremity Assessment Upper Extremity Assessment: LUE deficits/detail LUE Deficits / Details: sensory motor deficits with apparent ataxia LUE Coordination: decreased fine motor;decreased gross motor   Lower Extremity Assessment Lower Extremity Assessment: Defer to PT evaluation   Cervical / Trunk Assessment Cervical / Trunk Assessment: Normal   Communication Communication Communication: No difficulties   Cognition Arousal/Alertness: Awake/alert Behavior During Therapy: Impulsive Overall Cognitive Status: Impaired/Different from baseline Area of Impairment: Safety/judgement;Problem solving;Attention;Awareness                       Following Commands: Follows one step commands consistently Safety/Judgement: Decreased awareness of safety;Decreased awareness of deficits Awareness: Emergent Problem Solving: Requires verbal cues     General Comments       Exercises  Shoulder Instructions      Home Living Family/patient expects to be discharged to:: Private residence Living Arrangements: Spouse/significant other Available Help at Discharge: Family Type of Home: House Home Access: Stairs to enter Secretary/administrator of Steps: 2 Entrance Stairs-Rails: None Home Layout: Two level;Bed/bath upstairs     Bathroom Shower/Tub: Chief Strategy Officer: Standard Bathroom Accessibility: Yes How Accessible: Accessible via walker Home Equipment: Emergency planning/management officer - 2  wheels   Additional Comments: works in Holiday representative; drives  Lives With: Spouse    Prior Functioning/Environment Level of Independence: Independent        Comments: Pt independent with all mobility and ADLs, driving and works full time in Holiday representative.         OT Problem List: Decreased activity tolerance;Impaired balance (sitting and/or standing);Decreased coordination;Decreased safety awareness;Decreased cognition;Decreased knowledge of use of DME or AE;Pain;Impaired UE functional use      OT Treatment/Interventions: Self-care/ADL training;Therapeutic exercise;DME and/or AE instruction;Therapeutic activities;Cognitive remediation/compensation;Patient/family education;Balance training    OT Goals(Current goals can be found in the care plan section) Acute Rehab OT Goals Patient Stated Goal: to go home OT Goal Formulation: With patient Time For Goal Achievement: 03/10/17 Potential to Achieve Goals: Good  OT Frequency: Min 3X/week   Barriers to D/C:            Co-evaluation              AM-PAC PT "6 Clicks" Daily Activity     Outcome Measure Help from another person eating meals?: None Help from another person taking care of personal grooming?: A Little Help from another person toileting, which includes using toliet, bedpan, or urinal?: A Little Help from another person bathing (including washing, rinsing, drying)?: A Little Help from another person to put on and taking off regular upper body clothing?: A Little Help from another person to put on and taking off regular lower body clothing?: A Little 6 Click Score: 19   End of Session Nurse Communication: Mobility status  Activity Tolerance: Patient limited by pain Patient left: in bed;with call bell/phone within reach;with bed alarm set  OT Visit Diagnosis: Unsteadiness on feet (R26.81);Muscle weakness (generalized) (M62.81);Pain;Other symptoms and signs involving cognitive function Pain - part of body: (head)                 Time: 1610-9604 OT Time Calculation (min): 17 min Charges:  OT General Charges $OT Visit: 1 Visit OT Evaluation $OT Eval Moderate Complexity: 1 Mod G-Codes:     Kenneth [name], OT/L  [phone] 02/24/2017  Kassadi Presswood,HILLARY 02/24/2017, 3:10 PM

## 2017-02-24 NOTE — Progress Notes (Signed)
Results for Jonell CluckCOBB, Ruble R (MRN [id number]) as of 02/24/2017 01:01  Ref. Range 02/24/2017 00:06  Sodium Latest Ref Range: 135 - 145 mmol/L 144  Discussed sodium result with Dr Otelia LimesLindzen. We will recheck sodium in 6 hrs .

## 2017-02-25 ENCOUNTER — Inpatient Hospital Stay (HOSPITAL_COMMUNITY): Payer: Self-pay

## 2017-02-25 LAB — GLUCOSE, CAPILLARY
GLUCOSE-CAPILLARY: 138 mg/dL — AB (ref 65–99)
GLUCOSE-CAPILLARY: 112 mg/dL — AB (ref 65–99)
Glucose-Capillary: 237 mg/dL — ABNORMAL HIGH (ref 65–99)
Glucose-Capillary: 180 mg/dL — ABNORMAL HIGH (ref 65–99)

## 2017-02-25 LAB — CBC WITH DIFFERENTIAL/PLATELET
Basophils Absolute: 0 10*3/uL (ref 0.0–0.1)
Basophils Relative: 1 %
Eosinophils Absolute: 0.2 10*3/uL (ref 0.0–0.7)
Eosinophils Relative: 2 %
HEMATOCRIT: 47 % (ref 39.0–52.0)
HEMOGLOBIN: 15.8 g/dL (ref 13.0–17.0)
LYMPHS ABS: 2.5 10*3/uL (ref 0.7–4.0)
Lymphocytes Relative: 33 %
MCH: 29.6 pg (ref 26.0–34.0)
MCHC: 33.6 g/dL (ref 30.0–36.0)
MCV: 88.2 fL (ref 78.0–100.0)
MONOS PCT: 5 %
Monocytes Absolute: 0.4 10*3/uL (ref 0.1–1.0)
NEUTROS ABS: 4.6 10*3/uL (ref 1.7–7.7)
NEUTROS PCT: 59 %
Platelets: 237 10*3/uL (ref 150–400)
RBC: 5.33 MIL/uL (ref 4.22–5.81)
RDW: 13.3 % (ref 11.5–15.5)
WBC: 7.8 10*3/uL (ref 4.0–10.5)

## 2017-02-25 LAB — COMPREHENSIVE METABOLIC PANEL
ALBUMIN: 3.2 g/dL — AB (ref 3.5–5.0)
ALK PHOS: 69 U/L (ref 38–126)
ALT: 15 U/L — ABNORMAL LOW (ref 17–63)
ANION GAP: 7 (ref 5–15)
AST: 19 U/L (ref 15–41)
BILIRUBIN TOTAL: 0.9 mg/dL (ref 0.3–1.2)
BUN: 8 mg/dL (ref 6–20)
CALCIUM: 8.8 mg/dL — AB (ref 8.9–10.3)
CO2: 25 mmol/L (ref 22–32)
CREATININE: 0.86 mg/dL (ref 0.61–1.24)
Chloride: 108 mmol/L (ref 101–111)
GFR calc Af Amer: >60 mL/min (ref >60)
GFR calc non Af Amer: >60 mL/min (ref >60)
GLUCOSE: 160 mg/dL — AB (ref 65–99)
Potassium: 4.1 mmol/L (ref 3.5–5.1)
Sodium: 140 mmol/L (ref 135–145)
TOTAL PROTEIN: 5.9 g/dL — AB (ref 6.5–8.1)

## 2017-02-25 LAB — MAGNESIUM: Magnesium: 1.8 mg/dL (ref 1.7–2.4)

## 2017-02-25 LAB — SODIUM: SODIUM: 140 mmol/L (ref 135–145)

## 2017-02-25 LAB — PHOSPHORUS: Phosphorus: 3.6 mg/dL (ref 2.5–4.6)

## 2017-02-25 NOTE — Progress Notes (Signed)
  Speech Language Pathology Treatment: Cognitive-Linquistic  Patient Details Name: Kenneth CluckJimmy R [name] MRN: [id number] DOB: 03/08/1965 Today's Date: 02/25/2017 Time: 1335-1400 SLP Time Calculation (min) (ACUTE ONLY): 25 min  Assessment / Plan / Recommendation Clinical Impression  Pt seen at bedside for cognitive treatment. The Montreal Cognitive Assessment (MoCA) was administered for diagnostic treatment. Pt scored 27/30 (n=26+/30), indicating performance within normal limits for pt age and education (10th grade per pt report), however, CCAS scale (administered 02/20/17) performance raised concern for high level cognitive impairments, including alternating (and likely divided) attention, new learning, and complex problem solving.  Pt responses today were impulsive and given with rapid rate of speech, which his wife (present during this session) reports is normal for pt. Points were lost on MoCA on abstract reasoning and delayed recall, which falls in line with CCAS results. Pt verbalizes no awareness of any functional deficits resulting from this stroke, indicating the possibility of executive function impairment.  Pt kept his eyes closed during the entire session, and frequently responded with sarcasm, "kidding around", and "giving people a hard time whenever I can". While this may be baseline behavior, concern for high level cognitive and executive function deficits persists.   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    HPI HPI: Pt is a 51 y.o.malepresenting with intractable nausea and vomitingand gait imbalance with neck pain x 1 week. 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 PMH includes HTN, DM, CAD.      SLP Plan  Continue with current plan of care       Recommendations   home health or outpatient ST                Follow up Recommendations: Home health or outpatient SLP SLP Visit Diagnosis: Cognitive communication deficit (W09.811(R41.841) Plan: Continue with current plan of care       GO              Kenneth [name], [facility] Regional Medical CenterMSP, CCC-SLP Speech Language Pathologist [phone]  Kenneth [name], Kenneth [name] 02/25/2017, 1:59 PM

## 2017-02-25 NOTE — Progress Notes (Signed)
NEUROHOSPITALISTS STROKE TEAM - DAILY PROGRESS NOTE   SUBJECTIVE (INTERVAL HISTORY) RN is at the bedside. Pt Na 140 this am. Denies HA, N/V. Asking for shower and wash hair, but I told him to wait a couple of days until central line is out. He is not happy but Ok with it.  Repeat CT today shows stable appearance of cytotoxic edema and mild mass effect on fourth ventricle but without any increase in hydrocephalus Blood pressure is adequately controlled    OBJECTIVE Lab Results: CBC:  Recent Labs  Lab 02/23/17 0520 02/24/17 0531 02/25/17 0411  WBC 9.0 7.8 7.8  HGB 16.9 16.3 15.8  HCT 49.7 48.5 47.0  MCV 87.3 88.2 88.2  PLT 223 240 237   BMP: Recent Labs  Lab 02/19/17 1118  02/21/17 0340  02/22/17 0420  02/23/17 0520  02/24/17 0531 02/24/17 1128 02/24/17 1714 02/24/17 2315 02/25/17 0411  NA 134*   < > 139   < > 139   < > 139   < > 141 141 140 141 140  K 3.7  --  3.8  --  3.3*  --  3.8  --  3.8  --   --   --  4.1  CL 102  --  107  --  102  --  104  --  108  --   --   --  108  CO2 22  --  23  --  25  --  26  --  26  --   --   --  25  GLUCOSE 199*  --  145*  --  134*  --  191*  --  181*  --   --   --  160*  BUN 17  --  10  --  9  --  10  --  8  --   --   --  8  CREATININE 1.06   < > 0.93  --  0.84  --  0.80  --  0.85  --   --   --  0.86  CALCIUM 9.1  --  8.5*  --  9.1  --  8.9  --  8.9  --   --   --  8.8*  MG 1.9  --   --   --  2.0  --  1.9  --  1.9  --   --   --  1.8  PHOS  --   --   --   --  3.2  --  2.9  --  3.2  --   --   --  3.6   < > = values in this interval not displayed.   Liver Function Tests:  Recent Labs  Lab 02/19/17 1118 02/22/17 0420 02/23/17 0520 02/24/17 0531 02/25/17 0411  AST 21 19 16 17 19   ALT 12* 12* 13* 14* 15*  ALKPHOS 80 81 73 72 69  BILITOT 0.7 1.1 1.0 1.0 0.9  PROT 7.5 6.9 6.6 6.2* 5.9*  ALBUMIN 4.1 3.8 3.6 3.2* 3.2*   Recent Labs  Lab 02/19/17 1118  LIPASE 50   Thyroid  Function Studies:  No results for input(s): TSH, T4TOTAL, T3FREE, THYROIDAB in the last 72 hours. Cardiac Enzymes:  Recent Labs  Lab 02/19/17 1118 02/19/17 1957  TROPONINI 0.04* 0.05*   Urine Drug Screen:     Component Value Date/Time   LABOPIA POSITIVE (A) 02/20/2017 1600   COCAINSCRNUR NONE DETECTED 02/20/2017 1600   COCAINSCRNUR NONE DETECTED  12/29/2015 0823   LABBENZ POSITIVE (A) 02/20/2017 1600   AMPHETMU NONE DETECTED 02/20/2017 1600   THCU NONE DETECTED 02/20/2017 1600   LABBARB POSITIVE (A) 02/20/2017 1600    PHYSICAL EXAM Temp:  [97.7 F (36.5 C)-98.3 F (36.8 C)] 98.3 F (36.8 C) (02/04 1200) Pulse Rate:  [59-85] 76 (02/04 1200) Resp:  [11-25] 21 (02/04 1200) BP: (142-193)/(74-108) 162/83 (02/04 1200) SpO2:  [87 %-100 %] 97 % (02/04 1200) General - Well nourished, well developed, in no apparent distress HEENT-  Normocephalic, Cardiovascular - Regular rate and rhythm  Respiratory - Lungs clear bilaterally. No wheezing. Abdomen - soft and non-tender, BS normal Extremities- no edema or cyanosis Neurological Examination Mental Status: Alert, oriented, thought content appropriate.  Speech fluent without evidence of aphasia. Able to follow 3 step commands without difficulty. Cranial Nerves: II: Visual fields grossly normal,  III,IV, VI: ptosis not present, extra-ocular motions intact bilaterally, pupils equal, round, reactive to light and accommodation V,VII: smile symmetric, facial light touch sensation normal bilaterally VIII: hearing normal bilaterally IX,X: uvula rises symmetrically XI: bilateral shoulder shrug XII: midline tongue extension Motor: Right : Upper extremity   5/5    Left:     Upper extremity   5/5  Lower extremity   5/5     Lower extremity   5/5 Tone and bulk:normal tone throughout; no atrophy noted Sensory: Pinprick and light touch intact throughout, bilaterally Deep Tendon Reflexes: 2+ and symmetric throughout Plantars: Right:  downgoing   Left: downgoing Cerebellar: normal finger-to-nose, normal rapid alternating movements and normal heel-to-shin test Gait: not tested  IMAGING: I have personally reviewed the radiological images below and agree with the radiology interpretations.  Mr Maxine GlennMra Neck W Wo Contrast Result Date: 02/20/2017 IMPRESSION: 1. 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 The vessel appears to be reconstituted in the distal V2 segment. 2. Negative MRI appearance of the left vertebral artery and bilateral cervical carotid arteries. Electronically Signed   By: Odessa FlemingH  Hall M.D.   On: 02/20/2017 12:54   Mr Brain Wo Contrast  Result Date: 02/19/2017 IMPRESSION: 1. 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 2. No hydrocephalus. 3. Small remote right corona radiata infarct. Electronically Signed   By: Marnee SpringJonathon  Watts M.D.   On: 02/19/2017 15:51   Ct Angio Head/ Neck W Or Wo Contrast Result Date: 02/19/2017  IMPRESSION: 1. The right vertebral artery is abnormal throughout its course. 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 2. Unfortunately the distal right vertebral artery is dominant. The left vertebral is normal but diminutive beyond the left PICA. Still, the vertebrobasilar junction, basilar artery, and other posterior circulation remain patent. 3. Stable CT appearance of the brain since 1156 hours today with no acute infarct evident. 4. Minimal carotid atherosclerosis, no carotid stenosis, and otherwise negative anterior circulation. Electronically Signed   By: Odessa FlemingH  Hall M.D.   On: 02/19/2017 13:51   Ct Head Wo Contrast Result Date: 02/19/2017 IMPRESSION: 1. No evidence of acute intracranial abnormality. 2. 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 Electronically Signed   By: Sebastian AcheAllen   Grady M.D.   On: 02/19/2017 12:10   Echocardiogram:  Study Conclusions  - Left ventricle: The cavity size was normal. Systolic function was   moderately reduced. The estimated ejection fraction was in the   range of 35% to 40%. Images were inadequate for LV wall motion   assessment and cannot visualize the apex adequately. Features are   consistent with a pseudonormal left ventricular filling pattern,   with concomitant abnormal relaxation and increased filling   pressure (grade 2 diastolic dysfunction). Doppler parameters are   consistent with high ventricular filling pressure. - Aortic valve: Trileaflet; normal thickness, mildly calcified   leaflets. - Mitral valve: There was trivial regurgitation. - Left atrium: The atrium was mildly dilated. - Pulmonary arteries: PA peak pressure: 34 mm Hg (S). - Pericardium, extracardiac: A trivial pericardial effusion was   identified posterior to the heart. - Recommendations: Limited study with definity contrast to assess   wall motion and rule out apical thrombus.  Echocardiogram Limited:       02-22-2017, 12:29:44            Study Conclusions - Technical notes: Definity to assess LV. - Left ventricle: Systolic function was mildly reduced. The   estimated ejection fraction was in the range of 45% to 50%. Impressions:- LVEF 45-50% - no mural thrombus.  CT HEAD WITHOUT CONTRAST 02/21/2017 22:45 IMPRESSION: 1. Evolving RIGHT posterior-inferior cerebellar artery territory infarct with increasing edema and mass effect, no hemorrhagic conversion. 2. New mild obstructive hydrocephalus.  CT HEAD WITHOUT CONTRAST  02/22/2017 03:01 IMPRESSION: 1. Stable distribution of right PICA infarction. Stable associated mass effect with partial effacement of fourth ventricle and quadrigeminal plate cistern. Stable ventricle size with mild hydrocephalus. 2. No new acute intracranial abnormality  identified.  Ct Head Wo Contrast 02/23/2017 IMPRESSION: 1. Unchanged right PICA infarct and associated posterior fossa mass effect. Unchanged mild hydrocephalus. 2. No new intracranial abnormality.      IMPRESSION:  52 year old male with history of diabetes, hypertension, smoker admitted for dizziness, nausea and vomiting.  He admitted on and off neck pain for the last 1 week.  CT head no hemorrhage.  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  MRI showed right cerebellum infarct and remote right CR infarct.  EF 35-40%, not able to rule out LV thrombosis.  TTE with contrast showed EF 45% and no mural thrombus.  LDL 63, TG 253 and A1c 7.7.  His TG much improved from 1 year ago.  Currently on aspirin, Plavix and Lipitor.  Patient had a stroke 12/2015 with left upper extremity numbness, gait imbalance and left-sided coordination difficulty.  MRI showed right BG/CR infarct.  Carotid Doppler negative.  EF 30-35%.  He has been following with cardiology in [facility] clinic.  TG 2631, A1c 9.5.  He is not compliant with medication at home.  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  Suspected Etiology: dissection vs athero vs cardioembolic Resultant Symptoms: Ataxia, Neck Pain, H/A, Nausea Stroke Risk Factors: diabetes mellitus, hyperlipidemia, hypertension and smoking Other Stroke Risk Factors: Advanced age, Hx stroke, CAD  PLAN  02/25/2017: Continue Aspirin/ Plavix/Statin Repeat Head CT STAT if Neuro exam acutely worsens Frequent neuro checks Telemetry monitoring PT/OT/SLP Consult PM & Rehab Consult Case Management /MSW Ongoing aggressive stroke risk factor management Patient counseled to be compliant with his antithrombotic medications Patient counseled on Lifestyle modifications including, Diet, Exercise, and Stress Follow up with [facility]  in 6 weeks  HX OF STROKES: Small remote right corona radiata infarct  Headache Continue Fioricet PRN  Cerebral Edema  Moderate size cerebellar stroke, Repeat CT shows Mild hydrocephalus but stable,  NA 140 today, Levels every 6 hrs, continue 3% saline.  NS consulted and no need for surgery at this time, Appreciate assistance Central line placement by CCM - Appreciate assistanace Obtain STAT if Neuro exam acutely worsens  Leukocytosis  - resolved Remains afebrile, CXR 1/30 Negative Repeat labs in AM  Hypokalemia  Replacement in progress Repeat labs in AM  HYPERTENSION: Unstable, some elevated B/P's noted overnight Permissive hypertension - SBP Goal now less than 180 Labetalol PRN Long term BP goal normotensive. Home Meds: Lisinopril restarted 02/22/2017  HYPERLIPIDEMIA:    Component Value Date/Time   CHOL 141 02/21/2017 0340   CHOL 174 07/30/2014 0902   TRIG 112 02/23/2017 1115   HDL 27 (L) 02/21/2017 0340   HDL 31 (L) 07/30/2014 0902   CHOLHDL 5.2 02/21/2017 0340   VLDL 51 (H) 02/21/2017 0340   LDLCALC 63 02/21/2017 0340   LDLCALC 111 (H) 07/30/2014 0902  Home Meds:  Lipitor 80 mg LDL  goal < 70 Continued on Lipitor to 80 mg daily Continue statin at discharge  DIABETES: Lab Results  Component Value Date   HGBA1C 7.7 (H) 02/19/2017  HgbA1c goal < 7.0 Currently on: Novolog Continue CBG monitoring and SSI to maintain glucose 140-180 mg/dl DM education   TOBACCO ABUSE Current smoker Smoking cessation counseling provided Nicotine patch provided  Other Active Problems: Active Problems:   Stroke (cerebrum) (HCC)   Ischemic stroke (HCC)   Type 2 diabetes mellitus with hyperlipidemia (HCC)   Uncontrolled type 2 diabetes mellitus with hyperglycemia (HCC)   Tobacco dependence   Vertebral artery dissection (HCC)   Obstructive hydrocephalus   Encounter for central line placement    Hospital day # 5 VTE prophylaxis:  Heparin Diet : Fall precautions Diet  Heart Room service appropriate? Yes; Fluid consistency: Thin   FAMILY UPDATES:  family at bedside  TEAM UPDATES: Merlene Laughter, DO   Prior Home Stroke Medications:  aspirin 81 mg daily  Discharge Stroke Meds:  Please discharge patient on aspirin 325 mg daily   Disposition: [facility] Therapy Recs:               PENDING Follow Up:  Follow-up Information    [facility] Follow up on 03/04/2017.   Why:  9 am for hospital follow up Contact information: [address] [id number] mc [place] [address] [phone]       Nilda Riggs, NP. Schedule an appointment as soon as possible for a visit in 6 week(s).   Specialty:  Family Medicine Contact information: [address] [place] [address] [phone]          Anola Gurney, [place] -PCP Follow up in 1-2 weeks      Assessment & plan discussed with with attending physician and they are in agreement.       ATTENDING NOTE: I reviewed above note and agree with the assessment and plan. I have made any additions or clarifications directly to the above note. Pt was seen and examined.   52 year old male with history of diabetes, hypertension, smoker admitted for dizziness, nausea and vomiting.  He admitted on and off neck pain for the last 1 week.  CT head no hemorrhage.  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  MRI showed right cerebellum infarct and remote right CR infarct.  EF 35-40%, not able to rule out LV thrombosis. Had TTE with contrast showed EF 45-50% and no mural thrombus.  LDL 63, TG 253 and A1c 7.7.  His TG much improved from 1 year ago.  Currently on aspirin, Plavix and Lipitor.  Patient had a stroke 12/2015 with left upper extremity numbness, gait imbalance and left-sided coordination difficulty.  MRI showed right BG/CR infarct.  Carotid  Doppler negative.  EF 30-35%.  He has been following with cardiology in [place] clinic.  TG 2631, A1c 9.5.  He is not compliant with medication at home.  Patient 02/21/17 complained of severe headache with nausea vomiting.  Repeat CT showed mild hydrocephalus. Put on 3% saline and transfer to ICU. NSG Dr. Lovell Sheehan contacted and no EVD at this time will continue monitoring. Pt HA improved and no more N/V. Repeat CT today stable mild hydrocephalus. Pt doing well. Na 139->143. Urine Na high at 230, consistent with salt wasting syndrome. Pt neurologically stable and symptoms free, will not chasing numbers.  Taper hypertonic saline over the next 24 hours. Long discussion with patient and Dr. Debby Bud This patient is critically ill and at significant risk of neurological worsening, death and care requires constant monitoring of vital signs, hemodynamics,respiratory and cardiac monitoring, extensive review of multiple databases, frequent neurological assessment, discussion with family, other specialists and medical decision making of high complexity.I have made any additions or clarifications directly to the above note.This critical care time does not reflect procedure time, or teaching time or supervisory time of PA/NP/Med Resident etc but could involve care discussion time.  I spent 30 minutes of neurocritical care time  in the care of  this patient.     Delia Heady, MD Stroke Neurology 02/25/2017 2:25 PM   This patient is critically ill due to right cerebellar infarct, obstructive hydrocephalus, right VA dissection, cardiomyopathy and at significant risk of neurological worsening, death form recurrent stroke, hemorrhagic conversion, hydrocephalus, brain herniation, cerebral edema, heart failure. This patient's care requires constant monitoring of vital signs, hemodynamics, respiratory and cardiac monitoring, review of multiple databases, neurological assessment, discussion with family, other specialists  and medical decision making of high complexity. I spent 35 minutes of neurocritical care time in the care of this patient.   To contact Stroke Continuity provider, please refer to [email or web address]. After hours, contact General Neurology

## 2017-02-25 NOTE — Progress Notes (Signed)
Occupational Therapy Treatment Patient Details Name: Kenneth [name] MRN: [id number] DOB: 07-12-1965 Today's Date: 02/25/2017    History of present illness Pt is a 52 y.o. male presenting with intractable nausea and vomiting and gait imbalance with neck pain x 1 week. 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  PMH includes HTN, DM, CAD.    OT comments  Pt progressing towards established OT goals. Pt performing grooming at sink with supervision for safety due to decreased standing balance and impulsivity. Pt requiring Min A for functional mobility due to decreased balance. Pt continues to present with decreased safety awareness, problem solving, and attention. Will continue to follow acutely to facilitate safe dc and address cognition and safety during ADLs. Update dc recommendation to home with OP neuro OT to optimize safety during ADLs and IADLs.    Follow Up Recommendations  Supervision/Assistance - 24 hour;Outpatient OT(Neuro OP OT)    Equipment Recommendations  None recommended by OT    Recommendations for Other Services      Precautions / Restrictions Precautions Precautions: Fall Restrictions Weight Bearing Restrictions: No       Mobility Bed Mobility Overal bed mobility: Independent                Transfers Overall transfer level: Needs assistance Equipment used: Rolling walker (2 wheeled) Transfers: Sit to/from Stand Sit to Stand: Supervision         General transfer comment: supervision for lines and safety    Balance Overall balance assessment: Needs assistance Sitting-balance support: No upper extremity supported;Feet supported Sitting balance-Leahy Scale: Good     Standing balance support: Bilateral upper extremity supported;During functional activity Standing balance-Leahy Scale: Fair Standing balance comment: relies on UE support for balance Single Leg Stance - Right Leg: 0 Single Leg Stance - Left Leg: 5     Rhomberg - Eyes Opened:  120     High Level Balance Comments: continues to have LOB with narrowed BOS and SLS           ADL either performed or assessed with clinical judgement   ADL Overall ADL's : Needs assistance/impaired     Grooming: Supervision/safety;Set up;Oral care;Standing Grooming Details (indicate cue type and reason): Close supervision for safety at sink due to decreased balance and impulsivity.                   Toilet Transfer Details (indicate cue type and reason): Min Guard for safety while urinating at toilet Toileting- Architect and Hygiene: Min guard;Sit to/from stand Toileting - Clothing Manipulation Details (indicate cue type and reason): MIn Guard for safety     Functional mobility during ADLs: Minimal assistance General ADL Comments: Pt performing grooming at sink and required supervision for balance and impulsivity. Pt requiring Min A for functional mobility.      Vision       Perception     Praxis      Cognition Arousal/Alertness: Awake/alert Behavior During Therapy: Impulsive Overall Cognitive Status: Impaired/Different from baseline Area of Impairment: Problem solving;Safety/judgement;Following commands;Awareness                       Following Commands: Follows one step commands consistently;Follows multi-step commands with increased time Safety/Judgement: Decreased awareness of safety;Decreased awareness of deficits Awareness: Emergent Problem Solving: Slow processing;Requires verbal cues General Comments: Pt with impulsivity and decreased safety awarness. Rquiring cues to wait for therapist to managelines before mobility. Pt repeating he will not use toiletries provided  by hospital and is waiting for wife to bring soap and shampoo. Pt joking throughout session.        Exercises     Shoulder Instructions       General Comments Wife arriving at end of session. Discussed with her safety during ADLs and need for RN/NT during  mobility around room    Pertinent Vitals/ Pain       Pain Assessment: No/denies pain  Home Living                                          Prior Functioning/Environment              Frequency  Min 3X/week        Progress Toward Goals  OT Goals(current goals can now be found in the care plan section)  Progress towards OT goals: Progressing toward goals  Acute Rehab OT Goals Patient Stated Goal: to go home OT Goal Formulation: With patient Time For Goal Achievement: 03/10/17 Potential to Achieve Goals: Good ADL Goals Pt Will Perform Upper Body Bathing: with modified independence;sitting Pt Will Perform Lower Body Bathing: with modified independence;sit to/from stand Pt Will Perform Upper Body Dressing: with modified independence;sitting Pt Will Perform Lower Body Dressing: with modified independence;sit to/from stand Pt Will Transfer to Toilet: with modified independence;ambulating  Plan Discharge plan needs to be updated    Co-evaluation                 AM-PAC PT "6 Clicks" Daily Activity     Outcome Measure   Help from another person eating meals?: None Help from another person taking care of personal grooming?: A Little Help from another person toileting, which includes using toliet, bedpan, or urinal?: A Little Help from another person bathing (including washing, rinsing, drying)?: A Little Help from another person to put on and taking off regular upper body clothing?: A Little Help from another person to put on and taking off regular lower body clothing?: A Little 6 Click Score: 19    End of Session    OT Visit Diagnosis: Unsteadiness on feet (R26.81);Muscle weakness (generalized) (M62.81);Pain;Other symptoms and signs involving cognitive function   Activity Tolerance Patient tolerated treatment well   Patient Left in bed;with call bell/phone within reach;with bed alarm set   Nurse Communication Mobility status         Time: [id number] OT Time Calculation (min): 18 min  Charges: OT General Charges $OT Visit: 1 Visit OT Treatments $Self Care/Home Management : 8-22 mins  Kenneth [name] MSOT, [name] Acute Rehab Pager: [phone] Office: [phone]   Kenneth GristCharis M Aymara [name] 02/25/2017, 1:45 PM

## 2017-02-25 NOTE — Progress Notes (Signed)
Physical Therapy Treatment Patient Details Name: Kenneth [name] MRN: [id number] DOB: 07/27/1965 Today's Date: 02/25/2017    History of Present Illness Pt is a 52 y.o. male presenting with intractable nausea and vomiting and gait imbalance with neck pain x 1 week. 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  PMH includes HTN, DM, CAD.     PT Comments    Pt moving well and needs encouragement to participate. Pt joking throughout session and benefits from direction and clear expectations to participate. Pt prefers to have eyes closed due to dizziness but able to maintain open for gait and activity. Pt able to verbalize deficits in balance and function and fatigued end of session. Pt eager to return to baseline and tends to cover deficits and difficulty with sarcasm and joking. Will continue to follow.     Follow Up Recommendations  Outpatient PT;Supervision/Assistance - 24 hour     Equipment Recommendations  Rolling walker with 5" wheels    Recommendations for Other Services       Precautions / Restrictions Precautions Precautions: Fall    Mobility  Bed Mobility Overal bed mobility: Independent                Transfers Overall transfer level: Needs assistance     Sit to Stand: Supervision         General transfer comment: supervision for lines and safety  Ambulation/Gait Ambulation/Gait assistance: Min assist;Min guard Ambulation Distance (Feet): 400 Feet Assistive device: Rolling walker (2 wheeled);None Gait Pattern/deviations: Step-through pattern;Decreased stride length   Gait velocity interpretation: Below normal speed for age/gender General Gait Details: pt walked 300' without AD with initial 200' using IV pole to stabilize with assist for midline posture and cues with slow gait, 100' with no AD with pt pushing into PT with LUE to balance due to left lean . additional 100' with RW with cues for posture and position in RW   Stairs             Wheelchair Mobility    Modified Rankin (Stroke Patients Only) Modified Rankin (Stroke Patients Only) Pre-Morbid Rankin Score: No symptoms Modified Rankin: Moderate disability     Balance Overall balance assessment: Needs assistance Sitting-balance support: No upper extremity supported;Feet supported Sitting balance-Leahy Scale: Good       Standing balance-Leahy Scale: Fair Standing balance comment: relies on UE support for balance Single Leg Stance - Right Leg: 0 Single Leg Stance - Left Leg: 5     Rhomberg - Eyes Opened: 120     High Level Balance Comments: continues to have LOB with narrowed BOS and SLS            Cognition Arousal/Alertness: Awake/alert Behavior During Therapy: Impulsive Overall Cognitive Status: Impaired/Different from baseline Area of Impairment: Problem solving;Safety/judgement                       Following Commands: Follows one step commands consistently Safety/Judgement: Decreased awareness of safety;Decreased awareness of deficits            Exercises      General Comments        Pertinent Vitals/Pain Pain Assessment: No/denies pain    Home Living                      Prior Function            PT Goals (current goals can now be found in the care plan section)  Progress towards PT goals: Progressing toward goals    Frequency           PT Plan Discharge plan needs to be updated    Co-evaluation              AM-PAC PT "6 Clicks" Daily Activity  Outcome Measure  Difficulty turning over in bed (including adjusting bedclothes, sheets and blankets)?: None Difficulty moving from lying on back to sitting on the side of the bed? : None Difficulty sitting down on and standing up from a chair with arms (e.g., wheelchair, bedside commode, etc,.)?: None Help needed moving to and from a bed to chair (including a wheelchair)?: A Little Help needed walking in hospital room?: A Little Help needed  climbing 3-5 steps with a railing? : A Little 6 Click Score: 21    End of Session Equipment Utilized During Treatment: Gait belt Activity Tolerance: Patient tolerated treatment well Patient left: in chair;with chair alarm set;with family/visitor present;with call bell/phone within reach Nurse Communication: Mobility status PT Visit Diagnosis: Other abnormalities of gait and mobility (R26.89);Unsteadiness on feet (R26.81)     Time: 1308-6578 PT Time Calculation (min) (ACUTE ONLY): 40 min  Charges:  $Gait Training: 23-37 mins $Therapeutic Activity: 8-22 mins                    G Codes:       Kenneth [name], PT [phone]    Kenneth [name] Kenneth [name] 02/25/2017, 11:36 AM

## 2017-02-25 NOTE — Progress Notes (Signed)
PROGRESS NOTE    Kenneth [name]  ZOX:[id number] DOB: 1965/11/06 DOA: 02/20/2017 PCP: Anola Gurney, PA   Brief Narrative:  Kenneth [name] is a 52 y.o. male with a Past Medical History of DM, HTN, CAD, and CVA who presents with acute CVA diagnosed at [facility].  He was transferred from [place] to [facility] overnight on the Neurology service for diagnostic cerebral angiogram. TRH was consulted and assumed care. He was found to have an acute CVA and a Vertebral Artery Dissection. Workup is currently underway and Neurology is following. A Stat Head CT was obtained last night and showed worsening mass effect mild hydrocephalus obstructive and showed evolving Right PICA Territory Infarct. Because of Concern Neurology transferred patient to ICU and started 3% NS and Neurosurgery was consulted for evaluation and have no plans for Ventricular drainage at this time. Plan is to continue Hypertonic Saline weaning. Repeat Head CT this AM showed Evolving RIGHT PICA infarct without hemorrhagic conversion. Decreasing, minimal residual hydrocephalus.  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 -CT Angio Head and Neck showed The right vertebral artery is abnormal throughout its course. The appearance combined with history of severe posterior neck pain is most compatible with Acute Right Vertebral  Artery Dissection. Unfortunately the distal right vertebral artery is dominant. The Left vertebral is normal but diminutive beyond the left PICA. Still the vertebrobasilar junction, basilar artery, and other posterior circulation remain patent. Stable CT appearance of the brain since 1156 hours today with no acute infarct evident. . Minimal carotid atherosclerosis, no carotid stenosis, and otherwise negative anterior circulation. -MRI Brain showed Acute right pica territory infarct with known underlying right vertebral dissection. No hydrocephalus. Small remote right corona radiata infarct. -MRA Neck showed Abnormal MRA appearance of the right vertebral artery compatible with proximal vertebral artery dissection and thrombosis. The vessel appears to be reconstituted in the distal V2 segment. Negative MRI appearance of the left vertebral artery and bilateral cervical carotid arteries. -ECHOCardiogram showed EF of 35-40% with Grade 2 DD and ? Apical Thrombus -TEE Cancelled after my Discussion with Cardiology Dr. Royann Shivers and he recommended Limited ECHO with Contrast -Repeat Limited ECHO showed LVEF of 45-50% and no Mural Thrombus -Head CT w/o Contrast x2 was repeated last yesterday because of Worsening Headache;   -First Head CT showed Evolving RIGHT posterior-inferior cerebellar artery territory infarct with increasing edema and mass effect, no hemorrhagic conversion. New mild obstructive hydrocephalus -Second Head CT showed Stable distribution of right PICA infarction. Stable associated mass effect with partial effacement of fourth ventricle and quadrigeminal plate cistern. Stable ventricle size with mild hydrocephalus. No new acute intracranial abnormality identified. -Repeat CT Head 02/23/17 showed Unchanged right PICA infarct and associated posterior fossa mass effect abd unchanged mild hydrocephalus. No new intracranial abnormality. -Repeat Head CT 02/25/17 showed  Evolving RIGHT PICA infarct without  hemorrhagic conversion. Decreasing, minimal residual hydrocephalus -Because of Worsening Mass effect with new mild Hydrocephalus patient was at risk  for developing Herniation so was moved to ICU overnight 1/31-2/1 and started on Hypertonic NS 3% at 50 mL/hr; IV NS 3% rate increased to 75 mL/hr and plan is to start weaning; Neurology recommending tapering by reducing infusion rate 50% q12h x2 and the discontinuing  -I discussed th case with Dr. Pearlean Brownie of Neurology who recommended a goal Na+ of 150-155 but since patient is improving want to just continue to monitor clinically  -Checking Na+ Levels q6h -Neurosurgery Ventriculostomy presently and still holding off Ventricular Drainage.  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 -Neurochecks per Protocol  -PT/OT/SLP/Nutrition Consults with continued therapy; Recommending Outpatient PT with Rolling Walker with 5" Wheels  Cerebral Edema/Mild Obstructive Hydrocephalus -As Above;  -Started on 3% NS and Triple Lumen Placed; Rate Increased to 75 mL  -Neurosurgery consulted and no Ventriculostomy to be placed at this time.  -Per Neuro STAT Head If Neuro Exam Worsens  -Salt Tabs 2 gram po BID with meals added by Neurology and checking Sodium Levels q6h given Salt Wasting syndrome  -Neurology also gave 23.4% Sodium Chloride x2 -Neurology now recommending not chasing Sodium Level as patient is stable and recommending NS 3% at 75 mL and taper off by reducing rate of 50% q12h x 2 and then discontinuing  -Repeat Head Scan shows RIGHT inferior cerebellar cytotoxic edema with slightly decreased mass  effect. Mildly effaces suprasellar cistern. Slightly dilated third ventricles and lateral ventricle. Old RIGHT corona radiata to internal capsule lacunar infarct. No intraparenchymal hemorrhage or acute large vascular territory infarcts. No abnormal extra-axial fluid collections.  Right Vertebral Artery Dissection -As Above -C/w ASA 325 mg po Daily, Plavix 75 mg po Daily, and Atorvastatin 80 mg po qHS -Neuro repeated Head CT and findings as above -C/w Telemetry Monitoring -Neurology started 3% NS at 50 mL/hr and increased it to 75 mL/hr and now recommending tapering by reducing the rate 50% q12h x 2 and then discontinuing it  Acute Headache -Improving -C/w Acetaminophen and Fioricet PRN  HTN -Allow permissive HTN but Goal changed given instability overnight  -Treat BP only if >180/120, and then with goal of 15% reduction  -Lisinopril 20 mg po BID Restarted by Neurology  -Patient has not been taking any medications for BP.   -Lisinopril and Metoprolol are both available on the $4 [facility] list and would be excellent options for affordability at D/C, but there are a multitude of other medications also available on the list at [facility]. -Continue To Monitor closely   HLD -Lipid Panel showed Cholesterol of 141, HDL of 27, LDL of 63, TG of 253, and VLDL 51 -Patient now on Atorvastatin 80 mg po Daily  -Review of Home Medication list shows patient is supposed to be on Gemfibrozil 600 mg po BID   Diabetes Mellitus Type 2  -Patient's A1c is 7.7 indicating at least some compliance with medications and/or dietary strategies (at least compared to prior).  -He was previously prescribed Glucophage 1000 mg BID, and this medication is available for $4/month or $10/3 months at [facility]. -Review of Home Medication list also shows patient was supposed to be on Glimepiride 4 mg po daily but not taking -C/w  Senistive Novolog SSI AC -CBG's ranging from 173-237  Tobacco Abuse/Dependence -Smoking  Cessation Counseling given. This was discussed with the patient and should be reviewed on an ongoing basis.   -Ordered Nicotine 14 mg TD Patch .  GERD  -Ordered Protonix IV BID on admission and changed to po Protonix 40 mg BID \ -Omeprazole 20 or 40 mg is available on the [facility] list for $9 for 30 capsules or $24 for 90 capsules.  CAD -Hx of Stenting -ASA 325 mg po Daily, Clopidogrel 75 mg po daily, and C/w Morphine 2 mg q2hprn -No BB or ACE currently to allow for Permissive HTN -ECHOCardiogram done and showed EF of 35-40% and Grade 2 DD with ? Apical Thrombus  -Repeat Limited ECHO showes no Thrombus -Will need Outpatient Cardiology Follow up when stable to D/C  Leukocytosis, improving  -Improving as WBC went from 21.2 -> 10.1 -> 11.9 -> 9.0 -> 7.8 -Continue to Monitor for S/Sx of Infection -Repeat CBC in AM   Erythrocytosis, slightly improved -Likely in the setting of Tobacco Abuse -Improving because of IVF Hydration and dilutional drop. Hb/Hct now 15.8/47.0 -Continue to Monitor  Constipation -Increased Senna Docusate 1 tab po qHS and Miralax 17 grams po Daily to BID Dosing   DVT prophylaxis: Heparin 5,000 units sq q8h Code Status: FULL CODE Family Communication: No family at Bedside Disposition Plan: D/C Home with Outpatient PT/OT with Levan Hurst with 5" Wheels  Consultants:   Neurology Stroke Team  Neurosurgery    Procedures: ECHOCARDIOGRAM ------------------------------------------------------------------- Study Conclusions  - Left ventricle: The cavity size was normal. Systolic function was   moderately reduced. The estimated ejection fraction was in the   range of 35% to 40%. Images were inadequate for LV wall motion   assessment and cannot visualize the apex adequately. Features are   consistent with a pseudonormal left ventricular filling pattern,   with concomitant abnormal relaxation and increased filling   pressure (grade 2 diastolic dysfunction).  Doppler parameters are   consistent with high ventricular filling pressure. - Aortic valve: Trileaflet; normal thickness, mildly calcified   leaflets. - Mitral valve: There was trivial regurgitation. - Left atrium: The atrium was mildly dilated. - Pulmonary arteries: PA peak pressure: 34 mm Hg (S). - Pericardium, extracardiac: A trivial pericardial effusion was   identified posterior to the heart. - Recommendations: Limited study with definity contrast to assess   wall motion and rule out apical thrombus.  Recommendations:  Limited study with definity contrast to assess wall motion and rule out apical thrombus.  LIMITED ECHOCARDIOGRAM 02/22/17 ------------------------------------------------------------------- Study Conclusions  - Technical notes: Definity to assess LV. - Left ventricle: Systolic function was mildly reduced. The   estimated ejection fraction was in the range of 45% to 50%.  Impressions:  - LVEF 45-50% - no mural thrombus.   Antimicrobials:  Anti-infectives (From admission, onward)   None     Subjective: Patient was seen and examined and had no complaints today but wanting to leave soon. No CP or SOB. No headaches.   Objective: Vitals:   02/25/17 1400 02/25/17 1500 02/25/17 1530 02/25/17 1600  BP: (!) 147/84 (!) 161/85  (!) 159/103  Pulse: 73 77 77 73  Resp: 20 16 (!) 26 19  Temp:      TempSrc:      SpO2: 99% 97% 100% 97%  Weight:      Height:        Intake/Output Summary (Last 24 hours) at 02/25/2017 1655 Last data filed at  02/25/2017 1600 Gross per 24 hour  Intake 1803.75 ml  Output 2575 ml  Net -771.25 ml   Filed Weights   02/20/17 0501  Weight: 76.4 kg (168 lb 6.9 oz)   Examination: Physical Exam:  Constitutional: WN/WD Caucasian male laying in bed wanting to rest in NAD Eyes: Sclerae anicteric; Lids normal ENMT: External Ears and nose appear normal Neck: Supple with no JVD Respiratory: CTAB; No appreciable  wheezing/rales/rhonchi Cardiovascular: RRR; No LE edma Abdomen: Soft, NT, ND, Bowel sounds GU: Deferred Musculoskeletal: No contractures; No cyanosis. Left IJ CVC in place Skin: Warm and Dry; No rashes or lesions on a limited skin eval Neurologic: CN 2-12 grossly intact. No appreciable focal deficits Psychiatric: Intact judgement and insight. Awake and alert  Data Reviewed: I have personally reviewed following labs and imaging studies  CBC: Recent Labs  Lab 02/21/17 0918 02/22/17 0420 02/23/17 0520 02/24/17 0531 02/25/17 0411  WBC 10.1 11.9* 9.0 7.8 7.8  NEUTROABS 6.9 9.2* 6.9 5.2 4.6  HGB 15.8 17.5* 16.9 16.3 15.8  HCT 46.9 49.8 49.7 48.5 47.0  MCV 89.0 85.9 87.3 88.2 88.2  PLT 223 236 223 240 237   Basic Metabolic Panel: Recent Labs  Lab 02/19/17 1118  02/21/17 0340  02/22/17 0420  02/23/17 0520  02/24/17 0531 02/24/17 1128 02/24/17 1714 02/24/17 2315 02/25/17 0411  NA 134*   < > 139   < > 139   < > 139   < > 141 141 140 141 140  K 3.7  --  3.8  --  3.3*  --  3.8  --  3.8  --   --   --  4.1  CL 102  --  107  --  102  --  104  --  108  --   --   --  108  CO2 22  --  23  --  25  --  26  --  26  --   --   --  25  GLUCOSE 199*  --  145*  --  134*  --  191*  --  181*  --   --   --  160*  BUN 17  --  10  --  9  --  10  --  8  --   --   --  8  CREATININE 1.06   < > 0.93  --  0.84  --  0.80  --  0.85  --   --   --  0.86  CALCIUM 9.1  --  8.5*  --  9.1  --  8.9  --  8.9  --   --   --  8.8*  MG 1.9  --   --   --  2.0  --  1.9  --  1.9  --   --   --  1.8  PHOS  --   --   --   --  3.2  --  2.9  --  3.2  --   --   --  3.6   < > = values in this interval not displayed.   GFR: Estimated Creatinine Clearance: 103.7 mL/min (by C-G formula based on SCr of 0.86 mg/dL). Liver Function Tests: Recent Labs  Lab 02/19/17 1118 02/22/17 0420 02/23/17 0520 02/24/17 0531 02/25/17 0411  AST 21 19 16 17 19   ALT 12* 12* 13* 14* 15*  ALKPHOS 80 81 73 72 69  BILITOT 0.7 1.1 1.0 1.0  0.9  PROT  7.5 6.9 6.6 6.2* 5.9*  ALBUMIN 4.1 3.8 3.6 3.2* 3.2*   Recent Labs  Lab 02/19/17 1118  LIPASE 50   No results for input(s): AMMONIA in the last 168 hours. Coagulation Profile: No results for input(s): INR, PROTIME in the last 168 hours. Cardiac Enzymes: Recent Labs  Lab 02/19/17 1118 02/19/17 1957  TROPONINI 0.04* 0.05*   BNP (last 3 results) No results for input(s): PROBNP in the last 8760 hours. HbA1C: No results for input(s): HGBA1C in the last 72 hours. CBG: Recent Labs  Lab 02/24/17 1136 02/24/17 1709 02/24/17 2112 02/25/17 0857 02/25/17 1208  GLUCAP 163* 197* 173* 237* 180*   Lipid Profile: Recent Labs    02/23/17 1115  TRIG 112   Thyroid Function Tests: No results for input(s): TSH, T4TOTAL, FREET4, T3FREE, THYROIDAB in the last 72 hours. Anemia Panel: No results for input(s): VITAMINB12, FOLATE, FERRITIN, TIBC, IRON, RETICCTPCT in the last 72 hours. Sepsis Labs: Recent Labs  Lab 02/19/17 0059 02/19/17 1118 02/19/17 1957  LATICACIDVEN 1.7 2.7* 0.9    Recent Results (from the past 240 hour(s))  MRSA PCR Screening     Status: None   Collection Time: 02/20/17  5:30 AM  Result Value Ref Range Status   MRSA by PCR NEGATIVE NEGATIVE Final    Comment:        The GeneXpert MRSA Assay (FDA approved for NASAL specimens only), is one component of a comprehensive MRSA colonization surveillance program. It is not intended to diagnose MRSA infection nor to guide or monitor treatment for MRSA infections.     Radiology Studies: Ct Head Wo Contrast  Result Date: 02/25/2017 CLINICAL DATA:  Follow-up posterior-inferior cerebellar artery territory infarct, history of vertebral artery dissection. EXAM: CT HEAD WITHOUT CONTRAST TECHNIQUE: Contiguous axial images were obtained from the base of the skull through the vertex without intravenous contrast. COMPARISON:  CT HEAD February 23, 2017 FINDINGS: BRAIN: RIGHT inferior cerebellar cytotoxic edema with  slightly decreased mass effect. Mildly effaces suprasellar cistern. Slightly dilated third ventricles and lateral ventricle. Old RIGHT corona radiata to internal capsule lacunar infarct. No intraparenchymal hemorrhage or acute large vascular territory infarcts. No abnormal extra-axial fluid collections. VASCULAR: Unremarkable. SKULL/SOFT TISSUES: No skull fracture. Old nondisplaced RIGHT nasal bone fracture. No significant soft tissue swelling. ORBITS/SINUSES: The included ocular globes and orbital contents are normal.The mastoid aircells and included paranasal sinuses are well-aerated. OTHER: None. IMPRESSION: 1. Evolving RIGHT PICA infarct without hemorrhagic conversion. 2. Decreasing, minimal residual hydrocephalus. Electronically Signed   By: Awilda Metroourtnay  Bloomer M.D.   On: 02/25/2017 05:38   Scheduled Meds: .  stroke: mapping our early stages of recovery book   Does not apply Once  . aspirin EC  325 mg Oral Daily  . atorvastatin  80 mg Oral q1800  . Chlorhexidine Gluconate Cloth  6 each Topical Q0600  . clopidogrel  75 mg Oral Daily  . heparin  5,000 Units Subcutaneous Q8H  . insulin aspart  0-9 Units Subcutaneous TID WC  . lisinopril  20 mg Oral BID  . pantoprazole  40 mg Oral BID  . polyethylene glycol  17 g Oral BID  . potassium chloride  40 mEq Oral BID  . senna-docusate  1 tablet Oral BID  . sodium chloride flush  10-40 mL Intracatheter Q12H  . sodium chloride  2 g Oral BID WC   Continuous Infusions: . clevidipine Stopped (02/23/17 0900)  . sodium chloride (hypertonic) 37.5 mL/hr at 02/25/17 1600    LOS: 5 days  Merlene Laughter, DO [facility] Pager [phone]  If 7PM-7AM, please contact night-coverage [email or web address] Password [email or web address] 02/25/2017, 4:55 PM

## 2017-02-26 LAB — CBC WITH DIFFERENTIAL/PLATELET
Basophils Absolute: 0.1 10*3/uL (ref 0.0–0.1)
Basophils Relative: 1 %
EOS ABS: 0.2 10*3/uL (ref 0.0–0.7)
Eosinophils Relative: 2 %
HCT: 48.2 % (ref 39.0–52.0)
HEMOGLOBIN: 16.4 g/dL (ref 13.0–17.0)
LYMPHS ABS: 2.2 10*3/uL (ref 0.7–4.0)
Lymphocytes Relative: 29 %
MCH: 29.9 pg (ref 26.0–34.0)
MCHC: 34 g/dL (ref 30.0–36.0)
MCV: 87.8 fL (ref 78.0–100.0)
MONOS PCT: 7 %
Monocytes Absolute: 0.5 10*3/uL (ref 0.1–1.0)
NEUTROS ABS: 4.6 10*3/uL (ref 1.7–7.7)
NEUTROS PCT: 61 %
Platelets: 235 10*3/uL (ref 150–400)
RBC: 5.49 MIL/uL (ref 4.22–5.81)
RDW: 13.3 % (ref 11.5–15.5)
WBC: 7.5 10*3/uL (ref 4.0–10.5)

## 2017-02-26 LAB — GLUCOSE, CAPILLARY
GLUCOSE-CAPILLARY: 141 mg/dL — AB (ref 65–99)
GLUCOSE-CAPILLARY: 196 mg/dL — AB (ref 65–99)
Glucose-Capillary: 185 mg/dL — ABNORMAL HIGH (ref 65–99)
Glucose-Capillary: 180 mg/dL — ABNORMAL HIGH (ref 65–99)

## 2017-02-26 LAB — COMPREHENSIVE METABOLIC PANEL
ALK PHOS: 71 U/L (ref 38–126)
ALT: 23 U/L (ref 17–63)
ANION GAP: 11 (ref 5–15)
AST: 24 U/L (ref 15–41)
Albumin: 3.4 g/dL — ABNORMAL LOW (ref 3.5–5.0)
BILIRUBIN TOTAL: 0.7 mg/dL (ref 0.3–1.2)
BUN: 11 mg/dL (ref 6–20)
CALCIUM: 9.1 mg/dL (ref 8.9–10.3)
CO2: 25 mmol/L (ref 22–32)
Chloride: 101 mmol/L (ref 101–111)
Creatinine, Ser: 0.82 mg/dL (ref 0.61–1.24)
GFR calc non Af Amer: >60 mL/min (ref >60)
Glucose, Bld: 112 mg/dL — ABNORMAL HIGH (ref 65–99)
Potassium: 4 mmol/L (ref 3.5–5.1)
Sodium: 137 mmol/L (ref 135–145)
TOTAL PROTEIN: 6.1 g/dL — AB (ref 6.5–8.1)

## 2017-02-26 LAB — MAGNESIUM: Magnesium: 2.1 mg/dL (ref 1.7–2.4)

## 2017-02-26 LAB — PHOSPHORUS: PHOSPHORUS: 4.4 mg/dL (ref 2.5–4.6)

## 2017-02-26 LAB — SODIUM: Sodium: 137 mmol/L (ref 135–145)

## 2017-02-26 NOTE — Progress Notes (Signed)
NEUROHOSPITALISTS STROKE TEAM - DAILY PROGRESS NOTE   SUBJECTIVE (INTERVAL HISTORY) RN is at the bedside. Pt Na 137 this am. And hypertonic saline drip was just opened up this morning Denies HA, N/V. Asking for shower and wash hair . No new complaints Blood pressure is adequately controlled    OBJECTIVE Lab Results: CBC:  Recent Labs  Lab 02/24/17 0531 02/25/17 0411 02/26/17 0515  WBC 7.8 7.8 7.5  HGB 16.3 15.8 16.4  HCT 48.5 47.0 48.2  MCV 88.2 88.2 87.8  PLT 240 237 235   BMP: Recent Labs  Lab 02/22/17 0420  02/23/17 0520  02/24/17 0531  02/24/17 2315 02/25/17 0411 02/25/17 1711 02/26/17 0000 02/26/17 0515  NA 139   < > 139   < > 141   < > 141 140 140 137 137  K 3.3*  --  3.8  --  3.8  --   --  4.1  --   --  4.0  CL 102  --  104  --  108  --   --  108  --   --  101  CO2 25  --  26  --  26  --   --  25  --   --  25  GLUCOSE 134*  --  191*  --  181*  --   --  160*  --   --  112*  BUN 9  --  10  --  8  --   --  8  --   --  11  CREATININE 0.84  --  0.80  --  0.85  --   --  0.86  --   --  0.82  CALCIUM 9.1  --  8.9  --  8.9  --   --  8.8*  --   --  9.1  MG 2.0  --  1.9  --  1.9  --   --  1.8  --   --  2.1  PHOS 3.2  --  2.9  --  3.2  --   --  3.6  --   --  4.4   < > = values in this interval not displayed.   Liver Function Tests:  Recent Labs  Lab 02/22/17 0420 02/23/17 0520 02/24/17 0531 02/25/17 0411 02/26/17 0515  AST 19 16 17 19 24   ALT 12* 13* 14* 15* 23  ALKPHOS 81 73 72 69 71  BILITOT 1.1 1.0 1.0 0.9 0.7  PROT 6.9 6.6 6.2* 5.9* 6.1*  ALBUMIN 3.8 3.6 3.2* 3.2* 3.4*   No results for input(s): LIPASE, AMYLASE in the last 168 hours. Thyroid Function Studies:  No results for input(s): TSH, T4TOTAL, T3FREE, THYROIDAB in the last 72 hours. Cardiac Enzymes:  Recent Labs  Lab 02/19/17 1957  TROPONINI 0.05*   Urine Drug Screen:     Component Value Date/Time   LABOPIA POSITIVE (A) 02/20/2017 1600   COCAINSCRNUR NONE DETECTED 02/20/2017 1600   COCAINSCRNUR NONE DETECTED 12/29/2015 0823   LABBENZ POSITIVE (A) 02/20/2017 1600   AMPHETMU NONE DETECTED 02/20/2017 1600   THCU NONE DETECTED 02/20/2017 1600   LABBARB POSITIVE (A) 02/20/2017 1600    PHYSICAL EXAM Temp:  [97.8 F (36.6 C)-98.6 F (37 C)] 98.6 F (37 C) (02/05 1200) Pulse Rate:  [65-85] 70 (02/05 1200) Resp:  [14-26] 21 (02/05 1200) BP: (128-187)/(68-111) 147/84 (02/05 1200) SpO2:  [95 %-100 %] 98 % (02/05 1200) General - Well nourished, well developed, in no apparent distress  HEENT-  Normocephalic, Cardiovascular - Regular rate and rhythm  Respiratory - Lungs clear bilaterally. No wheezing. Abdomen - soft and non-tender, BS normal Extremities- no edema or cyanosis Neurological Examination Mental Status: Alert, oriented, thought content appropriate.  Speech fluent without evidence of aphasia. Able to follow 3 step commands without difficulty. Cranial Nerves: II: Visual fields grossly normal,  III,IV, VI: ptosis not present, extra-ocular motions intact bilaterally, pupils equal, round, reactive to light and accommodation V,VII: smile symmetric, facial light touch sensation normal bilaterally VIII: hearing normal bilaterally IX,X: uvula rises symmetrically XI: bilateral shoulder shrug XII: midline tongue extension Motor: Right : Upper extremity   5/5    Left:     Upper extremity   5/5  Lower extremity   5/5     Lower extremity   5/5 Tone and bulk:normal tone throughout; no atrophy noted Sensory: Pinprick and light touch intact throughout, bilaterally Deep Tendon Reflexes: 2+ and symmetric throughout Plantars: Right: downgoing   Left: downgoing Cerebellar: normal finger-to-nose, normal rapid alternating movements and normal heel-to-shin test Gait: not tested  IMAGING: I have personally reviewed the radiological images below and agree with the radiology interpretations.  Mr Kenneth [name] Neck W [name] Contrast Result Date:  02/20/2017 IMPRESSION: 1. 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 The vessel appears to be reconstituted in the distal V2 segment. 2. Negative MRI appearance of the left vertebral artery and bilateral cervical carotid arteries. Electronically Signed   By: Odessa Fleming M.D.   On: 02/20/2017 12:54   Mr Kenneth [name] Contrast  Result Date: 02/19/2017 IMPRESSION: 1. 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 2. No hydrocephalus. 3. Small remote right corona radiata infarct. Electronically Signed   By: Marnee Spring M.D.   On: 02/19/2017 15:51   Ct Angio Head/ Neck W Or [name] Contrast Result Date: 02/19/2017  IMPRESSION: 1. The right vertebral artery is abnormal throughout its course. 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 2. Unfortunately the distal right vertebral artery is dominant. The left vertebral is normal but diminutive beyond the left PICA. Still, the vertebrobasilar junction, basilar artery, and other posterior circulation remain patent. 3. Stable CT appearance of the Kenneth since 1156 hours today with no acute infarct evident. 4. Minimal carotid atherosclerosis, no carotid stenosis, and otherwise negative anterior circulation. Electronically Signed   By: Odessa Fleming M.D.   On: 02/19/2017 13:51   Ct Head [name] Contrast Result Date: 02/19/2017 IMPRESSION: 1. No evidence of acute intracranial abnormality. 2. 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 Electronically Signed   By: Sebastian Ache M.D.   On: 02/19/2017 12:10   Echocardiogram:                                               Study Conclusions  - Left ventricle: The cavity size was normal. Systolic function was   moderately reduced. The estimated ejection fraction was in the   range of 35% to 40%. Images were inadequate  for LV wall motion   assessment and cannot visualize the apex adequately. Features are   consistent with a pseudonormal left ventricular filling pattern,   with concomitant abnormal relaxation and increased filling   pressure (grade 2 diastolic dysfunction). Doppler parameters are  consistent with high ventricular filling pressure. - Aortic valve: Trileaflet; normal thickness, mildly calcified   leaflets. - Mitral valve: There was trivial regurgitation. - Left atrium: The atrium was mildly dilated. - Pulmonary arteries: PA peak pressure: 34 mm Hg (S). - Pericardium, extracardiac: A trivial pericardial effusion was   identified posterior to the heart. - Recommendations: Limited study with definity contrast to assess   wall motion and rule out apical thrombus.  Echocardiogram Limited:       02-22-2017, 12:29:44            Study Conclusions - Technical notes: Definity to assess LV. - Left ventricle: Systolic function was mildly reduced. The   estimated ejection fraction was in the range of 45% to 50%. Impressions:- LVEF 45-50% - no mural thrombus.  CT HEAD WITHOUT CONTRAST 02/21/2017 22:45 IMPRESSION: 1. Evolving RIGHT posterior-inferior cerebellar artery territory infarct with increasing edema and mass effect, no hemorrhagic conversion. 2. New mild obstructive hydrocephalus.  CT HEAD WITHOUT CONTRAST  02/22/2017 03:01 IMPRESSION: 1. Stable distribution of right PICA infarction. Stable associated mass effect with partial effacement of fourth ventricle and quadrigeminal plate cistern. Stable ventricle size with mild hydrocephalus. 2. No new acute intracranial abnormality identified.  Ct Head [name] Contrast 02/23/2017 IMPRESSION: 1. Unchanged right PICA infarct and associated posterior fossa mass effect. Unchanged mild hydrocephalus. 2. No new intracranial abnormality.      IMPRESSION:  52 year old male with history of diabetes, hypertension, smoker admitted for dizziness,  nausea and vomiting.  He admitted on and off neck pain for the last 1 week.  CT head no hemorrhage.  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  MRI showed right cerebellum infarct and remote right CR infarct.  EF 35-40%, not able to rule out LV thrombosis.  TTE with contrast showed EF 45% and no mural thrombus.  LDL 63, TG 253 and A1c 7.7.  His TG much improved from 1 year ago.  Currently on aspirin, Plavix and Lipitor.  Patient had a stroke 12/2015 with left upper extremity numbness, gait imbalance and left-sided coordination difficulty.  MRI showed right BG/CR infarct.  Carotid Doppler negative.  EF 30-35%.  [facility]  TG 2631, A1c 9.5.  He is not compliant with medication at home.  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  Suspected Etiology: dissection vs athero vs cardioembolic Resultant Symptoms: Ataxia, Neck Pain, H/A, Nausea Stroke Risk Factors: diabetes mellitus, hyperlipidemia, hypertension and smoking Other Stroke Risk Factors: Advanced age, Hx stroke, CAD  PLAN  02/26/2017: Continue Aspirin/ Plavix/Statin Repeat Head CT STAT if Neuro exam acutely worsens Frequent neuro checks Telemetry monitoring PT/OT/SLP Consult PM & Rehab Consult Case Management /MSW Ongoing aggressive stroke risk factor management Patient counseled to be compliant with his antithrombotic medications Patient counseled on Lifestyle modifications including, Diet, Exercise, and Stress Follow up with [facility] in 6 weeks  HX OF STROKES: Small remote right corona radiata infarct  Headache Continue Fioricet PRN  Cerebral Edema  Moderate size cerebellar stroke, Repeat CT shows Mild hydrocephalus but stable,  NA 140 today, Levels every 6 hrs, continue 3% saline.  NS consulted and no need for surgery at this  time, Appreciate assistance Central line placement by CCM - Appreciate assistanace Obtain STAT if Neuro exam acutely worsens  Leukocytosis  - resolved Remains afebrile, CXR 1/30 Negative Repeat labs in  AM  Hypokalemia  Replacement in progress Repeat labs in AM  HYPERTENSION: Unstable, some elevated B/P's noted overnight Permissive hypertension - SBP Goal now less than 180 Labetalol PRN Long term BP goal normotensive. Home Meds: Lisinopril restarted 02/22/2017  HYPERLIPIDEMIA:    Component Value Date/Time   CHOL 141 02/21/2017 0340   CHOL 174 07/30/2014 0902   TRIG 112 02/23/2017 1115   HDL 27 (L) 02/21/2017 0340   HDL 31 (L) 07/30/2014 0902   CHOLHDL 5.2 02/21/2017 0340   VLDL 51 (H) 02/21/2017 0340   LDLCALC 63 02/21/2017 0340   LDLCALC 111 (H) 07/30/2014 0902  Home Meds:  Lipitor 80 mg LDL  goal < 70 Continued on Lipitor to 80 mg daily Continue statin at discharge  DIABETES: Lab Results  Component Value Date   HGBA1C 7.7 (H) 02/19/2017  HgbA1c goal < 7.0 Currently on: Novolog Continue CBG monitoring and SSI to maintain glucose 140-180 mg/dl DM education   TOBACCO ABUSE Current smoker Smoking cessation counseling provided Nicotine patch provided  Other Active Problems: Active Problems:   Stroke (cerebrum) (HCC)   Ischemic stroke (HCC)   Type 2 diabetes mellitus with hyperlipidemia (HCC)   Uncontrolled type 2 diabetes mellitus with hyperglycemia (HCC)   Tobacco dependence   Vertebral artery dissection (HCC)   Obstructive hydrocephalus   Encounter for central line placement    Hospital day # 6 VTE prophylaxis:  Heparin Diet : Fall precautions Diet Heart Room service appropriate? Yes; Fluid consistency: Thin   FAMILY UPDATES:  family at bedside  TEAM UPDATES: Merlene Laughter, DO   Prior Home Stroke Medications:  aspirin 81 mg daily  Discharge Stroke Meds:  Please discharge patient on aspirin 325 mg daily   Disposition: 66-Critical Access  Hospital Therapy Recs:               PENDING Follow Up:  Follow-up Information    [facility] Follow up on 03/04/2017.   Why:  9 am for hospital follow up Contact information: [address] [id number] [place] [address] [phone]       Nilda Riggs, NP. Schedule an appointment as soon as possible for a visit in 6 week(s).   Specialty:  Family Medicine Contact information: [address] [place] [address] [phone]          Anola Gurney, [place] -PCP Follow up in 1-2 weeks      Assessment & plan discussed with with attending physician and they are in agreement.       ATTENDING NOTE: I reviewed above note and agree with the assessment and plan. I have made any additions or clarifications directly to the above note. Pt was seen and examined.   52 year old male with history of diabetes, hypertension, smoker admitted for dizziness, nausea and vomiting.  He admitted on and off neck pain for the last 1 week.  CT head no hemorrhage.  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  MRI showed right cerebellum infarct and remote right CR infarct.  EF 35-40%, not able to rule out LV thrombosis. Had TTE with contrast showed EF 45-50% and no mural thrombus.  LDL 63, TG 253 and A1c 7.7.  His TG much improved from 1 year ago.  Currently on aspirin, Plavix and Lipitor.  Patient had a stroke 12/2015 with left upper extremity numbness, gait imbalance and left-sided coordination  difficulty.  MRI showed right BG/CR infarct.  Carotid Doppler negative.  EF 30-35%.  He has been following with cardiology in [place] [facility].  TG 2631, A1c 9.5.  He is not compliant with medication at home.  Patient 02/21/17 complained of severe headache with nausea vomiting.  Repeat CT showed mild hydrocephalus. Put on 3% saline and transfer to ICU. NSG Dr.  Lovell Sheehan contacted and no EVD at this time will continue monitoring. Pt HA improved and no more N/V. Repeat CT today stable mild hydrocephalus. Pt doing well. Na 139->143. Urine Na high at 230, consistent with salt wasting syndrome. Pt neurologically stable and symptoms free, will not chasing numbers.  Tapered hypertonic saline and discontinued this morning.   Long discussion with patient and Dr. Debby Bud. Mobilize out of bed. Therapy consults This patient is critically ill and at significant risk of neurological worsening, death and care requires constant monitoring of vital signs, hemodynamics,respiratory and cardiac monitoring, extensive review of multiple databases, frequent neurological assessment, discussion with family, other specialists and medical decision making of high complexity.I have made any additions or clarifications directly to the above note.This critical care time does not reflect procedure time, or teaching time or supervisory time of PA/NP/Med Resident etc but could involve care discussion time.  I spent 30 minutes of neurocritical care time  in the care of  this patient. Consider possible participation in the stroke atrial fibrillation trial if interested. He'll be given information to review and decide    Delia Heady, MD Stroke Neurology 02/26/2017 1:06 PM       To contact Stroke Continuity provider, please refer to [email or web address]. After hours, contact General Neurology

## 2017-02-26 NOTE — Progress Notes (Signed)
Occupational Therapy Treatment Patient Details Name: Kenneth [name] MRN: [id number] DOB: 06/22/1965 Today's Date: 02/26/2017    History of present illness Pt is a 52 y.o. male presenting with intractable nausea and vomiting and gait imbalance with neck pain x 1 week. 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  PMH includes HTN, DM, CAD.    OT comments  Pt progressing towards established OT goals. However, continues to demonstrate decreased balance and poor awareness, problem solving, and high impulsivity. Pt performing grooming at sink with Min Guard. Pt performing dressing and bathing with Min A for safety in standing. Providing education for safe tub transfer; pt and wife verbalize understanding. Will continue to follow acutely and continue to recommend follow up at Neuro OP OT.    Follow Up Recommendations  Supervision/Assistance - 24 hour;Outpatient OT(Neuro OP OT)    Equipment Recommendations  None recommended by OT    Recommendations for Other Services      Precautions / Restrictions Precautions Precautions: Fall Restrictions Weight Bearing Restrictions: No       Mobility Bed Mobility Overal bed mobility: Independent                Transfers Overall transfer level: Needs assistance   Transfers: Sit to/from Stand Sit to Stand: Supervision         General transfer comment: supervision for safety    Balance Overall balance assessment: Needs assistance Sitting-balance support: No upper extremity supported;Feet supported Sitting balance-Leahy Scale: Good     Standing balance support: During functional activity;No upper extremity supported Standing balance-Leahy Scale: Fair Standing balance comment: performing grooming at sink                           ADL either performed or assessed with clinical judgement   ADL Overall ADL's : Needs assistance/impaired     Grooming: Min guard;Standing(Shaving) Grooming Details (indicate cue type  and reason): Min Guard for safety standing at sink to perform shaving. Pt leaning with his hips against sink throughout task to maintain balance Upper Body Bathing: Set up;Supervision/ safety;Sitting   Lower Body Bathing: Minimal assistance;Sit to/from stand Lower Body Bathing Details (indicate cue type and reason): Min A for standing balance due to poor safety awarness and decreased balance Upper Body Dressing : Min guard;Sitting Upper Body Dressing Details (indicate cue type and reason): donned new gown Lower Body Dressing: Minimal assistance;Sit to/from stand Lower Body Dressing Details (indicate cue type and reason): Min A for standing balance while donning underwear. Pt able to don socks by bringing ankles to knees.    Toilet Transfer Details (indicate cue type and reason): Min A for use of urinal       Tub/Shower Transfer Details (indicate cue type and reason): Educated pt and wife on safe tub transfer techniques. Pt and wife verbalize understanding Functional mobility during ADLs: Minimal assistance(Poor balance and impulsivity) General ADL Comments: Pt shaving at sink and perform shower (with RN approval). Pt demosntrating poor balance and awareness. Pt with single LOB during LB dressing due to impulsive standing.      Vision       Perception     Praxis      Cognition Arousal/Alertness: Awake/alert Behavior During Therapy: Impulsive Overall Cognitive Status: Impaired/Different from baseline Area of Impairment: Problem solving;Safety/judgement;Following commands;Awareness                       Following Commands: Follows one step  commands consistently;Follows multi-step commands with increased time Safety/Judgement: Decreased awareness of safety;Decreased awareness of deficits Awareness: Emergent Problem Solving: Slow processing;Requires verbal cues General Comments: Pt with poor awarness and requiring increased cues for safety. Impulsive throughout session and  with decreased understanding of deficits with balance.        Exercises     Shoulder Instructions       General Comments Wife present throughout session    Pertinent Vitals/ Pain       Pain Assessment: No/denies pain  Home Living                                          Prior Functioning/Environment              Frequency  Min 3X/week        Progress Toward Goals  OT Goals(current goals can now be found in the care plan section)  Progress towards OT goals: Progressing toward goals  Acute Rehab OT Goals Patient Stated Goal: to go home OT Goal Formulation: With patient Time For Goal Achievement: 03/10/17 Potential to Achieve Goals: Good ADL Goals Pt Will Perform Upper Body Bathing: with modified independence;sitting Pt Will Perform Lower Body Bathing: with modified independence;sit to/from stand Pt Will Perform Upper Body Dressing: with modified independence;sitting Pt Will Perform Lower Body Dressing: with modified independence;sit to/from stand Pt Will Transfer to Toilet: with modified independence;ambulating  Plan Discharge plan remains appropriate    Co-evaluation                 AM-PAC PT "6 Clicks" Daily Activity     Outcome Measure   Help from another person eating meals?: None Help from another person taking care of personal grooming?: A Little Help from another person toileting, which includes using toliet, bedpan, or urinal?: A Little Help from another person bathing (including washing, rinsing, drying)?: A Little Help from another person to put on and taking off regular upper body clothing?: A Little Help from another person to put on and taking off regular lower body clothing?: A Little 6 Click Score: 19    End of Session    OT Visit Diagnosis: Unsteadiness on feet (R26.81);Muscle weakness (generalized) (M62.81);Pain;Other symptoms and signs involving cognitive function Pain - part of body: (head)   Activity  Tolerance Patient tolerated treatment well   Patient Left with call bell/phone within reach;in chair;with nursing/sitter in room;with family/visitor present   Nurse Communication Mobility status        Time: [id number] OT Time Calculation (min): 38 min  Charges: OT General Charges $OT Visit: 1 Visit OT Treatments $Self Care/Home Management : 38-52 mins  Adelei Scobey MSOT, [name] Acute Rehab Pager: [phone] Office: [phone]   Theodoro GristCharis M Liliani Bobo 02/26/2017, 5:05 PM

## 2017-02-26 NOTE — Progress Notes (Signed)
Patient ID: Kenneth [name], male   DOB: 10/08/1965, 52 y.o.   MRN: [id number] Subjective: The patient is alert and pleasant.  He denies headache.  He looks well.  Objective: Vital signs in last 24 hours: Temp:  [97.7 F (36.5 C)-98.5 F (36.9 C)] 98.5 F (36.9 C) (02/05 0344) Pulse Rate:  [65-85] 65 (02/05 0740) Resp:  [15-26] 15 (02/05 0740) BP: (147-187)/(74-111) 158/105 (02/05 0700) SpO2:  [95 %-100 %] 98 % (02/05 0740) Estimated body mass index is 24.17 kg/m as calculated from the following:   Height as of this encounter: 5\' 10"  (1.778 m).   Weight as of this encounter: 76.4 kg (168 lb 6.9 oz).   Intake/Output from previous day: 02/04 0701 - 02/05 0700 In: 1037.1 [P.O.:240; I.V.:797.1] Out: 1675 [Urine:1675] Intake/Output this shift: No intake/output data recorded.  Physical exam patient is alert and oriented.  He is moving all 4 extremities well.  Lab Results: Recent Labs    02/25/17 0411 02/26/17 0515  WBC 7.8 7.5  HGB 15.8 16.4  HCT 47.0 48.2  PLT 237 235   BMET Recent Labs    02/25/17 0411  02/26/17 0000 02/26/17 0515  NA 140   < > 137 137  K 4.1  --   --  4.0  CL 108  --   --  101  CO2 25  --   --  25  GLUCOSE 160*  --   --  112*  BUN 8  --   --  11  CREATININE 0.86  --   --  0.82  CALCIUM 8.8*  --   --  9.1   < > = values in this interval not displayed.    Studies/Results: Ct Head Wo Contrast  Result Date: 02/25/2017 CLINICAL DATA:  Follow-up posterior-inferior cerebellar artery territory infarct, history of vertebral artery dissection. EXAM: CT HEAD WITHOUT CONTRAST TECHNIQUE: Contiguous axial images were obtained from the base of the skull through the vertex without intravenous contrast. COMPARISON:  CT HEAD February 23, 2017 FINDINGS: BRAIN: RIGHT inferior cerebellar cytotoxic edema with slightly decreased mass effect. Mildly effaces suprasellar cistern. Slightly dilated third ventricles and lateral ventricle. Old RIGHT corona radiata to internal  capsule lacunar infarct. No intraparenchymal hemorrhage or acute large vascular territory infarcts. No abnormal extra-axial fluid collections. VASCULAR: Unremarkable. SKULL/SOFT TISSUES: No skull fracture. Old nondisplaced RIGHT nasal bone fracture. No significant soft tissue swelling. ORBITS/SINUSES: The included ocular globes and orbital contents are normal.The mastoid aircells and included paranasal sinuses are well-aerated. OTHER: None. IMPRESSION: 1. Evolving RIGHT PICA infarct without hemorrhagic conversion. 2. Decreasing, minimal residual hydrocephalus. Electronically Signed   By: Awilda Metroourtnay  Bloomer M.D.   On: 02/25/2017 05:38    Assessment/Plan: Right acute infarct, minimal hydrocephalus: The patient is doing well.  I will sign off.  Please call if I can be of further assistance.  LOS: 6 days     Cristi LoronJeffrey D Desiraye Rolfson 02/26/2017, 7:41 AM

## 2017-02-26 NOTE — Progress Notes (Signed)
Physical Therapy Treatment Patient Details Name: Kenneth [name] MRN: [id number] DOB: [birth year] Today's Date: 02/26/2017    History of Present Illness Pt is a 52 y.o. male presenting with intractable nausea and vomiting and gait imbalance with neck pain x 1 week. 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  PMH includes HTN, DM, CAD.     PT Comments    Pt continues to joke throughout session and struggle with balance with gait and all standing activities. Pt with improvements with gait distance and ability to maintain balance without LOB although deficits remain apparent. Pt educated for continued progression, need for assist and need to be OOb. Pt reports lights are still bothering him and that he has decreased dizziness supine with eyes closed. Will continue to follow.     Follow Up Recommendations  Outpatient PT;Supervision/Assistance - 24 hour     Equipment Recommendations  Rolling walker with 5" wheels    Recommendations for Other Services       Precautions / Restrictions Precautions Precautions: Fall    Mobility  Bed Mobility Overal bed mobility: Independent                Transfers Overall transfer level: Needs assistance   Transfers: Sit to/from Stand Sit to Stand: Supervision         General transfer comment: supervision for lines and safety  Ambulation/Gait Ambulation/Gait assistance: Min assist Ambulation Distance (Feet): 600 Feet Assistive device: None Gait Pattern/deviations: Step-through pattern;Decreased stride length   Gait velocity interpretation: Below normal speed for age/gender General Gait Details: pt walked entire distance without AD with frequent right LOB with min assist to correct, improved balance and control of gait with decreased speed and focusing on gait. Remains unable to recall room number but can locate when near it   Stairs            Wheelchair Mobility    Modified Rankin (Stroke Patients Only) Modified  Rankin (Stroke Patients Only) Pre-Morbid Rankin Score: No symptoms Modified Rankin: Moderate disability     Balance Overall balance assessment: Needs assistance   Sitting balance-Leahy Scale: Good       Standing balance-Leahy Scale: Fair   Single Leg Stance - Right Leg: 2 Single Leg Stance - Left Leg: 1 Tandem Stance - Right Leg: 10 Tandem Stance - Left Leg: 30     High level balance activites: Backward walking High Level Balance Comments: continues to have LOB with narrowed BOS and SLS, difficulty with dual tasking and change in direction            Cognition Arousal/Alertness: Awake/alert   Overall Cognitive Status: Impaired/Different from baseline Area of Impairment: Problem solving;Safety/judgement;Following commands;Awareness                       Following Commands: Follows one step commands consistently Safety/Judgement: Decreased awareness of safety;Decreased awareness of deficits     General Comments: pt joking throughout the session and trying to ignore or redirect from balance deficits      Exercises      General Comments        Pertinent Vitals/Pain Pain Assessment: No/denies pain    Home Living                      Prior Function            PT Goals (current goals can now be found in the care plan section) Progress towards PT goals:  Progressing toward goals    Frequency           PT Plan Current plan remains appropriate    Co-evaluation              AM-PAC PT "6 Clicks" Daily Activity  Outcome Measure  Difficulty turning over in bed (including adjusting bedclothes, sheets and blankets)?: None Difficulty moving from lying on back to sitting on the side of the bed? : None Difficulty sitting down on and standing up from a chair with arms (e.g., wheelchair, bedside commode, etc,.)?: None Help needed moving to and from a bed to chair (including a wheelchair)?: A Little Help needed walking in hospital room?:  A Little Help needed climbing 3-5 steps with a railing? : A Little 6 Click Score: 21    End of Session Equipment Utilized During Treatment: Gait belt Activity Tolerance: Patient tolerated treatment well Patient left: in chair;with chair alarm set;with call bell/phone within reach Nurse Communication: Mobility status       Time: [id number] PT Time Calculation (min) (ACUTE ONLY): 30 min  Charges:  $Gait Training: 8-22 mins $Neuromuscular Re-education: 8-22 mins                    G Codes:       Delaney MeigsMaija Tabor Dreon Pineda, PT [phone]    Enedina FinnerMaija B Tavis Kring 02/26/2017, 12:17 PM

## 2017-02-26 NOTE — Progress Notes (Signed)
PROGRESS NOTE    ABUNDIO TEUSCHER  ZOX:[id number] DOB: 1965-02-04 DOA: 02/20/2017 PCP: Anola Gurney, PA   Brief Narrative:  MORRELL FLUKE is a 52 y.o. male with a Past Medical History of DM, HTN, CAD, and CVA who presents with acute CVA diagnosed at [facility].  He was transferred from [place] to [facility] overnight on the Neurology service for diagnostic cerebral angiogram. TRH was consulted and assumed care. He was found to have an acute CVA and a Vertebral Artery Dissection. Workup is currently underway and Neurology is following. A Stat Head CT was obtained last night and showed worsening mass effect mild hydrocephalus obstructive and showed evolving Right PICA Territory Infarct. Because of Concern Neurology transferred patient to ICU and started 3% NS and Neurosurgery was consulted for evaluation and have no plans for Ventricular drainage at this time. Plan is to continue Hypertonic Saline weaning. Repeat Head CT yesterday AM showed Evolving RIGHT PICA infarct without hemorrhagic conversion. Decreasing, minimal residual hydrocephalus.  Patient is stable and Hypertonic Saline now stopped. Neurosurgery has signed off the case and will watch in ICU tonight and transfer to the floor in AM.    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 -CT  Angio Head and Neck showed The right vertebral artery is abnormal throughout its course. 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 Unfortunately the distal right vertebral artery is dominant. The Left vertebral is normal but diminutive beyond the left PICA. Still the vertebrobasilar junction, basilar artery, and other posterior circulation remain patent. Stable CT appearance of the brain since 1156 hours today with no acute infarct evident. . Minimal carotid atherosclerosis, no carotid stenosis, and otherwise negative anterior circulation. -MRI Brain showed Acute right pica territory infarct with known underlying right vertebral dissection. No hydrocephalus. Small remote right corona radiata infarct. -MRA Neck showed Abnormal MRA appearance of the right vertebral artery compatible with proximal vertebral artery dissection and thrombosis. The vessel appears to be reconstituted in the distal V2 segment. Negative MRI appearance of the left vertebral artery and bilateral cervical carotid arteries. -ECHOCardiogram showed EF of 35-40% with Grade 2 DD and ? Apical Thrombus -TEE Cancelled after my Discussion with Cardiology Dr. Royann Shivers and he recommended Limited ECHO with Contrast -Repeat Limited ECHO showed LVEF of 45-50% and no Mural Thrombus -Head CT 02/21/17 showed Evolving RIGHT posterior-inferior cerebellar artery territory infarct with increasing edema and mass effect, no hemorrhagic conversion. New mild obstructive hydrocephalus -Head CT 02/22/17 showed Stable distribution of right PICA infarction. Stable associated mass effect with partial effacement of fourth ventricle and quadrigeminal plate cistern. Stable ventricle size with mild hydrocephalus. No new acute intracranial abnormality identified. -CT Head 02/23/17 showed Unchanged right PICA infarct and associated posterior fossa mass effect abd unchanged mild  hydrocephalus. No new intracranial abnormality. -Repeat Head CT 02/25/17 showed  Evolving RIGHT PICA infarct  without hemorrhagic conversion. Decreasing, minimal residual hydrocephalus -Because of Worsening Mass effect with new mild Hydrocephalus patient was at risk for developing Herniation so was moved to ICU overnight 1/31-2/1 and started on Hypertonic NS 3% at 50 mL/hr; IV NS 3% rate was increased to 75 mL/hr and since patient remained stable it was weaned; Neurology recommended tapering by reducing infusion rate 50% q12h x2 and then discontinuing; IV NS 3% Discontinued  -I discussed th case with Dr. Pearlean Brownie of Neurology who recommended a goal Na+ of 150-155 but since patient is improving want to just continue to monitor clinically  -Checked Na+ Levels q6h but will now stop -Neurosurgery recommended noVentriculostomy presently and still holding off Ventricular Drainage; Neurosurgery signed off. 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 -Neurochecks per Protocol  -PT/OT/SLP/Nutrition Consults with continued therapy; Recommending Outpatient PT with Rolling Walker with 5" Wheels  Cerebral Edema/Mild Obstructive Hydrocephalus -As Above;  -Started on 3% NS and Triple Lumen Placed; Rate Increased to 75 mL but now weaned and stopped.  -Neurosurgery consulted and no Ventriculostomy to be placed at this time.  -Per Neuro STAT Head If Neuro Exam Worsens  -Salt Tabs 2 gram po BID with meals added by Neurology and checking Sodium Levels q6h given Salt Wasting syndrome  -Neurology also gave 23.4% Sodium Chloride  x2 -Neurology recommended not chasing Sodium Level as patient is stable and recommending NS 3% at 75 mL to taper off by reducing rate of 50% q12h x 2 and then discontinuing; Now Stopped  -Repeat Head Scan 02/25/17 shows RIGHT inferior cerebellar cytotoxic edema with slightly decreased mass effect. Mildly effaces suprasellar cistern. Slightly dilated third ventricles and lateral ventricle. Old RIGHT corona radiata to internal capsule lacunar infarct. No intraparenchymal hemorrhage or acute large vascular territory infarcts. No abnormal extra-axial fluid collections.  Right Vertebral Artery Dissection -As Above -C/w ASA 325 mg po Daily, Plavix 75 mg po Daily, and Atorvastatin 80 mg po qHS -Neuro repeated Head CT and findings as above -C/w Telemetry Monitoring -Neurology started 3% NS at 50 mL/hr and increased it to 75 mL/hr and now recommending tapering by reducing the rate 50% q12h x 2 and then discontinuing it; Now discontinued.   Acute Headache -Improving -C/w Acetaminophen and Fioricet PRN  HTN -Allow permissive HTN but Goal changed  -BP now 177/106 -Treat BP only if >180/120, and then with goal of 15% reduction  -Lisinopril 20 mg po BID Restarted by Neurology  -Patient has not been taking any medications for BP.   -Lisinopril and Metoprolol are both available on the $4 [facility] list and would be excellent options for affordability at D/C, but there are a multitude of other medications also available on the list at [facility]. -Continue To Monitor closely   HLD -Lipid Panel showed Cholesterol of 141, HDL of 27, LDL of 63, TG of 253, and VLDL 51 -Patient now on Atorvastatin 80 mg po Daily  -Review of Home Medication list shows patient is supposed to be on Gemfibrozil 600 mg po BID   Diabetes Mellitus Type 2  -Patient's A1c is 7.7 indicating at least some compliance with medications and/or dietary strategies (at  least compared to prior).  -He was previously prescribed Glucophage 1000 mg  BID, and this medication is available for $4/month or $10/3 months at [facility]. -Review of Home Medication list also shows patient was supposed to be on Glimepiride 4 mg po daily but not taking -C/w Senistive Novolog SSI AC -CBG's ranging from 112-196  Tobacco Abuse/Dependence -Smoking Cessation Counseling given. This was discussed with the patient and should be reviewed on an ongoing basis.   -Ordered Nicotine 14 mg TD Patch .  GERD  -Ordered Protonix IV BID on admission and changed to po Protonix 40 mg BID  -Omeprazole 20 or 40 mg is available on the [facility] list for $9 for 30 capsules or $24 for 90 capsules.  CAD -Hx of Stenting -ASA 325 mg po Daily, Clopidogrel 75 mg po daily, and C/w Morphine 2 mg q2hprn -No BB or ACE currently to allow for Permissive HTN -ECHOCardiogram done and showed EF of 35-40% and Grade 2 DD with ? Apical Thrombus  -Repeat Limited ECHO showes no Thrombus -Will need Outpatient Cardiology Follow up when stable to D/C  Leukocytosis, improving  -Improving as WBC went from 21.2 -> 10.1 -> 11.9 -> 9.0 -> 7.8 -> 7.5 -Continue to Monitor for S/Sx of Infection -Repeat CBC in AM   Erythrocytosis, slightly improved -Likely in the setting of Tobacco Abuse -Improving because of IVF Hydration and dilutional drop. Hb/Hct now 16.4/48.2 -Continue to Monitor  Constipation -Increased Senna Docusate 1 tab po qHS and Miralax 17 grams po Daily to BID Dosing   DVT prophylaxis: Heparin 5,000 units sq q8h Code Status: FULL CODE Family Communication: No family at Bedside Disposition Plan: D/C Home with Outpatient PT/OT with Rolling Walker with 5" Wheels; Possible Transfer out of ICU today   Consultants:   Neurology Stroke Team  Neurosurgery    Procedures: ECHOCARDIOGRAM ------------------------------------------------------------------- Study Conclusions  - Left ventricle: The cavity size was normal. Systolic function was   moderately reduced. The estimated  ejection fraction was in the   range of 35% to 40%. Images were inadequate for LV wall motion   assessment and cannot visualize the apex adequately. Features are   consistent with a pseudonormal left ventricular filling pattern,   with concomitant abnormal relaxation and increased filling   pressure (grade 2 diastolic dysfunction). Doppler parameters are   consistent with high ventricular filling pressure. - Aortic valve: Trileaflet; normal thickness, mildly calcified   leaflets. - Mitral valve: There was trivial regurgitation. - Left atrium: The atrium was mildly dilated. - Pulmonary arteries: PA peak pressure: 34 mm Hg (S). - Pericardium, extracardiac: A trivial pericardial effusion was   identified posterior to the heart. - Recommendations: Limited study with definity contrast to assess   wall motion and rule out apical thrombus.  Recommendations:  Limited study with definity contrast to assess wall motion and rule out apical thrombus.  LIMITED ECHOCARDIOGRAM 02/22/17 ------------------------------------------------------------------- Study Conclusions  - Technical notes: Definity to assess LV. - Left ventricle: Systolic function was mildly reduced. The   estimated ejection fraction was in the range of 45% to 50%.  Impressions:  - LVEF 45-50% - no mural thrombus.   Antimicrobials:  Anti-infectives (From admission, onward)   None     Subjective: Patient was seen and examined and had no complaints laying in bed resting. Denied any headache, nausea or vomiting. No CP   Objective: Vitals:   02/26/17 1200 02/26/17 1300 02/26/17 1400 02/26/17 1500  BP: (!) 147/84  137/84 (!) 177/106  Pulse: 70  74 78 73  Resp: (!) 21 11 (!) 0 18  Temp: 98.6 F (37 C)     TempSrc: Oral     SpO2: 98% 98% 99% 97%  Weight:      Height:        Intake/Output Summary (Last 24 hours) at 02/26/2017 1547 Last data filed at 02/26/2017 1200 Gross per 24 hour  Intake 813.41 ml  Output 1375 ml    Net -561.59 ml   Filed Weights   02/20/17 0501  Weight: 76.4 kg (168 lb 6.9 oz)   Examination: Physical Exam:  Constitutional: WN/WD Caucasian male in NAD resting  Eyes: Sclerae anicteric. Lids normal ENMT: External Ears and nose appear normal Neck: Supple with no JVD Respiratory: CTAB; No appreciable wheezing/rales/rhonchi Cardiovascular: RRR; No LE edema Abdomen: Soft, NT, ND. Bowel sounds present GU: Deferred Musculoskeletal: No contractures; No cyanosis. Left IJ CVC Skin: Warm and Dry; No appreciable rashes or lesions on a limited skin eval Neurologic: CN 2-12 grossly intact. No appreciable focal deficits Psychiatric: Intact judgement and insight. Awake and alert  Data Reviewed: I have personally reviewed following labs and imaging studies  CBC: Recent Labs  Lab 02/22/17 0420 02/23/17 0520 02/24/17 0531 02/25/17 0411 02/26/17 0515  WBC 11.9* 9.0 7.8 7.8 7.5  NEUTROABS 9.2* 6.9 5.2 4.6 4.6  HGB 17.5* 16.9 16.3 15.8 16.4  HCT 49.8 49.7 48.5 47.0 48.2  MCV 85.9 87.3 88.2 88.2 87.8  PLT 236 223 240 237 235   Basic Metabolic Panel: Recent Labs  Lab 02/22/17 0420  02/23/17 0520  02/24/17 0531  02/24/17 2315 02/25/17 0411 02/25/17 1711 02/26/17 0000 02/26/17 0515  NA 139   < > 139   < > 141   < > 141 140 140 137 137  K 3.3*  --  3.8  --  3.8  --   --  4.1  --   --  4.0  CL 102  --  104  --  108  --   --  108  --   --  101  CO2 25  --  26  --  26  --   --  25  --   --  25  GLUCOSE 134*  --  191*  --  181*  --   --  160*  --   --  112*  BUN 9  --  10  --  8  --   --  8  --   --  11  CREATININE 0.84  --  0.80  --  0.85  --   --  0.86  --   --  0.82  CALCIUM 9.1  --  8.9  --  8.9  --   --  8.8*  --   --  9.1  MG 2.0  --  1.9  --  1.9  --   --  1.8  --   --  2.1  PHOS 3.2  --  2.9  --  3.2  --   --  3.6  --   --  4.4   < > = values in this interval not displayed.   GFR: Estimated Creatinine Clearance: 108.8 mL/min (by C-G formula based on SCr of 0.82  mg/dL). Liver Function Tests: Recent Labs  Lab 02/22/17 0420 02/23/17 0520 02/24/17 0531 02/25/17 0411 02/26/17 0515  AST 19 16 17 19 24   ALT 12* 13* 14* 15* 23  ALKPHOS 81 73 72 69 71  BILITOT 1.1 1.0  1.0 0.9 0.7  PROT 6.9 6.6 6.2* 5.9* 6.1*  ALBUMIN 3.8 3.6 3.2* 3.2* 3.4*   No results for input(s): LIPASE, AMYLASE in the last 168 hours. No results for input(s): AMMONIA in the last 168 hours. Coagulation Profile: No results for input(s): INR, PROTIME in the last 168 hours. Cardiac Enzymes: Recent Labs  Lab 02/19/17 1957  TROPONINI 0.05*   BNP (last 3 results) No results for input(s): PROBNP in the last 8760 hours. HbA1C: No results for input(s): HGBA1C in the last 72 hours. CBG: Recent Labs  Lab 02/25/17 1208 02/25/17 1809 02/25/17 2111 02/26/17 0828 02/26/17 1144  GLUCAP 180* 138* 112* 141* 196*   Lipid Profile: No results for input(s): CHOL, HDL, LDLCALC, TRIG, CHOLHDL, LDLDIRECT in the last 72 hours. Thyroid Function Tests: No results for input(s): TSH, T4TOTAL, FREET4, T3FREE, THYROIDAB in the last 72 hours. Anemia Panel: No results for input(s): VITAMINB12, FOLATE, FERRITIN, TIBC, IRON, RETICCTPCT in the last 72 hours. Sepsis Labs: Recent Labs  Lab 02/19/17 1957  LATICACIDVEN 0.9    Recent Results (from the past 240 hour(s))  MRSA PCR Screening     Status: None   Collection Time: 02/20/17  5:30 AM  Result Value Ref Range Status   MRSA by PCR NEGATIVE NEGATIVE Final    Comment:        The GeneXpert MRSA Assay (FDA approved for NASAL specimens only), is one component of a comprehensive MRSA colonization surveillance program. It is not intended to diagnose MRSA infection nor to guide or monitor treatment for MRSA infections.     Radiology Studies: Ct Head Wo Contrast  Result Date: 02/25/2017 CLINICAL DATA:  Follow-up posterior-inferior cerebellar artery territory infarct, history of vertebral artery dissection. EXAM: CT HEAD WITHOUT CONTRAST  TECHNIQUE: Contiguous axial images were obtained from the base of the skull through the vertex without intravenous contrast. COMPARISON:  CT HEAD February 23, 2017 FINDINGS: BRAIN: RIGHT inferior cerebellar cytotoxic edema with slightly decreased mass effect. Mildly effaces suprasellar cistern. Slightly dilated third ventricles and lateral ventricle. Old RIGHT corona radiata to internal capsule lacunar infarct. No intraparenchymal hemorrhage or acute large vascular territory infarcts. No abnormal extra-axial fluid collections. VASCULAR: Unremarkable. SKULL/SOFT TISSUES: No skull fracture. Old nondisplaced RIGHT nasal bone fracture. No significant soft tissue swelling. ORBITS/SINUSES: The included ocular globes and orbital contents are normal.The mastoid aircells and included paranasal sinuses are well-aerated. OTHER: None. IMPRESSION: 1. Evolving RIGHT PICA infarct without hemorrhagic conversion. 2. Decreasing, minimal residual hydrocephalus. Electronically Signed   By: Awilda Metro M.D.   On: 02/25/2017 05:38   Scheduled Meds: .  stroke: mapping our early stages of recovery book   Does not apply Once  . aspirin EC  325 mg Oral Daily  . atorvastatin  80 mg Oral q1800  . Chlorhexidine Gluconate Cloth  6 each Topical Q0600  . clopidogrel  75 mg Oral Daily  . heparin  5,000 Units Subcutaneous Q8H  . insulin aspart  0-9 Units Subcutaneous TID WC  . lisinopril  20 mg Oral BID  . pantoprazole  40 mg Oral BID  . polyethylene glycol  17 g Oral BID  . potassium chloride  40 mEq Oral BID  . senna-docusate  1 tablet Oral BID  . sodium chloride flush  10-40 mL Intracatheter Q12H  . sodium chloride  2 g Oral BID WC   Continuous Infusions: . clevidipine Stopped (02/23/17 0900)  . sodium chloride (hypertonic) Stopped (02/26/17 0900)    LOS: 6 days   Heloise Beecham  Marland McalpineSheikh, DO [facility] Pager [phone]  If 7PM-7AM, please contact night-coverage [email or web address] Password [email or web address] 02/26/2017, 3:47 PM

## 2017-02-26 NOTE — Progress Notes (Signed)
Pt is irritable, upset that he is still in the hospital.  Wants to leave in the AM, regardless of what doctors or anybody says.  Pt educated on plan of care and the need for close monitoring given his condition.  Pt calm and compliant, but still adamant about leaving tomorrow.  Will continue to monitor.

## 2017-02-26 NOTE — Research (Signed)
STROKE-AF Research study reviewed with patient and his wife. ICF left for his review. Questions encouraged and answered. I will return and follow up to see if he would like to participate. He is not sure at this point.

## 2017-02-27 MED ORDER — ASPIRIN 325 MG PO TBEC
325.0000 mg | DELAYED_RELEASE_TABLET | Freq: Every day | ORAL | 0 refills | Status: DC
Start: 2017-02-28 — End: 2017-05-22

## 2017-02-27 MED ORDER — ATORVASTATIN CALCIUM 80 MG PO TABS: 80.0000 mg | ORAL_TABLET | Freq: Every day | ORAL | 0 refills | Status: DC

## 2017-02-27 MED ORDER — CLOPIDOGREL BISULFATE 75 MG PO TABS: 75.0000 mg | ORAL_TABLET | Freq: Every day | ORAL | 0 refills | Status: DC

## 2017-02-27 MED ORDER — LISINOPRIL 40 MG PO TABS: 40.0000 mg | ORAL_TABLET | Freq: Every day | ORAL | 0 refills | Status: DC

## 2017-02-27 MED ORDER — POLYETHYLENE GLYCOL 3350 17 G PO PACK: 17.0000 g | PACK | Freq: Two times a day (BID) | ORAL | 0 refills | Status: DC

## 2017-02-27 NOTE — Care Management Note (Signed)
Case Management Note Original note by: Leone Havenaylor, Deborah Clinton, RN 02/21/2017, 12:27 PM    Patient Details  Name: Kenneth CluckJimmy R Heiland MRN: [id number] Date of Birth: 03/17/1965  Subjective/Objective:    From home with wife,presenting with intractable nausea and vomiting and gait imbalance with neck pain.   MRI brain shows right cerebellar stroke.  He does not have insurance or PCP.  NCM scheduled [facility] at [facility] am. He will be [facility] if dc during the week, if dc over the weekend will need ast for meds with [facility].                 Action/Plan: NCM will follow for dc needs.   Expected Discharge Date:                  Expected Discharge Plan:  OP Rehab  In-House Referral:     Discharge planning Services  CM Consult, Follow-up appt scheduled, Indigent Health Clinic, Medication Assistance  Post Acute Care Choice:    Choice offered to:     DME Arranged:    DME Agency:     HH Arranged:    HH Agency:     Status of Service:  Completed, signed off  If discussed at [employer] Length of Stay Meetings, dates discussed:    Additional Comments:  02/27/17 J. Talita Recht, RN, BSN Pt medically stable for discharge home today with spouse.  PT/OT recommending OP follow up, and pt agreeable to OP follow up.  Pt declines need for RW at home.  [facility].  Referral information placed on AVS in Epic.    Quintella BatonJulie W. Michaela Shankel, RN, BSN  Trauma/Neuro ICU Case Manager [phone]

## 2017-02-27 NOTE — Progress Notes (Signed)
NEUROHOSPITALISTS STROKE TEAM - DAILY PROGRESS NOTE   SUBJECTIVE (INTERVAL HISTORY) RN is at the bedside. Pt is doing well and has no complaints Denies HA, N/V. He wants to go home. No new complaints Blood pressure is adequately controlled    OBJECTIVE Lab Results: CBC:  Recent Labs  Lab 02/24/17 0531 02/25/17 0411 02/26/17 0515  WBC 7.8 7.8 7.5  HGB 16.3 15.8 16.4  HCT 48.5 47.0 48.2  MCV 88.2 88.2 87.8  PLT 240 237 235   BMP: Recent Labs  Lab 02/22/17 0420  02/23/17 0520  02/24/17 0531  02/24/17 2315 02/25/17 0411 02/25/17 1711 02/26/17 0000 02/26/17 0515  NA 139   < > 139   < > 141   < > 141 140 140 137 137  K 3.3*  --  3.8  --  3.8  --   --  4.1  --   --  4.0  CL 102  --  104  --  108  --   --  108  --   --  101  CO2 25  --  26  --  26  --   --  25  --   --  25  GLUCOSE 134*  --  191*  --  181*  --   --  160*  --   --  112*  BUN 9  --  10  --  8  --   --  8  --   --  11  CREATININE 0.84  --  0.80  --  0.85  --   --  0.86  --   --  0.82  CALCIUM 9.1  --  8.9  --  8.9  --   --  8.8*  --   --  9.1  MG 2.0  --  1.9  --  1.9  --   --  1.8  --   --  2.1  PHOS 3.2  --  2.9  --  3.2  --   --  3.6  --   --  4.4   < > = values in this interval not displayed.   Liver Function Tests:  Recent Labs  Lab 02/22/17 0420 02/23/17 0520 02/24/17 0531 02/25/17 0411 02/26/17 0515  AST 19 16 17 19 24   ALT 12* 13* 14* 15* 23  ALKPHOS 81 73 72 69 71  BILITOT 1.1 1.0 1.0 0.9 0.7  PROT 6.9 6.6 6.2* 5.9* 6.1*  ALBUMIN 3.8 3.6 3.2* 3.2* 3.4*   No results for input(s): LIPASE, AMYLASE in the last 168 hours. Thyroid Function Studies:  No results for input(s): TSH, T4TOTAL, T3FREE, THYROIDAB in the last 72 hours. Cardiac Enzymes:  No results for input(s): CKTOTAL, CKMB, CKMBINDEX, TROPONINI in the last 168 hours. Urine Drug Screen:     Component Value Date/Time   LABOPIA POSITIVE (A) 02/20/2017 1600   COCAINSCRNUR NONE  DETECTED 02/20/2017 1600   COCAINSCRNUR NONE DETECTED 12/29/2015 0823   LABBENZ POSITIVE (A) 02/20/2017 1600   AMPHETMU NONE DETECTED 02/20/2017 1600   THCU NONE DETECTED 02/20/2017 1600   LABBARB POSITIVE (A) 02/20/2017 1600    PHYSICAL EXAM Temp:  [97.8 F (36.6 C)-98.5 F (36.9 C)] 98.5 F (36.9 C) (02/06 0800) Pulse Rate:  [66-94] 72 (02/06 1000) Resp:  [12-23] 17 (02/06 1100) BP: (138-177)/(84-109) 163/101 (02/06 1100) SpO2:  [96 %-100 %] 96 % (02/06 1000) General - Well nourished, well developed, in no apparent distress HEENT-  Normocephalic, Cardiovascular - Regular  rate and rhythm  Respiratory - Lungs clear bilaterally. No wheezing. Abdomen - soft and non-tender, BS normal Extremities- no edema or cyanosis Neurological Examination Mental Status: Alert, oriented, thought content appropriate.  Speech fluent without evidence of aphasia. Able to follow 3 step commands without difficulty. Cranial Nerves: II: Visual fields grossly normal,  III,IV, VI: ptosis not present, extra-ocular motions intact bilaterally, pupils equal, round, reactive to light and accommodation V,VII: smile symmetric, facial light touch sensation normal bilaterally VIII: hearing normal bilaterally IX,X: uvula rises symmetrically XI: bilateral shoulder shrug XII: midline tongue extension Motor: Right : Upper extremity   5/5    Left:     Upper extremity   5/5  Lower extremity   5/5     Lower extremity   5/5 Tone and bulk:normal tone throughout; no atrophy noted Sensory: Pinprick and light touch intact throughout, bilaterally Deep Tendon Reflexes: 2+ and symmetric throughout Plantars: Right: downgoing   Left: downgoing Cerebellar: normal finger-to-nose, normal rapid alternating movements and normal heel-to-shin test Gait: not tested  IMAGING: I have personally reviewed the radiological images below and agree with the radiology interpretations.  Mr Maxine Glenn Neck W Wo Contrast Result Date:  02/20/2017 IMPRESSION: 1. 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 The vessel appears to be reconstituted in the distal V2 segment. 2. Negative MRI appearance of the left vertebral artery and bilateral cervical carotid arteries. Electronically Signed   By: Odessa Fleming M.D.   On: 02/20/2017 12:54   Mr Brain Wo Contrast  Result Date: 02/19/2017 IMPRESSION: 1. 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 2. No hydrocephalus. 3. Small remote right corona radiata infarct. Electronically Signed   By: Marnee Spring M.D.   On: 02/19/2017 15:51   Ct Angio Head/ Neck W Or Wo Contrast Result Date: 02/19/2017  IMPRESSION: 1. The right vertebral artery is abnormal throughout its course. 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 2. Unfortunately the distal right vertebral artery is dominant. The left vertebral is normal but diminutive beyond the left PICA. Still, the vertebrobasilar junction, basilar artery, and other posterior circulation remain patent. 3. Stable CT appearance of the brain since 1156 hours today with no acute infarct evident. 4. Minimal carotid atherosclerosis, no carotid stenosis, and otherwise negative anterior circulation. Electronically Signed   By: Odessa Fleming M.D.   On: 02/19/2017 13:51   Ct Head Wo Contrast Result Date: 02/19/2017 IMPRESSION: 1. No evidence of acute intracranial abnormality. 2. 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 Electronically Signed   By: Sebastian Ache M.D.   On: 02/19/2017 12:10   Echocardiogram:                                               Study Conclusions  - Left ventricle: The cavity size was normal. Systolic function was   moderately reduced. The estimated ejection fraction was in the   range of 35% to 40%. Images were inadequate  for LV wall motion   assessment and cannot visualize the apex adequately. Features are   consistent with a pseudonormal left ventricular filling pattern,   with concomitant abnormal relaxation and increased filling   pressure (grade 2 diastolic dysfunction). Doppler parameters are   consistent with high ventricular  filling pressure. - Aortic valve: Trileaflet; normal thickness, mildly calcified   leaflets. - Mitral valve: There was trivial regurgitation. - Left atrium: The atrium was mildly dilated. - Pulmonary arteries: PA peak pressure: 34 mm Hg (S). - Pericardium, extracardiac: A trivial pericardial effusion was   identified posterior to the heart. - Recommendations: Limited study with definity contrast to assess   wall motion and rule out apical thrombus.  Echocardiogram Limited:       02-22-2017, 12:29:44            Study Conclusions - Technical notes: Definity to assess LV. - Left ventricle: Systolic function was mildly reduced. The   estimated ejection fraction was in the range of 45% to 50%. Impressions:- LVEF 45-50% - no mural thrombus.  CT HEAD WITHOUT CONTRAST 02/21/2017 22:45 IMPRESSION: 1. Evolving RIGHT posterior-inferior cerebellar artery territory infarct with increasing edema and mass effect, no hemorrhagic conversion. 2. New mild obstructive hydrocephalus.  CT HEAD WITHOUT CONTRAST  02/22/2017 03:01 IMPRESSION: 1. Stable distribution of right PICA infarction. Stable associated mass effect with partial effacement of fourth ventricle and quadrigeminal plate cistern. Stable ventricle size with mild hydrocephalus. 2. No new acute intracranial abnormality identified.  Ct Head Wo Contrast 02/23/2017 IMPRESSION: 1. Unchanged right PICA infarct and associated posterior fossa mass effect. Unchanged mild hydrocephalus. 2. No new intracranial abnormality.      IMPRESSION:  52 year old male with history of diabetes, hypertension, smoker admitted for dizziness,  nausea and vomiting.  He admitted on and off neck pain for the last 1 week.  CT head no hemorrhage.  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  MRI showed right cerebellum infarct and remote right CR infarct.  EF 35-40%, not able to rule out LV thrombosis.  TTE with contrast showed EF 45% and no mural thrombus.  LDL 63, TG 253 and A1c 7.7.  His TG much improved from 1 year ago.  Currently on aspirin, Plavix and Lipitor.  Patient had a stroke 12/2015 with left upper extremity numbness, gait imbalance and left-sided coordination difficulty.  MRI showed right BG/CR infarct.  Carotid Doppler negative.  EF 30-35%.  He has been following with cardiology in [place] [facility].  TG 2631, A1c 9.5.  He is not compliant with medication at home.  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  Suspected Etiology: dissection vs athero vs cardioembolic Resultant Symptoms: Ataxia, Neck Pain, H/A, Nausea Stroke Risk Factors: diabetes mellitus, hyperlipidemia, hypertension and smoking Other Stroke Risk Factors: Advanced age, Hx stroke, CAD  PLAN  02/27/2017: Continue Aspirin/ Plavix/Statin Repeat Head CT STAT if Neuro exam acutely worsens Frequent neuro checks Telemetry monitoring PT/OT/SLP Consult PM & Rehab Consult Case Management /MSW Ongoing aggressive stroke risk factor management Patient counseled to be compliant with his antithrombotic medications Patient counseled on Lifestyle modifications including, Diet, Exercise, and Stress Follow up with [facility] in 6 weeks  HX OF STROKES: Small remote right corona radiata infarct  Headache Continue Fioricet PRN  Cerebral Edema  Moderate size cerebellar stroke, Repeat CT shows Mild hydrocephalus but stable,  NA 140 today, Levels every 6 hrs, continue 3% saline.  NS consulted and no need for surgery at this  time, Appreciate assistance Central line placement by CCM - Appreciate assistanace Obtain STAT if Neuro exam acutely worsens  Leukocytosis  - resolved Remains afebrile, CXR 1/30 Negative Repeat labs in AM  Hypokalemia  Replacement in progress Repeat labs in AM  HYPERTENSION: Unstable, some elevated B/P's noted overnight Permissive hypertension - SBP Goal now less than 180 Labetalol PRN Long term BP goal normotensive. Home Meds: Lisinopril restarted 02/22/2017  HYPERLIPIDEMIA:    Component Value Date/Time   CHOL 141 02/21/2017 0340   CHOL 174 07/30/2014 0902   TRIG 112 02/23/2017 1115   HDL 27 (L) 02/21/2017 0340   HDL 31 (L) 07/30/2014 0902   CHOLHDL 5.2 02/21/2017 0340   VLDL 51 (H) 02/21/2017 0340   LDLCALC 63 02/21/2017 0340   LDLCALC 111 (H) 07/30/2014 0902  Home Meds:  Lipitor 80 mg LDL  goal < 70 Continued on Lipitor to 80 mg daily Continue statin at discharge  DIABETES: Lab Results  Component Value Date   HGBA1C 7.7 (H) 02/19/2017  HgbA1c goal < 7.0 Currently on: Novolog Continue CBG monitoring and SSI to maintain glucose 140-180 mg/dl DM education   TOBACCO ABUSE Current smoker Smoking cessation counseling provided Nicotine patch provided  Other Active Problems: Active Problems:   Stroke (cerebrum) (HCC)   Ischemic stroke (HCC)   Type 2 diabetes mellitus with hyperlipidemia (HCC)   Uncontrolled type 2 diabetes mellitus with hyperglycemia (HCC)   Tobacco dependence   Vertebral artery dissection (HCC)   Obstructive hydrocephalus   Encounter for central line placement    Hospital day # 7 VTE prophylaxis:  Heparin Diet : Fall precautions Diet Heart Room service appropriate? Yes; Fluid consistency: Thin   FAMILY UPDATES:  family at bedside  TEAM UPDATES: No att. providers found   Prior Home Stroke Medications:  aspirin 81 mg daily  Discharge Stroke Meds:  Please discharge patient on aspirin 325 mg daily   Disposition: 01-Home or Self  Care Therapy Recs:               PENDING Follow Up:  Follow-up Information    [facility] Follow up on 03/04/2017.   Why:  9 am for hospital follow up Contact information: [address] [id number] mc [place] [phone]       Nilda RiggsMartin, Nancy Carolyn, NP. Schedule an appointment as soon as possible for a visit in 6 week(s).   Specialty:  Family Medicine Contact information: [address] [place] [id number] [phone]        [facility]   Specialty:  Rehabilitation Why:  Please call for appointments for outpatient PT and OT.   Contact information: [address] [id number] mc [place] [phone]       [facility]. Schedule an appointment as soon as possible for a visit in 2 week(s).   Contact information: [address] [place] [phone]         Anola Gurneyhauvin, Robert, [place] -PCP Follow up in 1-2 weeks           The patient and discharged home today on dual antiplatelet therapy and statin and follow-up as an outpatient in the stroke clinic in 6 weeks. Long discussion with the patient and wife and Dr. Jacques EarthlyNettie and answered questions. Patient is also participating in the stroke atrial fibrillation trial and has been randomized to the medical treatment. Follow up in the cardiovascular research clinic as scheduled. Greater than 50% time during this 25 minute visit was spent on counseling and coordination of care about his stroke and need for aggressive risk factor modification and answered questions  Delia Heady, MD Stroke Neurology 02/27/2017 2:13 PM       To contact Stroke Continuity provider, please refer to [email or web address]. After hours, contact General Neurology

## 2017-02-27 NOTE — Progress Notes (Signed)
  Speech Language Pathology Treatment: Cognitive-Linquistic  Patient Details Name: Kenneth CluckJimmy R [name] MRN: [id number] DOB: 09/22/1965 Today's Date: 02/27/2017 Time: [id number] SLP Time Calculation (min) (ACUTE ONLY): 21 min  Assessment / Plan / Recommendation Clinical Impression  Wife present for session.  Pt irritable, expressing desire to go home ASAP.  States he is concerned about paying for outpatient therapies.  We reviewed results from cognitive assessments, which revealed deficits in higher-level attention and complex problem-solving.  Continues to demonstrate impulsivity and difficulty with prospective memory.  He denies any changes in function. We discussed the likelihood that many of these difficulties might not be revealed until pt returns to home/work environment.  We discussed the value of being aware of their presence and strategies to reduce stress.  Pt is adamant that he does not want OP therapies; he's concerned about payment, but also denies impact of stroke. If he is willing, he would benefit from OP SLP.  Our services will sign off at this time.  HPI HPI: Pt is a 51 y.o.malepresenting with intractable nausea and vomitingand gait imbalance with neck pain x 1 week. 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 PMH includes HTN, DM, CAD.      SLP Plan  Discharge SLP treatment due to (comment)(pt does not want F/U for cognition)       Recommendations                   Follow up Recommendations: Outpatient SLP(if pt is willing) Plan: Discharge SLP treatment due to (comment)(pt does not want F/U for cognition)       GO                Blenda MountsCouture, Teria Khachatryan Laurice 02/27/2017, 10:42 AM

## 2017-02-27 NOTE — Progress Notes (Signed)
Pt refused AM labs.  Attempted to try to reschedule blood draw at a better time, but pt became irritable saying "no one is sticking me anymore!"

## 2017-02-27 NOTE — Progress Notes (Signed)
Pt refusing labs, CBG monitoring, BP management, and threatening to leave AMA.  Refusing to have RN help with ambulation, despite his unsteady gait.  Pt educated about safety policies and still refusing help, walking around the room against RN recommendations.  Pt screaming at his wife and staff, demanding to have discharge paperwork.  MD has been paged and is currently working on the discharge summary. Pt reports that he will leave by 1330 AMA if discharge paperwork is not complete by then. MD aware of this as well.

## 2017-02-27 NOTE — Discharge Summary (Signed)
Physician Discharge Summary  Kenneth [name] NFA:[id number] DOB: 12-17-1965 DOA: 02/20/2017  PCP: Anola Gurney, PA  Admit date: 02/20/2017 Discharge date: 02/27/2017  Admitted From: Home Disposition: Home  Recommendations for Outpatient Follow-up:  1. Follow up with PCP in 1 week 2. Follow up with Neurology in 6 weeks 3. Follow up with Cardiology in 2 weeks for heart failure 4. Please obtain BMP/CBC in one week 5. Blood pressure control 6. Please follow up on the following pending results: None  Home Health: Outpatient PT/OT Equipment/Devices: None  Discharge Condition: Stable CODE STATUS: Full code Diet recommendation: Heart healthy   Brief/Interim Summary:  Admission HPI written by Arther Dames, MD   HPI:                                                                                                                                       Kenneth [name] is an 52 y.o. male with PMH of HTN, DM presents naseua, vomiting, dizziness to [facility] regional hospital. His symptoms began at 9.30 when walking to his car and developed sudden onset nausea and vomiting and gait imablance. He was taken to [facility] regional and evaluated by Dr Jodi Mourning, a neurologist. NIHSS was 1. He did not get TPA.  CT head showed no hemorrhage.  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 He has been complaining on and off neck pain x 1 week. Denies any recent trauma to neck, chiropractic manipulation, lift heavy weights.  He was transferred to [facility] for diagnostic cerebral angiogram. 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 Still smokes.   On arrival patient is stable, complaining of headache.     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 Neurology and Neurosurgery consulted for management. Edema and mental status improved with  hypertonic saline treatment. Neurosurgery evaluated and recommended no surgery. Patient weaned off of hypertonic saline. Sodium within normal limits. Headaches improved.  Cerebral Edema/Mild Obstructive Hydrocephalus As Above  Right Vertebral Artery Dissection As Above. Blood pressure control.  Acute Headache Improved. Secondary to edema.  HTN Allowed permissive HTN. Started on Lisinopril. Blood pressure uncontrolled. Recommend continued adjustment as an outpatient.  HLD LDL at goal of 63 (less than 70). Continued Lipitor 80 mg daily.  Diabetes Mellitus Type 2  Hemoglobin A1c is 7.7. Continue metformin and glimepiride on discharge, in addition to diet modification.  Tobacco Abuse/Dependence Smoking Cessation Counseling given. This was discussed with the patient and should be reviewed on an ongoing basis.   CAD History of Stenting. Aspirin and Plavix on discharge in addition to Lipitor.  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 Asymptomatic. Started on lisinopril. Will need outpatient cardiology follow-up.  Leukocytosis, improving  Likely secondary to stroke and edema. Resolved prior to discharge.  Erythrocytosis, slightly  improved Chronic and secondary to tobacco abuse. Stable.  Constipation Treated with Senna and Miralax.   Discharge Diagnoses:  Active Problems:   Stroke (cerebrum) (HCC)   Ischemic stroke (HCC)   Type 2 diabetes mellitus with hyperlipidemia (HCC)   Uncontrolled type 2 diabetes mellitus with hyperglycemia (HCC)   Tobacco dependence   Vertebral artery dissection (HCC)   Obstructive hydrocephalus   Encounter for central line placement    Discharge Instructions  Discharge Instructions    Ambulatory referral to Neurology   Complete by:  As directed    An appointment is requested in approximately: 6 weeks Follow up with stroke clinic Darrol Angel preferred, if not available, then consider Sylvie Farrier, [facility] or  Lucia Gaskins whoever is available) at [facility] in about 6-8 weeks. Thanks.   Ambulatory referral to Occupational Therapy   Complete by:  As directed    Ambulatory referral to Physical Therapy   Complete by:  As directed      Allergies as of 02/27/2017   No Known Allergies     Medication List    STOP taking these medications   gemfibrozil 600 MG tablet Commonly known as:  LOPID     TAKE these medications   aspirin 325 MG EC tablet Take 1 tablet (325 mg total) by mouth daily. Start taking on:  02/28/2017 What changed:    medication strength  how much to take   atorvastatin 80 MG tablet Commonly known as:  LIPITOR Take 1 tablet (80 mg total) by mouth daily at 6 PM. What changed:  when to take this   buPROPion 100 MG 12 hr tablet Commonly known as:  WELLBUTRIN SR Take 100 mg by mouth 2 (two) times daily.   clopidogrel 75 MG tablet Commonly known as:  PLAVIX Take 1 tablet (75 mg total) by mouth daily. Start taking on:  02/28/2017   glimepiride 4 MG tablet Commonly known as:  AMARYL Take 1 tablet (4 mg total) by mouth daily with breakfast.   lisinopril 40 MG tablet Commonly known as:  PRINIVIL,ZESTRIL Take 1 tablet (40 mg total) by mouth daily. What changed:    medication strength  how much to take  additional instructions   metFORMIN 1000 MG tablet Commonly known as:  GLUCOPHAGE Take 1,000 mg by mouth 2 (two) times daily with a meal.   nicotine 21 mg/24hr patch Commonly known as:  NICODERM CQ - dosed in mg/24 hours Place 1 patch (21 mg total) onto the skin daily.   polyethylene glycol packet Commonly known as:  MIRALAX / GLYCOLAX Take 17 g by mouth 2 (two) times daily.      Follow-up Information    [facility] Follow up on 03/04/2017.   Why:  9 am for hospital follow up Contact information: [address] [id number] mc [place] [address] [phone]       Nilda Riggs, NP. Schedule an appointment as soon as  possible for a visit in 6 week(s).   Specialty:  Family Medicine Contact information: [address] [place] [address] [phone]        [facility] Follow up.   Specialty:  Rehabilitation Why:  Please call for appointments for outpatient PT and OT.   Contact information: [address] [id number] mc [place] [address] [phone]         No Known Allergies  Consultations:  Neurology  Neurosurgery   Procedures/Studies: Ct Angio Head W Or Wo  Contrast  Result Date: 02/19/2017 CLINICAL DATA:  52 year old male with severe posterior neck pain which is non radiating. Nausea and vomiting beginning abruptly today. EXAM: CT ANGIOGRAPHY HEAD AND NECK TECHNIQUE: Multidetector CT imaging of the head and neck was performed using the standard protocol during bolus administration of intravenous contrast. Multiplanar CT image reconstructions and MIPs were obtained to evaluate the vascular anatomy. Carotid stenosis measurements (when applicable) are obtained utilizing NASCET criteria, using the distal internal carotid diameter as the denominator. CONTRAST:  75mL ISOVUE-370 IOPAMIDOL (ISOVUE-370) INJECTION 76% COMPARISON:  Head CT without contrast 1156 hours today. Brain MRI 12/29/2015. FINDINGS: CTA NECK Skeleton: Preserved cervical lordosis, perhaps mildly exaggerated. Moderate to severe chronic facet hypertrophy on the right at C2-C3 with trace vacuum facet. Minimal to mild cervical spine degeneration elsewhere. No acute osseous abnormality identified. Upper chest: Negative visible lungs.  Normal superior mediastinum. Other neck: Thyroid is negative for age; there a subcentimeter hypodense nodule in the right lobe which does not meet consensus criteria for further evaluation. Negative neck soft tissues otherwise. No lymphadenopathy. Aortic arch: 3 vessel arch configuration. Minimal arch atherosclerosis. Right  carotid system: Negative. Left carotid system: Mild soft and calcified atherosclerosis at the left carotid bifurcation. No associated stenosis. Otherwise negative. Vertebral arteries: Normal proximal right subclavian artery. The very proximal right vertebral artery origin is patent, but enhancement of the vessel abruptly terminates in a triangular shaped area just beyond the origin (series 7, image 121). The remainder of the right V1 segment is not enhancing, but there is intermittent enhancement in the right V 2 segment (such as series 6, image 255 where the lumen appears irregular). The distal V2 segment is enhancing with an oblique or intraluminal filling defect (series 6, image 209). Approximately half of the vessel lumen appears to be enhancing at the C2 vertebral level. The V3 segment is poorly enhancing. The V4 segment is described below. Normal proximal left subclavian artery and left vertebral artery origin. The left vertebral artery is enhancing and appears normal to the skull base. CTA HEAD Posterior circulation: The distal left vertebral artery appears diminutive beyond the left PICA origin. The more dominant appearing distal right vertebral artery demonstrates poor enhancement in the proximal V4 segment but is moderately enhancing distally, and the vertebrobasilar junction is patent. The basilar artery is patent. SCA and PCA origins are patent. There is a fetal type right PCA origin. The left posterior communicating artery is also present. Bilateral PCA branches are within normal limits. Anterior circulation: Both ICA siphons are patent. Mild calcified plaque only on the right. No siphon stenosis. Normal ophthalmic and posterior communicating artery origins. Patent carotid termini. Normal MCA and ACA origins. Anterior communicating artery and bilateral ACA branches are within normal limits. Bilateral MCA M1 segments, MCA bifurcations, and bilateral MCA branches are within normal limits. Venous sinuses:  Patent. Anatomic variants: Dominant appearance of the distal right vertebral artery, the left is diminutive beyond the left PICA origin. Fetal type right PCA origin. Delayed phase: Posterior fossa gray-white matter differentiation remains normal. Stable gray-white matter differentiation elsewhere. No acute infarct is evident by C No abnormal enhancement identified. Review of the MIP images confirms the above findings IMPRESSION: 1. The right vertebral artery is abnormal throughout its course. 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 2. Unfortunately the distal right vertebral artery is dominant. The left vertebral is normal but diminutive beyond the left PICA. Still, the vertebrobasilar junction, basilar  artery, and other posterior circulation remain patent. 3. Stable CT appearance of the brain since 1156 hours today with no acute infarct evident. 4. Minimal carotid atherosclerosis, no carotid stenosis, and otherwise negative anterior circulation. Electronically Signed   By: Odessa FlemingH  Hall M.D.   On: 02/19/2017 13:51   Dg Chest 2 View  Result Date: 02/20/2017 CLINICAL DATA:  52 year old male with history of headache.  Stroke. EXAM: CHEST  2 VIEW COMPARISON:  Chest x-ray 12/29/2015. FINDINGS: Lung volumes are normal. No consolidative airspace disease. No pleural effusions. No pneumothorax. No pulmonary nodule or mass noted. Pulmonary vasculature and the cardiomediastinal silhouette are within normal limits. IMPRESSION: No radiographic evidence of acute cardiopulmonary disease. Electronically Signed   By: Trudie Reedaniel  Entrikin M.D.   On: 02/20/2017 10:23   Ct Head Wo Contrast  Result Date: 02/25/2017 CLINICAL DATA:  Follow-up posterior-inferior cerebellar artery territory infarct, history of vertebral artery dissection. EXAM: CT HEAD WITHOUT CONTRAST TECHNIQUE: Contiguous axial images were obtained from the base of the skull through  the vertex without intravenous contrast. COMPARISON:  CT HEAD February 23, 2017 FINDINGS: BRAIN: RIGHT inferior cerebellar cytotoxic edema with slightly decreased mass effect. Mildly effaces suprasellar cistern. Slightly dilated third ventricles and lateral ventricle. Old RIGHT corona radiata to internal capsule lacunar infarct. No intraparenchymal hemorrhage or acute large vascular territory infarcts. No abnormal extra-axial fluid collections. VASCULAR: Unremarkable. SKULL/SOFT TISSUES: No skull fracture. Old nondisplaced RIGHT nasal bone fracture. No significant soft tissue swelling. ORBITS/SINUSES: The included ocular globes and orbital contents are normal.The mastoid aircells and included paranasal sinuses are well-aerated. OTHER: None. IMPRESSION: 1. Evolving RIGHT PICA infarct without hemorrhagic conversion. 2. Decreasing, minimal residual hydrocephalus. Electronically Signed   By: Awilda Metroourtnay  Bloomer M.D.   On: 02/25/2017 05:38   Ct Head Wo Contrast  Result Date: 02/23/2017 CLINICAL DATA:  Stroke follow-up. EXAM: CT HEAD WITHOUT CONTRAST TECHNIQUE: Contiguous axial images were obtained from the base of the skull through the vertex without intravenous contrast. COMPARISON:  02/22/2017 FINDINGS: Brain: Right PICA territory infarct is again noted with unchanged edema and associated mass effect. Mild leftward midline shift in the posterior fossa is unchanged, as is partial effacement of the fourth ventricle and quadrigeminal cistern. The lateral and third ventricles are unchanged in size and remain mildly dilated compared to the CT at the time of presentation on 02/19/2017. There is no evidence of new infarct, intracranial hemorrhage, or extra-axial fluid collection. A chronic lacunar infarct is again noted in the posterior right internal capsule/corona radiata. Vascular: No hyperdense vessel. Skull: No fracture or focal osseous lesion. Sinuses/Orbits: Visualized paranasal sinuses and mastoid air cells are clear.  Orbits are unremarkable. Other: None. IMPRESSION: 1. Unchanged right PICA infarct and associated posterior fossa mass effect. Unchanged mild hydrocephalus. 2. No new intracranial abnormality. Electronically Signed   By: Sebastian AcheAllen  Grady M.D.   On: 02/23/2017 08:29   Ct Head Wo Contrast  Result Date: 02/22/2017 CLINICAL DATA:  52 y/o  M; acute stroke for follow-up. EXAM: CT HEAD WITHOUT CONTRAST TECHNIQUE: Contiguous axial images were obtained from the base of the skull through the vertex without intravenous contrast. COMPARISON:  02/21/2017 CT head.  02/19/2017 MRI head. FINDINGS: Brain: Stable distribution of right PICA infarction. Stable edema and associated mass effect with right to left midline shift in the posterior fossa, mass effect on right posterior medulla, partial effacement of quadrigeminal plate cistern, and partial effacement of the fourth ventricle. Stable size of the lateral and third ventricles. No new large acute stroke, hemorrhage, or  additional focus of mass effect identified. Stable incidental empty sella turcica. Vascular: No hyperdense vessel or unexpected calcification. Skull: Normal. Negative for fracture or focal lesion. Sinuses/Orbits: No acute finding. Other: None. IMPRESSION: 1. Stable distribution of right PICA infarction. Stable associated mass effect with partial effacement of fourth ventricle and quadrigeminal plate cistern. Stable ventricle size with mild hydrocephalus. 2. No new acute intracranial abnormality identified. Electronically Signed   By: Mitzi Hansen M.D.   On: 02/22/2017 03:01   Ct Head Wo Contrast  Result Date: 02/21/2017 CLINICAL DATA:  Follow-up code stroke. History of RIGHT vertebral artery dissection, hypertension, diabetes and stroke. EXAM: CT HEAD WITHOUT CONTRAST TECHNIQUE: Contiguous axial images were obtained from the base of the skull through the vertex without intravenous contrast. COMPARISON:  MRI of the head February 19, 2017 FINDINGS: BRAIN:  Evolving RIGHT inferior cerebellar infarct with cytotoxic edema, regional mass effect and mild midline shift. The obex appears effaced, effaced foramen of Luschka. Early obstructive hydrocephalus (diameter of bifrontal horns was 3.8 cm, now 4.5 cm with slightly enlarged temporal horns). No frank interstitial edema/transependymal flow cerebral spinal fluid. Old RIGHT corona radiata/internal capsule lacunar infarct. No intraparenchymal hemorrhage. No abnormal extra-axial fluid collections. Effaced RIGHT quadrigeminal cistern. Empty sella. VASCULAR: Unremarkable. SKULL: No skull fracture. No significant scalp soft tissue swelling. SINUSES/ORBITS: The mastoid air-cells and included paranasal sinuses are well-aerated.The included ocular globes and orbital contents are non-suspicious. OTHER: None. IMPRESSION: 1. Evolving RIGHT posterior-inferior cerebellar artery territory infarct with increasing edema and mass effect, no hemorrhagic conversion. 2. New mild obstructive hydrocephalus. Acute findings text paged to Dr.Lindzen, Neurology via [employer] secure system on 02/21/2017 at 10:43 pm, including interpreting physician's phone number. Electronically Signed   By: Awilda Metro M.D.   On: 02/21/2017 22:45   Ct Head Wo Contrast  Result Date: 02/19/2017 CLINICAL DATA:  Worst headache of life with nausea and vomiting. Prior stroke. EXAM: CT HEAD WITHOUT CONTRAST TECHNIQUE: Contiguous axial images were obtained from the base of the skull through the vertex without intravenous contrast. COMPARISON:  Head CT and MRI 12/29/2015 FINDINGS: Brain: There is a chronic infarct in the posterior limb of the right internal capsule extending to the posterior lentiform nucleus, acute on the prior MRI. There is no evidence of acute infarct, intracranial hemorrhage, mass, midline shift, or extra-axial fluid collection. The ventricles and sulci are normal. A partially empty sella is again noted. Vascular: No hyperdense vessel. Skull: No  fracture or focal osseous lesion. Sinuses/Orbits: Visualized paranasal sinuses and mastoid air cells are clear. Orbits are unremarkable. Other: None. IMPRESSION: 1. No evidence of acute intracranial abnormality. 2. 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 Electronically Signed   By: Sebastian Ache M.D.   On: 02/19/2017 12:10   Ct Angio Neck W And/or Wo Contrast  Result Date: 02/19/2017 CLINICAL DATA:  52 year old male with severe posterior neck pain which is non radiating. Nausea and vomiting beginning abruptly today. EXAM: CT ANGIOGRAPHY HEAD AND NECK TECHNIQUE: Multidetector CT imaging of the head and neck was performed using the standard protocol during bolus administration of intravenous contrast. Multiplanar CT image reconstructions and MIPs were obtained to evaluate the vascular anatomy. Carotid stenosis measurements (when applicable) are obtained utilizing NASCET criteria, using the distal internal carotid diameter as the denominator. CONTRAST:  75mL ISOVUE-370 IOPAMIDOL (ISOVUE-370) INJECTION 76% COMPARISON:  Head CT without contrast 1156 hours today. Brain MRI 12/29/2015. FINDINGS: CTA NECK Skeleton: Preserved cervical lordosis, perhaps mildly exaggerated. Moderate to severe chronic facet hypertrophy on the right at  C2-C3 with trace vacuum facet. Minimal to mild cervical spine degeneration elsewhere. No acute osseous abnormality identified. Upper chest: Negative visible lungs.  Normal superior mediastinum. Other neck: Thyroid is negative for age; there a subcentimeter hypodense nodule in the right lobe which does not meet consensus criteria for further evaluation. Negative neck soft tissues otherwise. No lymphadenopathy. Aortic arch: 3 vessel arch configuration. Minimal arch atherosclerosis. Right carotid system: Negative. Left carotid system: Mild soft and calcified atherosclerosis at the left carotid bifurcation. No associated stenosis. Otherwise negative. Vertebral arteries:  Normal proximal right subclavian artery. The very proximal right vertebral artery origin is patent, but enhancement of the vessel abruptly terminates in a triangular shaped area just beyond the origin (series 7, image 121). The remainder of the right V1 segment is not enhancing, but there is intermittent enhancement in the right V 2 segment (such as series 6, image 255 where the lumen appears irregular). The distal V2 segment is enhancing with an oblique or intraluminal filling defect (series 6, image 209). Approximately half of the vessel lumen appears to be enhancing at the C2 vertebral level. The V3 segment is poorly enhancing. The V4 segment is described below. Normal proximal left subclavian artery and left vertebral artery origin. The left vertebral artery is enhancing and appears normal to the skull base. CTA HEAD Posterior circulation: The distal left vertebral artery appears diminutive beyond the left PICA origin. The more dominant appearing distal right vertebral artery demonstrates poor enhancement in the proximal V4 segment but is moderately enhancing distally, and the vertebrobasilar junction is patent. The basilar artery is patent. SCA and PCA origins are patent. There is a fetal type right PCA origin. The left posterior communicating artery is also present. Bilateral PCA branches are within normal limits. Anterior circulation: Both ICA siphons are patent. Mild calcified plaque only on the right. No siphon stenosis. Normal ophthalmic and posterior communicating artery origins. Patent carotid termini. Normal MCA and ACA origins. Anterior communicating artery and bilateral ACA branches are within normal limits. Bilateral MCA M1 segments, MCA bifurcations, and bilateral MCA branches are within normal limits. Venous sinuses: Patent. Anatomic variants: Dominant appearance of the distal right vertebral artery, the left is diminutive beyond the left PICA origin. Fetal type right PCA origin. Delayed phase:  Posterior fossa gray-white matter differentiation remains normal. Stable gray-white matter differentiation elsewhere. No acute infarct is evident by C No abnormal enhancement identified. Review of the MIP images confirms the above findings IMPRESSION: 1. The right vertebral artery is abnormal throughout its course. 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 2. Unfortunately the distal right vertebral artery is dominant. The left vertebral is normal but diminutive beyond the left PICA. Still, the vertebrobasilar junction, basilar artery, and other posterior circulation remain patent. 3. Stable CT appearance of the brain since 1156 hours today with no acute infarct evident. 4. Minimal carotid atherosclerosis, no carotid stenosis, and otherwise negative anterior circulation. Electronically Signed   By: Odessa Fleming M.D.   On: 02/19/2017 13:51   Mr Maxine Glenn Neck W Wo Contrast  Result Date: 02/20/2017 CLINICAL DATA:  52 year old male with abnormal right vertebral artery on CTA yesterday following presentation with neck pain, nausea vomiting most compatible with right vertebral artery dissection. Brain MRI yesterday revealing right PICA territory infarct. EXAM: MRA NECK WITHOUT AND WITH CONTRAST TECHNIQUE: Multiplanar and multiecho pulse sequences of the neck were obtained without and with intravenous contrast. Angiographic images of the neck  were obtained using MRA technique without and with intravenous contrast. CONTRAST:  15mL MULTIHANCE GADOBENATE DIMEGLUMINE 529 MG/ML IV SOLN COMPARISON:  Brain MRI and CTA head and neck 02/20/2016. FINDINGS: Precontrast time-of-flight images reveal lost antegrade flow signal throughout the right vertebral artery in the neck and at the skull base. Preserved antegrade flow signal in the left vertebral artery and both carotid arteries throughout the neck. Precontrast axial T1 spin echo image with fat  saturation demonstrates flow voids in both common carotid arteries and the left vertebral artery, but absent flow void in the proximal right vertebral artery, and asymmetric intent intrinsic T1 hyperintensity within the lumen of the distal right V2 segment in the upper neck (series 5, image 13). Post-contrast neck MRA images reveal a 3 vessel arch configuration with normal great vessel origins. There is a small triangular shaped area of abrupt termination of enhancement in the right vertebral artery origin similar to the CTA appearance yesterday (series 16109, image 35). There is no enhancement in of the right vertebral artery V1 segment, and poor irregular enhancement throughout the proximal and mid right vertebral artery V2 segment. Enhancement of the vessel appears more normal in the distal V2 segment, while the V3 and V4 segments appear to be normally enhancing aside from mild vessel irregularity. The contralateral left vertebral artery is within normal limits. The bilateral cervical carotid arteries appear normal. IMPRESSION: 1. 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 The vessel appears to be reconstituted in the distal V2 segment. 2. Negative MRI appearance of the left vertebral artery and bilateral cervical carotid arteries. Electronically Signed   By: Odessa Fleming M.D.   On: 02/20/2017 12:54   Mr Brain Wo Contrast  Result Date: 02/19/2017 CLINICAL DATA:  Stroke follow-up.  Nausea and vomiting. EXAM: MRI HEAD WITHOUT CONTRAST TECHNIQUE: Multiplanar, multiecho pulse sequences of the brain and surrounding structures were obtained without intravenous contrast. COMPARISON:  12/29/2015 FINDINGS: Brain: Large acute infarct in the inferior right cerebellum, the pica territory. No superimposed hemorrhage. Remote perforator infarct affecting the right posterior corona radiata. No hydrocephalus, shift, or masslike finding. Partially empty  sella, incidental. Vascular: Abnormal right vertebral flow void, known from preceding CTA where there was changes of dissection. Skull and upper cervical spine: Negative for marrow lesion Sinuses/Orbits: Negative Other: These results were communicated to Dr Thad Ranger at 3:41 pmon 1/29/2019by text page via the [facility] messaging system. IMPRESSION: 1. 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 2. No hydrocephalus. 3. Small remote right corona radiata infarct. Electronically Signed   By: Marnee Spring M.D.   On: 02/19/2017 15:51   Dg Chest Port 1 View  Result Date: 02/22/2017 CLINICAL DATA:  Central line placement EXAM: PORTABLE CHEST 1 VIEW COMPARISON:  02/20/2017 FINDINGS: 1527 hours. Left IJ central line is new in the interval with tip overlying the SVC/RA junction. No evidence for left-sided pneumothorax. Lungs clear. The cardiopericardial silhouette is within normal limits for size. The visualized bony structures of the thorax are intact. Telemetry leads overlie the chest. IMPRESSION: Left IJ central line tip overlies the SVC/RA junction. No evidence for pneumothorax. Electronically Signed   By: Kennith Center M.D.   On: 02/22/2017 15:35    Transthoracic Echocardiogram (02/20/2017) Study Conclusions  - Left ventricle: The cavity size was normal. Systolic function was   moderately reduced. The estimated ejection fraction was in the   range of 35% to 40%. Images were inadequate for LV  wall motion   assessment and cannot visualize the apex adequately. Features are   consistent with a pseudonormal left ventricular filling pattern,   with concomitant abnormal relaxation and increased filling   pressure (grade 2 diastolic dysfunction). Doppler parameters are   consistent with high ventricular filling pressure. - Aortic valve: Trileaflet; normal thickness, mildly calcified   leaflets. - Mitral valve: There was trivial regurgitation. - Left atrium: The atrium was  mildly dilated. - Pulmonary arteries: PA peak pressure: 34 mm Hg (S). - Pericardium, extracardiac: A trivial pericardial effusion was   identified posterior to the heart. - Recommendations: Limited study with definity contrast to assess   wall motion and rule out apical thrombus.  Recommendations:  Limited study with definity contrast to assess wall motion and rule out apical thrombus.   Transthoracic Echocardiogram (02/22/2017) Study Conclusions  - Technical notes: Definity to assess LV. - Left ventricle: Systolic function was mildly reduced. The   estimated ejection fraction was in the range of 45% to 50%.  Impressions:  - LVEF 45-50% - no mural thrombus.   Subjective: No chest pain, dyspnea or palpitations  Discharge Exam: Vitals:   02/27/17 1000 02/27/17 1100  BP: (!) 162/98 (!) 163/101  Pulse:    Resp:    Temp:    SpO2:     Vitals:   02/27/17 0800 02/27/17 0900 02/27/17 1000 02/27/17 1100  BP: (!) 153/99 (!) 158/100 (!) 162/98 (!) 163/101  Pulse: 78     Resp: 19 (!) 22    Temp: 98.5 F (36.9 C)     TempSrc: Oral     SpO2: 100%     Weight:      Height:        General: Pt is alert, awake, not in acute distress Cardiovascular: RRR, S1/S2 +, no rubs, no gallops Respiratory: CTA bilaterally, no wheezing, no rhonchi Abdominal: Soft, NT, ND, bowel sounds + Extremities: no edema, no cyanosis    The results of significant diagnostics from this hospitalization (including imaging, microbiology, ancillary and laboratory) are listed below for reference.     Microbiology: Recent Results (from the past 240 hour(s))  MRSA PCR Screening     Status: None   Collection Time: 02/20/17  5:30 AM  Result Value Ref Range Status   MRSA by PCR NEGATIVE NEGATIVE Final    Comment:        The GeneXpert MRSA Assay (FDA approved for NASAL specimens only), is one component of a comprehensive MRSA colonization surveillance program. It is not intended to diagnose  MRSA infection nor to guide or monitor treatment for MRSA infections.      Labs: BNP (last 3 results) No results for input(s): BNP in the last 8760 hours. Basic Metabolic Panel: Recent Labs  Lab 02/22/17 0420  02/23/17 0520  02/24/17 0531  02/24/17 2315 02/25/17 0411 02/25/17 1711 02/26/17 0000 02/26/17 0515  NA 139   < > 139   < > 141   < > 141 140 140 137 137  K 3.3*  --  3.8  --  3.8  --   --  4.1  --   --  4.0  CL 102  --  104  --  108  --   --  108  --   --  101  CO2 25  --  26  --  26  --   --  25  --   --  25  GLUCOSE 134*  --  191*  --  181*  --   --  160*  --   --  112*  BUN 9  --  10  --  8  --   --  8  --   --  11  CREATININE 0.84  --  0.80  --  0.85  --   --  0.86  --   --  0.82  CALCIUM 9.1  --  8.9  --  8.9  --   --  8.8*  --   --  9.1  MG 2.0  --  1.9  --  1.9  --   --  1.8  --   --  2.1  PHOS 3.2  --  2.9  --  3.2  --   --  3.6  --   --  4.4   < > = values in this interval not displayed.   Liver Function Tests: Recent Labs  Lab 02/22/17 0420 02/23/17 0520 02/24/17 0531 02/25/17 0411 02/26/17 0515  AST 19 16 17 19 24   ALT 12* 13* 14* 15* 23  ALKPHOS 81 73 72 69 71  BILITOT 1.1 1.0 1.0 0.9 0.7  PROT 6.9 6.6 6.2* 5.9* 6.1*  ALBUMIN 3.8 3.6 3.2* 3.2* 3.4*   No results for input(s): LIPASE, AMYLASE in the last 168 hours. No results for input(s): AMMONIA in the last 168 hours. CBC: Recent Labs  Lab 02/22/17 0420 02/23/17 0520 02/24/17 0531 02/25/17 0411 02/26/17 0515  WBC 11.9* 9.0 7.8 7.8 7.5  NEUTROABS 9.2* 6.9 5.2 4.6 4.6  HGB 17.5* 16.9 16.3 15.8 16.4  HCT 49.8 49.7 48.5 47.0 48.2  MCV 85.9 87.3 88.2 88.2 87.8  PLT 236 223 240 237 235   Cardiac Enzymes: No results for input(s): CKTOTAL, CKMB, CKMBINDEX, TROPONINI in the last 168 hours. BNP: Invalid input(s): POCBNP CBG: Recent Labs  Lab 02/25/17 2111 02/26/17 0828 02/26/17 1144 02/26/17 1711 02/26/17 2207  GLUCAP 112* 141* 196* 185* 180*   D-Dimer No results for input(s):  DDIMER in the last 72 hours. Hgb A1c No results for input(s): HGBA1C in the last 72 hours. Lipid Profile No results for input(s): CHOL, HDL, LDLCALC, TRIG, CHOLHDL, LDLDIRECT in the last 72 hours. Thyroid function studies No results for input(s): TSH, T4TOTAL, T3FREE, THYROIDAB in the last 72 hours.  Invalid input(s): FREET3 Anemia work up No results for input(s): VITAMINB12, FOLATE, FERRITIN, TIBC, IRON, RETICCTPCT in the last 72 hours. Urinalysis    Component Value Date/Time   COLORURINE COLORLESS (A) 12/29/2015 0823   APPEARANCEUR CLEAR (A) 12/29/2015 0823   LABSPEC 1.001 (L) 12/29/2015 0823   PHURINE 6.0 12/29/2015 0823   GLUCOSEU >=500 (A) 12/29/2015 0823   HGBUR SMALL (A) 12/29/2015 0823   BILIRUBINUR NEGATIVE 12/29/2015 0823   KETONESUR NEGATIVE 12/29/2015 0823   PROTEINUR NEGATIVE 12/29/2015 0823   NITRITE NEGATIVE 12/29/2015 0823   LEUKOCYTESUR NEGATIVE 12/29/2015 0823   Sepsis Labs Invalid input(s): PROCALCITONIN,  WBC,  LACTICIDVEN Microbiology Recent Results (from the past 240 hour(s))  MRSA PCR Screening     Status: None   Collection Time: 02/20/17  5:30 AM  Result Value Ref Range Status   MRSA by PCR NEGATIVE NEGATIVE Final    Comment:        The GeneXpert MRSA Assay (FDA approved for NASAL specimens only), is one component of a comprehensive MRSA colonization surveillance program. It is not intended to diagnose MRSA infection nor to guide or monitor treatment for MRSA infections.      SIGNED:  Jacquelin Hawking, MD [facility] 02/27/2017, 12:54 PM Pager [phone]  If 7PM-7AM, please contact night-coverage [email or web address] Password [email or web address]

## 2017-03-04 ENCOUNTER — Ambulatory Visit: Payer: Self-pay | Admitting: Family Medicine

## 2017-03-05 ENCOUNTER — Emergency Department
Admission: EM | Admit: 2017-03-05 | Discharge: 2017-03-05 | Disposition: A | Discharge status: Home / Self Care | Payer: Self-pay | Attending: Emergency Medicine | Admitting: Emergency Medicine

## 2017-03-05 ENCOUNTER — Other Ambulatory Visit: Payer: Self-pay

## 2017-03-05 ENCOUNTER — Emergency Department: Payer: Self-pay

## 2017-03-05 ENCOUNTER — Encounter: Payer: Self-pay | Admitting: Emergency Medicine

## 2017-03-05 DIAGNOSIS — R51 Headache: Secondary | ICD-10-CM | POA: Insufficient documentation

## 2017-03-05 DIAGNOSIS — Z955 Presence of coronary angioplasty implant and graft: Secondary | ICD-10-CM | POA: Insufficient documentation

## 2017-03-05 DIAGNOSIS — E1165 Type 2 diabetes mellitus with hyperglycemia: Secondary | ICD-10-CM | POA: Insufficient documentation

## 2017-03-05 DIAGNOSIS — I2510 Atherosclerotic heart disease of native coronary artery without angina pectoris: Secondary | ICD-10-CM | POA: Insufficient documentation

## 2017-03-05 DIAGNOSIS — I10 Essential (primary) hypertension: Secondary | ICD-10-CM | POA: Insufficient documentation

## 2017-03-05 DIAGNOSIS — F17210 Nicotine dependence, cigarettes, uncomplicated: Secondary | ICD-10-CM | POA: Insufficient documentation

## 2017-03-05 DIAGNOSIS — R519 Headache, unspecified: Secondary | ICD-10-CM

## 2017-03-05 DIAGNOSIS — Z7984 Long term (current) use of oral hypoglycemic drugs: Secondary | ICD-10-CM | POA: Insufficient documentation

## 2017-03-05 HISTORY — DX: Cerebral infarction, unspecified: I63.9

## 2017-03-05 LAB — COMPREHENSIVE METABOLIC PANEL
ALT: 20 U/L (ref 17–63)
AST: 17 U/L (ref 15–41)
Albumin: 4.4 g/dL (ref 3.5–5.0)
Alkaline Phosphatase: 77 U/L (ref 38–126)
Anion gap: 10 (ref 5–15)
BUN: 21 mg/dL — AB (ref 6–20)
CHLORIDE: 103 mmol/L (ref 101–111)
CO2: 22 mmol/L (ref 22–32)
Calcium: 9.6 mg/dL (ref 8.9–10.3)
Creatinine, Ser: 0.98 mg/dL (ref 0.61–1.24)
Glucose, Bld: 126 mg/dL — ABNORMAL HIGH (ref 65–99)
POTASSIUM: 3.9 mmol/L (ref 3.5–5.1)
SODIUM: 135 mmol/L (ref 135–145)
Total Bilirubin: 0.9 mg/dL (ref 0.3–1.2)
Total Protein: 7.9 g/dL (ref 6.5–8.1)

## 2017-03-05 LAB — TROPONIN I
TROPONIN I: 0.03 ng/mL — AB (ref <0.03)
Troponin I: 0.04 ng/mL (ref <0.03)

## 2017-03-05 LAB — CBC WITH DIFFERENTIAL/PLATELET
BASOS ABS: 0.1 10*3/uL (ref 0–0.1)
Basophils Relative: 1 %
EOS ABS: 0.1 10*3/uL (ref 0–0.7)
Eosinophils Relative: 1 %
HCT: 52.9 % — ABNORMAL HIGH (ref 40.0–52.0)
Hemoglobin: 18.3 g/dL — ABNORMAL HIGH (ref 13.0–18.0)
LYMPHS PCT: 15 %
Lymphs Abs: 2.5 10*3/uL (ref 1.0–3.6)
MCH: 29.8 pg (ref 26.0–34.0)
MCHC: 34.5 g/dL (ref 32.0–36.0)
MCV: 86.5 fL (ref 80.0–100.0)
MONO ABS: 0.8 10*3/uL (ref 0.2–1.0)
Monocytes Relative: 5 %
Neutro Abs: 12.6 10*3/uL — ABNORMAL HIGH (ref 1.4–6.5)
Neutrophils Relative %: 78 %
Platelets: 377 10*3/uL (ref 150–440)
RBC: 6.12 MIL/uL — ABNORMAL HIGH (ref 4.40–5.90)
RDW: 13.9 % (ref 11.5–14.5)
WBC: 16.1 10*3/uL — AB (ref 3.8–10.6)

## 2017-03-05 MED ORDER — IOPAMIDOL (ISOVUE-370) INJECTION 76%
75.0000 mL | Freq: Once | INTRAVENOUS | Status: AC | PRN
  Administered 2017-03-05: 75 mL via INTRAVENOUS

## 2017-03-05 MED ORDER — ACETAMINOPHEN 500 MG PO TABS
1000.0000 mg | ORAL_TABLET | Freq: Once | ORAL | Status: AC
  Administered 2017-03-05: 1000 mg via ORAL
  Filled 2017-03-05: qty 2

## 2017-03-05 NOTE — ED Notes (Signed)
Pt cleared by MD to eat pt provided sandwich tray by RN

## 2017-03-05 NOTE — ED Triage Notes (Signed)
Pt was here 2 weeks ago for a stroke, and [facility]. Pts wife reports that she got home today and he was having neck and shoulder pain so severe that he could not move. She reports that they did not give him anything for pain and the tylenol is not helping.

## 2017-03-05 NOTE — ED Provider Notes (Signed)
[facility] Emergency Department Provider Note   ____________________________________________   First MD Initiated Contact with Patient 03/05/17 1715     (approximate)  I have reviewed the triage vital signs and the nursing notes.   HISTORY  Chief Complaint Headache   HPI Kenneth [name] is a 52 y.o. male Patient with recent stroke. Developed gradual onset of a severe headache in the back of his neck and both shoulders. His wife says she was told that the artery in the back of the head was "dissolving" at one point during his stay at [facility] for the recent stroke. Patient himself says he's had headache like this prior to the stroke.   Past Medical History:  Diagnosis Date  . Coronary artery disease   . Diabetes mellitus without complication (HCC)   . Hypertension   . Stroke (cerebrum) [facility])     Patient Active Problem List   Diagnosis Date Noted  . Encounter for central line placement   . Obstructive hydrocephalus   . Vertebral artery dissection (HCC)   . Ischemic stroke (HCC) 02/20/2017  . Type 2 diabetes mellitus with hyperlipidemia (HCC)   . Uncontrolled type 2 diabetes mellitus with hyperglycemia (HCC)   . Tobacco dependence   . Stroke (cerebrum) (HCC) 12/30/2015  . TIA (transient ischemic attack) 12/29/2015  . Hypertension 06/30/2015  . Arteriosclerosis of coronary artery 07/28/2014  . Type 2 diabetes mellitus with hyperglycemia (HCC) 07/28/2014  . Esophageal reflux 07/28/2014  . HLD (hyperlipidemia) 09/10/2006    Past Surgical History:  Procedure Laterality Date  . CORONARY ANGIOPLASTY WITH STENT PLACEMENT  06/18/2008  . CORONARY ANGIOPLASTY WITH STENT PLACEMENT      Prior to Admission medications   Medication Sig Start Date End Date Taking? Authorizing Provider  aspirin EC 325 MG EC tablet Take 1 tablet (325 mg total) by mouth daily. 02/28/17  Yes Narda Bonds, MD  atorvastatin (LIPITOR) 80 MG tablet Take 1 tablet (80 mg total)  by mouth daily at 6 PM. 02/27/17  Yes Narda Bonds, MD  clopidogrel (PLAVIX) 75 MG tablet Take 1 tablet (75 mg total) by mouth daily. 02/28/17  Yes Narda Bonds, MD  lisinopril (PRINIVIL,ZESTRIL) 40 MG tablet Take 1 tablet (40 mg total) by mouth daily. 02/27/17  Yes Narda Bonds, MD  metFORMIN (GLUCOPHAGE) 1000 MG tablet Take 1,000 mg by mouth 2 (two) times daily with a meal.   Yes [provider]  buPROPion (WELLBUTRIN SR) 100 MG 12 hr tablet Take 100 mg by mouth 2 (two) times daily.  06/29/15 06/28/16  [provider]  glimepiride (AMARYL) 4 MG tablet Take 1 tablet (4 mg total) by mouth daily with breakfast. Patient not taking: Reported on 02/20/2017 12/31/15   Ramonita Lab, MD  nicotine (NICODERM CQ - DOSED IN MG/24 HOURS) 21 mg/24hr patch Place 1 patch (21 mg total) onto the skin daily. Patient not taking: Reported on 02/20/2017 12/31/15   Ramonita Lab, MD  polyethylene glycol (MIRALAX / GLYCOLAX) packet Take 17 g by mouth 2 (two) times daily. Patient not taking: Reported on 03/05/2017 02/27/17   Narda Bonds, MD    Allergies Patient has no known allergies.  Family History  Problem Relation Age of Onset  . Heart attack Mother   . Heart failure Mother   . Coronary artery disease Father   . Diabetes Father     Social History Social History   Tobacco Use  . Smoking status: Heavy Tobacco Smoker  Packs/day: 2.00    Types: Cigarettes  . Smokeless tobacco: Never Used  Substance Use Topics  . Alcohol use: No    Alcohol/week: 0.0 oz    Comment: OCCASIONALLY  . Drug use: No    Review of Systems  Constitutional: No fever/chills Eyes: No visual changes. ENT: No sore throat. Cardiovascular: Denies chest pain. Respiratory: Denies shortness of breath. Gastrointestinal: No abdominal pain.  No nausea, no vomiting.  No diarrhea.  No constipation. Genitourinary: Negative for dysuria. Musculoskeletal: Negative for back pain. Skin: Negative for rash. Neurological:  Negative for new focal weakness   ____________________________________________   PHYSICAL EXAM:  VITAL SIGNS: ED Triage Vitals  Enc Vitals Group     BP 03/05/17 1531 (!) 133/91     Pulse Rate 03/05/17 1531 89     Resp 03/05/17 1531 16     Temp 03/05/17 1531 97.8 F (36.6 C)     Temp Source 03/05/17 1531 Oral     SpO2 03/05/17 1531 96 %     Weight 03/05/17 1533 168 lb (76.2 kg)     Height 03/05/17 1533 5\' 10"  (1.778 m)     Head Circumference --      Peak Flow --      Pain Score 03/05/17 1532 5     Pain Loc --      Pain Edu? --      Excl. in GC? --     Constitutional: Alert and oriented. Well appearing and in no acute distress. Eyes: Conjunctivae are normal. PERRL. EOMI. Head: Atraumatic. Nose: No congestion/rhinnorhea. Mouth/Throat: Mucous membranes are moist.  Oropharynx non-erythematous. Neck: No stridor.   cardiac: Grossly normal heart sounds.  Good peripheral circulation. Respiratory: Normal respiratory effort.  No retractions. Lungs CTAB. Gastrointestinal: Soft and nontender. No distention. No abdominal bruits. No CVA tenderness. Musculoskeletal: No lower extremity tenderness nor edema.  No joint effusions. Neurologic:  Normal speech and language. No new  gross focal neurologic deficits are appreciated. [place] cranial nerves II through XII are intact, visual fields owere not checked. Finger to nose in the hands is normal motor strength is 5 over 5 throughout. Patient reports old numbness and tingling in the left side after stroke 2 years ago. Skin:  Skin is warm, dry and intact. No rash noted. Psychiatric: Mood and affect are normal. Speech and behavior are normal.  ____________________________________________   LABS (all labs ordered are listed, but only abnormal results are displayed)  Labs Reviewed  CBC WITH DIFFERENTIAL/PLATELET - Abnormal; Notable for the following components:      Result Value   WBC 16.1 (*)    RBC 6.12 (*)    Hemoglobin 18.3 (*)    HCT  52.9 (*)    Neutro Abs 12.6 (*)    All other components within normal limits  COMPREHENSIVE METABOLIC PANEL - Abnormal; Notable for the following components:   Glucose, Bld 126 (*)    BUN 21 (*)    All other components within normal limits  TROPONIN I - Abnormal; Notable for the following components:   Troponin I 0.04 (*)    All other components within normal limits  TROPONIN I - Abnormal; Notable for the following components:   Troponin I 0.03 (*)    All other components within normal limits   ____________________________________________  EKG   ____________________________________________  RADIOLOGY  ED MD interpretation:  CT shows no change from prior CT nothing to explain his headache  Official radiology report(s): Ct Angio Head W Or Wo Contrast  Result Date: 03/05/2017 CLINICAL DATA:  52 year old male diagnosed with right vertebral artery dissection and right PICA territory infarct on 02/19/2017 by CTA and brain MRI. Progressive, severe neck and shoulder pain today. EXAM: CT ANGIOGRAPHY HEAD AND NECK TECHNIQUE: Multidetector CT imaging of the head and neck was performed using the standard protocol during bolus administration of intravenous contrast. Multiplanar CT image reconstructions and MIPs were obtained to evaluate the vascular anatomy. Carotid stenosis measurements (when applicable) are obtained utilizing NASCET criteria, using the distal internal carotid diameter as the denominator. CONTRAST:  75mL ISOVUE-370 IOPAMIDOL (ISOVUE-370) INJECTION 76% COMPARISON:  Head CT 02/25/2017 and earlier. Brain MRI and head and neck CTA 02/19/2017. FINDINGS: CT HEAD Brain: Heterogeneous hypo- and hyperdensity in the right cerebellar hemisphere compatible with evolved right PICA infarct with petechial appearing hemorrhage (e.g. Series 3, image 9). Since 02/25/2017 the cerebellar edema has regressed, and the posterior fossa mass effect has resolved. Normal 4th ventricle and basilar cisterns now.  Mild lateral and 3rd ventriculomegaly also has resolved since 02/25/2017. No intracranial mass effect. No other intracranial hemorrhage identified. Stable gray-white matter differentiation outside of the right cerebellum. Chronic appearing lacunar infarct of the posterior right corona radiata tracking toward the deep gray nuclei. Calvarium and skull base: Stable. No acute osseous abnormality identified. Paranasal sinuses: Visualized paranasal sinuses and mastoids are stable and well pneumatized. Orbits: Leftward gaze deviation, otherwise negative orbits soft tissues. No acute scalp soft tissue findings. CTA NECK Skeleton: Straightening of cervical lordosis compared to 02/19/2017. No acute osseous abnormality identified. Chronic moderate to severe right cervical facet degeneration at C2-C3 is stable. Upper chest: Minimal retained secretions along the right wall of the trachea below the larynx. Stable and negative visible upper chest. Other neck: Neck soft tissue spaces appears stable since January and within normal limits. No cervical lymphadenopathy. Aortic arch: No significant arch atherosclerosis. Three vessel arch configuration. Right carotid system: Stable and negative. Left carotid system: Stable and negative left CCA. Minimal soft and calcified plaque at the posterior left ICA origin is stable without stenosis. Otherwise negative cervical left ICA. Vertebral arteries: The proximal left subclavian artery and left vertebral artery are stable and within normal limits. The left vertebral artery appears somewhat non dominant, but is patent to the skull base without stenosis. The proximal right vertebral artery remains normal. Abnormal appearance of the right vertebral artery throughout the neck is largely unchanged since the 02/19/2017 CTA. Un abrupt triangular shaped termination of right vertebral artery and Hance Min is demonstrated just beyond the origin, with scant and irregular reconstituted enhancement  throughout the course of the right vertebral artery to the skull base. In the right V3 vertebral artery segment today a longitudinal intimal flap is evident (series 13, image 112). The right PICA origin is patent today, and arises early at the right C1 level. CTA HEAD Posterior circulation: Improved and nearly normalized enhancement of the right vertebral artery V4 segment since 02/19/2017. Improved right PICA patency and enhancement also noted as stated above. Stable distal left vertebral artery which is diminutive beyond the normal left PICA origin. Patent vertebrobasilar junction. Patent basilar artery without stenosis. SCA and PCA origins are within normal limits. Both posterior communicating arteries are present. Bilateral PCA branches are stable and within normal limits. Anterior circulation: Both ICA siphons remain patent and are stable, with only mild distal cavernous segment calcified plaque on the right. Normal ophthalmic and posterior communicating artery origins. Patent carotid termini. MCA and ACA origins remain normal. Anterior communicating artery and bilateral  ACA branches remain normal. Left MCA M1 segment and left MCA branches appear stable and normal. Right MCA M1 segment, bifurcation, and right MCA branches appear stable and normal. Venous sinuses: Best demonstrated on the delayed images, and appear to be patent throughout. Anatomic variants: Dominant right and non dominant left vertebral arteries. Delayed phase: Post ischemic appearing curvilinear and folia enhancement in the right PICA territory (series 17, image 3). No other abnormal intracranial enhancement. Review of the MIP images confirms the above findings IMPRESSION: 1. Improved appearance of the Right PICA territory infarct since 02/25/2017. Largely resolved cerebellar edema, resolved posterior fossa mass effect and mild ventriculomegaly. Petechial hemorrhage within the infarct is now evident, but there is no malignant hemorrhagic  transformation. 2. Right Vertebral Artery dissection persists. Although there is no improvement in the riGht Vertebral Artery V1 and V2 segment flow/enhancement, enhancement of the distal right vertebral V3 and V4 segments has improved, and the right PICA today is patent. 3. Stable CTA head and neck otherwise. Minimal carotid atherosclerosis. 4. No new intracranial abnormality. Electronically Signed   By: Odessa Fleming M.D.   On: 03/05/2017 19:33   Dg Chest 2 View  Result Date: 03/05/2017 CLINICAL DATA:  Upper back and shoulder pain today. Recent stroke x2 weeks ago. Hx of CAD, diabetes, HTN. Smoker x2ppd. EXAM: CHEST  2 VIEW COMPARISON:  02/22/2017 FINDINGS: Midline trachea. Normal heart size and mediastinal contours. No pleural effusion or pneumothorax. Clear lungs. IMPRESSION: No active cardiopulmonary disease. Electronically Signed   By: Jeronimo Greaves M.D.   On: 03/05/2017 18:54   Ct Angio Neck W And/or Wo Contrast  Result Date: 03/05/2017 CLINICAL DATA:  52 year old male diagnosed with right vertebral artery dissection and right PICA territory infarct on 02/19/2017 by CTA and brain MRI. Progressive, severe neck and shoulder pain today. EXAM: CT ANGIOGRAPHY HEAD AND NECK TECHNIQUE: Multidetector CT imaging of the head and neck was performed using the standard protocol during bolus administration of intravenous contrast. Multiplanar CT image reconstructions and MIPs were obtained to evaluate the vascular anatomy. Carotid stenosis measurements (when applicable) are obtained utilizing NASCET criteria, using the distal internal carotid diameter as the denominator. CONTRAST:  75mL ISOVUE-370 IOPAMIDOL (ISOVUE-370) INJECTION 76% COMPARISON:  Head CT 02/25/2017 and earlier. Brain MRI and head and neck CTA 02/19/2017. FINDINGS: CT HEAD Brain: Heterogeneous hypo- and hyperdensity in the right cerebellar hemisphere compatible with evolved right PICA infarct with petechial appearing hemorrhage (e.g. Series 3, image 9).  Since 02/25/2017 the cerebellar edema has regressed, and the posterior fossa mass effect has resolved. Normal 4th ventricle and basilar cisterns now. Mild lateral and 3rd ventriculomegaly also has resolved since 02/25/2017. No intracranial mass effect. No other intracranial hemorrhage identified. Stable gray-white matter differentiation outside of the right cerebellum. Chronic appearing lacunar infarct of the posterior right corona radiata tracking toward the deep gray nuclei. Calvarium and skull base: Stable. No acute osseous abnormality identified. Paranasal sinuses: Visualized paranasal sinuses and mastoids are stable and well pneumatized. Orbits: Leftward gaze deviation, otherwise negative orbits soft tissues. No acute scalp soft tissue findings. CTA NECK Skeleton: Straightening of cervical lordosis compared to 02/19/2017. No acute osseous abnormality identified. Chronic moderate to severe right cervical facet degeneration at C2-C3 is stable. Upper chest: Minimal retained secretions along the right wall of the trachea below the larynx. Stable and negative visible upper chest. Other neck: Neck soft tissue spaces appears stable since January and within normal limits. No cervical lymphadenopathy. Aortic arch: No significant arch atherosclerosis. Three vessel arch configuration.  Right carotid system: Stable and negative. Left carotid system: Stable and negative left CCA. Minimal soft and calcified plaque at the posterior left ICA origin is stable without stenosis. Otherwise negative cervical left ICA. Vertebral arteries: The proximal left subclavian artery and left vertebral artery are stable and within normal limits. The left vertebral artery appears somewhat non dominant, but is patent to the skull base without stenosis. The proximal right vertebral artery remains normal. Abnormal appearance of the right vertebral artery throughout the neck is largely unchanged since the 02/19/2017 CTA. Un abrupt triangular shaped  termination of right vertebral artery and Hance Min is demonstrated just beyond the origin, with scant and irregular reconstituted enhancement throughout the course of the right vertebral artery to the skull base. In the right V3 vertebral artery segment today a longitudinal intimal flap is evident (series 13, image 112). The right PICA origin is patent today, and arises early at the right C1 level. CTA HEAD Posterior circulation: Improved and nearly normalized enhancement of the right vertebral artery V4 segment since 02/19/2017. Improved right PICA patency and enhancement also noted as stated above. Stable distal left vertebral artery which is diminutive beyond the normal left PICA origin. Patent vertebrobasilar junction. Patent basilar artery without stenosis. SCA and PCA origins are within normal limits. Both posterior communicating arteries are present. Bilateral PCA branches are stable and within normal limits. Anterior circulation: Both ICA siphons remain patent and are stable, with only mild distal cavernous segment calcified plaque on the right. Normal ophthalmic and posterior communicating artery origins. Patent carotid termini. MCA and ACA origins remain normal. Anterior communicating artery and bilateral ACA branches remain normal. Left MCA M1 segment and left MCA branches appear stable and normal. Right MCA M1 segment, bifurcation, and right MCA branches appear stable and normal. Venous sinuses: Best demonstrated on the delayed images, and appear to be patent throughout. Anatomic variants: Dominant right and non dominant left vertebral arteries. Delayed phase: Post ischemic appearing curvilinear and folia enhancement in the right PICA territory (series 17, image 3). No other abnormal intracranial enhancement. Review of the MIP images confirms the above findings IMPRESSION: 1. Improved appearance of the Right PICA territory infarct since 02/25/2017. Largely resolved cerebellar edema, resolved posterior  fossa mass effect and mild ventriculomegaly. Petechial hemorrhage within the infarct is now evident, but there is no malignant hemorrhagic transformation. 2. Right Vertebral Artery dissection persists. Although there is no improvement in the riGht Vertebral Artery V1 and V2 segment flow/enhancement, enhancement of the distal right vertebral V3 and V4 segments has improved, and the right PICA today is patent. 3. Stable CTA head and neck otherwise. Minimal carotid atherosclerosis. 4. No new intracranial abnormality. Electronically Signed   By: Odessa Fleming M.D.   On: 03/05/2017 19:33    ____________________________________________   PROCEDURES  Procedure(s) performed:   Procedures  Critical Care performed:   ____________________________________________   INITIAL IMPRESSION / ASSESSMENT AND PLAN / ED COURSE   his  troponin is elevated but review of the old records show it has always been elevated.CT shows no acute changes patient's headache is essentially gone at this point I will discharge him with follow-up with his neurologist     Clinical Course as of Mar 06 25  Tue Mar 05, 2017  1716 HCT: Marland Kitchen 52.9 [PM]    Clinical Course User Index [PM] Arnaldo Natal, MD     ____________________________________________   FINAL CLINICAL IMPRESSION(S) / ED DIAGNOSES  Final diagnoses:  Nonintractable episodic headache, unspecified headache type  ED Discharge Orders    None       Note:  This document was prepared using Dragon voice recognition software and may include unintentional dictation errors.    Arnaldo Natal, MD 03/06/17 Jacinta Shoe

## 2017-03-05 NOTE — Discharge Instructions (Signed)
please return for severe headache. Follow-up with your neurologist to see what we can do about making sure these do not keep coming back.

## 2017-03-05 NOTE — Research (Signed)
STROKE AF Informed Consent   Subject Name: Kenneth [name]  Subject met inclusion and exclusion criteria.  The informed consent form, study requirements and expectations were reviewed with the subject and questions and concerns were addressed prior to the signing of the consent form.  The subject verbalized understanding of the trail requirements.  The subject agreed to participate in the STROKE AF trial and signed the informed consent.  The informed consent was obtained prior to performance of any protocol-specific procedures for the subject.  A copy of the signed informed consent was given to the subject and a copy was placed in the subject's medical record.  LUTTERLOH, KIMBERLY D 02/27/2017, 0900AM  

## 2017-03-05 NOTE — ED Notes (Signed)
FIRST NURSE NOTE: pt comes into the ED via EMS from home with c/o upper back pain, states he has a hx of back pain. Pt is in NAD on arrival. Respirations WNL, skin is warm and dry..Marland Kitchen

## 2017-03-11 ENCOUNTER — Telehealth: Payer: Self-pay | Admitting: Neurology

## 2017-03-11 ENCOUNTER — Ambulatory Visit: Payer: Self-pay | Admitting: Neurology

## 2017-03-11 ENCOUNTER — Ambulatory Visit (INDEPENDENT_AMBULATORY_CARE_PROVIDER_SITE_OTHER): Payer: Self-pay | Admitting: Family Medicine

## 2017-03-11 ENCOUNTER — Encounter: Payer: Self-pay | Admitting: Family Medicine

## 2017-03-11 ENCOUNTER — Encounter: Payer: Self-pay | Admitting: Neurology

## 2017-03-11 VITALS — BP 140/65 | HR 80 | Ht 70.0 in | Wt 160.8 lb

## 2017-03-11 VITALS — BP 152/102 | HR 70 | Temp 98.0°F | Wt 163.2 lb

## 2017-03-11 DIAGNOSIS — F17200 Nicotine dependence, unspecified, uncomplicated: Secondary | ICD-10-CM

## 2017-03-11 DIAGNOSIS — I63211 Cerebral infarction due to unspecified occlusion or stenosis of right vertebral arteries: Secondary | ICD-10-CM

## 2017-03-11 DIAGNOSIS — M25511 Pain in right shoulder: Secondary | ICD-10-CM

## 2017-03-11 DIAGNOSIS — E785 Hyperlipidemia, unspecified: Secondary | ICD-10-CM

## 2017-03-11 DIAGNOSIS — M25512 Pain in left shoulder: Secondary | ICD-10-CM

## 2017-03-11 DIAGNOSIS — G911 Obstructive hydrocephalus: Secondary | ICD-10-CM

## 2017-03-11 DIAGNOSIS — M542 Cervicalgia: Secondary | ICD-10-CM

## 2017-03-11 DIAGNOSIS — I7774 Dissection of vertebral artery: Secondary | ICD-10-CM

## 2017-03-11 MED ORDER — OXYCODONE-ACETAMINOPHEN 10-325 MG PO TABS: 1.0000 | ORAL_TABLET | Freq: Four times a day (QID) | ORAL | 0 refills | Status: DC | PRN

## 2017-03-11 MED ORDER — METHYLPREDNISOLONE 4 MG PO TBPK: ORAL_TABLET | ORAL | 0 refills | Status: DC

## 2017-03-11 NOTE — Telephone Encounter (Signed)
Patient states he will callback to schedule 4 week follow-up.

## 2017-03-11 NOTE — Patient Instructions (Addendum)
-   will continue ASA and plavix and lipitor for stroke prevention - your shoulder pain likely due to b/l bursitis. Needs orthopedics appointment. Please contact your orthopedics in [place] ASAP to be seen.  - will give oxycodone and medrol dose pack for pain relieve temporarily  - check blood tests to rule out other conditions. - Follow up with your primary care physician for stroke risk factor modification. Recommend maintain blood pressure goal <130/80, diabetes with hemoglobin A1c goal below 7.0% and lipids with LDL cholesterol goal below 70 mg/dL.  - follow up as scheduled already on 04/10/17.

## 2017-03-11 NOTE — Progress Notes (Signed)
Patient: Kenneth CluckJimmy R Kasparian Male    DOB: 11/12/1965   52 y.o.   MRN: [id number] Visit Date: 03/11/2017  Today's Provider: Dortha Kernennis Chrismon, PA   Chief Complaint  Patient presents with  . Shoulder Pain   Subjective:    Shoulder Pain   The pain is present in the left shoulder and right shoulder. This is a new problem. Episode onset: 02/19/17. The problem occurs constantly. The problem has been gradually worsening. The pain is at a severity of 7/10. Associated symptoms include tingling (left side). Limited range of motion: left arm. CVA   Past Medical History:  Diagnosis Date  . Coronary artery disease   . Diabetes mellitus without complication (HCC)   . Hypertension   . Stroke (cerebrum) [facility](HCC)    Past Surgical History:  Procedure Laterality Date  . CORONARY ANGIOPLASTY WITH STENT PLACEMENT  06/18/2008  . CORONARY ANGIOPLASTY WITH STENT PLACEMENT     Family History  Problem Relation Age of Onset  . Heart attack Mother   . Heart failure Mother   . Coronary artery disease Father   . Diabetes Father    No Known Allergies  Current Outpatient Medications:  .  aspirin EC 325 MG EC tablet, Take 1 tablet (325 mg total) by mouth daily., Disp: 30 tablet, Rfl: 0 .  atorvastatin (LIPITOR) 80 MG tablet, Take 1 tablet (80 mg total) by mouth daily at 6 PM., Disp: 30 tablet, Rfl: 0 .  clopidogrel (PLAVIX) 75 MG tablet, Take 1 tablet (75 mg total) by mouth daily., Disp: 30 tablet, Rfl: 0 .  lisinopril (PRINIVIL,ZESTRIL) 40 MG tablet, Take 1 tablet (40 mg total) by mouth daily., Disp: 30 tablet, Rfl: 0 .  metFORMIN (GLUCOPHAGE) 1000 MG tablet, Take 1,000 mg by mouth 2 (two) times daily with a meal., Disp: , Rfl:  .  buPROPion (WELLBUTRIN SR) 100 MG 12 hr tablet, Take 100 mg by mouth 2 (two) times daily. , Disp: , Rfl:  .  glimepiride (AMARYL) 4 MG tablet, Take 1 tablet (4 mg total) by mouth daily with breakfast. (Patient not taking: Reported on 02/20/2017), Disp: 30 tablet, Rfl: 0 .  nicotine  (NICODERM CQ - DOSED IN MG/24 HOURS) 21 mg/24hr patch, Place 1 patch (21 mg total) onto the skin daily. (Patient not taking: Reported on 02/20/2017), Disp: 28 patch, Rfl: 0  Review of Systems  Constitutional: Negative.   Respiratory: Negative.   Cardiovascular: Negative.   Neurological: Positive for tingling (left side).   Social History   Tobacco Use  . Smoking status: Heavy Tobacco Smoker    Packs/day: 2.00    Types: Cigarettes  . Smokeless tobacco: Never Used  Substance Use Topics  . Alcohol use: No    Alcohol/week: 0.0 oz    Comment: OCCASIONALLY   Objective:   BP (!) 152/102 (BP Location: Right Arm, Cuff Size: Normal)   Pulse 70   Temp 98 F (36.7 C) (Oral)   Wt 163 lb 3.2 oz (74 kg)   SpO2 95%   BMI 23.42 kg/m   Physical Exam  Constitutional: He is oriented to person, place, and time. He appears well-developed and well-nourished.  HENT:  Head: Normocephalic.  Nose: Nose normal.  Questionable redness of both TM's and posterior pharynx without exudates or symptoms.  Eyes: Conjunctivae and EOM are normal.  Neck: Neck supple. No JVD present.  Cardiovascular: Normal rate and regular rhythm.  Pulmonary/Chest: Effort normal and breath sounds normal.  Abdominal: Soft. Bowel sounds are normal.  Musculoskeletal: Normal range of motion. He exhibits tenderness.  Right posterior neck to palpation. Questionable nuchal stiffness.  Lymphadenopathy:    He has no cervical adenopathy.  Neurological: He is alert and oriented to person, place, and time. He has normal reflexes.  Skin: No rash noted.  Psychiatric: His speech is normal. Thought content normal. His affect is angry. He is agitated. He expresses impulsivity.      Assessment & Plan:     1. Acute neck pain Has had a headache since his last ER visit on 03-05-17. Feels the posterior neck pain is radiating into bilateral upper shoulders. CT scans of head and neck on 03-05-17 showed dissection of the right vertebral artery and  improvement in the right PICA territory infarct with resolved cerebellar edema and petechial hemorrhage within the infarct since CVA on 02-25-17. Walked in to the office unassisted. No unilateral weakness. Recommend he go to [facility] ER to rule out worsening dissection of vertebral artery on the right or post stroke intracranial hemorrhage. He and his wife had made a call to his neurologist and wants to wait for the call back to see if the Neurologist could see him this afternoon.   2. 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 Patient states this is his normal despite use of Lisinopril 40 mg qd. Was diagnosed with CVA and pain at the right base of his skull where a vertebral dissecting artery was found on scans. Still taking Plavix 75 mg qd with ASA 325 mg qd. Having severe headache with neck and bilateral shoulder pain. No significant neurological deficit noticed at the present. Advised to go to [facility] ER, but, wanted to wait for the call back from his neurologist since he had called them prior to coming to the office.       Dortha Kern, PA  [facility]

## 2017-03-11 NOTE — Progress Notes (Signed)
STROKE NEUROLOGY FOLLOW UP NOTE  NAME: Kenneth [name] DOB: Dec 25, 1965  REASON FOR VISIT: stroke follow up HISTORY FROM: Patient, wife and chart  Today we had the pleasure of seeing Kenneth [name] in follow-up at our Neurology Clinic. Pt was accompanied by wife.   History Summary 52 year old male with history of diabetes, hypertension, smoker had a stroke in 12/2015 with left upper extremity numbness, gait imbalance and left-sided coordination difficulty. MRI showed right BG/CR infarct. Carotid Doppler negative. EF 30-35%. He has been following with cardiology in [place] [facility]. TG 2631, A1c 9.5. He is not compliant with medication at home.  He was admitted on 02/20/17 for dizziness, nausea and vomiting. He admitted on and off neck pain for the last 1 week. CT head no hemorrhage. 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 MRI showed right cerebellum infarct and remote right CR infarct. EF 35-40%, not able to rule out LV thrombosis. Had 2D echo with contrast showed EF 45-50% and no mural thrombus. LDL 63, TG 253 and A1c 7.7. His TG much improved from 1 year ago. On 02/21/17 complained of severe headache with nausea vomiting. Repeat CT showed mild hydrocephalus. Put on 3% saline and transfer to ICU. NSG Dr. Arnoldo Morale contacted and no EVD needed at that time. Pt HA gradually improved and resolved. Repeat serial CT showed stable mild hydrocephalus.  Discharged with  aspirin, Plavix and Lipitor.    Interval History During the interval time, the patient initially doing well without headache.  However, on 03/05/17, he had ED visit for severe headache in the back of his neck and both shoulders.  Repeat CT head showed normal ventricle size, no acute abnormality. CTA head and neck no significant change with continued right VA proximal occlusion.  His headache and shoulder pain improved during ED visit, he was  discharged from ED without any specific treatment.  Over the last 1 week, he still has mild shoulder pain but bearable.  However, he woke up today with a severe bilateral shoulder pain, described as throbbing pain, difficulty with shoulder movement due to pain.  He went to see PCP today and referred here for further evaluation.  He denies any headache, dizziness, nausea vomiting, weakness, neck pain, bowel bladder dysfunction.  REVIEW OF SYSTEMS: Full 14 system review of systems performed and notable only for those listed below and in HPI above, all others are negative:  Constitutional:   Cardiovascular:  Ear/Nose/Throat:   Skin:  Eyes:   Respiratory:   Gastroitestinal:   Genitourinary:  Hematology/Lymphatic:   Endocrine:  Musculoskeletal: Aching muscles Allergy/Immunology:   Neurological:   Psychiatric:  Sleep:   The following represents the patient's updated allergies and side effects list: No Known Allergies  The neurologically relevant items on the patient's problem list were reviewed on today's visit.  Neurologic Examination  A problem focused neurological exam (12 or more points of the single system neurologic examination, vital signs counts as 1 point, cranial nerves count for 8 points) was performed.  Blood pressure 140/65, pulse 80, height '5\' 10"'  (1.778 m), weight 160 lb 12.8 oz (72.9 kg).  General - Well nourished, well developed, in mild distress.  Ophthalmologic - Fundi not visualized due to mild distress.  Cardiovascular - Regular rate and rhythm.  Mental Status -  Level of arousal and orientation to time, place, and person were intact.  However, mildly agitated and easily irritated.  Language including  expression, naming, repetition, comprehension was assessed and found intact. Fund of Knowledge was assessed and was intact.  Cranial Nerves II - XII - II - Visual field intact OU. III, IV, VI - Extraocular movements intact. V - Facial sensation intact  bilaterally. VII - Facial movement intact bilaterally. VIII - Hearing & vestibular intact bilaterally. X - Palate elevates symmetrically. XI - Chin turning & shoulder shrug intact bilaterally. XII - Tongue protrusion intact.  Motor Strength - The patient's strength was normal in all extremities except bilateral deltoid 3+/5 due to bilateral shoulder pain.  Tenderness on shoulder palpation bilaterally.  Bulk was normal and fasciculations were absent.   Motor Tone - Muscle tone was assessed at the neck and appendages and was normal.  Reflexes - The patient's reflexes were 1+ in all extremities and he had no pathological reflexes.  Sensory - Light touch, temperature/pinprick, vibration and proprioception, and Romberg testing were assessed and were normal.    Coordination - The patient had normal movements in the hands and feet with no ataxia or dysmetria.  Tremor was absent.  Gait and Station - The patient's transfers, posture, gait, station, and turns were observed as normal.   Functional score  mRS = 2   0 - No symptoms.   1 - No significant disability. Able to carry out all usual activities, despite some symptoms.   2 - Slight disability. Able to look after own affairs without assistance, but unable to carry out all previous activities.   3 - Moderate disability. Requires some help, but able to walk unassisted.   4 - Moderately severe disability. Unable to attend to own bodily needs without assistance, and unable to walk unassisted.   5 - Severe disability. Requires constant nursing care and attention, bedridden, incontinent.   6 - Dead.   NIH Stroke Scale   Level Of Consciousness 0=Alert; keenly responsive 1=Not alert, but arousable by minor stimulation 2=Not alert, requires repeated stimulation 3=Responds only with reflex movements 0  LOC Questions to Month and Age 5=Answers both questions correctly 1=Answers one question correctly 2=Answers neither question correctly  0  LOC Commands      -Open/Close eyes     -Open/close grip 0=Performs both tasks correctly 1=Performs one task correctly 2=Performs neighter task correctly 0  Best Gaze 0=Normal 1=Partial gaze palsy 2=Forced deviation, or total gaze paresis 0  Visual 0=No visual loss 1=Partial hemianopia 2=Complete hemianopia 3=Bilateral hemianopia (blind including cortical blindness) 0  Facial Palsy 0=Normal symmetrical movement 1=Minor paralysis (asymmetry) 2=Partial paralysis (lower face) 3=Complete paralysis (upper and lower face) 0  Motor  0=No drift, limb holds posture for full 10 seconds 1=Drift, limb holds posture, no drift to bed 2=Some antigravity effort, cannot maintain posture, drifts to bed 3=No effort against gravity, limb falls 4=No movement Right Arm 1     Leg 1    Left Arm 0     Leg 0  Limb Ataxia 0=Absent 1=Present in one limb 2=Present in two limbs 0  Sensory 0=Normal 1=Mild to moderate sensory loss 2=Severe to total sensory loss 0  Best Language 0=No aphasia, normal 1=Mild to moderate aphasia 2=Mute, global aphasia 3=Mute, global aphasia 0  Dysarthria 0=Normal 1=Mild to moderate 2=Severe, unintelligible or mute/anarthric 0  Extinction/Neglect 0=No abnormality 1=Extinction to bilateral simultaneous stimulation 2=Profound neglect 0  Total   2     Data reviewed: I personally reviewed the images and agree with the radiology interpretations.  [name] Contrast Result Date: 02/20/2017 IMPRESSION: 1.  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 The vessel appears to be reconstituted in the distal V2 segment. 2. Negative MRI appearance of the left vertebral artery and bilateral cervical carotid arteries. Electronically Signed   By: Genevie Ann M.D.   On: 02/20/2017 12:54   Mr Brain Wo Contrast  Result Date: 02/19/2017 IMPRESSION: 1. Acute right pica territory infarct with known  underlying right vertebral dissection. 2. No hydrocephalus. 3. Small remote right corona radiata infarct. Electronically Signed   By: Monte Fantasia M.D.   On: 02/19/2017 15:51   Ct Angio Head/ Neck W Or Wo Contrast Result Date: 02/19/2017  IMPRESSION: 1. The right vertebral artery is abnormal throughout its course. 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 2. Unfortunately the distal right vertebral artery is dominant. The left vertebral is normal but diminutive beyond the left PICA. Still, the vertebrobasilar junction, basilar artery, and other posterior circulation remain patent. 3. Stable CT appearance of the brain since 1156 hours today with no acute infarct evident. 4. Minimal carotid atherosclerosis, no carotid stenosis, and otherwise negative anterior circulation. Electronically Signed By: Genevie Ann M.D. On: 02/19/2017 13:51   Ct Head Wo Contrast Result Date: 02/19/2017 IMPRESSION: 1. No evidence of acute intracranial abnormality. 2. 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 Electronically Signed By: Logan Bores M.D. On: 02/19/2017 12:10   Echocardiogram:                                               Study Conclusions  - Left ventricle: The cavity size was normal. Systolic function was moderately reduced. The estimated ejection fraction was in the range of 35% to 40%. Images were inadequate for LV wall motion assessment and cannot visualize the apex adequately. Features are consistent with a pseudonormal left ventricular filling pattern, with concomitant abnormal relaxation and increased filling pressure (grade 2 diastolic dysfunction). Doppler parameters are consistent with high ventricular filling pressure. - Aortic valve: Trileaflet; normal thickness, mildly calcified leaflets. - Mitral valve: There was trivial regurgitation. - Left atrium: The  atrium was mildly dilated. - Pulmonary arteries: PA peak pressure: 34 mm Hg (S). - Pericardium, extracardiac: A trivial pericardial effusion was identified posterior to the heart. - Recommendations: Limited study with definity contrast to assess wall motion and rule out apical thrombus.  Echocardiogram Limited:       02-22-2017, 12:29:44            Study Conclusions - Technical notes: Definity to assess LV. - Left ventricle: Systolic function was mildly reduced. The estimated ejection fraction was in the range of 45% to 50%. Impressions:- LVEF 45-50% - no mural thrombus.  CT HEAD WITHOUT CONTRAST 02/21/2017 22:45 IMPRESSION: 1. Evolving RIGHT posterior-inferior cerebellar artery territory infarct with increasing edema and mass effect, no hemorrhagic conversion. 2. New mild obstructive hydrocephalus.  CT HEAD WITHOUT CONTRAST 02/22/2017 03:01 IMPRESSION: 1. Stable distribution of right PICA infarction. Stable associated mass effect with partial effacement of fourth ventricle and quadrigeminal plate cistern. Stable ventricle size with mild hydrocephalus. 2. No new acute intracranial abnormality identified.  Ct Head Wo Contrast 02/23/2017 IMPRESSION: 1. Unchanged right PICA infarct and associated posterior fossa mass effect. Unchanged mild hydrocephalus. 2. No new intracranial abnormality.   Component     Latest  Ref Rng & Units 02/19/2017 02/20/2017 02/21/2017  Cholesterol     0 - 200 mg/dL   141  Triglycerides     <150 mg/dL   253 (H)  HDL Cholesterol     >40 mg/dL   27 (L)  Total CHOL/HDL Ratio     RATIO   5.2  VLDL     0 - 40 mg/dL   51 (H)  LDL (calc)     0 - 99 mg/dL   63  Hemoglobin A1C     4.8 - 5.6 % 7.7 (H)    Mean Plasma Glucose     mg/dL 174.29    HIV Screen 4th Generation wRfx     Non Reactive  Non Reactive   TSH     0.350 - 4.500 uIU/mL  0.765     Assessment: As you may recall, he is a 52 y.o. Caucasian male with PMH of diabetes,  hypertension, HLD, smoker, prior right BG/CR stroke in 12/2017admitted on 02/20/17 for dizziness, nausea and vomiting. He admitted on and off neck pain for the last 1 week. CT head no hemorrhage. 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 MRI showed right cerebellum infarct and remote right CR infarct. EF 35-40%, not able to rule out LV thrombosis. Had 2D echo with contrast showed EF 45-50% and no mural thrombus. LDL 63, TG 253 and A1c 7.7. His TG much improved from 1 year ago. Developed mild hydrocephalus. Put on 3% saline and no EVD needed at that time. Pt HA gradually improved and resolved. Repeat serial CT showed stable mild hydrocephalus.  Discharged with  aspirin, Plavix and Lipitor.    On 03/05/17, he had ED visit for severe headache in the back of his neck and both shoulders.  Repeat CT head showed normal ventricle size, no acute abnormality. CTA head and neck no significant change with continued right VA proximal occlusion. He woke up today with a severe bilateral shoulder throbbing pain, difficulty with shoulder movement due to pain. Tenderness with shoulder palpation bilaterally. He denies any headache, dizziness, nausea vomiting, weakness, neck pain.  Symptoms concerning for bilateral bursitis versus  polymyalgia rheumatica. Will check ESR and CRP. Will give percocet and medrol dose pak for symptom relief.  Recommend orthopedics follow-up ASAP.  Plan:  - continue ASA and plavix and lipitor for stroke prevention - your shoulder pain likely due to b/l bursitis polymyalgia rheumaticavs. Needs orthopedics appointment ASAP. Please contact your orthopedics in [place] ASAP to be seen.  - will give oxycodone and medrol dose pack for pain relieve temporarily  - check ESR, CRP, CK, ANA to rule out other conditions. - Follow up with your primary care physician for stroke risk factor modification.  Recommend maintain blood pressure goal <130/80, diabetes with hemoglobin A1c goal below 7.0% and lipids with LDL cholesterol goal below 70 mg/dL.  - follow up as scheduled already on 04/10/17.   I spent more than 25 minutes of face to face time with the patient. Greater than 50% of time was spent in counseling and coordination of care. We discussed stroke prevention, etiology of shoulder pain, follow-up with orthopedics.   Orders Placed This Encounter  Procedures  . Sedimentation rate  . C-reactive protein  . ANA  . CK    Meds ordered this encounter  Medications  . oxyCODONE-acetaminophen (PERCOCET) 10-325 MG tablet    Sig: Take 1 tablet by mouth every 6 (  six) hours as needed for pain.    Dispense:  15 tablet    Refill:  0  . methylPREDNISolone (MEDROL DOSEPAK) 4 MG TBPK tablet    Sig: 6 tablets on day 1, then 5 tab day 2, 4 tab on day 3, 3 tab on day 4, 2 tab on day 5 and 1 tab at day 6    Dispense:  21 tablet    Refill:  0    Patient Instructions  - will continue ASA and plavix and lipitor for stroke prevention - your shoulder pain likely due to b/l bursitis. Needs orthopedics appointment. Please contact your orthopedics in [place] ASAP to be seen.  - will give oxycodone and medrol dose pack for pain relieve temporarily  - check blood tests to rule out other conditions. - Follow up with your primary care physician for stroke risk factor modification. Recommend maintain blood pressure goal <130/80, diabetes with hemoglobin A1c goal below 7.0% and lipids with LDL cholesterol goal below 70 mg/dL.  - follow up as scheduled already on 04/10/17.    Rosalin Hawking, MD PhD [facility] [address] [place], [place] [address] [phone]

## 2017-03-12 ENCOUNTER — Telehealth: Payer: Self-pay | Admitting: Neurology

## 2017-03-12 LAB — C-REACTIVE PROTEIN: CRP: 0.5 mg/L (ref 0.0–4.9)

## 2017-03-12 LAB — SEDIMENTATION RATE: SED RATE: 9 mm/h (ref 0–30)

## 2017-03-12 LAB — CK: CK TOTAL: 43 U/L (ref 24–204)

## 2017-03-12 LAB — ANA: Anti Nuclear Antibody(ANA): POSITIVE — AB

## 2017-03-12 NOTE — Telephone Encounter (Addendum)
Charles/[facility] [phone] called his system notified him the patient is opioid naive. He said oxyCODONE-acetaminophen (PERCOCET) 10-325 MG tablet reads every 6 hrs which would put him over 50 morphine equivalent. Please call to advise

## 2017-03-12 NOTE — Telephone Encounter (Signed)
Rn call Leonette Mostharles at [facility]. Dr Roda ShuttersXu was at [place] desk and spoke with Leonette Mostharles. Dr. Roda ShuttersXu spoke with Leonette Mostharles, and the order will be one percocet every 8 hours.

## 2017-03-12 NOTE — Telephone Encounter (Signed)
Rn call Rosanne AshingJim back at [facility] pharmacy.  There was not a Jim in the pharmacy. Rn was told it was Leonette Mostharles who call the office concerning the percocet dosage, and frequency. Leonette MostCharles stated there is no history of patient being on percocet in the narcotic website.[employer] Kitchen. He stated was the pt on medication in the hospital or had been prescribed in the past. Rn stated pt had stroke, but was not given perocet in the hospital. He stated the rx will have to be change to every 8 hours because of the dosage. Rn stated a call will be made back with Dr.Xu recommendations. Charles verbalized understanding.

## 2017-03-12 NOTE — Telephone Encounter (Signed)
Called pt today to see how he is doing. He stated that he did not get medrol dose pak or percocet yet which are on hold in pharmacy. But he got bilateral shoulder steroids shot today in orthopedics office. They did X-ray said he has bilateral bursitis and arthritis with right rotator cuff injury. Currently, he feels much better but still not normal yet. His wife is going to get him the medication I prescribed yesterday.   I told him that the blood tests we did yesterday showed ANA positive. I would like to see how he respond to orthopedic procedures. If he continues to have problem, we need to refer him to rheumatology for evaluation. He expressed understanding and appreciation.   Hi, Kathie RhodesBetty and Lurena JoinerRebecca, could you call the patient to make an appointment with me in one month? Thanks much.   Marvel PlanJindong Sheldon Sem, MD PhD Stroke Neurology 03/12/2017 5:19 PM

## 2017-03-13 ENCOUNTER — Other Ambulatory Visit: Payer: Self-pay

## 2017-03-13 ENCOUNTER — Encounter: Payer: Self-pay | Admitting: Emergency Medicine

## 2017-03-13 DIAGNOSIS — F17210 Nicotine dependence, cigarettes, uncomplicated: Secondary | ICD-10-CM | POA: Insufficient documentation

## 2017-03-13 DIAGNOSIS — E119 Type 2 diabetes mellitus without complications: Secondary | ICD-10-CM | POA: Insufficient documentation

## 2017-03-13 DIAGNOSIS — M25512 Pain in left shoulder: Secondary | ICD-10-CM | POA: Insufficient documentation

## 2017-03-13 DIAGNOSIS — Z7982 Long term (current) use of aspirin: Secondary | ICD-10-CM | POA: Insufficient documentation

## 2017-03-13 DIAGNOSIS — I10 Essential (primary) hypertension: Secondary | ICD-10-CM | POA: Insufficient documentation

## 2017-03-13 DIAGNOSIS — Z7984 Long term (current) use of oral hypoglycemic drugs: Secondary | ICD-10-CM | POA: Insufficient documentation

## 2017-03-13 DIAGNOSIS — Z8673 Personal history of transient ischemic attack (TIA), and cerebral infarction without residual deficits: Secondary | ICD-10-CM | POA: Insufficient documentation

## 2017-03-13 DIAGNOSIS — Z79899 Other long term (current) drug therapy: Secondary | ICD-10-CM | POA: Insufficient documentation

## 2017-03-13 DIAGNOSIS — I2510 Atherosclerotic heart disease of native coronary artery without angina pectoris: Secondary | ICD-10-CM | POA: Insufficient documentation

## 2017-03-13 NOTE — Telephone Encounter (Signed)
Rn spoke with wife about Dr. Roda ShuttersXu needs to pt in 4 weeks.The wife stated they have an appt on 04/10/2017 with Dr. Roda ShuttersXu. Rn explain when they requested to be seen sooner,that appt was cancel. The wife stated she was never told that when she schedule the earlier appt on 03/11/2017. The pt was schedule for first week in April 2019. The wife stated pt was given steroid shots and feels a lot better by the orthopedic MD. Rn stated pt still needs to take the medrol dose, and pain pills that Dr. Roda ShuttersXu prescribed. The wife verbalized understanding. RN made another appt for first week in April 2019.

## 2017-03-13 NOTE — ED Triage Notes (Addendum)
Pt to triage via w/c, brought in by EMS; pt st had steroid injection to shoulders bilat yesterday at ortho (dx with arthritis, bursitis and rotator cuff injury); increasing pan since this morning; TENS unit in place; pt seen for same recently

## 2017-03-14 ENCOUNTER — Emergency Department
Admission: EM | Admit: 2017-03-14 | Discharge: 2017-03-14 | Disposition: A | Discharge status: Home / Self Care | Payer: Self-pay | Attending: Emergency Medicine | Admitting: Emergency Medicine

## 2017-03-14 DIAGNOSIS — M25511 Pain in right shoulder: Secondary | ICD-10-CM

## 2017-03-14 DIAGNOSIS — M25512 Pain in left shoulder: Secondary | ICD-10-CM

## 2017-03-14 MED ORDER — DEXAMETHASONE SODIUM PHOSPHATE 10 MG/ML IJ SOLN
10.0000 mg | Freq: Once | INTRAMUSCULAR | Status: AC
  Administered 2017-03-14: 10 mg via INTRAVENOUS
  Filled 2017-03-14: qty 1

## 2017-03-14 MED ORDER — KETOROLAC TROMETHAMINE 30 MG/ML IJ SOLN
30.0000 mg | Freq: Once | INTRAMUSCULAR | Status: AC
  Administered 2017-03-14: 30 mg via INTRAVENOUS
  Filled 2017-03-14: qty 1

## 2017-03-14 MED ORDER — OXYCODONE-ACETAMINOPHEN 5-325 MG PO TABS: 1.0000 | ORAL_TABLET | ORAL | 0 refills | Status: DC | PRN

## 2017-03-14 NOTE — ED Provider Notes (Signed)
[facility] Emergency Department Provider Note    First MD Initiated Contact with Patient 03/14/17 0008     (approximate)  I have reviewed the triage vital signs and the nursing notes.   HISTORY  Chief Complaint Shoulder Pain    HPI LESTER CRICKENBERGER is a 52 y.o. male with below list of chronic medical conditions presents to the emergency department with bilateral shoulder pain which is currently 10 out of 10 following taking "3 OxyContin" before arrival to the emergency department.  Patient states OxyContin was prescribed for his father however secondary to his pain he decided that he would take to pain was unrelieved after doing so and so he took a third.  Patient states that he was seen by Dr. Martha Clan yesterday and a steroid shot placed in his shoulder secondary to rotator cuff injury as well as bursitis.  Patient states that pain is worsened with any movement of the shoulders.  Patient denies any fever   Past Medical History:  Diagnosis Date  . Coronary artery disease   . Diabetes mellitus without complication (HCC)   . Hypertension   . Stroke (cerebrum) [facility])     Patient Active Problem List   Diagnosis Date Noted  . Acute pain of both shoulders 03/11/2017  . Encounter for central line placement   . Obstructive hydrocephalus   . Vertebral artery dissection (HCC)   . Ischemic stroke (HCC) 02/20/2017  . Type 2 diabetes mellitus with hyperlipidemia (HCC)   . Uncontrolled type 2 diabetes mellitus with hyperglycemia (HCC)   . Tobacco dependence   . 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 TIA (transient ischemic attack) 12/29/2015  . Hypertension 06/30/2015  . Arteriosclerosis of coronary artery 07/28/2014  . Type 2 diabetes mellitus with hyperglycemia (HCC) 07/28/2014  . Esophageal reflux 07/28/2014  . Hyperlipidemia 09/10/2006    Past Surgical History:  Procedure Laterality Date  . CORONARY  ANGIOPLASTY WITH STENT PLACEMENT  06/18/2008  . CORONARY ANGIOPLASTY WITH STENT PLACEMENT      Prior to Admission medications   Medication Sig Start Date End Date Taking? Authorizing Provider  aspirin EC 325 MG EC tablet Take 1 tablet (325 mg total) by mouth daily. 02/28/17   Narda Bonds, MD  atorvastatin (LIPITOR) 80 MG tablet Take 1 tablet (80 mg total) by mouth daily at 6 PM. 02/27/17   Narda Bonds, MD  buPROPion [facility] SR) 100 MG 12 hr tablet Take 100 mg by mouth 2 (two) times daily.  06/29/15 03/11/17  [provider]  clopidogrel (PLAVIX) 75 MG tablet Take 1 tablet (75 mg total) by mouth daily. 02/28/17   Narda Bonds, MD  glimepiride (AMARYL) 4 MG tablet Take 1 tablet (4 mg total) by mouth daily with breakfast. 12/31/15   Gouru, Deanna Artis, MD  lisinopril (PRINIVIL,ZESTRIL) 40 MG tablet Take 1 tablet (40 mg total) by mouth daily. 02/27/17   Narda Bonds, MD  metFORMIN (GLUCOPHAGE) 1000 MG tablet Take 1,000 mg by mouth 2 (two) times daily with a meal.    [provider]  methylPREDNISolone (MEDROL DOSEPAK) 4 MG TBPK tablet 6 tablets on day 1, then 5 tab day 2, 4 tab on day 3, 3 tab on day 4, 2 tab on day 5 and 1 tab at day 6 03/11/17   Marvel Plan, MD  oxyCODONE-acetaminophen (PERCOCET) 10-325 MG tablet Take 1 tablet by mouth every 6 (six) hours as needed for pain. 03/11/17  Marvel PlanXu, Jindong, MD  oxyCODONE-acetaminophen (PERCOCET/ROXICET) 5-325 MG tablet Take 1 tablet by mouth every 4 (four) hours as needed for severe pain. 03/14/17   Darci CurrentBrown, [place] N, MD    Allergies No known drug allergies  Family History  Problem Relation Age of Onset  . Heart attack Mother   . Heart failure Mother   . Coronary artery disease Father   . Diabetes Father     Social History Social History   Tobacco Use  . Smoking status: Heavy Tobacco Smoker    Packs/day: 0.50    Types: Cigarettes  . Smokeless tobacco: Never Used  . Tobacco comment: on his own terms  Substance Use Topics  .  Alcohol use: No    Alcohol/week: 0.0 oz    Comment: OCCASIONALLY  . Drug use: No    Review of Systems Constitutional: No fever/chills Eyes: No visual changes. ENT: No sore throat. Cardiovascular: Denies chest pain. Respiratory: Denies shortness of breath. Gastrointestinal: No abdominal pain.  No nausea, no vomiting.  No diarrhea.  No constipation. Genitourinary: Negative for dysuria. Musculoskeletal: Negative for neck pain.  Negative for back pain.  Positive for bilateral shoulder pain Integumentary: Negative for rash. Neurological: Negative for headaches, focal weakness or numbness.   ____________________________________________   PHYSICAL EXAM:  VITAL SIGNS: ED Triage Vitals  Enc Vitals Group     BP 03/13/17 2343 133/81     Pulse Rate 03/13/17 2343 79     Resp 03/13/17 2343 18     Temp 03/13/17 2343 98 F (36.7 C)     Temp Source 03/13/17 2343 Oral     SpO2 03/13/17 2343 94 %     Weight 03/13/17 2344 72.6 kg (160 lb)     Height 03/13/17 2344 1.778 m (5\' 10" )     Head Circumference --      Peak Flow --      Pain Score 03/13/17 2343 8     Pain Loc --      Pain Edu? --      Excl. in GC? --     Constitutional: Alert and oriented. Well appearing and in no acute distress. Eyes: Conjunctivae are normal.  Head: Atraumatic. Mouth/Throat: Mucous membranes are moist. Oropharynx non-erythematous. Neck: No stridor.  No cervical spine tenderness to palpation. Cardiovascular: Normal rate, regular rhythm. Good peripheral circulation. Grossly normal heart sounds. Respiratory: Normal respiratory effort.  No retractions. Lungs CTAB. Gastrointestinal: Soft and nontender. No distention.  Musculoskeletal: No lower extremity tenderness nor edema. No gross deformities of extremities. Neurologic:  Normal speech and language. No gross focal neurologic deficits are appreciated.  Skin:  Skin is warm, dry and intact. No rash noted. Psychiatric: Mood and affect are normal. Speech and  behavior are normal.      Procedures   ____________________________________________   INITIAL IMPRESSION / ASSESSMENT AND PLAN / ED COURSE  As part of my medical decision making, I reviewed the following data within the electronic MEDICAL RECORD [id number] year old male presented with above-stated history and physical exam secondary to bilateral shoulder pain with known rotator cuff injury as well as bursitis following steroid injections performed by Dr. Martha ClanKrasinski yesterday.  Patient given IV Toradol 30 mg as well as Decadron 10 mg in the emergency department with complete resolution of pain.  Given the fact that the patient took 3 OxyContin before arrival to the emergency department patient observed in the emergency department for 2 hours with no hypoxia or hypotension during noted during that time.    ____________________________________________  FINAL CLINICAL IMPRESSION(S) / ED DIAGNOSES  Final diagnoses:  Acute pain of both shoulders     MEDICATIONS GIVEN DURING THIS VISIT:  Medications  ketorolac (TORADOL) 30 MG/ML injection 30 mg (30 mg Intravenous Given 03/14/17 0040)  dexamethasone (DECADRON) injection 10 mg (10 mg Intravenous Given 03/14/17 0040)     ED Discharge Orders        Ordered    oxyCODONE-acetaminophen (PERCOCET/ROXICET) 5-325 MG tablet  Every 4 hours PRN     03/14/17 0154       Note:  This document was prepared using Dragon voice recognition software and may include unintentional dictation errors.    Darci Current, MD 03/14/17 [phone]

## 2017-03-14 NOTE — ED Notes (Signed)
Pt reports going to doctor yesterday and had cortisone shots in shoulder blades and he is having pain below his neck below the neck. Family at bedside.

## 2017-03-18 ENCOUNTER — Encounter: Payer: Self-pay | Admitting: Neurology

## 2017-03-18 ENCOUNTER — Other Ambulatory Visit: Payer: Self-pay | Admitting: Sports Medicine

## 2017-03-18 DIAGNOSIS — M25512 Pain in left shoulder: Secondary | ICD-10-CM

## 2017-03-18 DIAGNOSIS — M19012 Primary osteoarthritis, left shoulder: Secondary | ICD-10-CM

## 2017-03-18 DIAGNOSIS — M7542 Impingement syndrome of left shoulder: Secondary | ICD-10-CM

## 2017-03-20 ENCOUNTER — Other Ambulatory Visit: Payer: Self-pay | Admitting: Family Medicine

## 2017-03-20 ENCOUNTER — Telehealth: Payer: Self-pay | Admitting: Family Medicine

## 2017-03-20 MED ORDER — METFORMIN HCL 1000 MG PO TABS: 1000.0000 mg | ORAL_TABLET | Freq: Two times a day (BID) | ORAL | 0 refills | Status: DC

## 2017-03-20 NOTE — Telephone Encounter (Signed)
Pt requesting refill of Metformin 1000 MG send to [facility] on [address]. Pt states he was in the hospital and was told he needed to get back on Metformin again.

## 2017-03-20 NOTE — Telephone Encounter (Signed)
I have sent in one month's supply for now. Does he intend to follow up with us or family medicine in [place] who saw him when he hospitalized [facility]?

## 2017-03-20 NOTE — Telephone Encounter (Signed)
Patient has missed his last two appointments his last diabetic check in office was 06/30/15 please advise. KW

## 2017-03-22 NOTE — Telephone Encounter (Signed)
Patient is going to keep coming here.  [place] sees [place] doctor in [place] for his stroke only.  Because he is unable to transport himself his wife is going to call back for an appointment on March 5.  She needs to find out what time his MRI is and will coordinate accordingly.

## 2017-03-26 ENCOUNTER — Encounter: Payer: Self-pay | Admitting: Neurology

## 2017-03-26 ENCOUNTER — Ambulatory Visit
Admission: RE | Admit: 2017-03-26 | Discharge: 2017-03-26 | Disposition: A | Discharge status: Home / Self Care | Payer: Self-pay | Source: Ambulatory Visit | Attending: Sports Medicine | Admitting: Sports Medicine

## 2017-03-26 ENCOUNTER — Other Ambulatory Visit: Payer: Self-pay | Admitting: Sports Medicine

## 2017-03-26 DIAGNOSIS — M25512 Pain in left shoulder: Secondary | ICD-10-CM | POA: Insufficient documentation

## 2017-03-26 DIAGNOSIS — M19011 Primary osteoarthritis, right shoulder: Secondary | ICD-10-CM | POA: Insufficient documentation

## 2017-03-26 DIAGNOSIS — M25511 Pain in right shoulder: Secondary | ICD-10-CM | POA: Insufficient documentation

## 2017-03-26 DIAGNOSIS — M7542 Impingement syndrome of left shoulder: Secondary | ICD-10-CM | POA: Insufficient documentation

## 2017-03-26 DIAGNOSIS — M7551 Bursitis of right shoulder: Secondary | ICD-10-CM | POA: Insufficient documentation

## 2017-03-26 DIAGNOSIS — M75101 Unspecified rotator cuff tear or rupture of right shoulder, not specified as traumatic: Secondary | ICD-10-CM | POA: Insufficient documentation

## 2017-03-26 DIAGNOSIS — M19012 Primary osteoarthritis, left shoulder: Secondary | ICD-10-CM | POA: Insufficient documentation

## 2017-03-26 DIAGNOSIS — M7552 Bursitis of left shoulder: Secondary | ICD-10-CM | POA: Insufficient documentation

## 2017-03-27 ENCOUNTER — Encounter: Payer: Self-pay | Admitting: *Deleted

## 2017-03-27 DIAGNOSIS — Z006 Encounter for examination for normal comparison and control in clinical research program: Secondary | ICD-10-CM

## 2017-03-27 NOTE — Progress Notes (Signed)
STROKE-AF Research study month 1 follow up telephone call completed. Next research required telephone call is due between 24/Jul/19- 23/Aug/19. I thanked patient for participating in study.

## 2017-03-27 NOTE — Progress Notes (Signed)
Letter patient requested put in mail.Pt sent mychart message.

## 2017-04-10 ENCOUNTER — Ambulatory Visit: Payer: Self-pay | Admitting: Neurology

## 2017-04-15 ENCOUNTER — Encounter
Admission: RE | Admit: 2017-04-15 | Discharge: 2017-04-15 | Disposition: A | Discharge status: Home / Self Care | Payer: Self-pay | Source: Ambulatory Visit | Attending: Orthopedic Surgery | Admitting: Orthopedic Surgery

## 2017-04-15 ENCOUNTER — Other Ambulatory Visit: Payer: Self-pay

## 2017-04-15 DIAGNOSIS — Z01818 Encounter for other preprocedural examination: Secondary | ICD-10-CM | POA: Insufficient documentation

## 2017-04-15 HISTORY — DX: Gastro-esophageal reflux disease without esophagitis: K21.9

## 2017-04-15 NOTE — Patient Instructions (Signed)
Your procedure is scheduled on: April 16, 2017 Hope from Dr. Eliane DecreePatel's office will verify  Surgery date depending on timing of plavix stoppage. Report to Day Surgery on the 2nd floor of the [facility]. To find out your arrival time, please call [phone] between 1PM - 3PM on: April 15, 2017   REMEMBER: Instructions that are not followed completely may result in serious medical risk, up to and including death; or upon the discretion of your surgeon and anesthesiologist your surgery may need to be rescheduled.  Do not eat food after midnight the night before your procedure.  No gum chewing, lozengers or hard candies.  You may however, drink CLEAR liquids up to 2 hours before you are scheduled to arrive for your surgery. Do not drink anything within 2 hours of the start of your surgery.  Clear liquids include: - water     Do NOT drink anything that is not on this list.  Type 1 and Type 2 diabetics should only drink water.  No Alcohol for 24 hours before or after surgery.  No Smoking including e-cigarettes for 24 hours prior to surgery.  No chewable tobacco products for at least 6 hours prior to surgery.  No nicotine patches on the day of surgery.  On the morning of surgery brush your teeth with toothpaste and water, you may rinse your mouth with mouthwash if you wish. Do not swallow any toothpaste or mouthwash.  Notify your doctor if there is any change in your medical condition (cold, fever, infection).  Do not wear jewelry, make-up, hairpins, clips or nail polish.  Do not wear lotions, powders, or perfumes. You may not wear deodorant.  Do not shave 48 hours prior to surgery. Men may shave face and neck.  Contacts and dentures may not be worn into surgery.  Do not bring valuables to the hospital, including drivers license, insurance or credit cards.  [facility] is not responsible for any belongings or valuables.   TAKE THESE MEDICATIONS THE MORNING OF  SURGERY: cymbalta tramadol  Use CHG Soap or wipes as directed on instruction sheet.   Stop Metformin  2 days  prior to surgery.   Follow recommendations from Cardiologist, Pulmonologist or PCP regarding stopping Aspirin, Coumadin, Plavix, Eliquis, Pradaxa, or Pletal.  Stop Anti-inflammatories (NSAIDS) such as Advil, Aleve, Ibuprofen, Motrin, Naproxen, Naprosyn and Aspirin based products such as Excedrin, Goodys Powder, BC Powder. (May take Tylenol or Acetaminophen if needed.)  Stop ANY OVER THE COUNTER supplements until after surgery. (May continue Vitamin D, Vitamin B, and multivitamin.)  Wear comfortable clothing (specific to your surgery type) to the hospital.  Plan for stool softeners for home use.  If you are being admitted to the hospital overnight, leave your suitcase in the car. After surgery it may be brought to your room.  If you are being discharged the day of surgery, you will not be allowed to drive home. You will need a responsible adult to drive you home and stay with you that night.   If you are taking public transportation, you will need to have a responsible adult with you. Please confirm with your physician that it is acceptable to use public transportation.   Please call [phone] if you have any questions about these instructions.

## 2017-04-18 MED ORDER — CEFAZOLIN SODIUM-DEXTROSE 2-4 GM/100ML-% IV SOLN
2.0000 g | Freq: Once | INTRAVENOUS | Status: AC
  Administered 2017-04-19: 2 g via INTRAVENOUS

## 2017-04-19 ENCOUNTER — Encounter
Admission: RE | Disposition: A | Discharge status: Home / Self Care | Payer: Self-pay | Source: Ambulatory Visit | Attending: Orthopedic Surgery

## 2017-04-19 ENCOUNTER — Other Ambulatory Visit: Payer: Self-pay

## 2017-04-19 ENCOUNTER — Ambulatory Visit
Admission: RE | Admit: 2017-04-19 | Discharge: 2017-04-19 | Disposition: A | Discharge status: Home / Self Care | Payer: Self-pay | Source: Ambulatory Visit | Attending: Orthopedic Surgery | Admitting: Orthopedic Surgery

## 2017-04-19 ENCOUNTER — Ambulatory Visit: Payer: Self-pay | Admitting: Certified Registered"

## 2017-04-19 DIAGNOSIS — M7521 Bicipital tendinitis, right shoulder: Secondary | ICD-10-CM | POA: Insufficient documentation

## 2017-04-19 DIAGNOSIS — Z7982 Long term (current) use of aspirin: Secondary | ICD-10-CM | POA: Insufficient documentation

## 2017-04-19 DIAGNOSIS — M7541 Impingement syndrome of right shoulder: Secondary | ICD-10-CM | POA: Insufficient documentation

## 2017-04-19 DIAGNOSIS — E782 Mixed hyperlipidemia: Secondary | ICD-10-CM | POA: Insufficient documentation

## 2017-04-19 DIAGNOSIS — Z79899 Other long term (current) drug therapy: Secondary | ICD-10-CM | POA: Insufficient documentation

## 2017-04-19 DIAGNOSIS — Z955 Presence of coronary angioplasty implant and graft: Secondary | ICD-10-CM | POA: Insufficient documentation

## 2017-04-19 DIAGNOSIS — K219 Gastro-esophageal reflux disease without esophagitis: Secondary | ICD-10-CM | POA: Insufficient documentation

## 2017-04-19 DIAGNOSIS — M65811 Other synovitis and tenosynovitis, right shoulder: Secondary | ICD-10-CM | POA: Insufficient documentation

## 2017-04-19 DIAGNOSIS — Z7984 Long term (current) use of oral hypoglycemic drugs: Secondary | ICD-10-CM | POA: Insufficient documentation

## 2017-04-19 DIAGNOSIS — E1151 Type 2 diabetes mellitus with diabetic peripheral angiopathy without gangrene: Secondary | ICD-10-CM | POA: Insufficient documentation

## 2017-04-19 DIAGNOSIS — Z7902 Long term (current) use of antithrombotics/antiplatelets: Secondary | ICD-10-CM | POA: Insufficient documentation

## 2017-04-19 DIAGNOSIS — I69354 Hemiplegia and hemiparesis following cerebral infarction affecting left non-dominant side: Secondary | ICD-10-CM | POA: Insufficient documentation

## 2017-04-19 DIAGNOSIS — I10 Essential (primary) hypertension: Secondary | ICD-10-CM | POA: Insufficient documentation

## 2017-04-19 DIAGNOSIS — M75111 Incomplete rotator cuff tear or rupture of right shoulder, not specified as traumatic: Secondary | ICD-10-CM | POA: Insufficient documentation

## 2017-04-19 DIAGNOSIS — I2510 Atherosclerotic heart disease of native coronary artery without angina pectoris: Secondary | ICD-10-CM | POA: Insufficient documentation

## 2017-04-19 DIAGNOSIS — M19011 Primary osteoarthritis, right shoulder: Secondary | ICD-10-CM | POA: Insufficient documentation

## 2017-04-19 HISTORY — PX: SHOULDER ARTHROSCOPY WITH ROTATOR CUFF REPAIR AND SUBACROMIAL DECOMPRESSION: SHX5686

## 2017-04-19 LAB — GLUCOSE, CAPILLARY
Glucose-Capillary: 163 mg/dL — ABNORMAL HIGH (ref 65–99)
Glucose-Capillary: 195 mg/dL — ABNORMAL HIGH (ref 65–99)

## 2017-04-19 SURGERY — SHOULDER ARTHROSCOPY WITH ROTATOR CUFF REPAIR AND SUBACROMIAL DECOMPRESSION
Anesthesia: General | Site: Shoulder | Laterality: Right | Wound class: Clean

## 2017-04-19 MED ORDER — DEXTROSE 5 % IV SOLN
INTRAVENOUS | Status: DC | PRN
  Administered 2017-04-19: 20 ug/min via INTRAVENOUS

## 2017-04-19 MED ORDER — ACETAMINOPHEN 10 MG/ML IV SOLN
INTRAVENOUS | Status: DC | PRN
  Administered 2017-04-19: 1000 mg via INTRAVENOUS

## 2017-04-19 MED ORDER — FAMOTIDINE 20 MG PO TABS
20.0000 mg | ORAL_TABLET | Freq: Once | ORAL | Status: AC
  Administered 2017-04-19: 20 mg via ORAL

## 2017-04-19 MED ORDER — FENTANYL CITRATE (PF) 100 MCG/2ML IJ SOLN: 25.0000 ug | INTRAMUSCULAR | Status: DC | PRN

## 2017-04-19 MED ORDER — MIDAZOLAM HCL 2 MG/2ML IJ SOLN
INTRAMUSCULAR | Status: AC
  Filled 2017-04-19: qty 2

## 2017-04-19 MED ORDER — ONDANSETRON 4 MG PO TBDP: 4.0000 mg | ORAL_TABLET | Freq: Three times a day (TID) | ORAL | 0 refills | Status: DC | PRN

## 2017-04-19 MED ORDER — FENTANYL CITRATE (PF) 100 MCG/2ML IJ SOLN
INTRAMUSCULAR | Status: DC | PRN
  Administered 2017-04-19 (×3): 50 ug via INTRAVENOUS

## 2017-04-19 MED ORDER — BUPIVACAINE LIPOSOME 1.3 % IJ SUSP
INTRAMUSCULAR | Status: AC
  Filled 2017-04-19: qty 20

## 2017-04-19 MED ORDER — SUCCINYLCHOLINE CHLORIDE 20 MG/ML IJ SOLN
INTRAMUSCULAR | Status: AC
  Filled 2017-04-19: qty 1

## 2017-04-19 MED ORDER — BUPIVACAINE LIPOSOME 1.3 % IJ SUSP
INTRAMUSCULAR | Status: DC | PRN
  Administered 2017-04-19: 20 mL via PERINEURAL

## 2017-04-19 MED ORDER — LIDOCAINE-EPINEPHRINE 1 %-1:100000 IJ SOLN
INTRAMUSCULAR | Status: DC | PRN
  Administered 2017-04-19: 3.5 mL

## 2017-04-19 MED ORDER — ACETAMINOPHEN 10 MG/ML IV SOLN
INTRAVENOUS | Status: AC
Start: 2017-04-19 — End: ?
  Filled 2017-04-19: qty 100

## 2017-04-19 MED ORDER — MIDAZOLAM HCL 2 MG/2ML IJ SOLN
1.0000 mg | Freq: Once | INTRAMUSCULAR | Status: AC
  Administered 2017-04-19: 1 mg via INTRAVENOUS

## 2017-04-19 MED ORDER — FENTANYL CITRATE (PF) 100 MCG/2ML IJ SOLN
INTRAMUSCULAR | Status: AC
  Administered 2017-04-19: 50 ug via INTRAVENOUS
  Filled 2017-04-19: qty 2

## 2017-04-19 MED ORDER — FAMOTIDINE 20 MG PO TABS
ORAL_TABLET | ORAL | Status: AC
  Administered 2017-04-19: 20 mg via ORAL
  Filled 2017-04-19: qty 1

## 2017-04-19 MED ORDER — EPHEDRINE SULFATE 50 MG/ML IJ SOLN
INTRAMUSCULAR | Status: AC
  Filled 2017-04-19: qty 1

## 2017-04-19 MED ORDER — SEVOFLURANE IN SOLN
RESPIRATORY_TRACT | Status: AC
  Filled 2017-04-19: qty 250

## 2017-04-19 MED ORDER — ACETAMINOPHEN 500 MG PO TABS: 1000.0000 mg | ORAL_TABLET | Freq: Three times a day (TID) | ORAL | 2 refills | Status: AC

## 2017-04-19 MED ORDER — ONDANSETRON HCL 4 MG/2ML IJ SOLN
4.0000 mg | Freq: Once | INTRAMUSCULAR | Status: AC | PRN
  Administered 2017-04-19: 4 mg via INTRAVENOUS

## 2017-04-19 MED ORDER — BUPIVACAINE HCL (PF) 0.5 % IJ SOLN
INTRAMUSCULAR | Status: AC
  Filled 2017-04-19: qty 10

## 2017-04-19 MED ORDER — ROCURONIUM BROMIDE 50 MG/5ML IV SOLN
INTRAVENOUS | Status: AC
  Filled 2017-04-19: qty 1

## 2017-04-19 MED ORDER — FENTANYL CITRATE (PF) 100 MCG/2ML IJ SOLN
INTRAMUSCULAR | Status: AC
  Filled 2017-04-19: qty 2

## 2017-04-19 MED ORDER — ROPIVACAINE HCL 2 MG/ML IJ SOLN
INTRAMUSCULAR | Status: AC | PRN
  Administered 2017-04-19: 3.5 mL

## 2017-04-19 MED ORDER — EPINEPHRINE PF 1 MG/ML IJ SOLN
INTRAMUSCULAR | Status: AC
  Filled 2017-04-19: qty 1

## 2017-04-19 MED ORDER — LIDOCAINE HCL (PF) 1 % IJ SOLN
INTRAMUSCULAR | Status: DC | PRN
  Administered 2017-04-19: .8 mL via SUBCUTANEOUS

## 2017-04-19 MED ORDER — CEFAZOLIN SODIUM-DEXTROSE 2-4 GM/100ML-% IV SOLN
INTRAVENOUS | Status: AC
  Filled 2017-04-19: qty 100

## 2017-04-19 MED ORDER — BUPIVACAINE HCL (PF) 0.5 % IJ SOLN
INTRAMUSCULAR | Status: DC | PRN
  Administered 2017-04-19: 10 mL

## 2017-04-19 MED ORDER — LACTATED RINGERS IV SOLN
INTRAVENOUS | Status: DC | PRN
  Administered 2017-04-19: 26 mL

## 2017-04-19 MED ORDER — SUGAMMADEX SODIUM 200 MG/2ML IV SOLN
INTRAVENOUS | Status: AC
  Filled 2017-04-19: qty 2

## 2017-04-19 MED ORDER — MIDAZOLAM HCL 2 MG/2ML IJ SOLN
INTRAMUSCULAR | Status: AC
  Administered 2017-04-19: 1 mg via INTRAVENOUS
  Filled 2017-04-19: qty 2

## 2017-04-19 MED ORDER — OXYCODONE HCL 5 MG PO TABS: 5.0000 mg | ORAL_TABLET | ORAL | 0 refills | Status: AC | PRN

## 2017-04-19 MED ORDER — PROPOFOL 10 MG/ML IV BOLUS
INTRAVENOUS | Status: AC
  Filled 2017-04-19: qty 20

## 2017-04-19 MED ORDER — LIDOCAINE-EPINEPHRINE 1 %-1:100000 IJ SOLN
INTRAMUSCULAR | Status: AC
  Filled 2017-04-19: qty 1

## 2017-04-19 MED ORDER — EPINEPHRINE 30 MG/30ML IJ SOLN
INTRAMUSCULAR | Status: AC
  Filled 2017-04-19: qty 1

## 2017-04-19 MED ORDER — SODIUM CHLORIDE 0.9 % IV SOLN
INTRAVENOUS | Status: DC
  Administered 2017-04-19: 1000 mL via INTRAVENOUS

## 2017-04-19 MED ORDER — DEXAMETHASONE SODIUM PHOSPHATE 10 MG/ML IJ SOLN
INTRAMUSCULAR | Status: DC | PRN
  Administered 2017-04-19: 10 mg via INTRAVENOUS

## 2017-04-19 MED ORDER — ROCURONIUM BROMIDE 100 MG/10ML IV SOLN
INTRAVENOUS | Status: DC | PRN
  Administered 2017-04-19: 50 mg via INTRAVENOUS
  Administered 2017-04-19 (×2): 20 mg via INTRAVENOUS

## 2017-04-19 MED ORDER — PHENYLEPHRINE HCL 10 MG/ML IJ SOLN
INTRAMUSCULAR | Status: AC
  Filled 2017-04-19: qty 1

## 2017-04-19 MED ORDER — PROPOFOL 10 MG/ML IV BOLUS
INTRAVENOUS | Status: DC | PRN
  Administered 2017-04-19: 40 mg via INTRAVENOUS
  Administered 2017-04-19: 150 mg via INTRAVENOUS

## 2017-04-19 MED ORDER — SUGAMMADEX SODIUM 200 MG/2ML IV SOLN
INTRAVENOUS | Status: DC | PRN
  Administered 2017-04-19: 139.8 mg via INTRAVENOUS

## 2017-04-19 MED ORDER — PHENYLEPHRINE HCL 10 MG/ML IJ SOLN
INTRAMUSCULAR | Status: DC | PRN
  Administered 2017-04-19 (×5): 100 ug via INTRAVENOUS

## 2017-04-19 MED ORDER — ROPIVACAINE HCL 2 MG/ML IJ SOLN
INTRAMUSCULAR | Status: AC
  Filled 2017-04-19: qty 20

## 2017-04-19 MED ORDER — LIDOCAINE HCL (CARDIAC) 20 MG/ML IV SOLN
INTRAVENOUS | Status: DC | PRN
  Administered 2017-04-19: 40 mg via INTRAVENOUS

## 2017-04-19 MED ORDER — EPHEDRINE SULFATE 50 MG/ML IJ SOLN
INTRAMUSCULAR | Status: DC | PRN
  Administered 2017-04-19: 5 mg via INTRAVENOUS
  Administered 2017-04-19: 15 mg via INTRAVENOUS

## 2017-04-19 MED ORDER — FENTANYL CITRATE (PF) 100 MCG/2ML IJ SOLN
50.0000 ug | Freq: Once | INTRAMUSCULAR | Status: AC
  Administered 2017-04-19: 50 ug via INTRAVENOUS

## 2017-04-19 MED ORDER — LIDOCAINE HCL (PF) 1 % IJ SOLN
INTRAMUSCULAR | Status: AC
  Filled 2017-04-19: qty 5

## 2017-04-19 SURGICAL SUPPLY — 80 items
ADAPTER IRRIG TUBE 2 SPIKE SOL (ADAPTER) ×8 IMPLANT
ANCHOR 2.3 SP SGL 1.2 XBRAID (Anchor) ×3 IMPLANT
ANCHOR 2.3MM SP SGL 1.2 XBRAID (Anchor) ×1 IMPLANT
ANCHOR SUT BIO SW 4.75X19.1 (Anchor) ×4 IMPLANT
BLADE OSCILLATING/SAGITTAL (BLADE)
BLADE SW THK.38XMED LNG THN (BLADE) IMPLANT
BUR BR 5.5 12 FLUTE (BURR) ×4 IMPLANT
BUR RADIUS 4.0X18.5 (BURR) ×4 IMPLANT
BUR RADIUS 5.5 (BURR) ×4 IMPLANT
CANNULA 5.75X7 CRYSTAL CLEAR (CANNULA) ×4 IMPLANT
CANNULA PARTIAL THREAD 2X7 (CANNULA) ×4 IMPLANT
CANNULA TWIST IN 8.25X9CM (CANNULA) IMPLANT
CHLORAPREP W/TINT 26ML (MISCELLANEOUS) ×4 IMPLANT
CLOSURE WOUND 1/2 X4 (GAUZE/BANDAGES/DRESSINGS) ×1
COOLER POLAR GLACIER W/PUMP (MISCELLANEOUS) ×4 IMPLANT
COVER LIGHT HANDLE STERIS (MISCELLANEOUS) ×4 IMPLANT
CRADLE LAMINECT ARM (MISCELLANEOUS) ×4 IMPLANT
DERMABOND ADVANCED (GAUZE/BANDAGES/DRESSINGS) ×2
DERMABOND ADVANCED .7 DNX12 (GAUZE/BANDAGES/DRESSINGS) ×2 IMPLANT
DRAPE IMP U-DRAPE 54X76 (DRAPES) ×8 IMPLANT
DRAPE INCISE IOBAN 66X45 STRL (DRAPES) ×4 IMPLANT
DRAPE SHEET LG 3/4 BI-LAMINATE (DRAPES) ×4 IMPLANT
DRAPE STERI 35X30 U-POUCH (DRAPES) ×4 IMPLANT
DRAPE U-SHAPE 47X51 STRL (DRAPES) ×8 IMPLANT
ELECT REM PT RETURN 9FT ADLT (ELECTROSURGICAL) ×4
ELECTRODE REM PT RTRN 9FT ADLT (ELECTROSURGICAL) ×2 IMPLANT
GAUZE PETRO XEROFOAM 1X8 (MISCELLANEOUS) ×4 IMPLANT
GAUZE SPONGE 4X4 12PLY STRL (GAUZE/BANDAGES/DRESSINGS) ×4 IMPLANT
GLOVE BIOGEL PI IND STRL 8 (GLOVE) ×2 IMPLANT
GLOVE BIOGEL PI INDICATOR 8 (GLOVE) ×2
GLOVE SURG SYN 7.5  E (GLOVE) ×2
GLOVE SURG SYN 7.5 E (GLOVE) ×2 IMPLANT
GOWN STRL REUS W/ TWL LRG LVL3 (GOWN DISPOSABLE) ×2 IMPLANT
GOWN STRL REUS W/TWL LRG LVL3 (GOWN DISPOSABLE) ×2
GOWN STRL REUS W/TWL LRG LVL4 (GOWN DISPOSABLE) ×4 IMPLANT
IV LACTATED RINGER IRRG 3000ML (IV SOLUTION) ×52
IV LR IRRIG 3000ML ARTHROMATIC (IV SOLUTION) ×52 IMPLANT
KIT STABILIZATION SHOULDER (MISCELLANEOUS) ×4 IMPLANT
KIT SUTURETAK 3.0 INSERT PERC (KITS) ×4 IMPLANT
KIT TURNOVER KIT A (KITS) ×4 IMPLANT
MANIFOLD NEPTUNE II (INSTRUMENTS) ×4 IMPLANT
MASK FACE SPIDER DISP (MASK) ×4 IMPLANT
MAT ABSORB  FLUID 56X50 GRAY (MISCELLANEOUS) ×4
MAT ABSORB FLUID 56X50 GRAY (MISCELLANEOUS) ×4 IMPLANT
NDL SAFETY ECLIPSE 18X1.5 (NEEDLE) ×2 IMPLANT
NEEDLE HYPO 18GX1.5 SHARP (NEEDLE) ×2
NEEDLE HYPO 22GX1.5 SAFETY (NEEDLE) ×4 IMPLANT
NEEDLE MAYO 6 CRC TAPER PT (NEEDLE) IMPLANT
NEEDLE SCORPION MULTI FIRE (NEEDLE) ×4 IMPLANT
PACK ARTHROSCOPY SHOULDER (MISCELLANEOUS) ×4 IMPLANT
PAD ABD DERMACEA PRESS 5X9 (GAUZE/BANDAGES/DRESSINGS) ×4 IMPLANT
PAD WRAPON POLAR SHDR XLG (MISCELLANEOUS) ×2 IMPLANT
SET TUBE SUCT SHAVER OUTFL 24K (TUBING) ×4 IMPLANT
SET TUBE TIP INTRA-ARTICULAR (MISCELLANEOUS) ×4 IMPLANT
SLING ULTRA II M (MISCELLANEOUS) ×4 IMPLANT
STAPLER SKIN PROX 35W (STAPLE) IMPLANT
STRAP SAFETY 5IN WIDE (MISCELLANEOUS) ×4 IMPLANT
STRIP CLOSURE SKIN 1/2X4 (GAUZE/BANDAGES/DRESSINGS) ×3 IMPLANT
SUT ETHILON 3-0 (SUTURE) ×4 IMPLANT
SUT ETHILON 4-0 (SUTURE) ×2
SUT ETHILON 4-0 FS2 18XMFL BLK (SUTURE) ×2
SUT LASSO 90 DEG SD STR (SUTURE) ×4 IMPLANT
SUT MNCRL 4-0 (SUTURE) ×2
SUT MNCRL 4-0 27XMFL (SUTURE) ×2
SUT PROLENE 0 CT 2 (SUTURE) IMPLANT
SUT PROLENE 6 0 P 1 18 (SUTURE) IMPLANT
SUT TICRON 2-0 30IN 311381 (SUTURE) IMPLANT
SUT VIC AB 0 CT1 36 (SUTURE) ×4 IMPLANT
SUT VIC AB 2-0 CT2 27 (SUTURE) ×4 IMPLANT
SUT VICRYL 3-0 27IN (SUTURE) ×4 IMPLANT
SUTURE ETHLN 4-0 FS2 18XMF BLK (SUTURE) ×2 IMPLANT
SUTURE MNCRL 4-0 27XMF (SUTURE) ×2 IMPLANT
SYR 10ML LL (SYRINGE) ×4 IMPLANT
TAPE CLOTH 3X10 WHT NS LF (GAUZE/BANDAGES/DRESSINGS) ×4 IMPLANT
TAPE MICROFOAM 4IN (TAPE) ×4 IMPLANT
TUBING ARTHRO INFLOW-ONLY STRL (TUBING) ×4 IMPLANT
TUBING CONNECTING 10 (TUBING) ×3 IMPLANT
TUBING CONNECTING 10' (TUBING) ×1
WAND HAND CNTRL MULTIVAC 90 (MISCELLANEOUS) IMPLANT
WRAPON POLAR PAD SHDR XLG (MISCELLANEOUS) ×4

## 2017-04-19 NOTE — Anesthesia Post-op Follow-up Note (Signed)
Anesthesia QCDR form completed.        

## 2017-04-19 NOTE — Anesthesia Procedure Notes (Signed)
Anesthesia Regional Block: Interscalene brachial plexus block   Pre-Anesthetic Checklist: ,, timeout performed, Correct Patient, Correct Site, Correct Laterality, Correct Procedure, Correct Position, site marked, Risks and benefits discussed,  Surgical consent,  Pre-op evaluation,  At surgeon's request and post-op pain management  Laterality: Right  Prep: chloraprep       Needles:  Injection technique: Single-shot  Needle Type: Echogenic Stimulator Needle     Needle Length: 9cm  Needle Gauge: 21     Additional Needles:   Procedures:, nerve stimulator,,, ultrasound used (permanent image in chart),,,,   Nerve Stimulator or Paresthesia:  Response: biceps flexion, 0.8 mA,   Additional Responses:   Narrative:  Start time: 04/19/2017 7:29 AM End time: 04/19/2017 7:34 AM Injection made incrementally with aspirations every 5 mL.  Performed by: Personally   Additional Notes: Functioning IV was confirmed and monitors were applied.  A 50mm 22ga Stimuplex needle was used. Sterile prep and drape,hand hygiene and sterile gloves were used.  Negative aspiration and negative test dose prior to incremental administration of local anesthetic. The patient tolerated the procedure well.

## 2017-04-19 NOTE — Anesthesia Postprocedure Evaluation (Signed)
Anesthesia Post Note  Patient: Kenneth [name]  Procedure(s) Performed: SHOULDER ARTHROSCOPY WITH ROTATOR CUFF REPAIR AND SUBACROMIAL DECOMPRESSION (Right Shoulder)  Patient location during evaluation: PACU Anesthesia Type: General Level of consciousness: awake and alert Pain management: pain level controlled Vital Signs Assessment: post-procedure vital signs reviewed and stable Respiratory status: spontaneous breathing and respiratory function stable Cardiovascular status: stable Anesthetic complications: no     Last Vitals:  Vitals:   04/19/17 1222 04/19/17 1251  BP: 127/78 133/79  Pulse: 77 74  Resp: 16 16  Temp: (!) 36.4 C 36.5 C  SpO2: 93% 97%    Last Pain:  Vitals:   04/19/17 1251  TempSrc: Temporal  PainSc: 1                  [name]

## 2017-04-19 NOTE — Anesthesia Procedure Notes (Signed)
Procedure Name: Intubation Date/Time: 04/19/2017 7:51 AM Performed by: Allean Found, CRNA Pre-anesthesia Checklist: Patient identified, Emergency Drugs available, Suction available, Patient being monitored and Timeout performed Patient Re-evaluated:Patient Re-evaluated prior to induction Oxygen Delivery Method: Circle system utilized Preoxygenation: Pre-oxygenation with 100% oxygen Induction Type: IV induction Ventilation: Mask ventilation with difficulty and Two handed mask ventilation required Laryngoscope Size: Mac and 4 Grade View: Grade II Tube type: Oral Tube size: 7.5 mm Number of attempts: 1 Airway Equipment and Method: Stylet Placement Confirmation: ETT inserted through vocal cords under direct vision,  positive ETCO2 and breath sounds checked- equal and bilateral Secured at: 23 cm Tube secured with: Tape Dental Injury: Teeth and Oropharynx as per pre-operative assessment  Difficulty Due To: Difficult Airway- due to anterior larynx Comments: Mask away difficult due to facial hair, required two hands,  Intubation class 2 due to anterior larynx

## 2017-04-19 NOTE — Op Note (Signed)
SURGERY DATE: 04/19/2017  PRE-OP DIAGNOSIS:  1. Right subacromial impingement 2. Right biceps tendinopathy 3. Right rotator cuff tear 4. Right acromioclavicular joint osteoarthritis  POST-OP DIAGNOSIS: 1. Right subacromial impingement 2. Right biceps tendinopathy 3. Right rotator cuff tear 4. Right acromioclavicular joint osteoarthritis  PROCEDURES:  1. Right arthroscopic rotator cuff repair 2. Right arthroscopic distal clavicle excision 3. Right extensive debridement of shoulder (glenohumeral and subacromial spaces) 4. Right subacromial decompression  SURGEON: Rosealee AlbeeSunny H. Stephanne Greeley, MD  ANESTHESIA: Gen with postoperative interscalene block  ESTIMATED BLOOD LOSS: 10cc  DRAINS:  none  TOTAL IV FLUIDS: per anesthesia   SPECIMENS: none  IMPLANTS:   - Stryker Iconix SPEED 2.333mm Anchor double loaded with tape and suture - x1 - Arthrex 4.7475mm SwiveLock - x1  OPERATIVE FINDINGS:  Examination under anesthesia: A careful examination under anesthesia was performed.  Passive range of motion was: FF: 160; ER at side: 50; ER in abduction: 95; IR in abduction: 50.  Anterior load shift: NT.  Posterior load shift: NT.  Sulcus in neutral: NT.  Sulcus in ER: NT.    Intra-operative findings: A thorough arthroscopic examination of the shoulder was performed.  The findings are: 1. Biceps tendon: tendinopathy with significant erythema 2. Superior labrum:normal 3. Posterior labrum and capsule: normal 4. Inferior capsule and inferior recess: normal 5. Glenoid cartilage surface: normal 6. Supraspinatus attachment: Almost full-thickness bursal sided tear of the anterior attachment 7. Posterior rotator cuff attachment: normal 8. Humeral head articular cartilage: normal 9. Rotator interval: significant synovitis 10: Subscapularis tendon: attachment intact 11. Anterior labrum: injected and erythematous 12. IGHL: normal; cord-like MGHL  OPERATIVE REPORT:   Indications for procedure: Kenneth [name] is  a 52 y.o. year old male with shoulder pain that began after a fall from a stroke ~3 months ago. He failed conservative measures including activity modification and corticosteroid injections. MRI showed a significant almost full-thickness tear of the anterior supraspinatus as well as AC joint arthritis. After discussion of risks, benefits, and alternatives to surgery, the patient elected to proceed with surgery.    Procedure in detail:  I identified Kenneth [name] in the pre-operative holding area.  I marked the operative shoulder with my initials. I reviewed the risks and benefits of the proposed surgical intervention, and the patient (and/or patient's guardian) wished to proceed.  Anesthesia was then performed with an interscalene Exparil block.  The patient was transferred to the operative suite and placed in the beach chair position.    SCDs were placed on the lower extremities. Appropriate IV antibiotics were administered prior to incision. The operative upper extremity was then prepped and draped in standard fashion. A time out was performed confirming the correct extremity, correct patient, and correct procedure.   I then created a standard posterior portal with an 11 blade. The glenohumeral joint was easily entered with a blunt trochar and the arthroscope introduced. The findings of diagnostic arthroscopy are described above. I debrided degenerative tissue including the synovitic tissue about the rotator interval and anterior labrum. I then coagulated the inflamed synovium to obtain hemostasis and reduce the risk of post-operative swelling using an Arthrocare radiofrequency device. I performed a biceps tenotomy using an arthroscopic scissors and used a motorized shaver to debride the stump back to a stable base.   Next, the arthroscope was then introduced into the subacromial space. A direct lateral portal was created with an 11-blade after spinal needle localization. An extensive subacromial  bursectomy was performed using a combination of the shaver  and Arthrocare wand. The entire acromial undersurface was exposed and the CA ligament was subperiosteally elevated to expose the anterior acromial hook. A 5.25mm barrel burr was used to create a flat anterior and lateral aspect of the acromion, converting it from a Type 2 to a Type 1 acromion. Care was made to keep the deltoid fascia intact.  I then turned my attention to the arthroscopic distal clavicle excision. I identified the acromioclavicular joint. Surrounding bursal tissue was debrided and the edges of the joint were identified. I used the 5.27mm barrel burr to remove the distal clavicle parallel to the edge of the acromion. I was able to fit two widths of the burr into the space between the distal clavicle and acromion, signifying that I had removed ~67mm of distal clavicle. Hemostasis was achieved with an Arthrocare wand.    A 70 degree arthroscope was used for better visualization after placing the camera in the lateral portal. An accessory anterolateral portal was made. Then, I debrided the remaining supraspinatus tendon to expose the entire bony footprint. I prepared the footprint using an awl to stimulate healing. The edges of the torn rotator cuff were gently trimmed with the shaver.   I then percutaneously placed one medial row anchor (Stryker Iconix SPEED). This was placed at the center of the tear at the articular margin. I then shuttled each strand of suture from each anchor in a horizontal mattress fashion through the rotator cuff just lateral to the musculotendinous junction, in such a fashion that they were evenly spaced along the tear. This was done using a Scorpion suture passer. The thinner sutures were tied together arthroscopically.  I then took all four limbs of suture and fixed them 1 cm distal to the tip of the greater tuberosity in line with the medial anchor to create a double row suture configuration. Lateral row  fixation was obtained with one 4.75 mm Arthrex SwiveLock anchor. Tension in the tapes was adjusted and the inner locking mechanism of the anchors deployed. This afforded homogeneous compression of the tendon across the prepared footprint. There was a slight dog ear anteriorly. The FiberWire from the SwiveLock anchor was passed through this and tied, reducing the dog ear.   Fluid was evacuated from the shoulder, and the portals were closed with 3-0 Nylon. Xeroform was applied to the portals. A sterile dressing was applied, followed by a Polar Care sleeve and a SlingShot shoulder immobilizer/sling. The patient awoke from anesthesia without difficulty and was transferred to the PACU in stable condition.     COMPLICATIONS: none  DISPOSITION: plan for discharge home after recovery in PACU   POSTOPERATIVE PLAN: Remain in sling (except hygiene and elbow/wrist/hand RoM exercises as instructed by PT) x 6 weeks and NWB for this time. PT to begin 3-4 days after surgery. Rotator cuff repair rehab protocol.

## 2017-04-19 NOTE — Transfer of Care (Signed)
Immediate Anesthesia Transfer of Care Note  Patient: Kenneth [name]  Procedure(s) Performed: SHOULDER ARTHROSCOPY WITH ROTATOR CUFF REPAIR AND SUBACROMIAL DECOMPRESSION (Right Shoulder)  Patient Location: PACU  Anesthesia Type:General  Level of Consciousness: sedated  Airway & Oxygen Therapy: Patient Spontanous Breathing and Patient connected to nasal cannula oxygen  Post-op Assessment: Report given to RN and Post -op Vital signs reviewed and stable  Post vital signs: Reviewed and stable  Last Vitals:  Vitals Value Taken Time  BP    Temp    Pulse    Resp    SpO2      Last Pain:  Vitals:   04/19/17 0735  TempSrc:   PainSc: 0-No pain         Complications: No apparent anesthesia complications

## 2017-04-19 NOTE — Anesthesia Preprocedure Evaluation (Signed)
Anesthesia Evaluation  Patient identified by MRN, date of birth, ID band Patient awake    Reviewed: Allergy & Precautions, NPO status , Patient's Chart, lab work & pertinent test results  History of Anesthesia Complications Negative for: history of anesthetic complications  Airway Mallampati: II       Dental   Pulmonary neg sleep apnea, neg COPD, Current Smoker,           Cardiovascular hypertension, Pt. on medications + CAD, + Cardiac Stents and + Peripheral Vascular Disease  (-) dysrhythmias (-) Valvular Problems/Murmurs     Neuro/Psych TIACVA (L arm weakness. Tingling, numbness still present), Residual Symptoms    GI/Hepatic Neg liver ROS, GERD  Medicated and Controlled,  Endo/Other  diabetes, Type 2, Oral Hypoglycemic Agents  Renal/GU negative Renal ROS     Musculoskeletal   Abdominal   Peds  Hematology   Anesthesia Other Findings   Reproductive/Obstetrics                             Anesthesia Physical Anesthesia Plan  ASA: III  Anesthesia Plan: General   Post-op Pain Management: GA combined w/ Regional for post-op pain   Induction:   PONV Risk Score and Plan: 1 and Ondansetron  Airway Management Planned: Oral ETT  Additional Equipment:   Intra-op Plan:   Post-operative Plan:   Informed Consent: I have reviewed the patients History and Physical, chart, labs and discussed the procedure including the risks, benefits and alternatives for the proposed anesthesia with the patient or authorized representative who has indicated his/her understanding and acceptance.     Plan Discussed with:   Anesthesia Plan Comments:         Anesthesia Quick Evaluation

## 2017-04-19 NOTE — H&P (Signed)
Paper H&P to be scanned into permanent record. H&P reviewed. No significant changes noted.  

## 2017-04-19 NOTE — Discharge Instructions (Signed)
Post-Op Instructions - Rotator Cuff Repair  1. Bracing: You will wear a shoulder immobilizer or sling for 6 weeks.   2. Driving: No driving for 3 weeks post-op. When driving, do not wear the immobilizer. Ideally, we recommend no driving for 6 weeks while sling is in place as one arm will be immobilized.   3. Activity: No active lifting for 2 months. Wrist, hand, and elbow motion only. Avoid lifting the upper arm away from the body except for hygiene. You are permitted to bend and straighten the elbow passively only (no active elbow motion). You may use your hand and wrist for typing, writing, and managing utensils (cutting food). Do not lift more than a coffee cup for 8 weeks.  When sleeping or resting, inclined positions (recliner chair or wedge pillow) and a pillow under the forearm for support may provide better comfort for up to 4 weeks.  Avoid long distance travel for 4 weeks.  Return to normal activities after rotator cuff repair repair normally takes 6 months on average. If rehab goes very well, may be able to do most activities at 4 months, except overhead or contact sports.  4. Physical Therapy: Begins 3-4 days after surgery, and proceed 1 time per week for the first 6 weeks, then 1-2 times per week from weeks 6-20 post-op.  5. Medications:  - You will be provided a prescription for narcotic pain medicine. After surgery, take 1-2 narcotic tablets every 4 hours if needed for severe pain.  - A prescription for anti-nausea medication will be provided in case the narcotic medicine causes nausea - take 1 tablet every 6 hours only if nauseated.   - Take tylenol 1000 mg (2 Extra Strength tablets or 3 regular strength) every 8 hours for pain.  May decrease or stop tylenol 5 days after surgery if you are having minimal pain. - Take ASA 325mg/day x 2 weeks to help prevent DVTs/PEs (blood clots).  - DO NOT take ANY nonsteroidal anti-inflammatory pain medications (Advil, Motrin, Ibuprofen, Aleve,  Naproxen, or Naprosyn). These medicines can inhibit healing of your shoulder repair.    If you are taking prescription medication for anxiety, depression, insomnia, muscle spasm, chronic pain, or for attention deficit disorder, you are advised that you are at a higher risk of adverse effects with use of narcotics post-op, including narcotic addiction/dependence, depressed breathing, death. If you use non-prescribed substances: alcohol, marijuana, cocaine, heroin, methamphetamines, etc., you are at a higher risk of adverse effects with use of narcotics post-op, including narcotic addiction/dependence, depressed breathing, death. You are advised that taking > 50 morphine milligram equivalents (MME) of narcotic pain medication per day results in twice the risk of overdose or death. For your prescription provided: oxycodone 5 mg - taking more than 6 tablets per day would result in > 50 morphine milligram equivalents (MME) of narcotic pain medication. Be advised that we will prescribe narcotics short-term, for acute post-operative pain only - 3 weeks for major operations such as shoulder repair/reconstruction surgeries.     6. Post-Op Appointment:  Your first post-op appointment will be 10-14 days post-op.  7. Work or School: For most, but not all procedures, we advise staying out of work or school for at least 1 to 2 weeks in order to recover from the stress of surgery and to allow time for healing.   If you need a work or school note this can be provided.   8. Smoking: If you are a smoker, you need to refrain from   smoking in the postoperative period. The nicotine in cigarettes will inhibit healing of your shoulder repair and decrease the chance of successful repair. Similarly, nicotine containing products (gum, patches) should be avoided.   Post-operative Brace: Apply and remove the brace you received as you were instructed to at the time of fitting and as described in detail as the braces  instructions for use indicate.  Wear the brace for the period of time prescribed by your physician.  The brace can be cleaned with soap and water and allowed to air dry only.  Should the brace result in increased pain, decreased feeling (numbness/tingling), increased swelling or an overall worsening of your medical condition, please contact your doctor immediately.  If an emergency situation occurs as a result of wearing the brace after normal business hours, please dial 911 and seek immediate medical attention.  Let your doctor know if you have any further questions about the brace issued to you. Refer to the shoulder sling instructions for use if you have any questions regarding the correct fit of your shoulder sling.  [facility] for [facility]oubleshooting: [id number]  Video that illustrates how to properly use a shoulder sling: "Instructions for Proper Use of an Orthopaedic Sling" http://bass.com/https://www.youtube.com/watch?v=AHZpn_Xo45w      Interscalene Nerve Block with Exparel  1.  For your surgery you have received an Interscalene Nerve Block with Exparel. 2. Nerve Blocks affect many types of nerves, including nerves that control movement, pain and normal sensation.  You may experience feelings such as numbness, tingling, heaviness, weakness or the inability to move your arm or the feeling or sensation that your arm has "fallen asleep". 3. A nerve block with Exparel can last up to 5 days.  Usually the weakness wears off first.  The tingling and heaviness usually wear off next.  Finally you may start to notice pain.  Keep in mind that this may occur in any order.  Once a nerve block starts to wear off it is usually completely gone within 60 minutes. 4. ISNB may cause mild shortness of breath, a hoarse voice, blurry vision, unequal pupils, or drooping of the face on the same side as the nerve block.  These symptoms will usually resolve with the numbness.  Very rarely the procedure itself can cause mild  seizures. 5. If needed, your surgeon will give you a prescription for pain medication.  It will take about 60 minutes for the oral pain medication to become fully effective.  So, it is recommended that you start taking this medication before the nerve block first begins to wear off, or when you first begin to feel discomfort. 6. Take your pain medication only as prescribed.  Pain medication can cause sedation and decrease your breathing if you take more than you need for the level of pain that you have. 7. Nausea is a common side effect of many pain medications.  You may want to eat something before taking your pain medicine to prevent nausea. 8. After an Interscalene nerve block, you cannot feel pain, pressure or extremes in temperature in the effected arm.  Because your arm is numb it is at an increased risk for injury.  To decrease the possibility of injury, please practice the following:  a. While you are awake change the position of your arm frequently to prevent too much pressure on any one area for prolonged periods of time. b.  If you have a cast or tight dressing, check the color or your fingers every couple of  hours.  Call your surgeon with the appearance of any discoloration (white or blue). c. If you are given a sling to wear before you go home, please wear it  at all times until the block has completely worn off.  Do not get up at night without your sling. d. Please contact [facility] or your surgeon if you do not begin to regain sensation after 7 days from the surgery.  Anesthesia may be contacted by calling the Same Day Surgery Department, Mon. through Fri., 6 am to 4 pm at [phone].   e. If you experience any other problems or concerns, please contact your surgeon's office. f. If you experience severe or prolonged shortness of breath go to the nearest emergency department.

## 2017-04-24 ENCOUNTER — Ambulatory Visit: Payer: Self-pay | Admitting: Neurology

## 2017-04-24 ENCOUNTER — Encounter: Payer: Self-pay | Admitting: Neurology

## 2017-04-25 ENCOUNTER — Telehealth: Payer: Self-pay

## 2017-04-25 NOTE — Telephone Encounter (Signed)
Patient no show for appt on 04/24/2017. 

## 2017-05-08 ENCOUNTER — Encounter: Payer: Self-pay | Admitting: Neurology

## 2017-05-17 ENCOUNTER — Other Ambulatory Visit: Payer: Self-pay | Admitting: Family Medicine

## 2017-05-22 ENCOUNTER — Encounter: Payer: Self-pay | Admitting: Neurology

## 2017-05-22 ENCOUNTER — Ambulatory Visit (INDEPENDENT_AMBULATORY_CARE_PROVIDER_SITE_OTHER): Payer: Self-pay | Admitting: Neurology

## 2017-05-22 VITALS — BP 122/83 | HR 103 | Ht 70.0 in | Wt 161.6 lb

## 2017-05-22 DIAGNOSIS — M542 Cervicalgia: Secondary | ICD-10-CM

## 2017-05-22 DIAGNOSIS — I7774 Dissection of vertebral artery: Secondary | ICD-10-CM

## 2017-05-22 DIAGNOSIS — M25511 Pain in right shoulder: Secondary | ICD-10-CM

## 2017-05-22 DIAGNOSIS — R202 Paresthesia of skin: Secondary | ICD-10-CM

## 2017-05-22 DIAGNOSIS — M25512 Pain in left shoulder: Secondary | ICD-10-CM

## 2017-05-22 DIAGNOSIS — I63211 Cerebral infarction due to unspecified occlusion or stenosis of right vertebral arteries: Secondary | ICD-10-CM

## 2017-05-22 DIAGNOSIS — F17200 Nicotine dependence, unspecified, uncomplicated: Secondary | ICD-10-CM

## 2017-05-22 DIAGNOSIS — E785 Hyperlipidemia, unspecified: Secondary | ICD-10-CM

## 2017-05-22 DIAGNOSIS — R200 Anesthesia of skin: Secondary | ICD-10-CM

## 2017-05-22 MED ORDER — GABAPENTIN 300 MG PO CAPS: 300.0000 mg | ORAL_CAPSULE | Freq: Every day | ORAL | 3 refills | Status: DC

## 2017-05-22 NOTE — Progress Notes (Signed)
STROKE NEUROLOGY FOLLOW UP NOTE  NAME: Kenneth [name] DOB: 06/09/1965  REASON FOR VISIT: stroke follow up HISTORY FROM: Patient, wife and chart  Today we had the pleasure of seeing Kenneth [name] in follow-up at our [facility]rology [facility]. Pt was accompanied by wife.   History Summary 52 year old male with history of diabetes, hypertension, smoker had a stroke in 12/2015 with left upper extremity numbness, gait imbalance and left-sided coordination difficulty. MRI showed right BG/CR infarct. Carotid Doppler negative. EF 30-35%. He has been following with cardiology in [place] [facility]. TG 2631, A1c 9.5. He is not compliant with medication at home.  He was admitted on 02/20/17 for dizziness, nausea and vomiting. He admitted on and off neck pain for the last 1 week. CT head no hemorrhage. 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 MRI showed right cerebellum infarct and remote right CR infarct. EF 35-40%, not able to rule out LV thrombosis. Had 2D echo with contrast showed EF 45-50% and no mural thrombus. LDL 63, TG 253 and A1c 7.7. His TG much improved from 1 year ago. On 02/21/17 complained of severe headache with nausea vomiting. Repeat CT showed mild hydrocephalus. Put on 3% saline and transfer to ICU. NSG Dr. Arnoldo Morale contacted and no EVD needed at that time. Pt HA gradually improved and resolved. Repeat serial CT showed stable mild hydrocephalus.  Discharged with  aspirin, Plavix and Lipitor.    03/12/17 follow up - the patient initially doing well without headache.  However, on 03/05/17, he had ED visit for severe headache in the back of his neck and both shoulders.  Repeat CT head showed normal ventricle size, no acute abnormality. CTA head and neck no significant change with continued right VA proximal occlusion.  His headache and shoulder pain improved during ED visit, he was discharged from ED  without any specific treatment.  Over the last 1 week, he still has mild shoulder pain but bearable.  However, he woke up today with a severe bilateral shoulder pain, described as throbbing pain, difficulty with shoulder movement due to pain.  He went to see PCP today and referred here for further evaluation.  He denies any headache, dizziness, nausea vomiting, weakness, neck pain, bowel bladder dysfunction.  Interval History During the interval time, pt followed up with orthopedics. 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 Had right shoulder surgery on 04/19/17, currently undergoing rehab. He still not able to raise up right arm with right hand weakness, which is not out of proportion to his shoulder injury and seems slowly improving with PT/OT. However, he stated that his left arm also weak, not able to pick up 2 gallon milk bottle, at night very tingling in fingers and affects his sleep. He had right BG/CR stroke in 12/2015 which caused him intermittent left arm weakness or numbness but not as bad as this time. Denies leg weakness/numbness, or vision changes. BP today 122/83.  His labs last visit showed normal ESR and CRP as well as CK, but positive ANA.   REVIEW OF SYSTEMS: Full 14 system review of systems performed and notable only for those listed below and in HPI above, all others are negative:  Constitutional:   Cardiovascular:  Ear/Nose/Throat:   Skin:  Eyes:   Respiratory:   Gastroitestinal:   Genitourinary:  Hematology/Lymphatic:   Endocrine:  Musculoskeletal:  Allergy/Immunology:   Neurological:   Psychiatric:  Sleep:   The following represents the patient's updated allergies and side effects list: No Known Allergies  The neurologically relevant items on the patient's problem list were reviewed on today's visit.  Neurologic Examination  A problem focused neurological  exam (12 or more points of the single system neurologic examination, vital signs counts as 1 point, cranial nerves count for 8 points) was performed.  Blood pressure 122/83, pulse (!) 103, height '5\' 10"'  (1.778 m), weight 161 lb 9.6 oz (73.3 kg).  General - Well nourished, well developed, not in distress   Ophthalmologic - Fundi not visualized due to mild distress.  Cardiovascular - Regular rate and rhythm.  Mental Status -  Level of arousal and orientation to time, place, and person were intact.  Language including expression, naming, repetition, comprehension was assessed and found intact. Fund of Knowledge was assessed and was intact.  Cranial Nerves II - XII - II - Visual field intact OU. III, IV, VI - Extraocular movements intact. V - Facial sensation intact bilaterally. VII - Facial movement intact bilaterally. VIII - Hearing & vestibular intact bilaterally. X - Palate elevates symmetrically. XI - Chin turning & shoulder shrug intact bilaterally. XII - Tongue protrusion intact.  Motor Strength - The patient's strength was 0/5 R shoulder deltoid, 2+/5 bicep and 3/5 fingers, LUE proximal 4+/5 and distal 3/5.  Bulk was normal and fasciculations were absent.    Motor Tone - Muscle tone was assessed at the neck and appendages and was normal.  Reflexes - The patient's reflexes were 1+ in all extremities and he had no pathological reflexes.  Sensory - Light touch, temperature/pinprick were assessed and were normal.    Coordination - The patient had normal movements in the hands and feet with no ataxia or dysmetria.  Tremor was absent.  Gait and Station - The patient's transfers, posture, gait, station, and turns were observed as normal.   Data reviewed: I personally reviewed the images and agree with the radiology interpretations.  [name] Contrast Result Date: 02/20/2017 IMPRESSION: 1. 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 The vessel appears to be reconstituted in the distal V2 segment. 2. Negative MRI appearance of the left vertebral artery and bilateral cervical carotid arteries. Electronically Signed   By: Genevie Ann M.D.   On: 02/20/2017 12:54   Mr Brain Wo Contrast  Result Date: 02/19/2017 IMPRESSION: 1. 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 2. No hydrocephalus. 3. Small remote right corona radiata infarct. Electronically Signed   By: Monte Fantasia M.D.   On: 02/19/2017 15:51   Ct Angio Head/ Neck W Or Wo Contrast Result Date: 02/19/2017  IMPRESSION: 1. The right vertebral artery is abnormal throughout its course. 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 2. Unfortunately the distal right vertebral artery is dominant. The left vertebral is normal but diminutive beyond the left PICA. Still, the vertebrobasilar junction, basilar artery, and other posterior circulation remain patent. 3. Stable CT appearance of the brain since 1156 hours today with no acute infarct evident. 4. Minimal carotid atherosclerosis, no carotid stenosis, and otherwise negative anterior circulation. Electronically Signed By: Genevie Ann M.D. On: 02/19/2017 13:51   Ct Head Wo Contrast Result Date: 02/19/2017 IMPRESSION: 1. No evidence of acute intracranial abnormality. 2. Chronic posterior right  internal capsule/basal ganglia infarct. Electronically Signed By: Logan Bores M.D. On: 02/19/2017 12:10   Echocardiogram:                                               Study Conclusions  - Left ventricle: The cavity size was normal. Systolic function was moderately reduced. The estimated ejection fraction was in the range of 35% to 40%. Images were inadequate for LV wall motion assessment and cannot visualize the apex adequately. Features are consistent  with a pseudonormal left ventricular filling pattern, with concomitant abnormal relaxation and increased filling pressure (grade 2 diastolic dysfunction). Doppler parameters are consistent with high ventricular filling pressure. - Aortic valve: Trileaflet; normal thickness, mildly calcified leaflets. - Mitral valve: There was trivial regurgitation. - Left atrium: The atrium was mildly dilated. - Pulmonary arteries: PA peak pressure: 34 mm Hg (S). - Pericardium, extracardiac: A trivial pericardial effusion was identified posterior to the heart. - Recommendations: Limited study with definity contrast to assess wall motion and rule out apical thrombus.  Echocardiogram Limited:       02-22-2017, 12:29:44            Study Conclusions - Technical notes: Definity to assess LV. - Left ventricle: Systolic function was mildly reduced. The estimated ejection fraction was in the range of 45% to 50%. Impressions:- LVEF 45-50% - no mural thrombus.  CT HEAD WITHOUT CONTRAST 02/21/2017 22:45 IMPRESSION: 1. Evolving RIGHT posterior-inferior cerebellar artery territory infarct with increasing edema and mass effect, no hemorrhagic conversion. 2. New mild obstructive hydrocephalus.  CT HEAD WITHOUT CONTRAST 02/22/2017 03:01 IMPRESSION: 1. Stable distribution of right PICA infarction. Stable associated mass effect with partial effacement of fourth ventricle and quadrigeminal plate cistern. Stable ventricle size with mild hydrocephalus. 2. No new acute intracranial abnormality identified.  Ct Head Wo Contrast 02/23/2017 IMPRESSION: 1. Unchanged right PICA infarct and associated posterior fossa mass effect. Unchanged mild hydrocephalus. 2. No new intracranial abnormality.   MRI shoulder  Right shoulder: 1. Full-thickness tear of the anterior fibers of the supraspinatus near its footplate. 2. Marked AC joint osteoarthritis with impression upon the myotendinous juncture of the  supraspinatus tendon. Associated subacromial and subdeltoid bursitis. Type 3 hook like acromial shape. 3. Linear signal undermining the anterior glenoid labrum is suspicious for possible labral tear though assessment is limited by lack of joint distention fluid.  Left shoulder: 1. Tendinosis of the supraspinatus and infraspinatus tendons without surfacing tear. 2. Moderate AC joint arthropathy impressing upon the myotendinous junction of the supraspinatus tendon. Trace subacromial bursitis. 3. Tiny focus of T2 hyperintensity along the anterior glenoid labrum could represent a tiny paralabral cyst, a secondary sign of possible labral tear but a discrete tear is not apparent given lack of joint distention.  Component     Latest Ref Rng & Units 02/19/2017 02/20/2017 02/21/2017  Cholesterol     0 - 200 mg/dL   141  Triglycerides     <150 mg/dL   253 (H)  HDL Cholesterol     >40 mg/dL   27 (L)  Total CHOL/HDL Ratio     RATIO   5.2  VLDL     0 - 40 mg/dL   51 (H)  LDL (calc)     0 - 99 mg/dL   63  Hemoglobin A1C     4.8 - 5.6 % 7.7 (H)  Mean Plasma Glucose     mg/dL 174.29    HIV Screen 4th Generation wRfx     Non Reactive  Non Reactive   TSH     0.350 - 4.500 uIU/mL  0.765    Component     Latest Ref Rng & Units 03/11/2017  Sed Rate     0 - 30 mm/hr 9  CRP     0.0 - 4.9 mg/L 0.5  Anit Nuclear Antibody(ANA)     Negative Positive (A)  CK Total     24 - 204 U/L 43    Assessment: As you may recall, he is a 52 y.o. Caucasian male with PMH of diabetes, hypertension, HLD, smoker, prior right BG/CR stroke in 12/2015 admitted on 02/20/17 for dizziness, nausea and vomiting. He admitted on and off neck pain for the last 1 week. CT head no hemorrhage. 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 MRI showed right cerebellum infarct and remote right CR infarct. EF 35-40%, not able to rule  out LV thrombosis. Had 2D echo with contrast showed EF 45-50% and no mural thrombus. LDL 63, TG 253 and A1c 7.7. His TG much improved from 1 year ago. Developed mild hydrocephalus. Put on 3% saline and no EVD needed at that time. Pt HA gradually improved and resolved. Repeat serial CT showed stable mild hydrocephalus.  Discharged with  aspirin, Plavix and Lipitor.    On 03/05/17, he had ED visit for severe headache in the back of his neck and both shoulders.  Repeat CT head showed normal ventricle size, no acute abnormality. CTA head and neck no significant change with continued right VA proximal occlusion. He woke up today with a severe bilateral shoulder throbbing pain, difficulty with shoulder movement due to pain. Tenderness with shoulder palpation bilaterally. ESR and CRP and CK negative, but ANA positive. Followed with orthopedics, MRI b/l shoulder showed severe right rotator cuff injury with marked  AC joint osteoarthritis and left shoulder moderate AC joint osteoarthritis. Had right shoulder surgery on 04/19/17, currently undergoing rehab. However, complains of left arm weakness and finger tingling.   Plan:  - continue plavix and lipitor for stroke prevention - stop ASA for now. - will add gabapentin 366m at night for left hand tingling numbness at bedtime.  - continue follow up with orthopedics for shoulder treatment - continue PT/OT for shoulder pain  - will do EMG/NCS - will do C-spine MRI to evaluate cervical spine - if above work up neg, and pt symptoms not improving, may consider to repeat ANA and do other autoimmune work up as well as rheumatology referral if needed.  - Follow up with your primary care physician for stroke risk factor modification. Recommend maintain blood pressure goal <130/80, diabetes with hemoglobin A1c goal below 7.0% and lipids with LDL cholesterol goal below 70 mg/dL.  - check BP and glucose at home and record - follow up in 3 months with JJanett Billow  I spent  more than 25 minutes of face to face time with the patient. Greater than 50% of time was spent in counseling and coordination of care. We discussed shoulder pain, add neurontin, MRI C-spine and EMG/NCS, follow-up with orthopedics.   Orders Placed This Encounter  Procedures  . MR CERVICAL SPINE WO CONTRAST    Standing Status:   Future    Standing Expiration Date:   07/23/2018    Order Specific Question:  What is the patient's sedation requirement?    Answer:   No Sedation    Order Specific Question:   Does the patient have a pacemaker or implanted devices?    Answer:   No    Order Specific Question:   Preferred imaging location?    Answer:   Internal    Order Specific Question:   Radiology Contrast Protocol - do NOT remove file path    Answer:   [email or web address]  . NCV with EMG(electromyography)    Standing Status:   Future    Standing Expiration Date:   05/23/2018    Meds ordered this encounter  Medications  . gabapentin (NEURONTIN) 300 MG capsule    Sig: Take 1 capsule (300 mg total) by mouth at bedtime.    Dispense:  30 capsule    Refill:  3    Patient Instructions  - continue plavix and lipitor for stroke prevention - stop ASA for now. - will add gabapentin 339m at night for left hand tingling numbness at bedtime.  - continue follow up with orthopedics for shoulder treatment - continue PT/OT for shoulder pain  - will do EMG/NCS - will do C-spine MRI to evaluate cervical spine - Follow up with your primary care physician for stroke risk factor modification. Recommend maintain blood pressure goal <130/80, diabetes with hemoglobin A1c goal below 7.0% and lipids with LDL cholesterol goal below 70 mg/dL.  - check BP at home and record - follow up in 3 months with JJanett Billow   JRosalin Hawking MD PhD [facility] [address] [place] [phone]

## 2017-05-22 NOTE — Patient Instructions (Addendum)
-   continue plavix and lipitor for stroke prevention - stop ASA for now. - will add gabapentin  at night for left hand tingling numbness at bedtime.  - continue follow up with orthopedics for shoulder treatment - continue PT/OT for shoulder pain  - will do EMG/NCS - will do C-spine MRI to evaluate cervical spine - Follow up with your primary care physician for stroke risk factor modification. Recommend maintain blood pressure goal <130/80, diabetes with hemoglobin A1c goal below 7.0% and lipids with LDL cholesterol goal below 70 mg/dL.  - check BP at home and record - follow up in 3 months with Shanda Bumps.

## 2017-05-23 DIAGNOSIS — M542 Cervicalgia: Secondary | ICD-10-CM | POA: Insufficient documentation

## 2017-05-23 DIAGNOSIS — R200 Anesthesia of skin: Secondary | ICD-10-CM | POA: Insufficient documentation

## 2017-05-23 DIAGNOSIS — R202 Paresthesia of skin: Secondary | ICD-10-CM

## 2017-06-24 ENCOUNTER — Ambulatory Visit (INDEPENDENT_AMBULATORY_CARE_PROVIDER_SITE_OTHER): Payer: Self-pay | Admitting: Neurology

## 2017-06-24 ENCOUNTER — Encounter: Payer: Self-pay | Admitting: Neurology

## 2017-06-24 ENCOUNTER — Ambulatory Visit: Payer: Self-pay | Admitting: Neurology

## 2017-06-24 DIAGNOSIS — R200 Anesthesia of skin: Secondary | ICD-10-CM

## 2017-06-24 DIAGNOSIS — R202 Paresthesia of skin: Secondary | ICD-10-CM

## 2017-06-24 DIAGNOSIS — I639 Cerebral infarction, unspecified: Secondary | ICD-10-CM

## 2017-06-24 NOTE — Progress Notes (Signed)
Please refer to EMG and nerve conduction study procedure note. 

## 2017-06-24 NOTE — Procedures (Signed)
     HISTORY:  Kenneth GranaJimmy Sapien is a 52 year old gentleman with a history of prior stroke with left-sided symptoms.  Since the stroke he has had numbness and tingling sensations involving the arm and the leg on the left.  He is not sleeping well secondary to this.  The patient is being evaluated for a possible neuropathy.  NERVE CONDUCTION STUDIES:  Nerve conduction studies were performed on both upper extremities.  The distal motor latencies and motor amplitudes for the median and ulnar nerves were normal bilaterally.  The nerve conduction velocities for these nerves were normal bilaterally.  The sensory latencies for the radial, median, and ulnar nerves were within normal limits bilaterally with exception of the very mild sensory prolongation for the right median nerve.  The F-wave latencies for the ulnar nerves were normal bilaterally.  EMG STUDIES:  EMG study was performed on the left upper extremity.  The APB muscle revealed 2-3 K units with full recruitment, no fibrillations or positive waves were noted. The first dorsal interosseous muscle reveals 2 to 4K units with full recruitment, no fibrillations or positive waves were noted.  The patient refused further EMG evaluation.  IMPRESSION:  Nerve conduction studies done on both upper extremities were relatively unremarkable, no evidence of a neuropathy is seen.  EMG evaluation of the left upper extremity was limited secondary to patient refusal, the limited study done was normal.  C. Lesia SagoKeith Willis MD 06/24/2017 3:55 PM  [facility] [address] [place], [place] [id number]  Phone [phone] Fax [phone]

## 2017-06-24 NOTE — Progress Notes (Signed)
[facility]    Nerve / Sites Muscle Latency Ref. Amplitude Ref. Rel Amp Segments Distance Velocity Ref. Area    ms ms mV mV %  cm m/s m/s mVms  R Median - APB     Wrist APB 3.9 ?4.4 8.8 ?4.0 100 Wrist - APB 7   36.4     Upper arm APB 8.3  8.7  98.2 Upper arm - Wrist 24 54 ?49 35.6  L Median - APB     Wrist APB 3.5 ?4.4 8.2 ?4.0 100 Wrist - APB 7   33.0     Upper arm APB 8.1  8.1  98.6 Upper arm - Wrist 24 53 ?49 33.9  R Ulnar - ADM     Wrist ADM 2.7 ?3.3 9.7 ?6.0 100 Wrist - ADM 7   27.5     B.Elbow ADM 6.9  9.7  100 B.Elbow - Wrist 21 50 ?49 27.3     A.Elbow ADM 9.2  9.1  94.2 A.Elbow - B.Elbow 12 52 ?49 26.7         A.Elbow - Wrist      L Ulnar - ADM     Wrist ADM 3.0 ?3.3 9.7 ?6.0 100 Wrist - ADM 7   29.7     B.Elbow ADM 7.1  9.3  95.5 B.Elbow - Wrist 21 51 ?49 30.5     A.Elbow ADM 9.3  9.0  97.1 A.Elbow - B.Elbow 12 55 ?49 28.9         A.Elbow - Wrist                 [facility]    Nerve / Sites Rec. Site Peak Lat Ref.  Amp Ref. Segments Distance Peak Diff Ref.    ms ms V V  cm ms ms  R Radial - Anatomical snuff box (Forearm)     Forearm Wrist 2.4 ?2.9 18 ?15 Forearm - Wrist 10    L Radial - Anatomical snuff box (Forearm)     Forearm Wrist 2.4 ?2.9 19 ?15 Forearm - Wrist 10    R Median, Ulnar - Transcarpal comparison     Median Palm Wrist 2.5 ?2.2 49 ?35 Median Palm - Wrist 8       Ulnar Palm Wrist 2.0 ?2.2 11 ?12 Ulnar Palm - Wrist 8          Median Palm - Ulnar Palm  0.5 ?0.4  L Median, Ulnar - Transcarpal comparison     Median Palm Wrist 2.2 ?2.2 75 ?35 Median Palm - Wrist 8       Ulnar Palm Wrist 2.0 ?2.2 9 ?12 Ulnar Palm - Wrist 8          Median Palm - Ulnar Palm  0.2 ?0.4  R Median - Orthodromic (Dig II, Mid palm)     Dig II Wrist 3.5 ?3.4 11 ?10 Dig II - Wrist 13    L Median - Orthodromic (Dig II, Mid palm)     Dig II Wrist 3.3 ?3.4 14 ?10 Dig II - Wrist 13    R Ulnar - Orthodromic, (Dig V, Mid palm)     Dig V Wrist 2.6 ?3.1 7 ?5 Dig V - Wrist 2111                     F   Wave    Nerve F Lat Ref.   ms ms  R Ulnar - ADM 30.1 ?32.0  L Ulnar - ADM 30.3 ?  32.0         EMG full

## 2017-06-25 ENCOUNTER — Telehealth: Payer: Self-pay

## 2017-06-25 NOTE — Telephone Encounter (Signed)
Notes recorded by Hildred AlaminMurrell, Elizabeth Haff Y, RN on 06/25/2017 at 3:28 PM EDT Left vm for patient to call back about EMG results. ------

## 2017-06-25 NOTE — Telephone Encounter (Signed)
-----   Message from Marvel PlanJindong Xu, MD sent at 06/25/2017  6:30 AM EDT ----- Could you please let the patient know that the nerve conduction test done recently in our office was unremarkable. Please continue current treatment. Thanks.  Marvel PlanJindong Xu, MD PhD Stroke Neurology 06/25/2017 6:30 AM

## 2017-07-03 NOTE — Telephone Encounter (Signed)
Notes recorded by Hildred AlaminMurrell, Merridy Pascoe Y, RN on 07/03/2017 at 2:08 PM EDT  11:42 AM  Note    Left vm for patient to call back about EMG results.

## 2017-07-03 NOTE — Telephone Encounter (Signed)
Left vm for patient to call back about EMG results. ------ 

## 2017-07-04 NOTE — Telephone Encounter (Signed)
Sent patient mychart message concerning EMG/nerve conduction study test results today. LEft two vm.

## 2017-08-21 ENCOUNTER — Encounter: Payer: Self-pay | Admitting: *Deleted

## 2017-08-21 DIAGNOSIS — Z006 Encounter for examination for normal comparison and control in clinical research program: Secondary | ICD-10-CM

## 2017-08-21 NOTE — Progress Notes (Addendum)
STROKE~AF Research study month 6 telephone follow up completed. Patient states he is doing well except his shoulder. Denies hospitalizations and blood work but not any other test. He stop his ASA 05/22/17 as instructed but 2 months ago started taking 4 baby ASA. He is not sure who restarted the ASA. He has returned to pre stroke baseline activites and has no limitations. He rates his health 85% today. Modified Rankin Score is 0 based on assessment.EQ-5D questionnaire completed. Next research required telephone follow up is due between 06/feb/2020-02/apr/2020.

## 2017-08-22 ENCOUNTER — Telehealth: Payer: Self-pay

## 2017-08-22 ENCOUNTER — Ambulatory Visit: Payer: Self-pay | Admitting: Adult Health

## 2017-08-22 NOTE — Telephone Encounter (Signed)
Patient no show for appt today. 

## 2017-08-22 NOTE — Progress Notes (Deleted)
STROKE NEUROLOGY FOLLOW UP NOTE  NAME: Kenneth [name] DOB: 1965/09/07  REASON FOR VISIT: stroke follow up HISTORY FROM: Patient, wife and chart  Today we had the pleasure of seeing Kenneth [name] in follow-up at our [facility]. Pt was accompanied by wife.   History Summary 52 year old male with history of diabetes, hypertension, smoker had a stroke in 12/2015 with left upper extremity numbness, gait imbalance and left-sided coordination difficulty. MRI showed right BG/CR infarct. Carotid Doppler negative. EF 30-35%. He has been following with cardiology in [place] clinic. TG 2631, A1c 9.5. He is not compliant with medication at home.  He was admitted on 02/20/17 for dizziness, nausea and vomiting. He admitted on and off neck pain for the last 1 week. CT head no hemorrhage. 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 MRI showed right cerebellum infarct and remote right CR infarct. EF 35-40%, not able to rule out LV thrombosis. Had 2D echo with contrast showed EF 45-50% and no mural thrombus. LDL 63, TG 253 and A1c 7.7. His TG much improved from 1 year ago. On 02/21/17 complained of severe headache with nausea vomiting. Repeat CT showed mild hydrocephalus. Put on 3% saline and transfer to ICU. NSG Dr. Arnoldo Morale contacted and no EVD needed at that time. Pt HA gradually improved and resolved. Repeat serial CT showed stable mild hydrocephalus.  Discharged with  aspirin, Plavix and Lipitor.    03/12/17 follow up - the patient initially doing well without headache.  However, on 03/05/17, he had ED visit for severe headache in the back of his neck and both shoulders.  Repeat CT head showed normal ventricle size, no acute abnormality. CTA head and neck no significant change with continued right VA proximal occlusion.  His headache and shoulder pain improved during ED visit, he was discharged from ED  without any specific treatment.  Over the last 1 week, he still has mild shoulder pain but bearable.  However, he woke up today with a severe bilateral shoulder pain, described as throbbing pain, difficulty with shoulder movement due to pain.  He went to see PCP today and referred here for further evaluation.  He denies any headache, dizziness, nausea vomiting, weakness, neck pain, bowel bladder dysfunction.  05/22/17 follow up [place]: During the interval time, pt followed up with orthopedics. 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 Had right shoulder surgery on 04/19/17, currently undergoing rehab. He still not able to raise up right arm with right hand weakness, which is not out of proportion to his shoulder injury and seems slowly improving with PT/OT. However, he stated that his left arm also weak, not able to pick up 2 gallon milk bottle, at night very tingling in fingers and affects his sleep. He had right BG/CR stroke in 12/2015 which caused him intermittent left arm weakness or numbness but not as bad as this time. Denies leg weakness/numbness, or vision changes. BP today 122/83. His labs last visit showed normal ESR and CRP as well as CK, but positive ANA.   Interval History:  Since previous visit, patient underwent right arthroscopic rotator cuff repair, distal clavicle excision, and subacromial decompression with biceps tenotomy on 05/29/2017.    REVIEW OF SYSTEMS: Full 14 system review of systems performed and notable only for those listed below and in HPI above, all others are  negative:  Constitutional:   Cardiovascular:  Ear/Nose/Throat:   Skin:  Eyes:   Respiratory:   Gastroitestinal:   Genitourinary:  Hematology/Lymphatic:   Endocrine:  Musculoskeletal:  Allergy/Immunology:   Neurological:   Psychiatric:  Sleep:   The following represents the patient's updated allergies and  side effects list: No Known Allergies  The neurologically relevant items on the patient's problem list were reviewed on today's visit.  Neurologic Examination  A problem focused neurological exam (12 or more points of the single system neurologic examination, vital signs counts as 1 point, cranial nerves count for 8 points) was performed.  There were no vitals taken for this visit.  General - Well nourished, well developed, not in distress   Ophthalmologic - Fundi not visualized due to mild distress.  Cardiovascular - Regular rate and rhythm.  Mental Status -  Level of arousal and orientation to time, place, and person were intact.  Language including expression, naming, repetition, comprehension was assessed and found intact. Fund of Knowledge was assessed and was intact.  Cranial Nerves II - XII - II - Visual field intact OU. III, IV, VI - Extraocular movements intact. V - Facial sensation intact bilaterally. VII - Facial movement intact bilaterally. VIII - Hearing & vestibular intact bilaterally. X - Palate elevates symmetrically. XI - Chin turning & shoulder shrug intact bilaterally. XII - Tongue protrusion intact.  Motor Strength - The patient's strength was 0/5 R shoulder deltoid, 2+/5 bicep and 3/5 fingers, LUE proximal 4+/5 and distal 3/5.  Bulk was normal and fasciculations were absent.    Motor Tone - Muscle tone was assessed at the neck and appendages and was normal.  Reflexes - The patient's reflexes were 1+ in all extremities and he had no pathological reflexes.  Sensory - Light touch, temperature/pinprick were assessed and were normal.    Coordination - The patient had normal movements in the hands and feet with no ataxia or dysmetria.  Tremor was absent.  Gait and Station - The patient's transfers, posture, gait, station, and turns were observed as normal.   Data reviewed: I personally reviewed the images and agree with the radiology interpretations.  Mr  Jodene Nam Neck W Wo Contrast Result Date: 02/20/2017 IMPRESSION: 1. 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 The vessel appears to be reconstituted in the distal V2 segment. 2. Negative MRI appearance of the left vertebral artery and bilateral cervical carotid arteries. Electronically Signed   By: Genevie Ann M.D.   On: 02/20/2017 12:54   Mr Brain Wo Contrast  Result Date: 02/19/2017 IMPRESSION: 1. 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 2. No hydrocephalus. 3. Small remote right corona radiata infarct. Electronically Signed   By: Monte Fantasia M.D.   On: 02/19/2017 15:51   Ct Angio Head/ Neck W Or Wo Contrast Result Date: 02/19/2017  IMPRESSION: 1. The right vertebral artery is abnormal throughout its course. 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 2. Unfortunately the distal right vertebral artery is dominant. The left vertebral is normal but diminutive beyond the left PICA. Still, the vertebrobasilar junction, basilar artery, and other posterior circulation remain patent. 3. Stable CT appearance of the brain since 1156 hours today with no acute infarct evident. 4. Minimal carotid atherosclerosis, no carotid stenosis, and otherwise negative anterior circulation. Electronically Signed By: Genevie Ann M.D. On: 02/19/2017 13:51   Ct  Head Wo Contrast Result Date: 02/19/2017 IMPRESSION: 1. No evidence of acute intracranial abnormality. 2. 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 Electronically Signed By: Logan Bores M.D. On: 02/19/2017 12:10   Echocardiogram:                                               Study Conclusions  - Left ventricle: The cavity size was normal. Systolic function was moderately reduced. The estimated ejection fraction was in  the range of 35% to 40%. Images were inadequate for LV wall motion assessment and cannot visualize the apex adequately. Features are consistent with a pseudonormal left ventricular filling pattern, with concomitant abnormal relaxation and increased filling pressure (grade 2 diastolic dysfunction). Doppler parameters are consistent with high ventricular filling pressure. - Aortic valve: Trileaflet; normal thickness, mildly calcified leaflets. - Mitral valve: There was trivial regurgitation. - Left atrium: The atrium was mildly dilated. - Pulmonary arteries: PA peak pressure: 34 mm Hg (S). - Pericardium, extracardiac: A trivial pericardial effusion was identified posterior to the heart. - Recommendations: Limited study with definity contrast to assess wall motion and rule out apical thrombus.  Echocardiogram Limited:       02-22-2017, 12:29:44            Study Conclusions - Technical notes: Definity to assess LV. - Left ventricle: Systolic function was mildly reduced. The estimated ejection fraction was in the range of 45% to 50%. Impressions:- LVEF 45-50% - no mural thrombus.  CT HEAD WITHOUT CONTRAST 02/21/2017 22:45 IMPRESSION: 1. Evolving RIGHT posterior-inferior cerebellar artery territory infarct with increasing edema and mass effect, no hemorrhagic conversion. 2. New mild obstructive hydrocephalus.  CT HEAD WITHOUT CONTRAST 02/22/2017 03:01 IMPRESSION: 1. Stable distribution of right PICA infarction. Stable associated mass effect with partial effacement of fourth ventricle and quadrigeminal plate cistern. Stable ventricle size with mild hydrocephalus. 2. No new acute intracranial abnormality identified.  Ct Head Wo Contrast 02/23/2017 IMPRESSION: 1. Unchanged right PICA infarct and associated posterior fossa mass effect. Unchanged mild hydrocephalus. 2. No new intracranial abnormality.   MRI shoulder  Right shoulder: 1. Full-thickness tear of  the anterior fibers of the supraspinatus near its footplate. 2. Marked AC joint osteoarthritis with impression upon the myotendinous juncture of the supraspinatus tendon. Associated subacromial and subdeltoid bursitis. Type 3 hook like acromial shape. 3. Linear signal undermining the anterior glenoid labrum is suspicious for possible labral tear though assessment is limited by lack of joint distention fluid.  Left shoulder: 1. Tendinosis of the supraspinatus and infraspinatus tendons without surfacing tear. 2. Moderate AC joint arthropathy impressing upon the myotendinous junction of the supraspinatus tendon. Trace subacromial bursitis. 3. Tiny focus of T2 hyperintensity along the anterior glenoid labrum could represent a tiny paralabral cyst, a secondary sign of possible labral tear but a discrete tear is not apparent given lack of joint distention.  Component     Latest Ref Rng & Units 02/19/2017 02/20/2017 02/21/2017  Cholesterol     0 - 200 mg/dL   141  Triglycerides     <150 mg/dL   253 (H)  HDL Cholesterol     >40 mg/dL   27 (L)  Total CHOL/HDL Ratio     RATIO   5.2  VLDL     0 - 40 mg/dL   51 (H)  LDL (calc)     0 - 99 mg/dL  63  Hemoglobin A1C     4.8 - 5.6 % 7.7 (H)    Mean Plasma Glucose     mg/dL 174.29    HIV Screen 4th Generation wRfx     Non Reactive  Non Reactive   TSH     0.350 - 4.500 uIU/mL  0.765    Component     Latest Ref Rng & Units 03/11/2017  Sed Rate     0 - 30 mm/hr 9  CRP     0.0 - 4.9 mg/L 0.5  Anit Nuclear Antibody(ANA)     Negative Positive (A)  CK Total     24 - 204 U/L 43    Assessment: As you may recall, he is a 52 y.o. Caucasian male with PMH of diabetes, hypertension, HLD, smoker, prior right BG/CR stroke in 12/2015 admitted on 02/20/17 for dizziness, nausea and vomiting. He admitted on and off neck pain for the last 1 week. CT head no hemorrhage. 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 MRI showed right cerebellum infarct and remote right CR infarct. EF 35-40%, not able to rule out LV thrombosis. Had 2D echo with contrast showed EF 45-50% and no mural thrombus. LDL 63, TG 253 and A1c 7.7. His TG much improved from 1 year ago. Developed mild hydrocephalus. Put on 3% saline and no EVD needed at that time. Pt HA gradually improved and resolved. Repeat serial CT showed stable mild hydrocephalus.  Discharged with  aspirin, Plavix and Lipitor.    On 03/05/17, he had ED visit for severe headache in the back of his neck and both shoulders.  Repeat CT head showed normal ventricle size, no acute abnormality. CTA head and neck no significant change with continued right VA proximal occlusion. He woke up today with a severe bilateral shoulder throbbing pain, difficulty with shoulder movement due to pain. Tenderness with shoulder palpation bilaterally. ESR and CRP and CK negative, but ANA positive. Followed with orthopedics, MRI b/l shoulder showed severe right rotator cuff injury with marked  AC joint osteoarthritis and left shoulder moderate AC joint osteoarthritis. Had right shoulder surgery on 04/19/17, currently undergoing rehab. However, complains of left arm weakness and finger tingling.   Plan:  - continue plavix and lipitor for stroke prevention - stop ASA for now. - will add gabapentin 372m at night for left hand tingling numbness at bedtime.  - continue follow up with orthopedics for shoulder treatment - continue PT/OT for shoulder pain  - will do EMG/NCS - will do C-spine MRI to evaluate cervical spine - if above work up neg, and pt symptoms not improving, may consider to repeat ANA and do other autoimmune work up as well as rheumatology referral if needed.  - Follow up with your primary care physician for stroke risk factor modification. Recommend maintain blood pressure goal <130/80, diabetes with hemoglobin A1c goal below  7.0% and lipids with LDL cholesterol goal below 70 mg/dL.  - check BP and glucose at home and record - follow up in 3 months with JJanett Billow  I spent more than 25 minutes of face to face time with the patient. Greater than 50% of time was spent in counseling and coordination of care. We discussed shoulder pain, add neurontin, MRI C-spine and EMG/NCS, follow-up with orthopedics.  JVenancio Poisson AGNP-BC  [facility] [address] [address] Phone [phone] [phone]: This  document was prepared with digital dictation and possible smart phrase technology. Any transcriptional errors that result from this process are unintentional.

## 2017-08-26 ENCOUNTER — Encounter: Payer: Self-pay | Admitting: Adult Health

## 2017-09-16 NOTE — Progress Notes (Deleted)
STROKE NEUROLOGY FOLLOW UP NOTE  NAME: Kenneth [name] DOB: [birth year]  REASON FOR VISIT: stroke follow up HISTORY FROM: Patient, wife and chart  Today we had the pleasure of seeing Kenneth [name] in follow-up at our [facility]rology [facility]. Pt was accompanied by wife.   History Summary 52 year old male with history of diabetes, hypertension, smoker had a stroke in 12/2015 with left upper extremity numbness, gait imbalance and left-sided coordination difficulty. MRI showed right BG/CR infarct. Carotid Doppler negative. EF 30-35%. He has been following with cardiology in [facility]. TG 2631, A1c 9.5. He is not compliant with medication at home.  He was admitted on 02/20/17 for dizziness, nausea and vomiting. He admitted on and off neck pain for the last 1 week. CT head no hemorrhage. 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 MRI showed right cerebellum infarct and remote right CR infarct. EF 35-40%, not able to rule out LV thrombosis. Had 2D echo with contrast showed EF 45-50% and no mural thrombus. LDL 63, TG 253 and A1c 7.7. His TG much improved from 1 year ago. On 02/21/17 complained of severe headache with nausea vomiting. Repeat CT showed mild hydrocephalus. Put on 3% saline and transfer to ICU. NSG Dr. Arnoldo Morale contacted and no EVD needed at that time. Pt HA gradually improved and resolved. Repeat serial CT showed stable mild hydrocephalus.  Discharged with  aspirin, Plavix and Lipitor.    03/12/17 follow up Dutch Quint - the patient initially doing well without headache.  However, on 03/05/17, he had ED visit for severe headache in the back of his neck and both shoulders.  Repeat CT head showed normal ventricle size, no acute abnormality. CTA head and neck no significant change with continued right VA proximal occlusion.  His headache and shoulder pain improved during ED visit, he was discharged from ED  without any specific treatment.  Over the last 1 week, he still has mild shoulder pain but bearable.  However, he woke up today with a severe bilateral shoulder pain, described as throbbing pain, difficulty with shoulder movement due to pain.  He went to see PCP today and referred here for further evaluation.  He denies any headache, dizziness, nausea vomiting, weakness, neck pain, bowel bladder dysfunction.  05/22/2017 visit JX: During the interval time, pt followed up with orthopedics. 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 Had right shoulder surgery on 04/19/17, currently undergoing rehab. He still not able to raise up right arm with right hand weakness, which is not out of proportion to his shoulder injury and seems slowly improving with PT/OT. However, he stated that his left arm also weak, not able to pick up 2 gallon milk bottle, at night very tingling in fingers and affects his sleep. He had right BG/CR stroke in 12/2015 which caused him intermittent left arm weakness or numbness but not as bad as this time. Denies leg weakness/numbness, or vision changes. BP today 122/83. His labs last visit showed normal ESR and CRP as well as CK, but positive ANA.   Interval history 09/16/2017:     REVIEW OF SYSTEMS: Full 14 system review of systems performed and notable only for those listed below and in HPI above, all others are negative:  Constitutional:   Cardiovascular:  Ear/Nose/Throat:   Skin:  Eyes:   Respiratory:   Gastroitestinal:  Genitourinary:  Hematology/Lymphatic:   Endocrine:  Musculoskeletal:  Allergy/Immunology:   Neurological:   Psychiatric:  Sleep:   The following represents the patient's updated allergies and side effects list: No Known Allergies  The neurologically relevant items on the patient's problem list were reviewed on today's visit.  Neurologic  Examination  A problem focused neurological exam (12 or more points of the single system neurologic examination, vital signs counts as 1 point, cranial nerves count for 8 points) was performed.  There were no vitals taken for this visit.  General - Well nourished, well developed, not in distress   Ophthalmologic - Fundi not visualized due to mild distress.  Cardiovascular - Regular rate and rhythm.  Mental Status -  Level of arousal and orientation to time, place, and person were intact.  Language including expression, naming, repetition, comprehension was assessed and found intact. Fund of Knowledge was assessed and was intact.  Cranial Nerves II - XII - II - Visual field intact OU. III, IV, VI - Extraocular movements intact. V - Facial sensation intact bilaterally. VII - Facial movement intact bilaterally. VIII - Hearing & vestibular intact bilaterally. X - Palate elevates symmetrically. XI - Chin turning & shoulder shrug intact bilaterally. XII - Tongue protrusion intact.  Motor Strength - The patient's strength was 0/5 R shoulder deltoid, 2+/5 bicep and 3/5 fingers, LUE proximal 4+/5 and distal 3/5.  Bulk was normal and fasciculations were absent.    Motor Tone - Muscle tone was assessed at the neck and appendages and was normal.  Reflexes - The patient's reflexes were 1+ in all extremities and he had no pathological reflexes.  Sensory - Light touch, temperature/pinprick were assessed and were normal.    Coordination - The patient had normal movements in the hands and feet with no ataxia or dysmetria.  Tremor was absent.  Gait and Station - The patient's transfers, posture, gait, station, and turns were observed as normal.   Data reviewed: I personally reviewed the images and agree with the radiology interpretations.  [name] Contrast Result Date: 02/20/2017 IMPRESSION: 1. Abnormal MRA appearance of the right vertebral artery compatible with proximal vertebral  artery dissection and thrombosis. The vessel appears to be reconstituted in the distal V2 segment. 2. Negative MRI appearance of the left vertebral artery and bilateral cervical carotid arteries. Electronically Signed   By: Genevie Ann M.D.   On: 02/20/2017 12:54   Mr Brain Wo Contrast  Result Date: 02/19/2017 IMPRESSION: 1. 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 2. No hydrocephalus. 3. Small remote right corona radiata infarct. Electronically Signed   By: Monte Fantasia M.D.   On: 02/19/2017 15:51   Ct Angio Head/ Neck W Or Wo Contrast Result Date: 02/19/2017  IMPRESSION: 1. The right vertebral artery is abnormal throughout its course. 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 2. Unfortunately the distal right vertebral artery is dominant. The left vertebral is normal but diminutive beyond the left PICA. Still, the vertebrobasilar junction, basilar artery, and other posterior circulation remain patent. 3. Stable CT appearance of the brain since 1156 hours today with no acute infarct evident. 4. Minimal carotid atherosclerosis, no carotid stenosis, and otherwise negative anterior circulation. Electronically Signed By: Genevie Ann M.D. On: 02/19/2017 13:51   Ct Head Wo Contrast Result Date: 02/19/2017 IMPRESSION: 1. No evidence of acute intracranial abnormality. 2. Chronic posterior right internal capsule/basal ganglia  infarct. Electronically Signed By: Logan Bores M.D. On: 02/19/2017 12:10   Echocardiogram:                                               Study Conclusions  - Left ventricle: The cavity size was normal. Systolic function was moderately reduced. The estimated ejection fraction was in the range of 35% to 40%. Images were inadequate for LV wall motion assessment and cannot visualize the apex adequately. Features are consistent with a  pseudonormal left ventricular filling pattern, with concomitant abnormal relaxation and increased filling pressure (grade 2 diastolic dysfunction). Doppler parameters are consistent with high ventricular filling pressure. - Aortic valve: Trileaflet; normal thickness, mildly calcified leaflets. - Mitral valve: There was trivial regurgitation. - Left atrium: The atrium was mildly dilated. - Pulmonary arteries: PA peak pressure: 34 mm Hg (S). - Pericardium, extracardiac: A trivial pericardial effusion was identified posterior to the heart. - Recommendations: Limited study with definity contrast to assess wall motion and rule out apical thrombus.  Echocardiogram Limited:       02-22-2017, 12:29:44            Study Conclusions - Technical notes: Definity to assess LV. - Left ventricle: Systolic function was mildly reduced. The estimated ejection fraction was in the range of 45% to 50%. Impressions:- LVEF 45-50% - no mural thrombus.  CT HEAD WITHOUT CONTRAST 02/21/2017 22:45 IMPRESSION: 1. Evolving RIGHT posterior-inferior cerebellar artery territory infarct with increasing edema and mass effect, no hemorrhagic conversion. 2. New mild obstructive hydrocephalus.  CT HEAD WITHOUT CONTRAST 02/22/2017 03:01 IMPRESSION: 1. Stable distribution of right PICA infarction. Stable associated mass effect with partial effacement of fourth ventricle and quadrigeminal plate cistern. Stable ventricle size with mild hydrocephalus. 2. No new acute intracranial abnormality identified.  Ct Head Wo Contrast 02/23/2017 IMPRESSION: 1. Unchanged right PICA infarct and associated posterior fossa mass effect. Unchanged mild hydrocephalus. 2. No new intracranial abnormality.   MRI shoulder  Right shoulder: 1. Full-thickness tear of the anterior fibers of the supraspinatus near its footplate. 2. Marked AC joint osteoarthritis with impression upon the myotendinous juncture of the  supraspinatus tendon. Associated subacromial and subdeltoid bursitis. Type 3 hook like acromial shape. 3. Linear signal undermining the anterior glenoid labrum is suspicious for possible labral tear though assessment is limited by lack of joint distention fluid.  Left shoulder: 1. Tendinosis of the supraspinatus and infraspinatus tendons without surfacing tear. 2. Moderate AC joint arthropathy impressing upon the myotendinous junction of the supraspinatus tendon. Trace subacromial bursitis. 3. Tiny focus of T2 hyperintensity along the anterior glenoid labrum could represent a tiny paralabral cyst, a secondary sign of possible labral tear but a discrete tear is not apparent given lack of joint distention.  Component     Latest Ref Rng & Units 02/19/2017 02/20/2017 02/21/2017  Cholesterol     0 - 200 mg/dL   141  Triglycerides     <150 mg/dL   253 (H)  HDL Cholesterol     >40 mg/dL   27 (L)  Total CHOL/HDL Ratio     RATIO   5.2  VLDL     0 - 40 mg/dL   51 (H)  LDL (calc)     0 - 99 mg/dL   63  Hemoglobin A1C     4.8 - 5.6 % 7.7 (H)    Mean Plasma  Glucose     mg/dL 174.29    HIV Screen 4th Generation wRfx     Non Reactive  Non Reactive   TSH     0.350 - 4.500 uIU/mL  0.765    Component     Latest Ref Rng & Units 03/11/2017  Sed Rate     0 - 30 mm/hr 9  CRP     0.0 - 4.9 mg/L 0.5  Anit Nuclear Antibody(ANA)     Negative Positive (A)  CK Total     24 - 204 U/L 43    Assessment: Hymie Gorr is a 52 year old with 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  Vascular risk factors include DM, HTN, HLD, smoker and prior strokes.  Patient did undergo right rotator cuff surgery on 04/19/2017.   Plan:  - continue plavix and lipitor for stroke prevention - stop ASA for now. - will add gabapentin 32m at night for left hand tingling numbness at bedtime.  - continue follow up with orthopedics for shoulder treatment - continue PT/OT  for shoulder pain  - will do EMG/NCS - will do C-spine MRI to evaluate cervical spine - if above work up neg, and pt symptoms not improving, may consider to repeat ANA and do other autoimmune work up as well as rheumatology referral if needed.  - Follow up with your primary care physician for stroke risk factor modification. Recommend maintain blood pressure goal <130/80, diabetes with hemoglobin A1c goal below 7.0% and lipids with LDL cholesterol goal below 70 mg/dL.  - check BP and glucose at home and record - follow up in 3 months with JJanett Billow  I spent more than 25 minutes of face to face time with the patient. Greater than 50% of time was spent in counseling and coordination of care. We discussed shoulder pain, add neurontin, MRI C-spine and EMG/NCS, follow-up with orthopedics.  JVenancio Poisson AGNP-BC  [facility] [address]University[place] [address] Phone [phone] [phone]: This document was prepared with digital dictation and possible smart phrase technology. Any transcriptional errors that result from this process are unintentional.

## 2017-09-17 ENCOUNTER — Ambulatory Visit: Payer: Self-pay | Admitting: Adult Health

## 2017-09-17 ENCOUNTER — Telehealth: Payer: Self-pay | Admitting: *Deleted

## 2017-09-17 ENCOUNTER — Telehealth: Payer: Self-pay | Admitting: Adult Health

## 2017-09-17 NOTE — Telephone Encounter (Signed)
Pt. presented to the office with fast food breakfast but stated he could not pay even $1 towards copay, so r/s for 9/10 and left without being seen/fim

## 2017-09-17 NOTE — Telephone Encounter (Signed)
Patient has had 3 no shows in this office. Per policy, request patient be discharged from [facility]. Does have current appointment that was rescheduled with check in staff this morning on 10/01/17 after patient left due to being unable to pay $1.00 towards co payment but did have fast food breakfast and coffee. This will be counted as a no show. Previous no shows on 08/22/17 and 04/24/17.

## 2017-09-17 NOTE — Telephone Encounter (Signed)
Requested dismissal from clinic. Request note sent to Angie. Thank you.

## 2017-09-17 NOTE — Telephone Encounter (Signed)
FYI- patient has had 3 no-shows in 2019. °

## 2017-09-18 ENCOUNTER — Encounter: Payer: Self-pay | Admitting: Neurology

## 2017-09-27 ENCOUNTER — Other Ambulatory Visit: Payer: Self-pay | Admitting: Neurology

## 2017-10-01 ENCOUNTER — Ambulatory Visit: Payer: Self-pay | Admitting: Adult Health

## 2018-03-20 ENCOUNTER — Encounter: Payer: Self-pay | Admitting: *Deleted

## 2018-03-20 NOTE — Research (Unsigned)
STROKE~AF Research study month 12 foloow up completed via phone. Patient denies any adverse events, changes in medication or diagnostic testing. He states he has not been in the hospital or had any Stroke symptoms. Modified Rankin Score 0.  [id number]  MOBILITY:    I HAVE NO PROBLEMS WALKING [x]   I HAVE SLIGHT PROBLEMS WALKING []   I HAVE MODERATE PROBLEMS WALKING []   I HAVE SEVERE PROBLEMS WALKING []   I AM UNABLE TO WALK  []     SELF-CARE:   I HAVE NO PROBLEMS WASING OR DRESSING MYSELF  [x]   I HAVE SLIGHT PROBLEMS WASHING OR DRESSING MYSELF  []   I HAVE MODERATE PROBLEMS WASHING OR DRESSING MYSELF []   I HAVE SEVERE PROBLEMS WASHING OR DRESSING MYSELF  []   I HAVE SEVERE PROBLEMS WASHING OR DRESSING MYSELF  []   I AM UNABLE TO WASH OR DRESS MYSELF []     USUAL ACTIVITIES: (E.G. WORK/STUDY/HOUSEWORK/FAMILY OR LEISURE ACTIVITIES.    I HAVE NO PROBLEMS DOING MY USUAL ACTIVITIES [x]   I HAVE SLIGHT PROBLEMS DOING MY USUAL ACTIVITIES []   I HAVE MODERATE PROBLEMS DOING MY USUAL ACTIVIITIES []   I HAVE SEVERE PROBLEMS DOING MY USUAL ACTIVITIES []   I AM UNABLE TO DO MY USUAL ACTIVITIES []     PAIN /DISCOMFORT   I HAVE NO PAIN OR DISCOMFORT [x]   I HAVE SLIGHT PAIN OR DISCOMFORT []   I HAVE MODERATE PAIN OR DISCOMFORT []   I HAVE SEVERE PAIN OR DISCOMFORT []   I HAVE EXTREME PAIN OR DISCOMFORT []     ANXIETY/DEPRESSION   I AM NOT ANXIOUS OR DEPRESSED [x]   I AM SLIGHTLY ANXIOUS OR DEPRESSED []   I AM MODERATELY ANXIOUS OR DREPRESSED []   I AM SEVERELY ANXIOUS OR DEPRESSED []   I AM EXTREMELY ANXIOUS OR DEPRESSED []     SCALE OF 0-100 HOW WOULD YOU RATE TODAY?  0 IS THE WORSE AND 100 IS THE BEST HEALTH YOU CAN IMAGINE: 100

## 2018-08-28 ENCOUNTER — Telehealth: Payer: Self-pay | Admitting: *Deleted

## 2018-08-28 ENCOUNTER — Encounter: Payer: Self-pay | Admitting: *Deleted

## 2018-08-28 DIAGNOSIS — Z006 Encounter for examination for normal comparison and control in clinical research program: Secondary | ICD-10-CM

## 2018-08-28 NOTE — Research (Signed)
STROKE~AF Research study month 18 telephone follow up completed. Patient states he has not had any changes in medication, no stroke like symptoms and has not been in the hospital. He denies having EKG but did have lab work last week. I thanked him for his continued participation. Next research required follow up is between 30-Jan-2019---27-Mar-2019.

## 2018-08-28 NOTE — Telephone Encounter (Signed)
STROKE~AF research study. Unsuccessful attempt to contact pt via Cell and home # Unable to leave message. Will try again later.

## 2019-02-04 ENCOUNTER — Encounter: Payer: Self-pay | Admitting: *Deleted

## 2019-02-04 DIAGNOSIS — Z006 Encounter for examination for normal comparison and control in clinical research program: Secondary | ICD-10-CM

## 2019-02-04 NOTE — Research (Signed)
16M stroke af  Patient doing well, no complaints of cp or shortness of breath. No ER or urgent care visits. No med changes per patient.  Will contact patient again in July for his 77M.

## 2019-08-19 IMAGING — MR MR SHOULDER*L* W/O CM
5 series · 40 of 40 positions shown · non-contrast
Comparison: None.

CLINICAL DATA: Left shoulder pain status post remote injury 20
years ago. Pain has been worsening since a stroke in January 2017.
Arthritis of the left acromioclavicular joint. Impingement syndrome
of the left shoulder.

Right shoulder pain after fall January 2017. Limited and painful
range of motion.
EXAM:
MRI OF THE LEFT SHOULDER WITHOUT CONTRAST;
MRI OF THE RIGHT SHOULDER WITHOUT CONTRAST
TECHNIQUE: Multiplanar, multisequence MR imaging of both shoulders was
performed. No intravenous contrast was administered.

[Series 4: T2 fat-sat · coronal · 4.0mm · 0.62mm/px · 7 of 23 slices shown (1 of 3)]
[im 1/23]
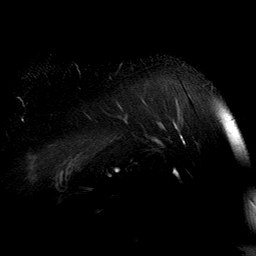
[im 4/23]
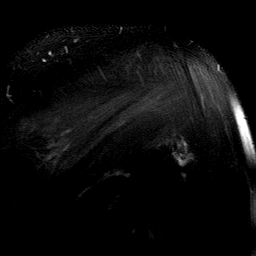
[im 8/23]
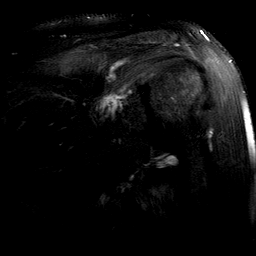
[im 12/23]
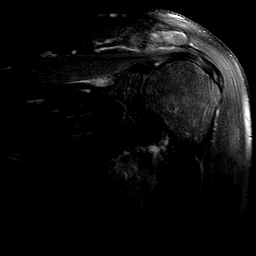
[im 15/23]
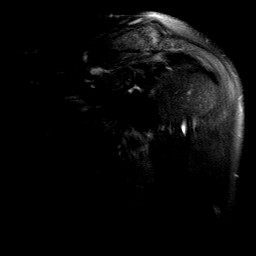
[im 19/23]
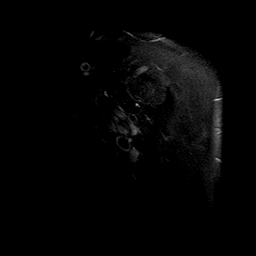
[im 23/23]
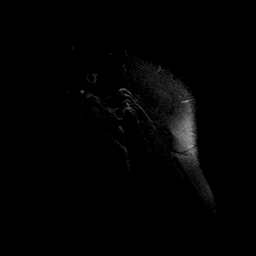

[Series 5: PD · coronal · 4.0mm · 0.62mm/px · 8 of 23 slices shown]
[im 1/23]
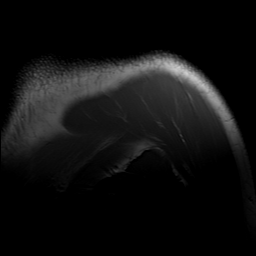
[im 4/23]
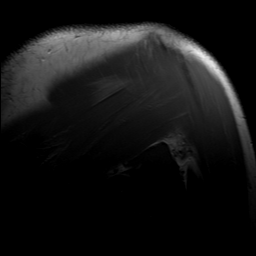
[im 7/23]
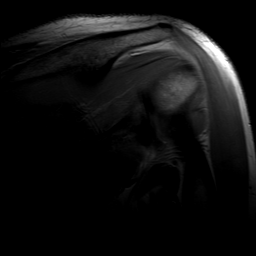
[im 10/23]
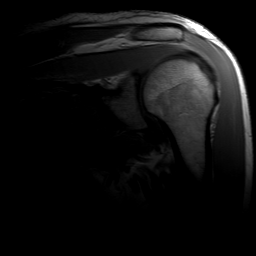
[im 13/23]
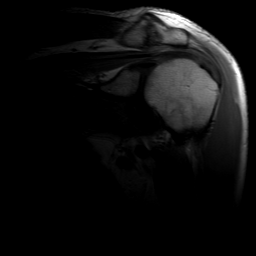
[im 16/23]
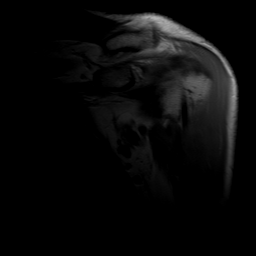
[im 19/23]
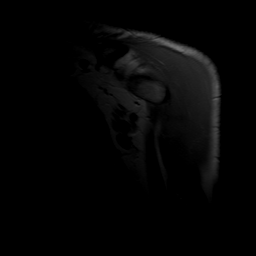
[im 23/23]
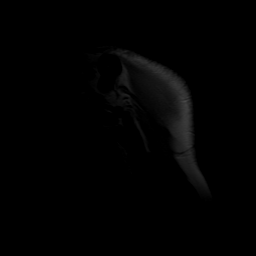

[Series 6: T1 · oblique · 4.0mm · 0.62mm/px · 8 of 23 slices shown]
[im 1/23]
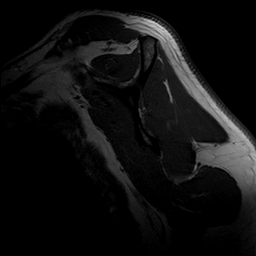
[im 4/23]
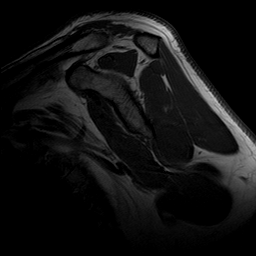
[im 7/23]
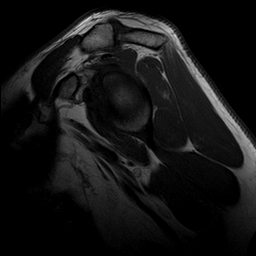
[im 10/23]
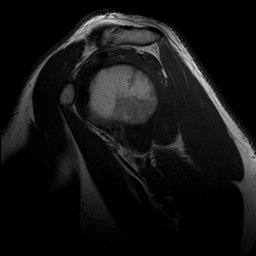
[im 13/23]
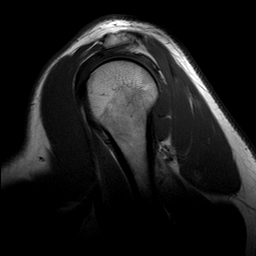
[im 16/23]
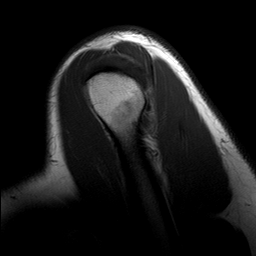
[im 19/23]
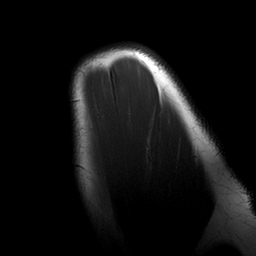
[im 23/23]
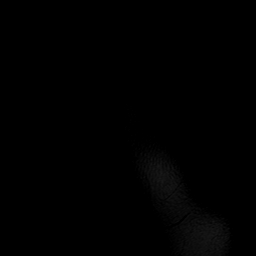

[Series 7: T2 fat-sat · oblique · 4.0mm · 0.62mm/px · 8 of 23 slices shown (2 of 3)]
[im 1/23]
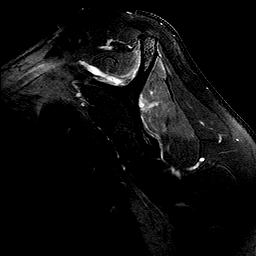
[im 4/23]
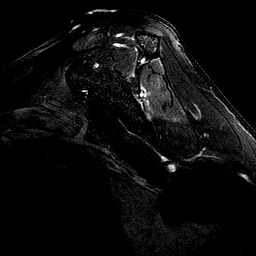
[im 7/23]
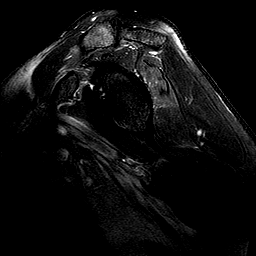
[im 10/23]
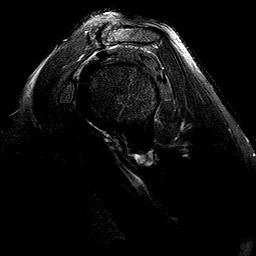
[im 13/23]
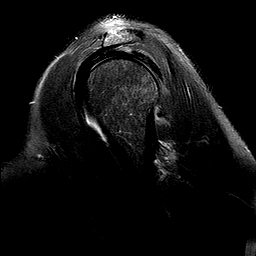
[im 16/23]
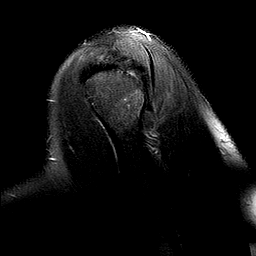
[im 19/23]
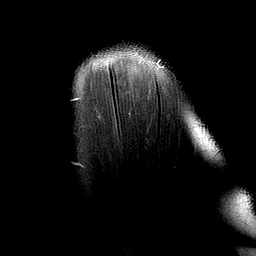
[im 23/23]
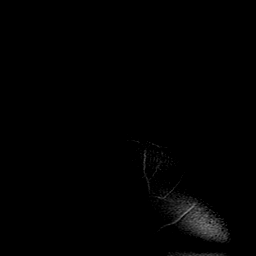

[Series 8: T2 fat-sat · axial · 4.0mm · 0.47mm/px · z∈[-27,+79]mm · 9 of 25 slices shown (3 of 3)]
[im 1/25]
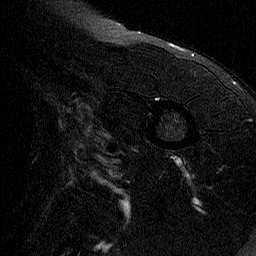
[im 4/25]
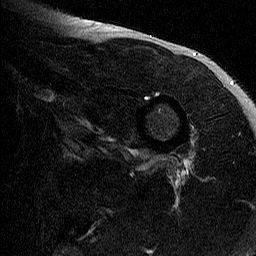
[im 7/25]
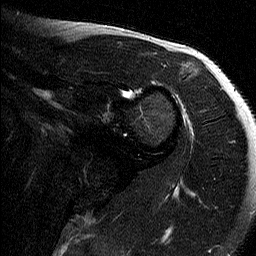
[im 10/25]
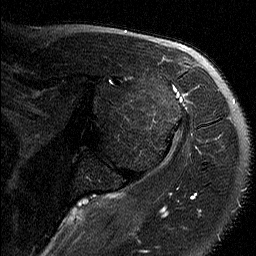
[im 13/25]
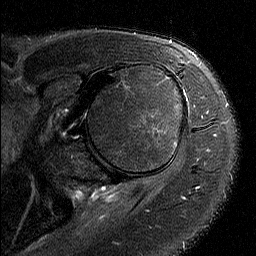
[im 16/25]
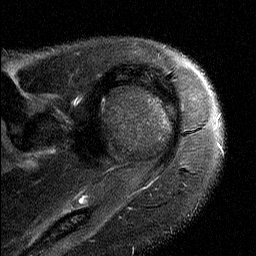
[im 19/25]
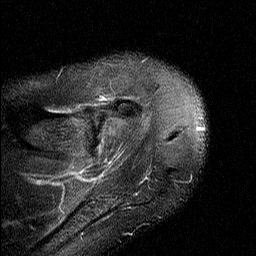
[im 22/25]
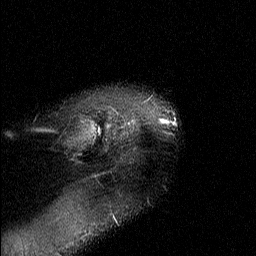
[im 25/25]
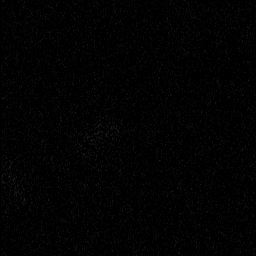

[40 of 40 positions shown; findings below may reference images not displayed]

FINDINGS: Repeat imaging was performed due to patient motion.

RIGHT SHOULDER

Rotator cuff: Tendinosis of the supraspinatus and infraspinatus
tendons. Anterior supraspinatus full-thickness tear at the tendon
insertion/footplate, series 7, image 18 and series 4, image 15.
Intact infraspinatus, subscapularis, teres minor tendons.

Muscles:  Mild atrophy of the supraspinatus.

Biceps long head:  Intact

Acromioclavicular Joint: Marked arthropathy of the acromioclavicular
joint. There is reactive edema, undersurface spurring impressing
upon the myotendinous junction of the supraspinatus tendon and
contributing to impingement. Type III hook like acromion.
Subacromial and subdeltoid bursal fluid consistent with bursitis.

Glenohumeral Joint: No joint effusion.  No focal chondral defect.

Labrum: Limited by lack of joint distention. Intermediate linear
signal is seen of undermining the anterior glenoid labrum suspicious
for possible tear, (series 3, image 15) but no secondary signs of
definite labral tear such as a pseudocyst.

Bones: Reactive marrow edema about the AC joint secondary to
osteoarthritis.

Other: None

***

LEFT SHOULDER

Rotator cuff: Tendinosis of the supraspinatus and infraspinatus
without articular or bursal surfacing tear.

Muscles: Mild supraspinatus atrophy.

Biceps tendon: Intact

Acromioclavicular joint: Moderate arthropathy of the
acromioclavicular joint with slight undersurface impression upon the
myotendinous junction of the supraspinatus tendon. Type 2 acromial
shape. Trace subacromial bursal edema.

Glenohumeral joint: No joint effusion.  No focal chondral defect.

Labrum: Limited due to lack of joint distention. Tiny focus of T2
hyperintensity along the anterior glenoid labrum could potentially
represent a tiny paralabral cyst but this is indeterminate.

Bones: Marrow signal abnormality, fracture or joint dislocation.
IMPRESSION: Right shoulder:

1. Full-thickness tear of the anterior fibers of the supraspinatus
near its footplate.
2. Marked AC joint osteoarthritis with impression upon the
myotendinous juncture of the supraspinatus tendon. Associated
subacromial and subdeltoid bursitis. Type 3 hook like acromial
shape.
3. Linear signal undermining the anterior glenoid labrum is
suspicious for possible labral tear though assessment is limited by
lack of joint distention fluid.

Left shoulder:

1. Tendinosis of the supraspinatus and infraspinatus tendons without
surfacing tear.
2. Moderate AC joint arthropathy impressing upon the myotendinous
junction of the supraspinatus tendon. Trace subacromial bursitis.
3. Tiny focus of T2 hyperintensity along the anterior glenoid labrum
could represent a tiny paralabral cyst, a secondary sign of possible
labral tear but a discrete tear is not apparent given lack of joint
distention.

## 2019-09-10 ENCOUNTER — Encounter: Payer: Self-pay | Admitting: *Deleted

## 2020-02-15 ENCOUNTER — Encounter: Payer: Self-pay | Admitting: *Deleted

## 2020-02-15 DIAGNOSIS — Z006 Encounter for examination for normal comparison and control in clinical research program: Secondary | ICD-10-CM

## 2020-02-15 NOTE — Research (Signed)
Stroke AF   37M   Patient doing well, no complaints of chest pain or shortness of breath. He said that he is so blessed each morning that he wakes up. Thanked him for participating in the study. He was very thankful for the follow up calls with the study.    Current Outpatient Medications:  .  aspirin EC 81 MG tablet, Take 324 mg by mouth daily., Disp: , Rfl:  .  atorvastatin (LIPITOR) 80 MG tablet, Take 1 tablet (80 mg total) by mouth daily at 6 PM., Disp: 30 tablet, Rfl: 0 .  clopidogrel (PLAVIX) 75 MG tablet, Take 1 tablet (75 mg total) by mouth daily., Disp: 30 tablet, Rfl: 0 .  DULoxetine (CYMBALTA) 30 MG capsule, Take 30 mg by mouth daily., Disp: , Rfl:  .  gabapentin (NEURONTIN) 300 MG capsule, Take 1 capsule (300 mg total) by mouth at bedtime., Disp: 30 capsule, Rfl: 3 .  glimepiride (AMARYL) 4 MG tablet, Take 1 tablet (4 mg total) by mouth daily with breakfast., Disp: 30 tablet, Rfl: 0 .  lisinopril (PRINIVIL,ZESTRIL) 40 MG tablet, Take 1 tablet (40 mg total) by mouth daily., Disp: 30 tablet, Rfl: 0 .  metFORMIN (GLUCOPHAGE) 1000 MG tablet, TAKE 1 TABLET BY MOUTH TWICE DAILY WITH MEALS, Disp: 180 tablet, Rfl: 0 .  ondansetron (ZOFRAN ODT) 4 MG disintegrating tablet, Take 1 tablet (4 mg total) by mouth every 8 (eight) hours as needed for nausea or vomiting., Disp: 20 tablet, Rfl: 0 .  buPROPion (WELLBUTRIN SR) 100 MG 12 hr tablet, Take 100 mg by mouth 2 (two) times daily. , Disp: , Rfl:

## 2022-09-03 ENCOUNTER — Other Ambulatory Visit (HOSPITAL_COMMUNITY): Payer: Self-pay

## 2022-09-03 ENCOUNTER — Emergency Department: Payer: Self-pay

## 2022-09-03 ENCOUNTER — Inpatient Hospital Stay
Admission: EM | Admit: 2022-09-03 | Discharge: 2022-09-05 | DRG: 322 | Disposition: A | Discharge status: Home / Self Care | Payer: Self-pay | Attending: Student in an Organized Health Care Education/Training Program | Admitting: Student in an Organized Health Care Education/Training Program

## 2022-09-03 ENCOUNTER — Other Ambulatory Visit: Payer: Self-pay

## 2022-09-03 ENCOUNTER — Encounter: Payer: Self-pay | Admitting: Emergency Medicine

## 2022-09-03 DIAGNOSIS — R000 Tachycardia, unspecified: Secondary | ICD-10-CM | POA: Diagnosis present

## 2022-09-03 DIAGNOSIS — E1165 Type 2 diabetes mellitus with hyperglycemia: Secondary | ICD-10-CM | POA: Diagnosis present

## 2022-09-03 DIAGNOSIS — R451 Restlessness and agitation: Secondary | ICD-10-CM | POA: Diagnosis not present

## 2022-09-03 DIAGNOSIS — Z833 Family history of diabetes mellitus: Secondary | ICD-10-CM

## 2022-09-03 DIAGNOSIS — I5022 Chronic systolic (congestive) heart failure: Secondary | ICD-10-CM | POA: Diagnosis present

## 2022-09-03 DIAGNOSIS — I214 Non-ST elevation (NSTEMI) myocardial infarction: Principal | ICD-10-CM | POA: Diagnosis present

## 2022-09-03 DIAGNOSIS — Z716 Tobacco abuse counseling: Secondary | ICD-10-CM

## 2022-09-03 DIAGNOSIS — I2510 Atherosclerotic heart disease of native coronary artery without angina pectoris: Secondary | ICD-10-CM | POA: Diagnosis present

## 2022-09-03 DIAGNOSIS — I10 Essential (primary) hypertension: Secondary | ICD-10-CM | POA: Diagnosis present

## 2022-09-03 DIAGNOSIS — Z8673 Personal history of transient ischemic attack (TIA), and cerebral infarction without residual deficits: Secondary | ICD-10-CM

## 2022-09-03 DIAGNOSIS — I447 Left bundle-branch block, unspecified: Secondary | ICD-10-CM | POA: Diagnosis present

## 2022-09-03 DIAGNOSIS — Z1152 Encounter for screening for COVID-19: Secondary | ICD-10-CM

## 2022-09-03 DIAGNOSIS — Z7982 Long term (current) use of aspirin: Secondary | ICD-10-CM

## 2022-09-03 DIAGNOSIS — Z79899 Other long term (current) drug therapy: Secondary | ICD-10-CM

## 2022-09-03 DIAGNOSIS — Z7902 Long term (current) use of antithrombotics/antiplatelets: Secondary | ICD-10-CM

## 2022-09-03 DIAGNOSIS — Z955 Presence of coronary angioplasty implant and graft: Secondary | ICD-10-CM

## 2022-09-03 DIAGNOSIS — Z8249 Family history of ischemic heart disease and other diseases of the circulatory system: Secondary | ICD-10-CM

## 2022-09-03 DIAGNOSIS — I110 Hypertensive heart disease with heart failure: Secondary | ICD-10-CM | POA: Diagnosis present

## 2022-09-03 DIAGNOSIS — F17210 Nicotine dependence, cigarettes, uncomplicated: Secondary | ICD-10-CM | POA: Diagnosis present

## 2022-09-03 DIAGNOSIS — Z7984 Long term (current) use of oral hypoglycemic drugs: Secondary | ICD-10-CM

## 2022-09-03 DIAGNOSIS — I429 Cardiomyopathy, unspecified: Secondary | ICD-10-CM | POA: Diagnosis present

## 2022-09-03 DIAGNOSIS — E785 Hyperlipidemia, unspecified: Secondary | ICD-10-CM | POA: Diagnosis present

## 2022-09-03 DIAGNOSIS — R200 Anesthesia of skin: Secondary | ICD-10-CM | POA: Diagnosis present

## 2022-09-03 DIAGNOSIS — F419 Anxiety disorder, unspecified: Secondary | ICD-10-CM | POA: Diagnosis present

## 2022-09-03 DIAGNOSIS — R739 Hyperglycemia, unspecified: Secondary | ICD-10-CM

## 2022-09-03 DIAGNOSIS — K219 Gastro-esophageal reflux disease without esophagitis: Secondary | ICD-10-CM | POA: Diagnosis present

## 2022-09-03 DIAGNOSIS — F32A Depression, unspecified: Secondary | ICD-10-CM | POA: Diagnosis present

## 2022-09-03 LAB — CBC WITH DIFFERENTIAL/PLATELET
Abs Immature Granulocytes: 0.03 10*3/uL (ref 0.00–0.07)
Basophils Absolute: 0.1 10*3/uL (ref 0.0–0.1)
Basophils Relative: 1 %
Eosinophils Absolute: 0.1 10*3/uL (ref 0.0–0.5)
Eosinophils Relative: 1 %
HCT: 49.2 % (ref 39.0–52.0)
Hemoglobin: 16.6 g/dL (ref 13.0–17.0)
Immature Granulocytes: 0 %
Lymphocytes Relative: 26 %
Lymphs Abs: 2.5 10*3/uL (ref 0.7–4.0)
MCH: 29.4 pg (ref 26.0–34.0)
MCHC: 33.7 g/dL (ref 30.0–36.0)
MCV: 87.2 fL (ref 80.0–100.0)
Monocytes Absolute: 0.7 10*3/uL (ref 0.1–1.0)
Monocytes Relative: 7 %
Neutro Abs: 6.5 10*3/uL (ref 1.7–7.7)
Neutrophils Relative %: 65 %
Platelets: 338 10*3/uL (ref 150–400)
RBC: 5.64 MIL/uL (ref 4.22–5.81)
RDW: 12.6 % (ref 11.5–15.5)
WBC: 10 10*3/uL (ref 4.0–10.5)
nRBC: 0 % (ref 0.0–0.2)

## 2022-09-03 LAB — PROTIME-INR
INR: 1 (ref 0.8–1.2)
INR: 1.1 (ref 0.8–1.2)
Prothrombin Time: 13.3 seconds (ref 11.4–15.2)
Prothrombin Time: 14.1 seconds (ref 11.4–15.2)

## 2022-09-03 LAB — BASIC METABOLIC PANEL
Anion gap: 8 (ref 5–15)
BUN: 19 mg/dL (ref 6–20)
CO2: 23 mmol/L (ref 22–32)
Calcium: 8.5 mg/dL — ABNORMAL LOW (ref 8.9–10.3)
Chloride: 106 mmol/L (ref 98–111)
Creatinine, Ser: 1.02 mg/dL (ref 0.61–1.24)
GFR, Estimated: >60 mL/min (ref >60)
Glucose, Bld: 172 mg/dL — ABNORMAL HIGH (ref 70–99)
Potassium: 3.8 mmol/L (ref 3.5–5.1)
Sodium: 137 mmol/L (ref 135–145)

## 2022-09-03 LAB — URINALYSIS, ROUTINE W REFLEX MICROSCOPIC
Bacteria, UA: NONE SEEN
Bilirubin Urine: NEGATIVE
Glucose, UA: >=500 mg/dL — AB
Hgb urine dipstick: NEGATIVE
Ketones, ur: NEGATIVE mg/dL
Leukocytes,Ua: NEGATIVE
Nitrite: NEGATIVE
Protein, ur: NEGATIVE mg/dL
Specific Gravity, Urine: 1.025 (ref 1.005–1.030)
Squamous Epithelial / HPF: NONE SEEN /HPF (ref 0–5)
pH: 5 (ref 5.0–8.0)

## 2022-09-03 LAB — LIPID PANEL
Cholesterol: 200 mg/dL (ref 0–200)
HDL: 27 mg/dL — ABNORMAL LOW (ref >40)
LDL Cholesterol: 109 mg/dL — ABNORMAL HIGH (ref 0–99)
Total CHOL/HDL Ratio: 7.4 RATIO
Triglycerides: 319 mg/dL — ABNORMAL HIGH (ref <150)
VLDL: 64 mg/dL — ABNORMAL HIGH (ref 0–40)

## 2022-09-03 LAB — COMPREHENSIVE METABOLIC PANEL
ALT: 14 U/L (ref 0–44)
AST: 14 U/L — ABNORMAL LOW (ref 15–41)
Albumin: 3.9 g/dL (ref 3.5–5.0)
Alkaline Phosphatase: 80 U/L (ref 38–126)
Anion gap: 12 (ref 5–15)
BUN: 19 mg/dL (ref 6–20)
CO2: 21 mmol/L — ABNORMAL LOW (ref 22–32)
Calcium: 9.3 mg/dL (ref 8.9–10.3)
Chloride: 103 mmol/L (ref 98–111)
Creatinine, Ser: 1.03 mg/dL (ref 0.61–1.24)
GFR, Estimated: >60 mL/min (ref >60)
Glucose, Bld: 358 mg/dL — ABNORMAL HIGH (ref 70–99)
Potassium: 4.4 mmol/L (ref 3.5–5.1)
Sodium: 136 mmol/L (ref 135–145)
Total Bilirubin: 0.7 mg/dL (ref 0.3–1.2)
Total Protein: 7.4 g/dL (ref 6.5–8.1)

## 2022-09-03 LAB — BLOOD GAS, VENOUS
Acid-Base Excess: 1.1 mmol/L (ref 0.0–2.0)
Bicarbonate: 23.4 mmol/L (ref 20.0–28.0)
O2 Saturation: 78.2 %
Patient temperature: 37
pCO2, Ven: 30 mmHg — ABNORMAL LOW (ref 44–60)
pH, Ven: 7.5 — ABNORMAL HIGH (ref 7.25–7.43)
pO2, Ven: 40 mmHg (ref 32–45)

## 2022-09-03 LAB — TROPONIN I (HIGH SENSITIVITY)
Troponin I (High Sensitivity): 85 ng/L — ABNORMAL HIGH (ref <18)
Troponin I (High Sensitivity): 75 ng/L — ABNORMAL HIGH (ref <18)
Troponin I (High Sensitivity): 73 ng/L — ABNORMAL HIGH (ref <18)
Troponin I (High Sensitivity): 102 ng/L (ref <18)

## 2022-09-03 LAB — BETA-HYDROXYBUTYRIC ACID: Beta-Hydroxybutyric Acid: 0.21 mmol/L (ref 0.05–0.27)

## 2022-09-03 LAB — CBG MONITORING, ED: Glucose-Capillary: 336 mg/dL — ABNORMAL HIGH (ref 70–99)

## 2022-09-03 LAB — APTT: aPTT: 38 seconds — ABNORMAL HIGH (ref 24–36)

## 2022-09-03 LAB — HEPARIN LEVEL (UNFRACTIONATED): Heparin Unfractionated: 0.14 IU/mL — ABNORMAL LOW (ref 0.30–0.70)

## 2022-09-03 LAB — MAGNESIUM: Magnesium: 2.1 mg/dL (ref 1.7–2.4)

## 2022-09-03 LAB — HIV ANTIBODY (ROUTINE TESTING W REFLEX): HIV Screen 4th Generation wRfx: NONREACTIVE

## 2022-09-03 MED ORDER — NITROGLYCERIN IN D5W 200-5 MCG/ML-% IV SOLN
20.0000 ug/min | INTRAVENOUS | Status: DC
  Administered 2022-09-03: 20 ug/min via INTRAVENOUS
  Filled 2022-09-03: qty 250

## 2022-09-03 MED ORDER — SODIUM CHLORIDE 0.9 % IV SOLN: INTRAVENOUS | Status: DC

## 2022-09-03 MED ORDER — HEPARIN (PORCINE) 25000 UT/250ML-% IV SOLN
1350.0000 [IU]/h | INTRAVENOUS | Status: DC
  Administered 2022-09-03: 950 [IU]/h via INTRAVENOUS
  Administered 2022-09-04: 1350 [IU]/h via INTRAVENOUS
  Filled 2022-09-03 (×2): qty 250

## 2022-09-03 MED ORDER — NITROGLYCERIN 0.4 MG SL SUBL
0.4000 mg | SUBLINGUAL_TABLET | Freq: Once | SUBLINGUAL | Status: AC
  Administered 2022-09-03: 0.4 mg via SUBLINGUAL
  Filled 2022-09-03: qty 1

## 2022-09-03 MED ORDER — INSULIN DETEMIR 100 UNIT/ML ~~LOC~~ SOLN
8.0000 [IU] | Freq: Every day | SUBCUTANEOUS | Status: DC
  Administered 2022-09-03: 8 [IU] via SUBCUTANEOUS
  Filled 2022-09-03 (×3): qty 0.08

## 2022-09-03 MED ORDER — HEPARIN BOLUS VIA INFUSION
2400.0000 [IU] | Freq: Once | INTRAVENOUS | Status: AC
  Administered 2022-09-03: 2400 [IU] via INTRAVENOUS
  Filled 2022-09-03: qty 2400

## 2022-09-03 MED ORDER — NITROGLYCERIN IN D5W 200-5 MCG/ML-% IV SOLN
50.0000 ug/min | INTRAVENOUS | Status: DC
  Filled 2022-09-03: qty 250

## 2022-09-03 MED ORDER — ASPIRIN 81 MG PO TBEC: 324.0000 mg | DELAYED_RELEASE_TABLET | Freq: Every day | ORAL | Status: DC

## 2022-09-03 MED ORDER — NICOTINE 21 MG/24HR TD PT24: 21.0000 mg | MEDICATED_PATCH | Freq: Every day | TRANSDERMAL | Status: DC

## 2022-09-03 MED ORDER — ATORVASTATIN CALCIUM 80 MG PO TABS
80.0000 mg | ORAL_TABLET | Freq: Every day | ORAL | Status: DC
  Administered 2022-09-03 – 2022-09-04 (×2): 80 mg via ORAL
  Filled 2022-09-03: qty 1
  Filled 2022-09-03: qty 4

## 2022-09-03 MED ORDER — INSULIN ASPART 100 UNIT/ML IJ SOLN: 0.0000 [IU] | Freq: Every day | INTRAMUSCULAR | Status: DC

## 2022-09-03 MED ORDER — INSULIN ASPART 100 UNIT/ML IJ SOLN
4.0000 [IU] | Freq: Three times a day (TID) | INTRAMUSCULAR | Status: DC
  Filled 2022-09-03: qty 1

## 2022-09-03 MED ORDER — ACETAMINOPHEN 325 MG PO TABS
650.0000 mg | ORAL_TABLET | ORAL | Status: DC | PRN
  Administered 2022-09-04: 650 mg via ORAL
  Filled 2022-09-03: qty 2

## 2022-09-03 MED ORDER — SODIUM CHLORIDE 0.9 % IV BOLUS
1000.0000 mL | Freq: Once | INTRAVENOUS | Status: AC
  Administered 2022-09-03: 1000 mL via INTRAVENOUS

## 2022-09-03 MED ORDER — HEPARIN SODIUM (PORCINE) 5000 UNIT/ML IJ SOLN: 4000.0000 [IU] | Freq: Once | INTRAMUSCULAR | Status: DC

## 2022-09-03 MED ORDER — INSULIN ASPART 100 UNIT/ML IJ SOLN
0.0000 [IU] | Freq: Three times a day (TID) | INTRAMUSCULAR | Status: DC
  Administered 2022-09-03: 11 [IU] via SUBCUTANEOUS
  Filled 2022-09-03 (×3): qty 1

## 2022-09-03 MED ORDER — ONDANSETRON HCL 4 MG/2ML IJ SOLN: 4.0000 mg | Freq: Four times a day (QID) | INTRAMUSCULAR | Status: DC | PRN

## 2022-09-03 MED ORDER — MORPHINE SULFATE (PF) 4 MG/ML IV SOLN
4.0000 mg | Freq: Once | INTRAVENOUS | Status: AC
  Administered 2022-09-03: 4 mg via INTRAVENOUS
  Filled 2022-09-03: qty 1

## 2022-09-03 MED ORDER — MORPHINE SULFATE (PF) 4 MG/ML IV SOLN
4.0000 mg | INTRAVENOUS | Status: DC | PRN
  Administered 2022-09-04 (×3): 4 mg via INTRAVENOUS
  Filled 2022-09-03 (×4): qty 1

## 2022-09-03 MED ORDER — LISINOPRIL 20 MG PO TABS
40.0000 mg | ORAL_TABLET | Freq: Every day | ORAL | Status: DC
  Administered 2022-09-03 – 2022-09-05 (×3): 40 mg via ORAL
  Filled 2022-09-03 (×2): qty 2
  Filled 2022-09-03: qty 4

## 2022-09-03 MED ORDER — NITROGLYCERIN 2 % TD OINT
1.0000 [in_us] | TOPICAL_OINTMENT | Freq: Four times a day (QID) | TRANSDERMAL | Status: DC
  Filled 2022-09-03: qty 1

## 2022-09-03 MED ORDER — HEPARIN BOLUS VIA INFUSION
4000.0000 [IU] | Freq: Once | INTRAVENOUS | Status: AC
  Administered 2022-09-03: 4000 [IU] via INTRAVENOUS
  Filled 2022-09-03: qty 4000

## 2022-09-03 NOTE — Assessment & Plan Note (Deleted)
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

## 2022-09-03 NOTE — Consult Note (Signed)
ANTICOAGULATION CONSULT NOTE - Initial Consult  Pharmacy Consult for Heparin infusion  Indication: chest pain/ACS  No Known Allergies  Patient Measurements: Height: 5\' 9"  (175.3 cm) Weight: 80.3 kg (177 lb) IBW/kg (Calculated) : 70.7 Heparin Dosing Weight: 80.3 kg  Vital Signs: Temp: 98.6 F (37 C) (08/12 2032) Temp Source: Oral (08/12 2032) BP: 129/82 (08/12 1800) Pulse Rate: 87 (08/12 1800)  Labs: Recent Labs    09/03/22 0759 09/03/22 0954 09/03/22 1435 09/03/22 1637 09/03/22 2025  HGB 16.6  --   --   --   --   HCT 49.2  --   --   --   --   PLT 338  --   --   --   --   APTT 38*  --   --   --   --   LABPROT 13.3  --  14.1  --   --   INR 1.0  --  1.1  --   --   HEPARINUNFRC  --   --   --   --  0.14*  CREATININE 1.03  --   --   --  1.02  TROPONINIHS 85* 75* 73* 102*  --     Estimated Creatinine Clearance: 79.9 mL/min (by C-G formula based on SCr of 1.02 mg/dL).   Medical History: Past Medical History:  Diagnosis Date   Coronary artery disease    Diabetes mellitus without complication (HCC)    GERD (gastroesophageal reflux disease)    Hypertension    Stroke (cerebrum) (HCC)     Medications:  (Not in a hospital admission)  Scheduled:   [START ON 09/04/2022] aspirin EC  324 mg Oral Daily   atorvastatin  80 mg Oral q1800   heparin  2,400 Units Intravenous Once   insulin aspart  0-15 Units Subcutaneous TID WC   insulin aspart  0-5 Units Subcutaneous QHS   insulin aspart  4 Units Subcutaneous TID WC   insulin detemir  8 Units Subcutaneous Daily   lisinopril  40 mg Oral Daily   nitroGLYCERIN  1 inch Topical Q6H   Infusions:   sodium chloride 20 mL/hr at 09/03/22 1715   heparin 950 Units/hr (09/03/22 1715)   PRN:  Anti-infectives (From admission, onward)    None       Assessment: Pharmacy consulted to heparin for ACS. Pt has a hx of CVA, presented with chest pain x 2 days. Pain went to right arm.  EKG sinus rhythm with LBBB. NO DOAC PTA noted. Trop  75.   8/12 2025  HL 0.14  Goal of Therapy:  Heparin level 0.3-0.7 units/ml Monitor platelets by anticoagulation protocol: Yes   Plan:  - Heparin level of 0.14 is subtherapeutic - Heparin bolus of 2400 units and increase infusion rate to 1200 units/hr - Next HL in 6 hours - Daily CBC while on Heparin drip  Clovia Cuff, PharmD, BCPS 09/03/2022 8:54 PM

## 2022-09-03 NOTE — ED Notes (Signed)
Pt upset about waiting on cardiology. Pt updated on plan.

## 2022-09-03 NOTE — Assessment & Plan Note (Signed)
Lipid panel  High dose statin  

## 2022-09-03 NOTE — Assessment & Plan Note (Signed)
PPI ?

## 2022-09-03 NOTE — Consult Note (Signed)
Brief cardiology consult note Patient initially presented via EMS as an code STEMI EKG showed left bundle branch block which was not new with no clear evidence for acute ischemia did not meet Sgarbossa criteria Patient reportedly has a known history of coronary disease with PCI and stent in the distant past 2010  Hemodynamically stable blunted troponins Patient still having chest pain symptoms on nitro and heparin  Patient being admitted will transition from IV heparin to paced  Recommend Lexiscan Myoview for further evaluation of ischemia  Consider invasive strategy if symptoms persist or abnormal imaging  Full consult note to follow

## 2022-09-03 NOTE — Assessment & Plan Note (Signed)
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

## 2022-09-03 NOTE — ED Notes (Signed)
Pt c/o of severe chest pain, MD notified of troponin increase. Stat EKG done

## 2022-09-03 NOTE — Progress Notes (Signed)
   09/03/22 0800  Spiritual Encounters  Type of Visit Initial  Care provided to: Pt and family  Conversation partners present during encounter Nurse  Referral source Code page  Reason for visit Code  OnCall Visit Yes   Chaplain responded to Code Stemi and provided initial care to wife and patient. Code later canceled.

## 2022-09-03 NOTE — Assessment & Plan Note (Signed)
BP stable Titrate home regimen 

## 2022-09-03 NOTE — Assessment & Plan Note (Signed)
Positive recurring central chest pain over the past 24 hours in the setting of prior history of coronary artery disease status post stenting roughly 20 years ago Concern for unstable angina  EKG sinus tach w/ new LBBB  Trop in 70s  HEART Score around 6-7  Started on heparin and NTG in ER  Will continue  2D ECHO  Risk stratification labs  Discussed smoking cessation at length  Follow up formal cardiology recommendations

## 2022-09-03 NOTE — ED Provider Notes (Signed)
[facility] Provider Note    Event Date/Time   First MD Initiated Contact with Patient 09/03/22 [phone]     (approximate)   History   Chest Pain   HPI Kenneth [name] is a 57 y.o. male with HTN, diabetes, chronic smoking history who presents today for chest pain.  Patient notes constant chest pain over the past 2 days which worsened this morning.  Today he also noted some shortness of breath.  He denies diaphoresis, nausea, vomiting, abdominal pain, leg swelling.  Chest pain was 7/10 and describes it as crushing pressure.  Chest pain completely resolved with nitro in the field.  Patient smokes 1.5 packs/day.  Patient was called a code STEMI in the field due to new left bundle branch block with some concern for meeting possible sgarbossa criteria.     Physical Exam   Triage Vital Signs: ED Triage Vitals  Encounter Vitals Group     BP 09/03/22 0801 (!) 148/99     Systolic BP Percentile --      Diastolic BP Percentile --      Pulse Rate 09/03/22 0801 97     Resp 09/03/22 0801 (!) 29     Temp 09/03/22 0801 98.7 F (37.1 C)     Temp Source 09/03/22 0801 Oral     SpO2 09/03/22 0801 96 %     Weight 09/03/22 0801 177 lb (80.3 kg)     Height 09/03/22 0801 5\' 9"  (1.753 m)     Head Circumference --      Peak Flow --      Pain Score 09/03/22 0803 0     Pain Loc --      Pain Education --      Exclude from Growth Chart --     Most recent vital signs: Vitals:   09/03/22 1230 09/03/22 1330  BP: (!) 159/106 (!) 136/94  Pulse: (!) 115 (!) 109  Resp: (!) 24 (!) 27  Temp:    SpO2: 92% 93%   Physical Exam: I have reviewed the vital signs and nursing notes. General: Awake, alert, no acute distress.  Nontoxic appearing. Head:  Atraumatic, normocephalic.   ENT:  EOM intact, PERRL. Oral mucosa is pink and moist with no lesions. Neck: Neck is supple with full range of motion, No meningeal signs. Cardiovascular:  RRR, No murmurs. Peripheral pulses palpable and  equal bilaterally. Respiratory:  Symmetrical chest wall expansion.  No rhonchi, rales, or wheezes.  Good air movement throughout.  No use of accessory muscles.   Musculoskeletal:  No cyanosis or edema. Moving extremities with full ROM Abdomen:  Soft, nontender, nondistended. Neuro:  GCS 15, moving all four extremities, interacting appropriately. Speech clear. Psych:  Calm, appropriate.   Skin:  Warm, dry, no rash.    ED Results / Procedures / Treatments   Labs (all labs ordered are listed, but only abnormal results are displayed) Labs Reviewed  APTT - Abnormal; Notable for the following components:      Result Value   aPTT 38 (*)    All other components within normal limits  COMPREHENSIVE METABOLIC PANEL - Abnormal; Notable for the following components:   CO2 21 (*)    Glucose, Bld 358 (*)    AST 14 (*)    All other components within normal limits  LIPID PANEL - Abnormal; Notable for the following components:   Triglycerides 319 (*)    HDL 27 (*)    VLDL 64 (*)  LDL Cholesterol 109 (*)    All other components within normal limits  URINALYSIS, ROUTINE W REFLEX MICROSCOPIC - Abnormal; Notable for the following components:   Color, Urine YELLOW (*)    APPearance CLEAR (*)    Glucose, UA >=500 (*)    All other components within normal limits  BLOOD GAS, VENOUS - Abnormal; Notable for the following components:   pH, Ven 7.5 (*)    pCO2, Ven 30 (*)    All other components within normal limits  TROPONIN I (HIGH SENSITIVITY) - Abnormal; Notable for the following components:   Troponin I (High Sensitivity) 85 (*)    All other components within normal limits  TROPONIN I (HIGH SENSITIVITY) - Abnormal; Notable for the following components:   Troponin I (High Sensitivity) 75 (*)    All other components within normal limits  CBC WITH DIFFERENTIAL/PLATELET  PROTIME-INR  BETA-HYDROXYBUTYRIC ACID  PROTIME-INR  HEPARIN LEVEL (UNFRACTIONATED)     EKG My EKG interpretation on  arrival shows left bundle branch block.  This does not meet Sgarbossa criteria.  Otherwise normal sinus rhythm.  Repeat EKG at 2 hours is largely consistent with the first 1.   RADIOLOGY Chest x-ray per my interpretation shows no acute pathology   PROCEDURES:  Critical Care performed: No  Procedures   MEDICATIONS ORDERED IN ED: Medications  0.9 %  sodium chloride infusion ( Intravenous New Bag/Given 09/03/22 0809)  nitroGLYCERIN 50 mg in dextrose 5 % 250 mL (0.2 mg/mL) infusion (20 mcg/min Intravenous New Bag/Given 09/03/22 1324)  heparin ADULT infusion 100 units/mL (25000 units/255mL) (950 Units/hr Intravenous New Bag/Given 09/03/22 1326)  sodium chloride 0.9 % bolus 1,000 mL (0 mLs Intravenous Stopped 09/03/22 1130)  nitroGLYCERIN (NITROSTAT) SL tablet 0.4 mg (0.4 mg Sublingual Given 09/03/22 0933)  morphine (PF) 4 MG/ML injection 4 mg (4 mg Intravenous Given 09/03/22 0953)  heparin bolus via infusion 4,000 Units (4,000 Units Intravenous Bolus from Bag 09/03/22 1326)     IMPRESSION / MDM / ASSESSMENT AND PLAN / ED COURSE  I reviewed the triage vital signs and the nursing notes.                              Differential diagnosis includes, but is not limited to, ACS, pneumonia, pneumothorax, less likely PE  Patient's presentation is most consistent with acute presentation with potential threat to life or bodily function.  Patient is a 57 year old male presenting today for chest pressure with new EKG findings.  Presentation initially concerning for STEMI but repeat EKG more reassuring.  Patient had elevated troponin at 85 initially with repeat of 75.  He had ongoing chest pain symptoms even after this.  Cardiology was consulted and recommends further evaluation with stress test.  Hospitalist was consulted and patient was started on heparin drip at this time.  Received aspirin with EMS and route.   Clinical Course as of 09/03/22 1355  Mon Sep 03, 2022  1610 STEMI alert called in the  field with EKG showing new left bundle branch block with some concern for possibly needing Sgarbossa criteria with crushing chest pain that did improve with nitro.  Evaluated by myself and cardiology on arrival with EKG for reassuring at this time but does show new left bundle branch block.  STEMI alert was canceled at this time.  Patient chest pain-free here after getting nitro. [DW]  0818 CBC with Differential/Platelet unremarkable [DW]  9604 My EKG interpretation at 7:59  AM: Rate of 103, sinus tachycardia with left bundle branch block not previously seen on EKGs.  Does not meet Sgarbossa criteria. [DW]  0848 Troponin I (High Sensitivity)(!): 85 [DW]  0906 DG Chest Portable 1 View Reviewed and interpreted chest x-ray with no acute cardiopulmonary pathology [DW]  [phone] Urinalysis, Routine w reflex microscopic -Urine, Clean Catch(!) No ketones [DW]  1006 No evidence of DKA at this time.  There is hyperglycemia with dehydration. [DW]  1031 Troponin I (High Sensitivity)(!): 75 [DW]  1236 Spoke with Dr. Juliann Pares.  Recommends inpatient admission for stress test and further cardiac evaluation. [DW]  1244 Will admit to hospitalist.  Patient received aspirin.  Cardiology still discussing whether to initiate heparin. [DW]  1310 Hospitalist recommended starting heparin for NSTEMI as well as a nitro drip to help with patient's chest pain. [DW]  1310 They will admit patient for further care. [DW]    Clinical Course User Index [DW] Janith Lima, MD     FINAL CLINICAL IMPRESSION(S) / ED DIAGNOSES   Final diagnoses:  NSTEMI (non-ST elevated myocardial infarction) [facility])     Rx / DC Orders   ED Discharge Orders     None        Note:  This document was prepared using Dragon voice recognition software and may include unintentional dictation errors.   Janith Lima, MD 09/03/22 1356

## 2022-09-03 NOTE — Consult Note (Signed)
ANTICOAGULATION CONSULT NOTE - Initial Consult  Pharmacy Consult for Heparin infusion  Indication: chest pain/ACS  No Known Allergies  Patient Measurements: Height: 5\' 9"  (175.3 cm) Weight: 80.3 kg (177 lb) IBW/kg (Calculated) : 70.7 Heparin Dosing Weight: 80.3 kg  Vital Signs: Temp: 97.7 F (36.5 C) (08/12 1042) Temp Source: Oral (08/12 1042) BP: 159/106 (08/12 1230) Pulse Rate: 115 (08/12 1230)  Labs: Recent Labs    09/03/22 0759 09/03/22 0954  HGB 16.6  --   HCT 49.2  --   PLT 338  --   APTT 38*  --   LABPROT 13.3  --   INR 1.0  --   CREATININE 1.03  --   TROPONINIHS 85* 75*    Estimated Creatinine Clearance: 79.1 mL/min (by C-G formula based on SCr of 1.03 mg/dL).   Medical History: Past Medical History:  Diagnosis Date   Coronary artery disease    Diabetes mellitus without complication (HCC)    GERD (gastroesophageal reflux disease)    Hypertension    Stroke (cerebrum) (HCC)     Medications:  (Not in a hospital admission)  Scheduled:   heparin  4,000 Units Intravenous Once   Infusions:   sodium chloride 20 mL/hr at 09/03/22 0809   nitroGLYCERIN     PRN:  Anti-infectives (From admission, onward)    None       Assessment: Pharmacy consulted to heparin for ACS. Pt has a hx of CVA, presented with chest pain x 2 days. Pain went to right arm.  EKG sinus rhythm with LBBB. NO DOAC PTA noted. Trop 75.    Goal of Therapy:  Heparin level 0.3-0.7 units/ml Monitor platelets by anticoagulation protocol: Yes   Plan:  Give 4000 units bolus x 1 Start heparin infusion at 950 units/hr Check anti-Xa level in 6 hours and daily while on heparin Continue to monitor H&H and platelets  Ronnald Ramp, PharmD, BCPS 09/03/2022,1:12 PM

## 2022-09-03 NOTE — ED Notes (Signed)
Pt endorses increase in pain, central chest and pain to right arm.

## 2022-09-03 NOTE — Assessment & Plan Note (Signed)
Blood sugar in 300s  SSI  A1C

## 2022-09-03 NOTE — ED Triage Notes (Signed)
Pt arrives via EMS from home with CP x2 days, 7/10. Hx of CVA with arm numbness since 2019. EMS gave 324 ASA and 1 spray of nitroglycerin with relief.

## 2022-09-03 NOTE — H&P (Signed)
History and Physical    Patient: Kenneth [name]:[id number] DOB: 06-05-1965 DOA: 09/03/2022 DOS: the patient was seen and examined on 09/03/2022 PCP: Anola Gurney, PA  Patient coming from: Home  Chief Complaint:  Chief Complaint  Patient presents with   Chest Pain   HPI: Kenneth [name] is a 57 y.o. male with medical history significant of hypertension, hyperlipidemia, tobacco abuse, coronary artery status post stenting, uncontrolled type 2 diabetes, history of CVA presenting with NSTEMI.  Patient reports recurrent chest pain over the past 2 days.  Chest pain has become progressively more severe over this timeframe.  Has had recurrent episodes both at rest as well as with exertion.  Central in nature.  No significant radiation up her neck or down the left arm.  Intensity has become more severe with pain intensity being 7-10 out of 10 over the past 12 hours.  Baseline 1-1/2 pack/day smoker.  Reports remote history of coronary artery disease status post stenting 20 years ago.  Type 2 diabetes with sugars in the 300s at home.  Denies any alcohol use.  No abdominal pain.  No hemiparesis or confusion.  No shortness of breath.  Was given aspirin nitroglycerin with some improvement of symptoms by EMS. Presents to the ER afebrile, heart rate 100s, blood pressure 110s to 160s over 70s to 100s.  Satting well on room air.  White count 10, hemoglobin 16.6, platelets 338, troponin 80s to 70s.  EKG sinus rhythm with left bundle branch block.  Per report, case discussed with Dr. Juliann Pares who plans for stress test in the morning.  Review of Systems: As mentioned in the history of present illness. All other systems reviewed and are negative. Past Medical History:  Diagnosis Date   Coronary artery disease    Diabetes mellitus without complication (HCC)    GERD (gastroesophageal reflux disease)    Hypertension    Stroke (cerebrum) (HCC)    Past Surgical History:  Procedure Laterality Date   CORONARY  ANGIOPLASTY WITH STENT PLACEMENT  06/18/2008   CORONARY ANGIOPLASTY WITH STENT PLACEMENT     SHOULDER ARTHROSCOPY WITH ROTATOR CUFF REPAIR AND SUBACROMIAL DECOMPRESSION Right 04/19/2017   Procedure: SHOULDER ARTHROSCOPY WITH ROTATOR CUFF REPAIR AND SUBACROMIAL DECOMPRESSION;  Surgeon: Signa Kell, MD;  Location: [facility] ORS;  Service: Orthopedics;  Laterality: Right;  distal clavical excision, bicepts tenotomy   Social History:  reports that he has been smoking cigarettes. His smokeless tobacco use includes chew. He reports that he does not drink alcohol and does not use drugs.  No Known Allergies  Family History  Problem Relation Age of Onset   Heart attack Mother    Heart failure Mother    Coronary artery disease Father    Diabetes Father     Prior to Admission medications   Medication Sig Start Date End Date Taking? Authorizing Provider  aspirin EC 81 MG tablet Take 324 mg by mouth daily.    [provider]  atorvastatin (LIPITOR) 80 MG tablet Take 1 tablet (80 mg total) by mouth daily at 6 PM. 02/27/17   Narda Bonds, MD  buPROPion [facility] SR) 100 MG 12 hr tablet Take 100 mg by mouth 2 (two) times daily.  06/29/15 04/12/17  [provider]  clopidogrel (PLAVIX) 75 MG tablet Take 1 tablet (75 mg total) by mouth daily. 02/28/17   Narda Bonds, MD  DULoxetine (CYMBALTA) 30 MG capsule Take 30 mg by mouth daily.    [provider]  gabapentin (NEURONTIN)  300 MG capsule Take 1 capsule (300 mg total) by mouth at bedtime. 05/22/17   Marvel Plan, MD  glimepiride (AMARYL) 4 MG tablet Take 1 tablet (4 mg total) by mouth daily with breakfast. 12/31/15   Gouru, Deanna Artis, MD  lisinopril (PRINIVIL,ZESTRIL) 40 MG tablet Take 1 tablet (40 mg total) by mouth daily. 02/27/17   Narda Bonds, MD  metFORMIN (GLUCOPHAGE) 1000 MG tablet TAKE 1 TABLET BY MOUTH TWICE DAILY WITH MEALS 05/17/17   Anola Gurney, PA  ondansetron (ZOFRAN ODT) 4 MG disintegrating tablet Take 1 tablet (4 mg  total) by mouth every 8 (eight) hours as needed for nausea or vomiting. 04/19/17   Signa Kell, MD    Physical Exam: Vitals:   09/03/22 1230 09/03/22 1330 09/03/22 1415 09/03/22 1430  BP: (!) 159/106 (!) 136/94 132/83 118/74  Pulse: (!) 115 (!) 109 96 (!) 101  Resp: (!) 24 (!) 27 20 (!) 21  Temp:      TempSrc:      SpO2: 92% 93% 94% 94%  Weight:      Height:       Physical Exam Constitutional:      Appearance: He is normal weight.  HENT:     Head: Normocephalic and atraumatic.     Nose: Nose normal.  Eyes:     Pupils: Pupils are equal, round, and reactive to light.  Cardiovascular:     Rate and Rhythm: Normal rate and regular rhythm.     Pulses: Normal pulses.  Pulmonary:     Effort: Pulmonary effort is normal.  Abdominal:     General: Bowel sounds are normal.  Musculoskeletal:     Cervical back: Normal range of motion.  Skin:    General: Skin is warm.  Neurological:     General: No focal deficit present.  Psychiatric:        Mood and Affect: Mood normal.     Data Reviewed:  There are no new results to review at this time. DG Chest Portable 1 View CLINICAL DATA:  Chest pain  EXAM: PORTABLE CHEST 1 VIEW  COMPARISON:  03/05/2017  FINDINGS: Monitor and defibrillator pads overlie the chest. Prominent heart size. Lungs remain clear. No focal pneumonia, edema, effusion or pneumothorax. Trachea midline. No osseous abnormality.  IMPRESSION: No acute chest process  Electronically Signed   By: Judie Petit.  Shick M.D.   On: 09/03/2022 09:00  Lab Results  Component Value Date   WBC 10.0 09/03/2022   HGB 16.6 09/03/2022   HCT 49.2 09/03/2022   MCV 87.2 09/03/2022   PLT 338 09/03/2022   Last metabolic panel Lab Results  Component Value Date   GLUCOSE 358 (H) 09/03/2022   NA 136 09/03/2022   K 4.4 09/03/2022   CL 103 09/03/2022   CO2 21 (L) 09/03/2022   BUN 19 09/03/2022   CREATININE 1.03 09/03/2022   GFRNONAA >60 09/03/2022   CALCIUM 9.3 09/03/2022   PHOS  4.4 02/26/2017   PROT 7.4 09/03/2022   ALBUMIN 3.9 09/03/2022   LABGLOB 2.4 07/30/2014   AGRATIO 1.9 07/30/2014   BILITOT 0.7 09/03/2022   ALKPHOS 80 09/03/2022   AST 14 (L) 09/03/2022   ALT 14 09/03/2022   ANIONGAP 12 09/03/2022    Assessment and Plan: * NSTEMI (non-ST elevated myocardial infarction) (HCC) Positive recurring central chest pain over the past 24 hours in the setting of prior history of coronary artery disease status post stenting roughly 20 years ago Concern for unstable angina  EKG sinus  tach w/ new LBBB  Trop in 70s  HEART Score around 6-7  Started on heparin and NTG in ER  Will continue  2D ECHO  Risk stratification labs  Discussed smoking cessation at length  Follow up formal cardiology recommendations     Uncontrolled type 2 diabetes mellitus with hyperglycemia (HCC) Blood sugar in 300s  SSI  A1C    History of CVA (cerebrovascular accident) 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 Observation status is judged to be reasonable and necessary in order to provide the required intensity of service to ensure the patient's safety. The patient's presenting symptoms, physical exam findings, and initial radiographic and laboratory data in the context of their medical condition is felt to place them at decreased risk for further clinical  deterioration. Furthermore, it is anticipated that the patient will be medically stable for discharge from the hospital within 2 midnights of admission.   Author: Floydene Flock, MD 09/03/2022 2:33 PM  For on call review [email or web address].

## 2022-09-03 NOTE — Assessment & Plan Note (Signed)
1 1/2 PPD smoker  Discussed cessation at length  Nicotine patch

## 2022-09-04 ENCOUNTER — Other Ambulatory Visit (HOSPITAL_COMMUNITY): Payer: Self-pay

## 2022-09-04 ENCOUNTER — Other Ambulatory Visit: Payer: Self-pay

## 2022-09-04 ENCOUNTER — Emergency Department: Payer: Self-pay

## 2022-09-04 ENCOUNTER — Observation Stay (HOSPITAL_COMMUNITY)
Admit: 2022-09-04 | Discharge: 2022-09-04 | Disposition: A | Discharge status: Home / Self Care | Payer: Self-pay | Attending: Family Medicine | Admitting: Family Medicine

## 2022-09-04 ENCOUNTER — Encounter
Admission: EM | Disposition: A | Discharge status: Home / Self Care | Payer: Self-pay | Source: Home / Self Care | Attending: Osteopathic Medicine

## 2022-09-04 DIAGNOSIS — I214 Non-ST elevation (NSTEMI) myocardial infarction: Secondary | ICD-10-CM | POA: Diagnosis not present

## 2022-09-04 HISTORY — PX: CORONARY STENT INTERVENTION: CATH118234

## 2022-09-04 HISTORY — PX: LEFT HEART CATH AND CORONARY ANGIOGRAPHY: CATH118249

## 2022-09-04 LAB — HEPARIN LEVEL (UNFRACTIONATED): Heparin Unfractionated: 0.42 IU/mL (ref 0.30–0.70)

## 2022-09-04 LAB — GLUCOSE, CAPILLARY
Glucose-Capillary: 263 mg/dL — ABNORMAL HIGH (ref 70–99)
Glucose-Capillary: 200 mg/dL — ABNORMAL HIGH (ref 70–99)
Glucose-Capillary: 243 mg/dL — ABNORMAL HIGH (ref 70–99)
Glucose-Capillary: 266 mg/dL — ABNORMAL HIGH (ref 70–99)

## 2022-09-04 LAB — POCT ACTIVATED CLOTTING TIME
Activated Clotting Time: 262 seconds
Activated Clotting Time: 348 seconds

## 2022-09-04 LAB — RESP PANEL BY RT-PCR (RSV, FLU A&B, COVID)  RVPGX2
Influenza A by PCR: NEGATIVE
Influenza B by PCR: NEGATIVE
Resp Syncytial Virus by PCR: NEGATIVE
SARS Coronavirus 2 by RT PCR: NEGATIVE

## 2022-09-04 LAB — TROPONIN I (HIGH SENSITIVITY): Troponin I (High Sensitivity): 1810 ng/L (ref <18)

## 2022-09-04 LAB — CARDIAC CATHETERIZATION: Cath EF Quantitative: 25 %

## 2022-09-04 SURGERY — LEFT HEART CATH AND CORONARY ANGIOGRAPHY
Anesthesia: Moderate Sedation

## 2022-09-04 MED ORDER — SODIUM CHLORIDE 0.9 % WEIGHT BASED INFUSION
3.0000 mL/kg/h | INTRAVENOUS | Status: DC
  Administered 2022-09-04: 3 mL/kg/h via INTRAVENOUS

## 2022-09-04 MED ORDER — FENTANYL CITRATE (PF) 100 MCG/2ML IJ SOLN
INTRAMUSCULAR | Status: DC | PRN
  Administered 2022-09-04 (×2): 25 ug via INTRAVENOUS

## 2022-09-04 MED ORDER — HEPARIN BOLUS VIA INFUSION
1200.0000 [IU] | Freq: Once | INTRAVENOUS | Status: AC
  Administered 2022-09-04: 1200 [IU] via INTRAVENOUS
  Filled 2022-09-04: qty 1200

## 2022-09-04 MED ORDER — ACETAMINOPHEN 325 MG PO TABS: 650.0000 mg | ORAL_TABLET | ORAL | Status: DC | PRN

## 2022-09-04 MED ORDER — NICOTINE 21 MG/24HR TD PT24
21.0000 mg | MEDICATED_PATCH | Freq: Every day | TRANSDERMAL | Status: DC
  Administered 2022-09-04 – 2022-09-05 (×2): 21 mg via TRANSDERMAL
  Filled 2022-09-04 (×2): qty 1

## 2022-09-04 MED ORDER — PERFLUTREN LIPID MICROSPHERE
1.0000 mL | INTRAVENOUS | Status: AC | PRN
  Administered 2022-09-04: 5 mL via INTRAVENOUS

## 2022-09-04 MED ORDER — HEPARIN SODIUM (PORCINE) 1000 UNIT/ML IJ SOLN
INTRAMUSCULAR | Status: AC
  Filled 2022-09-04: qty 10

## 2022-09-04 MED ORDER — SODIUM CHLORIDE 0.9 % IV SOLN: 250.0000 mL | INTRAVENOUS | Status: DC | PRN

## 2022-09-04 MED ORDER — MIDAZOLAM HCL 2 MG/2ML IJ SOLN
INTRAMUSCULAR | Status: DC | PRN
  Administered 2022-09-04 (×2): 1 mg via INTRAVENOUS

## 2022-09-04 MED ORDER — SODIUM CHLORIDE 0.9 % WEIGHT BASED INFUSION
1.0000 mL/kg/h | INTRAVENOUS | Status: DC
  Administered 2022-09-04: 1 mL/kg/h via INTRAVENOUS

## 2022-09-04 MED ORDER — ASPIRIN 81 MG PO CHEW
CHEWABLE_TABLET | ORAL | Status: DC | PRN
  Administered 2022-09-04: 324 mg via ORAL

## 2022-09-04 MED ORDER — VERAPAMIL HCL 2.5 MG/ML IV SOLN
INTRAVENOUS | Status: DC | PRN
  Administered 2022-09-04 (×2): 2.5 mg via INTRAVENOUS

## 2022-09-04 MED ORDER — TICAGRELOR 90 MG PO TABS
ORAL_TABLET | ORAL | Status: AC
  Filled 2022-09-04: qty 2

## 2022-09-04 MED ORDER — LORAZEPAM 2 MG/ML IJ SOLN: 1.0000 mg | Freq: Once | INTRAMUSCULAR | Status: DC | PRN

## 2022-09-04 MED ORDER — GUAIFENESIN-DM 100-10 MG/5ML PO SYRP
5.0000 mL | ORAL_SOLUTION | ORAL | Status: DC | PRN
  Filled 2022-09-04: qty 10

## 2022-09-04 MED ORDER — ASPIRIN 81 MG PO CHEW: 81.0000 mg | CHEWABLE_TABLET | Freq: Every day | ORAL | Status: DC

## 2022-09-04 MED ORDER — HEPARIN SODIUM (PORCINE) 1000 UNIT/ML IJ SOLN
INTRAMUSCULAR | Status: DC | PRN
  Administered 2022-09-04: 3000 [IU] via INTRAVENOUS
  Administered 2022-09-04: 6000 [IU] via INTRAVENOUS
  Administered 2022-09-04: 4000 [IU] via INTRAVENOUS

## 2022-09-04 MED ORDER — LORAZEPAM 0.5 MG PO TABS: 0.5000 mg | ORAL_TABLET | Freq: Four times a day (QID) | ORAL | Status: DC | PRN

## 2022-09-04 MED ORDER — VERAPAMIL HCL 2.5 MG/ML IV SOLN
INTRAVENOUS | Status: AC
  Filled 2022-09-04: qty 2

## 2022-09-04 MED ORDER — NITROGLYCERIN 1 MG/10 ML FOR IR/CATH LAB
INTRA_ARTERIAL | Status: DC | PRN
  Administered 2022-09-04: 200 ug

## 2022-09-04 MED ORDER — LABETALOL HCL 5 MG/ML IV SOLN: 10.0000 mg | INTRAVENOUS | Status: AC | PRN

## 2022-09-04 MED ORDER — PANTOPRAZOLE SODIUM 40 MG PO TBEC
40.0000 mg | DELAYED_RELEASE_TABLET | Freq: Every day | ORAL | Status: DC
  Administered 2022-09-04: 40 mg via ORAL
  Filled 2022-09-04 (×2): qty 1

## 2022-09-04 MED ORDER — HEPARIN (PORCINE) IN NACL 2000-0.9 UNIT/L-% IV SOLN
INTRAVENOUS | Status: DC | PRN
  Administered 2022-09-04: 1000 mL

## 2022-09-04 MED ORDER — LORAZEPAM 1 MG PO TABS
1.0000 mg | ORAL_TABLET | Freq: Four times a day (QID) | ORAL | Status: DC | PRN
  Filled 2022-09-04: qty 1

## 2022-09-04 MED ORDER — HYDRALAZINE HCL 20 MG/ML IJ SOLN: 10.0000 mg | INTRAMUSCULAR | Status: AC | PRN

## 2022-09-04 MED ORDER — LIDOCAINE HCL 1 % IJ SOLN
INTRAMUSCULAR | Status: AC
  Filled 2022-09-04: qty 20

## 2022-09-04 MED ORDER — SODIUM CHLORIDE 0.9% FLUSH: 3.0000 mL | INTRAVENOUS | Status: DC | PRN

## 2022-09-04 MED ORDER — TICAGRELOR 90 MG PO TABS
90.0000 mg | ORAL_TABLET | Freq: Two times a day (BID) | ORAL | Status: DC
  Administered 2022-09-04: 90 mg via ORAL
  Filled 2022-09-04: qty 1

## 2022-09-04 MED ORDER — TICAGRELOR 90 MG PO TABS
ORAL_TABLET | ORAL | Status: DC | PRN
  Administered 2022-09-04: 180 mg via ORAL

## 2022-09-04 MED ORDER — NITROGLYCERIN 2 % TD OINT
1.0000 [in_us] | TOPICAL_OINTMENT | Freq: Four times a day (QID) | TRANSDERMAL | Status: DC
  Administered 2022-09-04 – 2022-09-05 (×4): 1 [in_us] via TOPICAL
  Filled 2022-09-04 (×5): qty 1

## 2022-09-04 MED ORDER — SODIUM CHLORIDE 0.9% FLUSH
3.0000 mL | Freq: Two times a day (BID) | INTRAVENOUS | Status: DC
  Administered 2022-09-04: 3 mL via INTRAVENOUS

## 2022-09-04 MED ORDER — LIDOCAINE HCL (PF) 1 % IJ SOLN
INTRAMUSCULAR | Status: DC | PRN
  Administered 2022-09-04: 30 mL

## 2022-09-04 MED ORDER — HEPARIN (PORCINE) IN NACL 1000-0.9 UT/500ML-% IV SOLN
INTRAVENOUS | Status: AC
  Filled 2022-09-04: qty 1000

## 2022-09-04 MED ORDER — ASPIRIN 81 MG PO CHEW: 81.0000 mg | CHEWABLE_TABLET | ORAL | Status: DC

## 2022-09-04 MED ORDER — NITROGLYCERIN IN D5W 200-5 MCG/ML-% IV SOLN
INTRAVENOUS | Status: AC
  Filled 2022-09-04: qty 250

## 2022-09-04 MED ORDER — MIDAZOLAM HCL 2 MG/2ML IJ SOLN
INTRAMUSCULAR | Status: AC
  Filled 2022-09-04: qty 2

## 2022-09-04 MED ORDER — SODIUM CHLORIDE 0.9 % WEIGHT BASED INFUSION: 1.0000 mL/kg/h | INTRAVENOUS | Status: AC

## 2022-09-04 MED ORDER — ASPIRIN 81 MG PO CHEW
CHEWABLE_TABLET | ORAL | Status: AC
  Filled 2022-09-04: qty 4

## 2022-09-04 MED ORDER — IOHEXOL 300 MG/ML  SOLN
INTRAMUSCULAR | Status: DC | PRN
  Administered 2022-09-04: 220 mL

## 2022-09-04 MED ORDER — ONDANSETRON HCL 4 MG/2ML IJ SOLN: 4.0000 mg | Freq: Four times a day (QID) | INTRAMUSCULAR | Status: DC | PRN

## 2022-09-04 MED ORDER — FENTANYL CITRATE (PF) 100 MCG/2ML IJ SOLN
INTRAMUSCULAR | Status: AC
  Filled 2022-09-04: qty 2

## 2022-09-04 SURGICAL SUPPLY — 17 items
BALLN TREK RX 2.5X15 (BALLOONS) ×1
BALLOON TREK RX 2.5X15 (BALLOONS) IMPLANT
CATH 5FR JL3.5 JR4 ANG PIG MP (CATHETERS) IMPLANT
CATH VISTA GUIDE 6FR XB3.5 (CATHETERS) IMPLANT
DEVICE RAD TR BAND REGULAR (VASCULAR PRODUCTS) IMPLANT
DRAPE BRACHIAL (DRAPES) IMPLANT
GLIDESHEATH SLEND SS 6F .021 (SHEATH) IMPLANT
GUIDEWIRE INQWIRE 1.5J.035X260 (WIRE) IMPLANT
INQWIRE 1.5J .035X260CM (WIRE) ×1
KIT ENCORE 26 ADVANTAGE (KITS) IMPLANT
PACK CARDIAC CATH (CUSTOM PROCEDURE TRAY) ×1 IMPLANT
PROTECTION STATION PRESSURIZED (MISCELLANEOUS) ×1
SET ATX-X65L (MISCELLANEOUS) IMPLANT
STATION PROTECTION PRESSURIZED (MISCELLANEOUS) IMPLANT
STENT ONYX FRONTIER 2.5X15 (Permanent Stent) IMPLANT
TUBING CIL FLEX 10 FLL-RA (TUBING) IMPLANT
WIRE G HI TQ BMW 190 (WIRE) IMPLANT

## 2022-09-04 NOTE — Plan of Care (Signed)
  Problem: Education: Goal: Knowledge of General Education information will improve Description: Including pain rating scale, medication(s)/side effects and non-pharmacologic comfort measures Outcome: Progressing   Problem: Health Behavior/Discharge Planning: Goal: Ability to manage health-related needs will improve Outcome: Progressing   Problem: Activity: Goal: Risk for activity intolerance will decrease Outcome: Progressing   Problem: Nutrition: Goal: Adequate nutrition will be maintained Outcome: Progressing   Problem: Elimination: Goal: Will not experience complications related to bowel motility Outcome: Progressing Goal: Will not experience complications related to urinary retention Outcome: Progressing   Problem: Safety: Goal: Ability to remain free from injury will improve Outcome: Progressing   Problem: Coping: Goal: Level of anxiety will decrease Outcome: Not Progressing

## 2022-09-04 NOTE — Plan of Care (Signed)
  Problem: Education: Goal: Knowledge of General Education information will improve Description: Including pain rating scale, medication(s)/side effects and non-pharmacologic comfort measures Outcome: Progressing   Problem: Health Behavior/Discharge Planning: Goal: Ability to manage health-related needs will improve Outcome: Progressing   Problem: Clinical Measurements: Goal: Ability to maintain clinical measurements within normal limits will improve Outcome: Progressing Goal: Will remain free from infection Outcome: Progressing Goal: Diagnostic test results will improve Outcome: Progressing Goal: Respiratory complications will improve Outcome: Progressing Goal: Cardiovascular complication will be avoided Outcome: Progressing   Problem: Activity: Goal: Risk for activity intolerance will decrease Outcome: Progressing   Problem: Nutrition: Goal: Adequate nutrition will be maintained Outcome: Progressing   Problem: Coping: Goal: Level of anxiety will decrease Outcome: Progressing   Problem: Elimination: Goal: Will not experience complications related to bowel motility Outcome: Progressing Goal: Will not experience complications related to urinary retention Outcome: Progressing   Problem: Pain Managment: Goal: General experience of comfort will improve Outcome: Progressing   Problem: Safety: Goal: Ability to remain free from injury will improve Outcome: Progressing   Problem: Skin Integrity: Goal: Risk for impaired skin integrity will decrease Outcome: Progressing   Problem: Education: Goal: Understanding of cardiac disease, CV risk reduction, and recovery process will improve Outcome: Progressing Goal: Individualized Educational Video(s) Outcome: Progressing   Problem: Activity: Goal: Ability to tolerate increased activity will improve Outcome: Progressing   Problem: Cardiac: Goal: Ability to achieve and maintain adequate cardiovascular perfusion will  improve Outcome: Progressing   Problem: Health Behavior/Discharge Planning: Goal: Ability to safely manage health-related needs after discharge will improve Outcome: Progressing   Problem: Education: Goal: Ability to describe self-care measures that may prevent or decrease complications (Diabetes Survival Skills Education) will improve Outcome: Progressing Goal: Individualized Educational Video(s) Outcome: Progressing   Problem: Coping: Goal: Ability to adjust to condition or change in health will improve Outcome: Progressing   Problem: Fluid Volume: Goal: Ability to maintain a balanced intake and output will improve Outcome: Progressing   Problem: Health Behavior/Discharge Planning: Goal: Ability to identify and utilize available resources and services will improve Outcome: Progressing Goal: Ability to manage health-related needs will improve Outcome: Progressing   Problem: Metabolic: Goal: Ability to maintain appropriate glucose levels will improve Outcome: Progressing   Problem: Nutritional: Goal: Maintenance of adequate nutrition will improve Outcome: Progressing Goal: Progress toward achieving an optimal weight will improve Outcome: Progressing   Problem: Skin Integrity: Goal: Risk for impaired skin integrity will decrease Outcome: Progressing   Problem: Tissue Perfusion: Goal: Adequacy of tissue perfusion will improve Outcome: Progressing   Problem: Education: Goal: Understanding of CV disease, CV risk reduction, and recovery process will improve Outcome: Progressing Goal: Individualized Educational Video(s) Outcome: Progressing   Problem: Activity: Goal: Ability to return to baseline activity level will improve Outcome: Progressing   Problem: Cardiovascular: Goal: Ability to achieve and maintain adequate cardiovascular perfusion will improve Outcome: Progressing Goal: Vascular access site(s) Level 0-1 will be maintained Outcome: Progressing    Problem: Health Behavior/Discharge Planning: Goal: Ability to safely manage health-related needs after discharge will improve Outcome: Progressing

## 2022-09-04 NOTE — Progress Notes (Signed)
*  PRELIMINARY RESULTS* Echocardiogram 2D Echocardiogram has been performed.  Carolyne Fiscal 09/04/2022, 10:00 AM

## 2022-09-04 NOTE — Consult Note (Signed)
ANTICOAGULATION CONSULT NOTE - Initial Consult  Pharmacy Consult for Heparin infusion  Indication: chest pain/ACS  No Known Allergies  Patient Measurements: Height: 5\' 9"  (175.3 cm) Weight: 81.7 kg (180 lb 1.9 oz) IBW/kg (Calculated) : 70.7 Heparin Dosing Weight: 80.3 kg  Vital Signs: Temp: 98 F (36.7 C) (08/12 2343) Temp Source: Oral (08/12 2032) BP: 113/72 (08/12 2343) Pulse Rate: 98 (08/12 2343)  Labs: Recent Labs    09/03/22 0759 09/03/22 0954 09/03/22 1435 09/03/22 1637 09/03/22 2025 09/04/22 0034 09/04/22 0240  HGB 16.6  --   --   --   --   --  14.7  HCT 49.2  --   --   --   --   --  44.1  PLT 338  --   --   --   --   --  297  APTT 38*  --   --   --   --   --   --   LABPROT 13.3  --  14.1  --   --   --   --   INR 1.0  --  1.1  --   --   --   --   HEPARINUNFRC  --   --   --   --  0.14*  --  0.20*  CREATININE 1.03  --   --   --  1.02  --   --   TROPONINIHS 85*   < > 73* 102*  --  578*  --    < > = values in this interval not displayed.    Estimated Creatinine Clearance: 79.9 mL/min (by C-G formula based on SCr of 1.02 mg/dL).   Medical History: Past Medical History:  Diagnosis Date   Coronary artery disease    Diabetes mellitus without complication (HCC)    GERD (gastroesophageal reflux disease)    Hypertension    Stroke (cerebrum) (HCC)     Medications:  Medications Prior to Admission  Medication Sig Dispense Refill Last Dose   amLODipine (NORVASC) 10 MG tablet Take 1 tablet by mouth daily.      atorvastatin (LIPITOR) 80 MG tablet Take 1 tablet (80 mg total) by mouth daily at 6 PM. 30 tablet 0    bisoprolol-hydrochlorothiazide (ZIAC) 5-6.25 MG tablet Take 1 tablet by mouth daily.      glimepiride (AMARYL) 4 MG tablet Take 1 tablet (4 mg total) by mouth daily with breakfast. 30 tablet 0    metFORMIN (GLUCOPHAGE) 1000 MG tablet TAKE 1 TABLET BY MOUTH TWICE DAILY WITH MEALS 180 tablet 0    omeprazole (PRILOSEC) 20 MG capsule Take 20 mg by mouth daily.       spironolactone (ALDACTONE) 25 MG tablet Take 1 tablet by mouth daily.      aspirin EC 81 MG tablet Take 324 mg by mouth daily.      buPROPion (WELLBUTRIN SR) 100 MG 12 hr tablet Take 100 mg by mouth 2 (two) times daily.       clopidogrel (PLAVIX) 75 MG tablet Take 1 tablet (75 mg total) by mouth daily. 30 tablet 0    DULoxetine (CYMBALTA) 30 MG capsule Take 30 mg by mouth daily.      gabapentin (NEURONTIN) 300 MG capsule Take 1 capsule (300 mg total) by mouth at bedtime. 30 capsule 3    lisinopril (PRINIVIL,ZESTRIL) 40 MG tablet Take 1 tablet (40 mg total) by mouth daily. 30 tablet 0    ondansetron (ZOFRAN ODT) 4 MG disintegrating tablet Take  1 tablet (4 mg total) by mouth every 8 (eight) hours as needed for nausea or vomiting. 20 tablet 0    Scheduled:   aspirin EC  324 mg Oral Daily   atorvastatin  80 mg Oral q1800   heparin  1,200 Units Intravenous Once   insulin aspart  0-15 Units Subcutaneous TID WC   insulin aspart  0-5 Units Subcutaneous QHS   insulin aspart  4 Units Subcutaneous TID WC   insulin detemir  8 Units Subcutaneous Daily   lisinopril  40 mg Oral Daily   nitroGLYCERIN  1 inch Topical Q6H   Infusions:   sodium chloride 20 mL/hr at 09/03/22 2101   heparin 1,200 Units/hr (09/03/22 2100)   nitroGLYCERIN Stopped (09/04/22 0035)   PRN:  Anti-infectives (From admission, onward)    None       Assessment: Pharmacy consulted to heparin for ACS. Pt has a hx of CVA, presented with chest pain x 2 days. Pain went to right arm.  EKG sinus rhythm with LBBB. NO DOAC PTA noted. Trop 75.   8/12 2025  HL 0.14 8/13 0240  HL 0.20, SUBtherapeutic   Goal of Therapy:  Heparin level 0.3-0.7 units/ml Monitor platelets by anticoagulation protocol: Yes   Plan:  8/13:  HL 0240 = 0.20, SUBtherapeutic  - Will order heparin 1200 units IV X 1 bolus and increase drip rate to 1350 units/hr - Will recheck HL 6 hrs after start of drip  - Daily CBC while on Heparin  drip  Sher Hellinger D 09/04/2022 3:23 AM

## 2022-09-04 NOTE — Progress Notes (Signed)
PROGRESS NOTE    Kenneth [name]   UJW:[id number] DOB: [birth year]  DOA: 09/03/2022 Date of Service: 09/04/22 PCP: Anola Gurney, PA     Brief Narrative / Hospital Course:  Kenneth [name] is a 57 y.o. male with medical history significant of hypertension, hyperlipidemia, tobacco abuse, coronary artery status post stenting, uncontrolled type 2 diabetes, history of CVA presenting with chest pain x2 days 08/12: to ED, admitted to hospitalist w/ NSTEMI, cardiology to consult.  08/13: cardiac cath - stent to OM, chronic total occlusion RCA, expct ok for d/c in AM, will be on DAPT x1 year.    Consultants:  Cardiology   Procedures: 09/04/22 cardiac cath w/ stent to OM       ASSESSMENT & PLAN:   Principal Problem:   NSTEMI (non-ST elevated myocardial infarction) (HCC) Active Problems:   Uncontrolled type 2 diabetes mellitus with hyperglycemia (HCC)   Esophageal reflux   Hyperlipidemia   Hypertension   Numbness and tingling in left arm   Tobacco abuse counseling   History of CVA (cerebrovascular accident)   NSTEMI (non-ST elevated myocardial infarction) (HCC) CAD w/ Hx PCI  Unstable Angina  EKG sinus tach w/ new LBBB  HEART Score around 6-7  Cardiac cath --> stent to OM, chronic total occlusion RCA Started on heparin and NTG in ER  2D ECHO  Risk stratification labs  Discussed smoking cessation at length  expect ok for d/c in AM will be on DAPT x1 year Aggressive secondary prevention   Hyperlipidemia Lipid panel  High dose statin   Esophageal reflux PPI   Hypertension BP stable  Titrate home regimen   Uncontrolled type 2 diabetes mellitus with hyperglycemia (HCC) Blood sugar in 300s  SSI  A1C   Tobacco abuse counseling 1 1/2 PPD smoker  Discussed cessation at length  Nicotine patch   History of CVA (cerebrovascular accident) 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  Very agitated this morning prior to cardiac cath and also later this afternoon. C/o feel slike can't get a deep breath, no chest pain,       DVT prophylaxis: on heparin w/ NSTEMI Pertinent IV fluids/nutrition: no continuous IV fluids, NPO pending cardiac cath  Central lines / invasive devices: none   Code Status: FULL CODE  ACP documentation reviewed: 09/04/22 none on file [name]   Current Admission Status: inpatient   TOC needs / Dispo plan: med assistance  Barriers to discharge / significant pending items: cardiology clearance - expect is does well today can d/c tomorrow AM              Subjective / Brief ROS:  Patient reports feeling like he can't get a deep breath, coughing Denies CP Denies new weakness.  Tolerating diet  Reports no concerns w/ urination/defecation.   Family Communication: none at this time     Objective Findings:  Vitals:   09/04/22 1515 09/04/22 1530 09/04/22 1545 09/04/22 1603  BP: 105/69 117/77 122/85 122/78  Pulse: 82 89  (!) 104  Resp: (!) 26 (!) 26 20 15   Temp:    97.9 F (36.6 C)  TempSrc:      SpO2: 93% 94% 96% 99%  Weight:      Height:        Intake/Output Summary (Last 24 hours) at 09/04/2022 1709 Last data filed at 09/04/2022  0700 Gross per 24 hour  Intake 188.37 ml  Output 400 ml  Net -211.63 ml   Filed Weights   09/03/22 0801 09/04/22 0100  Weight: 80.3 kg 81.7 kg    Examination:  Physical Exam Constitutional:      General: He is not in acute distress.    Appearance: He is not ill-appearing.  Cardiovascular:     Rate and Rhythm: Regular rhythm. Tachycardia present.  Pulmonary:     Breath sounds: Normal breath sounds.  Musculoskeletal:     Right lower leg: No edema.     Left lower leg: No edema.  Skin:    General: Skin is warm and dry.  Neurological:     General: No focal  deficit present.     Mental Status: He is alert.  Psychiatric:        Mood and Affect: Mood is anxious.        Behavior: Behavior is agitated.          Scheduled Medications:   [START ON 09/05/2022] aspirin  81 mg Oral Daily   atorvastatin  80 mg Oral q1800   insulin aspart  0-15 Units Subcutaneous TID WC   insulin aspart  0-5 Units Subcutaneous QHS   insulin aspart  4 Units Subcutaneous TID WC   insulin detemir  8 Units Subcutaneous Daily   lisinopril  40 mg Oral Daily   nicotine  21 mg Transdermal Daily   nitroGLYCERIN  1 inch Topical Q6H   pantoprazole  40 mg Oral Daily   sodium chloride flush  3 mL Intravenous Q12H   ticagrelor  90 mg Oral BID    Continuous Infusions:  sodium chloride 20 mL/hr at 09/03/22 2101   sodium chloride     sodium chloride 1 mL/kg/hr (09/04/22 1408)    PRN Medications:  sodium chloride, acetaminophen, acetaminophen, guaiFENesin-dextromethorphan, hydrALAZINE, labetalol, LORazepam, LORazepam, morphine injection, ondansetron (ZOFRAN) IV, ondansetron (ZOFRAN) IV, sodium chloride flush  Antimicrobials from admission:  Anti-infectives (From admission, onward)    None           Data Reviewed:  I have personally reviewed the following...  CBC: Recent Labs  Lab 09/03/22 0759 09/04/22 0240  WBC 10.0 10.8*  NEUTROABS 6.5  --   HGB 16.6 14.7  HCT 49.2 44.1  MCV 87.2 88.0  PLT 338 297   Basic Metabolic Panel: Recent Labs  Lab 09/03/22 0759 09/03/22 2025  NA 136 137  K 4.4 3.8  CL 103 106  CO2 21* 23  GLUCOSE 358* 172*  BUN 19 19  CREATININE 1.03 1.02  CALCIUM 9.3 8.5*  MG  --  2.1   GFR: Estimated Creatinine Clearance: 79.9 mL/min (by C-G formula based on SCr of 1.02 mg/dL). Liver Function Tests: Recent Labs  Lab 09/03/22 0759  AST 14*  ALT 14  ALKPHOS 80  BILITOT 0.7  PROT 7.4  ALBUMIN 3.9   No results for input(s): "LIPASE", "AMYLASE" in the last 168 hours. No results for input(s): "AMMONIA" in the last 168  hours. Coagulation Profile: Recent Labs  Lab 09/03/22 0759 09/03/22 1435  INR 1.0 1.1   Cardiac Enzymes: No results for input(s): "CKTOTAL", "CKMB", "CKMBINDEX", "TROPONINI" in the last 168 hours. BNP (last 3 results) No results for input(s): "PROBNP" in the last 8760 hours. HbA1C: Recent Labs    09/03/22 0759  HGBA1C 11.4*   CBG: Recent Labs  Lab 09/03/22 1611 09/04/22 0747 09/04/22 1053 09/04/22 1606  GLUCAP 336* 263* 200* 243*  Lipid Profile: Recent Labs    09/03/22 0759  CHOL 200  HDL 27*  LDLCALC 109*  TRIG 319*  CHOLHDL 7.4   Thyroid Function Tests: No results for input(s): "TSH", "T4TOTAL", "FREET4", "T3FREE", "THYROIDAB" in the last 72 hours. Anemia Panel: No results for input(s): "VITAMINB12", "FOLATE", "FERRITIN", "TIBC", "IRON", "RETICCTPCT" in the last 72 hours. Most Recent Urinalysis On File:     Component Value Date/Time   COLORURINE YELLOW (A) 09/03/2022 0916   APPEARANCEUR CLEAR (A) 09/03/2022 0916   LABSPEC 1.025 09/03/2022 0916   PHURINE 5.0 09/03/2022 0916   GLUCOSEU >=500 (A) 09/03/2022 0916   HGBUR NEGATIVE 09/03/2022 0916   BILIRUBINUR NEGATIVE 09/03/2022 0916   KETONESUR NEGATIVE 09/03/2022 0916   PROTEINUR NEGATIVE 09/03/2022 0916   NITRITE NEGATIVE 09/03/2022 0916   LEUKOCYTESUR NEGATIVE 09/03/2022 0916   Sepsis Labs: @LABRCNTIP (procalcitonin:4,lacticidven:4) Microbiology: Recent Results (from the past 240 hour(s))  Resp panel by RT-PCR (RSV, Flu A&B, Covid) Anterior Nasal Swab     Status: None   Collection Time: 09/04/22 11:06 AM   Specimen: Anterior Nasal Swab  Result Value Ref Range Status   SARS Coronavirus 2 by RT PCR NEGATIVE NEGATIVE Final    Comment: (NOTE) SARS-CoV-2 target nucleic acids are NOT DETECTED.  The SARS-CoV-2 RNA is generally detectable in upper respiratory specimens during the acute phase of infection. The lowest concentration of SARS-CoV-2 viral copies this assay can detect is 138 copies/mL. A  negative result does not preclude SARS-Cov-2 infection and should not be used as the sole basis for treatment or other patient management decisions. A negative result may occur with  improper specimen collection/handling, submission of specimen other than nasopharyngeal swab, presence of viral mutation(s) within the areas targeted by this assay, and inadequate number of viral copies(<138 copies/mL). A negative result must be combined with clinical observations, patient history, and epidemiological information. The expected result is Negative.  Fact Sheet for Patients:  BloggerCourse.com  Fact Sheet for Healthcare Providers:  SeriousBroker.it  This test is no t yet approved or cleared by the [place] FDA and  has been authorized for detection and/or diagnosis of SARS-CoV-2 by FDA under an Emergency Use Authorization (EUA). This EUA will remain  in effect (meaning this test can be used) for the duration of the COVID-19 declaration under Section 564(b)(1) of the Act, 21 U.S.C.section 360bbb-3(b)(1), unless the authorization is terminated  or revoked sooner.       Influenza A by PCR NEGATIVE NEGATIVE Final   Influenza B by PCR NEGATIVE NEGATIVE Final    Comment: (NOTE) The Xpert Xpress SARS-CoV-2/FLU/RSV plus assay is intended as an aid in the diagnosis of influenza from Nasopharyngeal swab specimens and should not be used as a sole basis for treatment. Nasal washings and aspirates are unacceptable for Xpert Xpress SARS-CoV-2/FLU/RSV testing.  Fact Sheet for Patients: BloggerCourse.com  Fact Sheet for Healthcare Providers: SeriousBroker.it  This test is not yet approved or cleared by the [place] FDA and has been authorized for detection and/or diagnosis of SARS-CoV-2 by FDA under an Emergency Use Authorization (EUA). This EUA will remain in effect (meaning this test can  be used) for the duration of the COVID-19 declaration under Section 564(b)(1) of the Act, 21 U.S.C. section 360bbb-3(b)(1), unless the authorization is terminated or revoked.     Resp Syncytial Virus by PCR NEGATIVE NEGATIVE Final    Comment: (NOTE) Fact Sheet for Patients: BloggerCourse.com  Fact Sheet for Healthcare Providers: SeriousBroker.it  This test is not yet approved or cleared  by the [place] and has been authorized for detection and/or diagnosis of SARS-CoV-2 by FDA under an Emergency Use Authorization (EUA). This EUA will remain in effect (meaning this test can be used) for the duration of the COVID-19 declaration under Section 564(b)(1) of the Act, 21 U.S.C. section 360bbb-3(b)(1), unless the authorization is terminated or revoked.  Performed at [facility], [address] [place], [place] [address]       Radiology Studies last 3 days: ECHOCARDIOGRAM COMPLETE  Result Date: 09/04/2022    ECHOCARDIOGRAM REPORT   Patient Name:   Kenneth [name] Date of Exam: 09/04/2022 Medical Rec #:  [id number]    Height:       69.0 in Accession #:    [id number]   Weight:       180.1 lb Date of Birth:  Dec 27, 1965    BSA:          1.976 m Patient Age:    56 years     BP:           128/69 mmHg Patient Gender: M            HR:           88 bpm. Exam Location:  [facility] Procedure: 2D Echo, 3D Echo, Cardiac Doppler, Color Doppler, Strain Analysis and            Intracardiac Opacification Agent Indications:     Chest Pain  History:         Patient has prior history of Echocardiogram examinations, most                  recent 02/22/2017. CAD and Acute MI, Stroke and TIA,                  Signs/Symptoms:Chest Pain; Risk Factors:Hypertension, Diabetes,                  Dyslipidemia and Current Smoker.  Sonographer:     Mikki Harbor Referring Phys:  [phone] Francoise Schaumann NE[place]ON Diagnosing Phys: Alwyn Pea MD  Sonographer Comments: Global  longitudinal strain was attempted. IMPRESSIONS  1. Left ventricular ejection fraction, by estimation, is 20 to 25%. The left ventricle has severely decreased function. The left ventricle demonstrates global hypokinesis. The left ventricular internal cavity size was severely dilated. Left ventricular diastolic parameters are consistent with Grade II diastolic dysfunction (pseudonormalization).  2. Right ventricular systolic function is mildly reduced. The right ventricular size is moderately enlarged. There is moderately elevated pulmonary artery systolic pressure.  3. Left atrial size was mildly dilated.  4. Right atrial size was mildly dilated.  5. The mitral valve is normal in structure. Trivial mitral valve regurgitation.  6. The aortic valve is grossly normal. Aortic valve regurgitation is not visualized. Aortic valve sclerosis/calcification is present, without any evidence of aortic stenosis. FINDINGS  Left Ventricle: Left ventricular ejection fraction, by estimation, is 20 to 25%. The left ventricle has severely decreased function. The left ventricle demonstrates global hypokinesis. Definity contrast agent was given IV to delineate the left ventricular endocardial borders. The left ventricular internal cavity size was severely dilated. There is no left ventricular hypertrophy. Left ventricular diastolic parameters are consistent with Grade II diastolic dysfunction (pseudonormalization). Right Ventricle: The right ventricular size is moderately enlarged. No increase in right ventricular wall thickness. Right ventricular systolic function is mildly reduced. There is moderately elevated pulmonary artery systolic pressure. The tricuspid regurgitant velocity is 3.06 m/s, and with an assumed  right atrial pressure of 8 mmHg, the estimated right ventricular systolic pressure is 45.5 mmHg. Left Atrium: Left atrial size was mildly dilated. Right Atrium: Right atrial size was mildly dilated. Pericardium: There is no  evidence of pericardial effusion. Mitral Valve: The mitral valve is normal in structure. Trivial mitral valve regurgitation. MV peak gradient, 2.8 mmHg. The mean mitral valve gradient is 1.0 mmHg. Tricuspid Valve: The tricuspid valve is normal in structure. Tricuspid valve regurgitation is mild. Aortic Valve: The aortic valve is grossly normal. Aortic valve regurgitation is not visualized. Aortic valve sclerosis/calcification is present, without any evidence of aortic stenosis. Aortic valve mean gradient measures 2.0 mmHg. Aortic valve peak gradient measures 4.7 mmHg. Aortic valve area, by VTI measures 2.94 cm. Pulmonic Valve: The pulmonic valve was normal in structure. Pulmonic valve regurgitation is not visualized. Aorta: The ascending aorta was not well visualized. IAS/Shunts: No atrial level shunt detected by color flow Doppler.  LEFT VENTRICLE PLAX 2D LVIDd:         6.70 cm   Diastology LVIDs:         6.20 cm   LV e' medial:    5.00 cm/s LV PW:         1.10 cm   LV E/e' medial:  17.7 LV IVS:        1.20 cm   LV e' lateral:   7.51 cm/s LVOT diam:     2.00 cm   LV E/e' lateral: 11.8 LV SV:         56 LV SV Index:   28 LVOT Area:     3.14 cm  RIGHT VENTRICLE RV Basal diam:  2.95 cm RV Mid diam:    2.60 cm RV S prime:     12.60 cm/s TAPSE (M-mode): 2.6 cm LEFT ATRIUM           Index        RIGHT ATRIUM           Index LA diam:      4.40 cm 2.23 cm/m   RA Area:     12.80 cm LA Vol (A2C): 76.3 ml 38.61 ml/m  RA Volume:   30.00 ml  15.18 ml/m  AORTIC VALVE                    PULMONIC VALVE AV Area (Vmax):    2.86 cm     PV Vmax:       0.97 m/s AV Area (Vmean):   2.72 cm     PV Peak grad:  3.8 mmHg AV Area (VTI):     2.94 cm AV Vmax:           108.00 cm/s AV Vmean:          64.800 cm/s AV VTI:            0.189 m AV Peak Grad:      4.7 mmHg AV Mean Grad:      2.0 mmHg LVOT Vmax:         98.20 cm/s LVOT Vmean:        56.100 cm/s LVOT VTI:          0.177 m LVOT/AV VTI ratio: 0.94  AORTA Ao Root diam: 3.20 cm  MITRAL VALVE               TRICUSPID VALVE MV Area (PHT): 4.71 cm    TR Peak grad:   37.5 mmHg MV Area VTI:   2.71  cm    TR Vmax:        306.00 cm/s MV Peak grad:  2.8 mmHg MV Mean grad:  1.0 mmHg    SHUNTS MV Vmax:       0.83 m/s    Systemic VTI:  0.18 m MV Vmean:      49.1 cm/s   Systemic Diam: 2.00 cm MV Decel Time: 161 msec MV E velocity: 88.60 cm/s MV A velocity: 60.30 cm/s MV E/A ratio:  1.47 Alwyn Pea MD Electronically signed by Alwyn Pea MD Signature Date/Time: 09/04/2022/3:51:14 PM    Final    DG Chest Portable 1 View  Result Date: 09/03/2022 CLINICAL DATA:  Chest pain EXAM: PORTABLE CHEST 1 VIEW COMPARISON:  03/05/2017 FINDINGS: Monitor and defibrillator pads overlie the chest. Prominent heart size. Lungs remain clear. No focal pneumonia, edema, effusion or pneumothorax. Trachea midline. No osseous abnormality. IMPRESSION: No acute chest process Electronically Signed   By: Judie Petit.  Shick M.D.   On: 09/03/2022 09:00             LOS: 0 days        Sunnie Nielsen, DO [facility] 09/04/2022, 5:09 PM    Dictation software may have been used to generate the above note. Typos may occur and escape review in typed/dictated notes. Please contact Dr Lyn Hollingshead directly for clarity if needed.  Staff may message me via secure chat in Epic  but this may not receive an immediate response,  please page me for urgent matters!  If 7PM-7AM, please contact night coverage [email or web address]

## 2022-09-04 NOTE — Plan of Care (Signed)
  Problem: Education: Goal: Knowledge of General Education information will improve Description: Including pain rating scale, medication(s)/side effects and non-pharmacologic comfort measures Outcome: Progressing   Problem: Clinical Measurements: Goal: Will remain free from infection Outcome: Progressing   Problem: Nutrition: Goal: Adequate nutrition will be maintained Outcome: Progressing   Problem: Elimination: Goal: Will not experience complications related to bowel motility Outcome: Progressing Goal: Will not experience complications related to urinary retention Outcome: Progressing   Problem: Clinical Measurements: Goal: Respiratory complications will improve Outcome: Not Progressing   Problem: Activity: Goal: Risk for activity intolerance will decrease Outcome: Not Progressing   Problem: Coping: Goal: Level of anxiety will decrease Outcome: Not Progressing   Problem: Pain Managment: Goal: General experience of comfort will improve Outcome: Not Progressing

## 2022-09-04 NOTE — Hospital Course (Addendum)
Kenneth [name] is a 57 y.o. male with medical history significant of hypertension, hyperlipidemia, tobacco abuse, coronary artery status post stenting, uncontrolled type 2 diabetes, history of CVA presenting with chest pain x2 days 08/12: to ED, admitted to hospitalist w/ NSTEMI, cardiology to consult.  08/13: cardiac cath - stent to OM, chronic total occlusion RCA, expct ok for d/c in AM, will be on DAPT x1 year.    Consultants:  Cardiology   Procedures: 09/04/22 cardiac cath w/ stent to OM       ASSESSMENT & PLAN:   Principal Problem:   NSTEMI (non-ST elevated myocardial infarction) (HCC) Active Problems:   Uncontrolled type 2 diabetes mellitus with hyperglycemia (HCC)   Esophageal reflux   Hyperlipidemia   Hypertension   Numbness and tingling in left arm   Tobacco abuse counseling   History of CVA (cerebrovascular accident)   NSTEMI (non-ST elevated myocardial infarction) (HCC) CAD w/ Hx PCI  Unstable Angina  EKG sinus tach w/ new LBBB  HEART Score around 6-7  Cardiac cath --> stent to OM, chronic total occlusion RCA Started on heparin and NTG in ER  2D ECHO  Risk stratification labs  Discussed smoking cessation at length  expect ok for d/c in AM will be on DAPT x1 year Aggressive secondary prevention   Hyperlipidemia Lipid panel  High dose statin   Esophageal reflux PPI   Hypertension BP stable  Titrate home regimen   Uncontrolled type 2 diabetes mellitus with hyperglycemia (HCC) Blood sugar in 300s  SSI  A1C   Tobacco abuse counseling 1 1/2 PPD smoker  Discussed cessation at length  Nicotine patch   History of CVA (cerebrovascular accident) Noted 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  Very agitated this morning prior to cardiac cath and  also later this afternoon. C/o feel slike can't get a deep breath, no chest pain,       DVT prophylaxis: on heparin w/ NSTEMI Pertinent IV fluids/nutrition: no continuous IV fluids, NPO pending cardiac cath  Central lines / invasive devices: none   Code Status: FULL CODE  ACP documentation reviewed: 09/04/22 none on file [name]   Current Admission Status: inpatient   TOC needs / Dispo plan: med assistance  Barriers to discharge / significant pending items: cardiology clearance - expect is does well today can d/c tomorrow AM

## 2022-09-04 NOTE — Progress Notes (Signed)
ANTICOAGULATION CONSULT NOTE - Initial Consult  Pharmacy Consult for Heparin infusion  Indication: chest pain/ACS  No Known Allergies  Patient Measurements: Height: 5\' 9"  (175.3 cm) Weight: 81.7 kg (180 lb 1.9 oz) IBW/kg (Calculated) : 70.7 Heparin Dosing Weight: 80.3 kg  Vital Signs: Temp: 97.3 F (36.3 C) (08/13 0746) Temp Source: Oral (08/13 0333) BP: 128/69 (08/13 0746) Pulse Rate: 75 (08/13 0746)  Labs: Recent Labs    09/03/22 0759 09/03/22 0954 09/03/22 1435 09/03/22 1637 09/03/22 2025 09/04/22 0034 09/04/22 0240 09/04/22 0928  HGB 16.6  --   --   --   --   --  14.7  --   HCT 49.2  --   --   --   --   --  44.1  --   PLT 338  --   --   --   --   --  297  --   APTT 38*  --   --   --   --   --   --   --   LABPROT 13.3  --  14.1  --   --   --   --   --   INR 1.0  --  1.1  --   --   --   --   --   HEPARINUNFRC  --   --   --   --  0.14*  --  0.20* 0.42  CREATININE 1.03  --   --   --  1.02  --   --   --   TROPONINIHS 85*   < > 73* 102*  --  578* 723*  --    < > = values in this interval not displayed.    Estimated Creatinine Clearance: 79.9 mL/min (by C-G formula based on SCr of 1.02 mg/dL).   Medical History: Past Medical History:  Diagnosis Date   Coronary artery disease    Diabetes mellitus without complication (HCC)    GERD (gastroesophageal reflux disease)    Hypertension    Stroke (cerebrum) (HCC)     Medications:  Medications Prior to Admission  Medication Sig Dispense Refill Last Dose   amLODipine (NORVASC) 10 MG tablet Take 1 tablet by mouth daily.   08/21/2022   atorvastatin (LIPITOR) 80 MG tablet Take 1 tablet (80 mg total) by mouth daily at 6 PM. 30 tablet 0 08/21/2022   bisoprolol-hydrochlorothiazide (ZIAC) 5-6.25 MG tablet Take 1 tablet by mouth daily.   08/21/2022   glimepiride (AMARYL) 4 MG tablet Take 1 tablet (4 mg total) by mouth daily with breakfast. 30 tablet 0 08/21/2022   metFORMIN (GLUCOPHAGE) 1000 MG tablet TAKE 1 TABLET BY MOUTH  TWICE DAILY WITH MEALS 180 tablet 0 08/21/2022   omeprazole (PRILOSEC) 20 MG capsule Take 20 mg by mouth daily.   08/21/2022   spironolactone (ALDACTONE) 25 MG tablet Take 1 tablet by mouth daily.   08/21/2022   aspirin EC 81 MG tablet Take 324 mg by mouth daily.   08/21/2022   buPROPion (WELLBUTRIN SR) 100 MG 12 hr tablet Take 100 mg by mouth 2 (two) times daily.       clopidogrel (PLAVIX) 75 MG tablet Take 1 tablet (75 mg total) by mouth daily. (Patient not taking: Reported on 09/04/2022) 30 tablet 0 Not Taking   DULoxetine (CYMBALTA) 30 MG capsule Take 30 mg by mouth daily. (Patient not taking: Reported on 09/04/2022)   Not Taking   gabapentin (NEURONTIN) 300 MG capsule Take 1 capsule (300 mg  total) by mouth at bedtime. (Patient not taking: Reported on 09/04/2022) 30 capsule 3 Not Taking   lisinopril (PRINIVIL,ZESTRIL) 40 MG tablet Take 1 tablet (40 mg total) by mouth daily. (Patient not taking: Reported on 09/04/2022) 30 tablet 0 Not Taking   ondansetron (ZOFRAN ODT) 4 MG disintegrating tablet Take 1 tablet (4 mg total) by mouth every 8 (eight) hours as needed for nausea or vomiting. (Patient not taking: Reported on 09/04/2022) 20 tablet 0 Not Taking   Scheduled:   [START ON 09/05/2022] aspirin  81 mg Oral Pre-Cath   atorvastatin  80 mg Oral q1800   insulin aspart  0-15 Units Subcutaneous TID WC   insulin aspart  0-5 Units Subcutaneous QHS   insulin aspart  4 Units Subcutaneous TID WC   insulin detemir  8 Units Subcutaneous Daily   lisinopril  40 mg Oral Daily   nitroGLYCERIN  1 inch Topical Q6H   pantoprazole  40 mg Oral Daily   Infusions:   sodium chloride 20 mL/hr at 09/03/22 2101   [START ON 09/05/2022] sodium chloride     Followed by   Melene Muller ON 09/05/2022] sodium chloride     heparin 1,350 Units/hr (09/04/22 0333)   nitroGLYCERIN Stopped (09/04/22 0035)   PRN:  Anti-infectives (From admission, onward)    None       Assessment: Pharmacy consulted to heparin for ACS. Pt has a hx of  CVA, presented with chest pain x 2 days. Pain went to right arm.  EKG sinus rhythm with LBBB. NO DOAC PTA noted. Trop 75.  Non-compliant with home meds  Goal of Therapy:  Heparin level 0.3-0.7 units/ml Monitor platelets by anticoagulation protocol: Yes   8/12 2025  HL 0.14 8/13 0240  HL 0.20, SUBtherapeutic  8/13 0928  HL 0.42, therapeutic   Plan: HL therapeutic at 0.42 Will continue heparin infusion at 1350 units/hr Repeat  HL 6 hrs to confirm therapeutic rate Daily CBC while on Heparin drip  Johnn Krasowski Rodriguez-Guzman PharmD, BCPS 09/04/2022 10:31 AM

## 2022-09-05 ENCOUNTER — Other Ambulatory Visit (HOSPITAL_COMMUNITY): Payer: Self-pay

## 2022-09-05 ENCOUNTER — Encounter: Payer: Self-pay | Admitting: Internal Medicine

## 2022-09-05 ENCOUNTER — Other Ambulatory Visit: Payer: Self-pay

## 2022-09-05 LAB — GLUCOSE, CAPILLARY: Glucose-Capillary: 319 mg/dL — ABNORMAL HIGH (ref 70–99)

## 2022-09-05 MED ORDER — CLOPIDOGREL BISULFATE 75 MG PO TABS: 75.0000 mg | ORAL_TABLET | Freq: Every day | ORAL | Status: DC

## 2022-09-05 MED ORDER — DAPAGLIFLOZIN PROPANEDIOL 10 MG PO TABS
10.0000 mg | ORAL_TABLET | Freq: Every day | ORAL | Status: DC
  Administered 2022-09-05: 10 mg via ORAL
  Filled 2022-09-05: qty 1

## 2022-09-05 MED ORDER — DAPAGLIFLOZIN PROPANEDIOL 10 MG PO TABS
10.0000 mg | ORAL_TABLET | Freq: Every day | ORAL | 0 refills | Status: AC
  Filled 2022-09-05: qty 30, 30d supply, fill #0
  Filled 2022-10-15: qty 30, 30d supply, fill #1

## 2022-09-05 MED ORDER — CLOPIDOGREL BISULFATE 75 MG PO TABS
300.0000 mg | ORAL_TABLET | Freq: Every day | ORAL | Status: DC
  Administered 2022-09-05: 300 mg via ORAL
  Filled 2022-09-05: qty 4

## 2022-09-05 MED ORDER — SPIRONOLACTONE 12.5 MG HALF TABLET
12.5000 mg | ORAL_TABLET | Freq: Every day | ORAL | Status: DC
  Administered 2022-09-05: 12.5 mg via ORAL
  Filled 2022-09-05: qty 1

## 2022-09-05 MED ORDER — NICOTINE 21 MG/24HR TD PT24
21.0000 mg | MEDICATED_PATCH | Freq: Every day | TRANSDERMAL | 0 refills | Status: DC
  Filled 2022-09-05: qty 28, 28d supply, fill #0

## 2022-09-05 MED ORDER — ATORVASTATIN CALCIUM 80 MG PO TABS: 80.0000 mg | ORAL_TABLET | Freq: Every day | ORAL | 0 refills | Status: DC

## 2022-09-05 MED ORDER — SPIRONOLACTONE 25 MG PO TABS
12.5000 mg | ORAL_TABLET | Freq: Every day | ORAL | 0 refills | Status: DC
  Filled 2022-09-05: qty 45, 90d supply, fill #0

## 2022-09-05 MED ORDER — METOPROLOL SUCCINATE ER 25 MG PO TB24
12.5000 mg | ORAL_TABLET | Freq: Every day | ORAL | 0 refills | Status: DC
  Filled 2022-09-05: qty 45, 90d supply, fill #0

## 2022-09-05 MED ORDER — LISINOPRIL 40 MG PO TABS
40.0000 mg | ORAL_TABLET | Freq: Every day | ORAL | 0 refills | Status: DC
  Filled 2022-09-05: qty 90, 90d supply, fill #0

## 2022-09-05 MED ORDER — METOPROLOL SUCCINATE ER 25 MG PO TB24
12.5000 mg | ORAL_TABLET | Freq: Every day | ORAL | Status: DC
  Administered 2022-09-05: 12.5 mg via ORAL
  Filled 2022-09-05: qty 1

## 2022-09-05 MED ORDER — MORPHINE SULFATE (PF) 2 MG/ML IV SOLN: 2.0000 mg | INTRAVENOUS | Status: DC | PRN

## 2022-09-05 MED ORDER — ASPIRIN 81 MG PO TBEC
81.0000 mg | DELAYED_RELEASE_TABLET | Freq: Every day | ORAL | Status: DC
  Administered 2022-09-05: 81 mg via ORAL
  Filled 2022-09-05: qty 1

## 2022-09-05 MED ORDER — CLOPIDOGREL BISULFATE 75 MG PO TABS
75.0000 mg | ORAL_TABLET | Freq: Every day | ORAL | 0 refills | Status: AC
  Filled 2022-09-05: qty 90, 90d supply, fill #0

## 2022-09-05 MED ORDER — ASPIRIN 81 MG PO TBEC
81.0000 mg | DELAYED_RELEASE_TABLET | Freq: Every day | ORAL | 0 refills | Status: AC
  Filled 2022-09-05: qty 90, 90d supply, fill #0

## 2022-09-05 NOTE — Progress Notes (Signed)
Order to discharge pt home.  Discharge instructions/AVS given to patient and reviewed - education provided as needed.  Pt advised to call PCP and/or come back to the hospital if there are any problems. Pt verbalized understanding.    Pt and spouse requesting fort omeprazole to be re-started - MD order okay to continue.  Pt and spouse aware.

## 2022-09-05 NOTE — Progress Notes (Signed)
[facility] HF Stewardship  PCP: Anola Gurney, PA  PCP-Cardiologist: None  HPI: Kenneth [name] is a 57 y.o. male with HTN, HLD, tobacco use, T2DM, CVA, CAD who presented with chest pain. Echo in 2017 showed LVEF 30-35% with mild MR. LVEF improved to 35-40% in 2019. 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 Echo on 09/04/22 showed LVEF down to 20-25% with grade II diastolic dysfunction and mildly reduced RV function.  Pertinent Lab Values: BUN  Date Value Ref Range Status  09/05/2022 15 6 - 20 mg/dL Final  16/10/9602 8 6 - 24 mg/dL Final  54/09/8117 13 7 - 18 mg/dL Final   Potassium  Date Value Ref Range Status  09/05/2022 3.6 3.5 - 5.1 mmol/L Final  07/15/2013 3.5 3.5 - 5.1 mmol/L Final   Sodium  Date Value Ref Range Status  09/05/2022 136 135 - 145 mmol/L Final  07/30/2014 142 134 - 144 mmol/L Final  07/15/2013 133 (L) 136 - 145 mmol/L Final   Magnesium  Date Value Ref Range Status  09/03/2022 2.1 1.7 - 2.4 mg/dL Final    Comment:    Performed at [facility], [address] [place], [place] [address]   TSH  Date Value Ref Range Status  02/20/2017 0.765 0.350 - 4.500 uIU/mL Final    Comment:    Performed by a 3rd Generation assay with a functional sensitivity of <=0.01 uIU/mL.    Vital Signs: Temp:  [97.6 F (36.4 C)-98.2 F (36.8 C)] 97.8 F (36.6 C) (08/14 0808) Pulse Rate:  [76-118] 93 (08/14 0808) Cardiac Rhythm: Sinus tachycardia;Bundle branch block (08/14 0853) Resp:  [13-26] 20 (08/14 0808) BP: (102-141)/(58-95) 112/68 (08/14 0808) SpO2:  [93 %-99 %] 96 % (08/14 0808) Weight:  [77.1 kg (169 lb 15.6 oz)] 77.1 kg (169 lb 15.6 oz) (08/14 0534)   Intake/Output Summary (Last 24 hours) at 09/05/2022 1033 Last data filed at 09/05/2022 0335 Gross per 24 hour  Intake 2040.79 ml  Output 1750 ml  Net 290.79 ml    Current Inpatient  Medications:    Beta Blocker: Bisoprolol 5 mg daily Angiotensin-Converting Enzyme Inhibitor (ACEi) or Angiotensin II Receptor Blocker (ARB): Lisinopril 40 mg daily Mineralocorticoid Receptor Antagonist (MRA): Spironolactone 25 mg daily Sodium-glucose Cotransporter-2 Inhibitors (SGLT2i): Farxiga 10 mg daily   Prior to admission HF Medications:  Bisoprolol 5 mg daily Angiotensin-Converting Enzyme Inhibitor (ACEi) or Angiotensin II Receptor Blocker (ARB): Lisinopril 40 mg daily   Assessment: 1. 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 NYHA class II symptoms.  -Patient feeling improved today and eager to leave. Still with some dry cough. Creatinine stable after cath. BP stable on current meds.   Plan: 1) Medication changes recommended at this time: -Assisted with discharge medications and scheduled patient for outpatient HF clinic with APP.   2) Patient assistance: -Farxiga patient assistance initiated by med management clinic  3) Education: -- Patient has been educated on current HF medications and potential additions to HF medication regimen - Patient verbalizes understanding that over the next few months, these medication doses may change and more medications may be added to optimize HF regimen - Patient has been educated on basic disease state pathophysiology and goals of therapy  Medication Assistance / Insurance Benefits Check:  Does the patient have prescription insurance?    Type of insurance plan:   Does the patient qualify  for medication assistance through manufacturers or grants? Yes   Eligible grants and/or patient assistance programs: Farxiga   Medication assistance applications in progress: Farxiga   Medication assistance applications approved: pending  Approved medication assistance renewals will be completed by: Med Management clinic  Outpatient Pharmacy:  Prior to admission outpatient pharmacy: [facility]  Is the  patient agreeable to switch to [facility]?: Yes  Is the patient willing to utilize a [facility] pharmacy at discharge?: Yes  Thank you for involving HF Navigation team in this patient's care.  Enos Fling, PharmD, BCPS Clinical Pharmacist 09/05/2022 10:37 AM

## 2022-09-05 NOTE — Progress Notes (Addendum)
[name] CONSULT [facility]       Patient ID: [name] MRN: [id number] DOB/AGE: [birth year] 57 y.o.  Admit date: 09/03/2022 Referring Physician Dr. Doree Albee Primary Physician Dr. Aram Beecham Primary Cardiologist Previous Dr. Gwen Pounds  Reason for Consultation NSTEMI  HPI: [name]. Schellenberg is a 57yoM with a PMH of CAD s/p PCI to RCA (~2000s), uncontrolled DM2 (A1c 11%), HLD, HTN, hx CVA, ongoing tobacco use who presented to [facility] ED 09/03/2022 with 2 days of progressively worsening chest pain at rest and with exertion.  EKG showed sinus rhythm with a new LBBB on admission.  Troponins trended 85, 75, 73, 102, 578, 723, 1810, ruling in NSTEMI.  LHC performed 8/13 revealed 100% occluded OM1 s/p successful PCI with DES and 100% proximal CTO of RCA previously placed stent, managed medically.  Echo showed newly reduced EF to 20-25%, global HK and severe dilation, G2 DD, trivial MR.  Interval history: -Seen and examined this morning with wife at bedside -Slept well last night, no recurrent chest pain -No shortness of breath or other complaints, right wrist without swelling, oozing, or bleeding   Review of systems complete and found to be negative unless listed above     Past Medical History:  Diagnosis Date   Coronary artery disease    Diabetes mellitus without complication (HCC)    GERD (gastroesophageal reflux disease)    Hypertension    Stroke (cerebrum) (HCC)     Past Surgical History:  Procedure Laterality Date   CORONARY ANGIOPLASTY WITH STENT PLACEMENT  06/18/2008   CORONARY ANGIOPLASTY WITH STENT PLACEMENT     CORONARY STENT INTERVENTION N/A 09/04/2022   Procedure: CORONARY STENT INTERVENTION;  Surgeon: Alwyn Pea, MD;  Location: [facility] INVASIVE CV LAB;  Service: Cardiovascular;  Laterality: N/A;   LEFT HEART CATH AND CORONARY ANGIOGRAPHY N/A 09/04/2022   Procedure: LEFT HEART CATH AND CORONARY ANGIOGRAPHY;  Surgeon: Alwyn Pea, MD;  Location: [facility]  INVASIVE CV LAB;  Service: Cardiovascular;  Laterality: N/A;   SHOULDER ARTHROSCOPY WITH ROTATOR CUFF REPAIR AND SUBACROMIAL DECOMPRESSION Right 04/19/2017   Procedure: SHOULDER ARTHROSCOPY WITH ROTATOR CUFF REPAIR AND SUBACROMIAL DECOMPRESSION;  Surgeon: Signa Kell, MD;  Location: [facility] ORS;  Service: Orthopedics;  Laterality: Right;  distal clavical excision, bicepts tenotomy    Medications Prior to Admission  Medication Sig Dispense Refill Last Dose   amLODipine (NORVASC) 10 MG tablet Take 1 tablet by mouth daily.   08/21/2022   atorvastatin (LIPITOR) 80 MG tablet Take 1 tablet (80 mg total) by mouth daily at 6 PM. 30 tablet 0 08/21/2022   bisoprolol-hydrochlorothiazide (ZIAC) 5-6.25 MG tablet Take 1 tablet by mouth daily.   08/21/2022   glimepiride (AMARYL) 4 MG tablet Take 1 tablet (4 mg total) by mouth daily with breakfast. 30 tablet 0 08/21/2022   metFORMIN (GLUCOPHAGE) 1000 MG tablet TAKE 1 TABLET BY MOUTH TWICE DAILY WITH MEALS 180 tablet 0 08/21/2022   omeprazole (PRILOSEC) 20 MG capsule Take 20 mg by mouth daily.   08/21/2022   spironolactone (ALDACTONE) 25 MG tablet Take 1 tablet by mouth daily.   08/21/2022   aspirin EC 81 MG tablet Take 324 mg by mouth daily.   08/21/2022   buPROPion (WELLBUTRIN SR) 100 MG 12 hr tablet Take 100 mg by mouth 2 (two) times daily.       clopidogrel (PLAVIX) 75 MG tablet Take 1 tablet (75 mg total) by mouth daily. (Patient not taking: Reported on 09/04/2022) 30 tablet 0 Not Taking  DULoxetine (CYMBALTA) 30 MG capsule Take 30 mg by mouth daily. (Patient not taking: Reported on 09/04/2022)   Not Taking   gabapentin (NEURONTIN) 300 MG capsule Take 1 capsule (300 mg total) by mouth at bedtime. (Patient not taking: Reported on 09/04/2022) 30 capsule 3 Not Taking   lisinopril (PRINIVIL,ZESTRIL) 40 MG tablet Take 1 tablet (40 mg total) by mouth daily. (Patient not taking: Reported on 09/04/2022) 30 tablet 0 Not Taking   ondansetron (ZOFRAN ODT) 4 MG disintegrating tablet  Take 1 tablet (4 mg total) by mouth every 8 (eight) hours as needed for nausea or vomiting. (Patient not taking: Reported on 09/04/2022) 20 tablet 0 Not Taking   Social History   Socioeconomic History   Marital status: Married    Spouse name: Not on file   Number of children: Not on file   Years of education: Not on file   Highest education level: Not on file  Occupational History   Not on file  Tobacco Use   Smoking status: Heavy Smoker    Current packs/day: 1.50    Types: Cigarettes   Smokeless tobacco: Current    Types: Chew   Tobacco comments:    on his own terms  Vaping Use   Vaping status: Never Used  Substance and Sexual Activity   Alcohol use: No    Alcohol/week: 0.0 standard drinks of alcohol    Comment: OCCASIONALLY   Drug use: No   Sexual activity: Not on file  Other Topics Concern   Not on file  Social History Narrative   Not on file   Social Determinants of Health   Financial Resource Strain: Not on file  Food Insecurity: No Food Insecurity (09/03/2022)   Hunger Vital Sign    Worried About Running Out of Food in the Last Year: Never true    Ran Out of Food in the Last Year: Never true  Transportation Needs: No Transportation Needs (09/03/2022)   PRAPARE - Administrator, Civil Service (Medical): No    Lack of Transportation (Non-Medical): No  Physical Activity: Not on file  Stress: Not on file  Social Connections: Not on file  Intimate Partner Violence: Not At Risk (09/03/2022)   Humiliation, Afraid, Rape, and Kick questionnaire    Fear of Current or Ex-Partner: No    Emotionally Abused: No    Physically Abused: No    Sexually Abused: No    Family History  Problem Relation Age of Onset   Heart attack Mother    Heart failure Mother    Coronary artery disease Father    Diabetes Father       Intake/Output Summary (Last 24 hours) at 09/05/2022 0823 Last data filed at 09/05/2022 0335 Gross per 24 hour  Intake 2040.79 ml  Output 1750 ml   Net 290.79 ml    Vitals:   09/04/22 2329 09/05/22 0334 09/05/22 0534 09/05/22 0808  BP: 111/66 123/87  112/68  Pulse: (!) 105 91  93  Resp: 15 18  20   Temp: 98.2 F (36.8 C) 97.6 F (36.4 C)  97.8 F (36.6 C)  TempSrc: Oral Oral  Oral  SpO2: 98% 98%  96%  Weight:   77.1 kg   Height:        PHYSICAL EXAM General: Middle-age male, well nourished, in no acute distress.  Sitting up in bed with wife at bedside HEENT:  Normocephalic and atraumatic. Neck:  No JVD.  Lungs: Normal respiratory effort on room air.  Decreased  breath sounds without appreciable crackles or wheezes.   Heart: HRRR . Normal S1 and S2 without gallops or murmurs.  Abdomen: Non-distended appearing.  Msk: Normal strength and tone for age. Extremities: Right wrist with healed arteriotomy, radial pulses 2+ proximally and distally without swelling, tenderness to palpation, or apparent aneurysm.  Warm and well perfused. No clubbing, cyanosis.  No peripheral edema.  Neuro: Alert and oriented X 3. Psych:  Answers questions appropriately.   Labs: Basic Metabolic Panel: Recent Labs    09/03/22 2025 09/05/22 0505  NA 137 136  K 3.8 3.6  CL 106 105  CO2 23 22  GLUCOSE 172* 232*  BUN 19 15  CREATININE 1.02 0.95  CALCIUM 8.5* 8.8*  MG 2.1  --    Liver Function Tests: Recent Labs    09/03/22 0759  AST 14*  ALT 14  ALKPHOS 80  BILITOT 0.7  PROT 7.4  ALBUMIN 3.9   No results for input(s): "LIPASE", "AMYLASE" in the last 72 hours. CBC: Recent Labs    09/03/22 0759 09/04/22 0240 09/05/22 0505  WBC 10.0 10.8* 12.0*  NEUTROABS 6.5  --   --   HGB 16.6 14.7 15.5  HCT 49.2 44.1 45.3  MCV 87.2 88.0 84.8  PLT 338 297 291   Cardiac Enzymes: Recent Labs    09/04/22 0034 09/04/22 0240 09/04/22 0928  TROPONINIHS 578* 723* 1,810*   BNP: No results for input(s): "BNP" in the last 72 hours. D-Dimer: No results for input(s): "DDIMER" in the last 72 hours. Hemoglobin A1C: Recent Labs     09/03/22 0759  HGBA1C 11.4*   Fasting Lipid Panel: Recent Labs    09/03/22 0759  CHOL 200  HDL 27*  LDLCALC 109*  TRIG 319*  CHOLHDL 7.4   Thyroid Function Tests: No results for input(s): "TSH", "T4TOTAL", "T3FREE", "THYROIDAB" in the last 72 hours.  Invalid input(s): "FREET3" Anemia Panel: No results for input(s): "VITAMINB12", "FOLATE", "FERRITIN", "TIBC", "IRON", "RETICCTPCT" in the last 72 hours.   Radiology: CARDIAC CATHETERIZATION  Result Date: 09/04/2022   Ost RCA to Prox RCA lesion is 100% stenosed.   Prox RCA to Mid RCA lesion is 100% stenosed.   1st Mrg lesion is 100% stenosed.   A stent was successfully placed.   Post intervention, there is a 0% residual stenosis.   There is severe left ventricular systolic dysfunction.   LV end diastolic pressure is mildly elevated.   The left ventricular ejection fraction is less than 25% by visual estimate.   In the absence of any other complications or medical issues, we expect the patient to be ready for discharge from an interventional cardiology perspective on 09/05/2022.   Recommend uninterrupted dual antiplatelet therapy with Aspirin 81mg  daily and Ticagrelor 90mg  twice daily for a minimum of 12 months (ACS-Class I recommendation). Conclusion Cardiac cath because of non-STEMI Left ventriculogram Severely depressed left ventricular function globally ejection fraction of around 20 to 25% left ventricular dilatation Coronaries Left main large minor irregularities LAD large minor irregularities Circumflex large 100% OM1 IRA TIMI 0 flow RCA large and percent occlusion proximally CTO totally occluded proximal stent Faint collaterals right to right left to right Intervention Successful PCI and stent to OM1 100 -> to 0% TIMI-3 flow restored from TIMI 0 IRA vessel Recommend maintain Brilinta aspirin for at least 12 months Anticipate discharge hopefully within 23 hours Follow-up with cardiology 1 to 2 weeks upon discharge Follow-up with cardiac rehab  Recommend cardiomyopathy heart failure therapy ACE ARB Arni beta-blocker  spironolactone possibly Farxiga   ECHOCARDIOGRAM COMPLETE  Result Date: 09/04/2022    ECHOCARDIOGRAM REPORT   Patient Name:   [name] Date of Exam: 09/04/2022 Medical Rec #:  [id number]    Height:       69.0 in Accession #:    [id number]   Weight:       180.1 lb Date of Birth:  06/10/1965    BSA:          1.976 m Patient Age:    56 years     BP:           128/69 mmHg Patient Gender: M            HR:           88 bpm. Exam Location:  [facility] Procedure: 2D Echo, 3D Echo, Cardiac Doppler, Color Doppler, Strain Analysis and            Intracardiac Opacification Agent Indications:     Chest Pain  History:         Patient has prior history of Echocardiogram examinations, most                  recent 02/22/2017. CAD and Acute MI, Stroke and TIA,                  Signs/Symptoms:Chest Pain; Risk Factors:Hypertension, Diabetes,                  Dyslipidemia and Current Smoker.  Sonographer:     Mikki Harbor Referring Phys:  [id number] Francoise Schaumann [place] Diagnosing Phys: Alwyn Pea MD  Sonographer Comments: Global longitudinal strain was attempted. IMPRESSIONS  1. Left ventricular ejection fraction, by estimation, is 20 to 25%. The left ventricle has severely decreased function. The left ventricle demonstrates global hypokinesis. The left ventricular internal cavity size was severely dilated. Left ventricular diastolic parameters are consistent with Grade II diastolic dysfunction (pseudonormalization).  2. Right ventricular systolic function is mildly reduced. The right ventricular size is moderately enlarged. There is moderately elevated pulmonary artery systolic pressure.  3. Left atrial size was mildly dilated.  4. Right atrial size was mildly dilated.  5. The mitral valve is normal in structure. Trivial mitral valve regurgitation.  6. The aortic valve is grossly normal. Aortic valve regurgitation is not visualized. Aortic valve  sclerosis/calcification is present, without any evidence of aortic stenosis. FINDINGS  Left Ventricle: Left ventricular ejection fraction, by estimation, is 20 to 25%. The left ventricle has severely decreased function. The left ventricle demonstrates global hypokinesis. Definity contrast agent was given IV to delineate the left ventricular endocardial borders. The left ventricular internal cavity size was severely dilated. There is no left ventricular hypertrophy. Left ventricular diastolic parameters are consistent with Grade II diastolic dysfunction (pseudonormalization). Right Ventricle: The right ventricular size is moderately enlarged. No increase in right ventricular wall thickness. Right ventricular systolic function is mildly reduced. There is moderately elevated pulmonary artery systolic pressure. The tricuspid regurgitant velocity is 3.06 m/s, and with an assumed right atrial pressure of 8 mmHg, the estimated right ventricular systolic pressure is 45.5 mmHg. Left Atrium: Left atrial size was mildly dilated. Right Atrium: Right atrial size was mildly dilated. Pericardium: There is no evidence of pericardial effusion. Mitral Valve: The mitral valve is normal in structure. Trivial mitral valve regurgitation. MV peak gradient, 2.8 mmHg. The mean mitral valve gradient is 1.0 mmHg. Tricuspid Valve: The tricuspid valve is normal in structure. Tricuspid valve regurgitation is  mild. Aortic Valve: The aortic valve is grossly normal. Aortic valve regurgitation is not visualized. Aortic valve sclerosis/calcification is present, without any evidence of aortic stenosis. Aortic valve mean gradient measures 2.0 mmHg. Aortic valve peak gradient measures 4.7 mmHg. Aortic valve area, by VTI measures 2.94 cm. Pulmonic Valve: The pulmonic valve was normal in structure. Pulmonic valve regurgitation is not visualized. Aorta: The ascending aorta was not well visualized. IAS/Shunts: No atrial level shunt detected by color flow  Doppler.  LEFT VENTRICLE PLAX 2D LVIDd:         6.70 cm   Diastology LVIDs:         6.20 cm   LV e' medial:    5.00 cm/s LV PW:         1.10 cm   LV E/e' medial:  17.7 LV IVS:        1.20 cm   LV e' lateral:   7.51 cm/s LVOT diam:     2.00 cm   LV E/e' lateral: 11.8 LV SV:         56 LV SV Index:   28 LVOT Area:     3.14 cm  RIGHT VENTRICLE RV Basal diam:  2.95 cm RV Mid diam:    2.60 cm RV S prime:     12.60 cm/s TAPSE (M-mode): 2.6 cm LEFT ATRIUM           Index        RIGHT ATRIUM           Index LA diam:      4.40 cm 2.23 cm/m   RA Area:     12.80 cm LA Vol (A2C): 76.3 ml 38.61 ml/m  RA Volume:   30.00 ml  15.18 ml/m  AORTIC VALVE                    PULMONIC VALVE AV Area (Vmax):    2.86 cm     PV Vmax:       0.97 m/s AV Area (Vmean):   2.72 cm     PV Peak grad:  3.8 mmHg AV Area (VTI):     2.94 cm AV Vmax:           108.00 cm/s AV Vmean:          64.800 cm/s AV VTI:            0.189 m AV Peak Grad:      4.7 mmHg AV Mean Grad:      2.0 mmHg LVOT Vmax:         98.20 cm/s LVOT Vmean:        56.100 cm/s LVOT VTI:          0.177 m LVOT/AV VTI ratio: 0.94  AORTA Ao Root diam: 3.20 cm MITRAL VALVE               TRICUSPID VALVE MV Area (PHT): 4.71 cm    TR Peak grad:   37.5 mmHg MV Area VTI:   2.71 cm    TR Vmax:        306.00 cm/s MV Peak grad:  2.8 mmHg MV Mean grad:  1.0 mmHg    SHUNTS MV Vmax:       0.83 m/s    Systemic VTI:  0.18 m MV Vmean:      49.1 cm/s   Systemic Diam: 2.00 cm MV Decel Time: 161 msec MV E velocity: 88.60 cm/s MV A velocity: 60.30 cm/s  MV E/A ratio:  1.47 Alwyn Pea MD Electronically signed by Alwyn Pea MD Signature Date/Time: 09/04/2022/3:51:14 PM    Final    DG Chest Portable 1 View  Result Date: 09/03/2022 CLINICAL DATA:  Chest pain EXAM: PORTABLE CHEST 1 VIEW COMPARISON:  03/05/2017 FINDINGS: Monitor and defibrillator pads overlie the chest. Prominent heart size. Lungs remain clear. No focal pneumonia, edema, effusion or pneumothorax. Trachea midline. No osseous  abnormality. IMPRESSION: No acute chest process Electronically Signed   By: Judie Petit.  Shick M.D.   On: 09/03/2022 09:00      TELEMETRY reviewed by me (LT) 09/05/2022 : Sinus rhythm 80s to 90s  EKG reviewed by me: NSR 90 bpm, LBBB  Data reviewed by me (LT) 09/05/2022: ED note, admission H&P, hospitalist progress note last 24h vitals tele labs imaging I/O   Principal Problem:   NSTEMI (non-ST elevated myocardial infarction) [facility]) Active Problems:   Esophageal reflux   Hyperlipidemia   Hypertension   Uncontrolled type 2 diabetes mellitus with hyperglycemia (HCC)   Numbness and tingling in left arm   Tobacco abuse counseling   History of CVA (cerebrovascular accident)    ASSESSMENT AND PLAN:  Semir R. Freiermuth is a 57yoM with a PMH of CAD s/p PCI to RCA (~2000s), uncontrolled DM2 (A1c 11%), HLD, HTN, hx CVA, ongoing tobacco use who presented to [name] 09/03/2022 with 2 days of progressively worsening chest pain at rest and with exertion.  EKG showed sinus rhythm with a new LBBB on admission.  Troponins trended 85, 75, 73, 102, 578, 723, 1810, ruling in NSTEMI.  LHC performed 8/13 revealed 100% occluded OM1 s/p successful PCI with DES and 100% proximal CTO of RCA previously placed stent, managed medically.  Echo showed newly reduced EF to 20-25%, global HK and severe dilation, G2 DD, trivial MR.  # NSTEMI # CAD s/p successful PCI with DES to OM1 - Continue aspirin 81 mg daily, indefinitely -Change Brilinta to Plavix as this is more cost effective, load with Plavix 300 mg x 1, then start 75 mg daily starting tomorrow, 8/15 -Atorvastatin 80 mg daily -Aggressive secondary prevention and risk factor modification -Cardiac rehab at discharge  # Chronic HFrEF # LBBB Longstanding history of cardiomyopathy dating back to 2017 (limited echo from 02/2017 showed EF 45-50%, from 12/2015 EF was 30-35% with mild LV dilation), previously on GDMT but patient admits to "not having time to take his medications."  Echo  this admission with EF down to 20-25%.  Euvolemic on exam.  QRS duration -Stressed the importance of medication compliance. -GDMT with metoprolol XL 12.5 mg daily, lisinopril 40 mg daily, Farxiga 10 mg daily, spironolactone 12.5 mg daily -Will need outpatient heart failure follow-up and if compliant with medications, referral to EP for ?CRT eval  # Tobacco abuse Strongly encouraged complete cessation, nicotine patches.  Ok for discharge today from a cardiac perspective. Will arrange for follow up in clinic in 1-2 weeks.    This patient's plan of care was discussed and created with Dr. Juliann Pares and he is in agreement.  Signed: Rebeca Allegra , PA-C 09/05/2022, 8:23 AM [name]

## 2022-09-05 NOTE — Plan of Care (Signed)
Problem: Education: Goal: Knowledge of General Education information will improve Description: Including pain rating scale, medication(s)/side effects and non-pharmacologic comfort measures 09/05/2022 1116 by Atul Delucia, [place] Livings, RN Outcome: Adequate for Discharge 09/05/2022 0801 by Beryle Lathe, RN Outcome: Progressing   Problem: Health Behavior/Discharge Planning: Goal: Ability to manage health-related needs will improve 09/05/2022 1116 by Dezi Schaner, [place] Livings, RN Outcome: Adequate for Discharge 09/05/2022 0801 by Beryle Lathe, RN Outcome: Progressing   Problem: Clinical Measurements: Goal: Ability to maintain clinical measurements within normal limits will improve 09/05/2022 1116 by Joaquina Nissen, [place] Livings, RN Outcome: Adequate for Discharge 09/05/2022 0801 by Beryle Lathe, RN Outcome: Progressing Goal: Will remain free from infection 09/05/2022 1116 by Kassidi Elza, [place] Livings, RN Outcome: Adequate for Discharge 09/05/2022 0801 by Beryle Lathe, RN Outcome: Progressing Goal: Diagnostic test results will improve 09/05/2022 1116 by Kizzi Overbey, [place] Livings, RN Outcome: Adequate for Discharge 09/05/2022 0801 by Beryle Lathe, RN Outcome: Progressing Goal: Respiratory complications will improve 09/05/2022 1116 by Niomi Valent, [place] Livings, RN Outcome: Adequate for Discharge 09/05/2022 0801 by Beryle Lathe, RN Outcome: Progressing Goal: Cardiovascular complication will be avoided 09/05/2022 1116 by Marquavis Hannen, [place] Livings, RN Outcome: Adequate for Discharge 09/05/2022 0801 by Beryle Lathe, RN Outcome: Progressing   Problem: Activity: Goal: Risk for activity intolerance will decrease 09/05/2022 1116 by Russia Scheiderer, [place] Livings, RN Outcome: Adequate for Discharge 09/05/2022 0801 by Beryle Lathe, RN Outcome: Progressing   Problem: Nutrition: Goal: Adequate nutrition will be maintained 09/05/2022 1116 by Mccall Lomax, [place] Livings, RN Outcome: Adequate for Discharge 09/05/2022 0801 by  Beryle Lathe, RN Outcome: Progressing   Problem: Coping: Goal: Level of anxiety will decrease 09/05/2022 1116 by Hatcher Froning, [place] Livings, RN Outcome: Adequate for Discharge 09/05/2022 0801 by Beryle Lathe, RN Outcome: Progressing   Problem: Elimination: Goal: Will not experience complications related to bowel motility 09/05/2022 1116 by Loren Sawaya, [place] Livings, RN Outcome: Adequate for Discharge 09/05/2022 0801 by Beryle Lathe, RN Outcome: Progressing Goal: Will not experience complications related to urinary retention 09/05/2022 1116 by Nathian Stencil, [place] Livings, RN Outcome: Adequate for Discharge 09/05/2022 0801 by Beryle Lathe, RN Outcome: Progressing   Problem: Pain Managment: Goal: General experience of comfort will improve 09/05/2022 1116 by Saniya Tranchina, [place] Livings, RN Outcome: Adequate for Discharge 09/05/2022 0801 by Beryle Lathe, RN Outcome: Progressing   Problem: Safety: Goal: Ability to remain free from injury will improve 09/05/2022 1116 by Adriauna Campton, [place] Livings, RN Outcome: Adequate for Discharge 09/05/2022 0801 by Beryle Lathe, RN Outcome: Progressing   Problem: Skin Integrity: Goal: Risk for impaired skin integrity will decrease 09/05/2022 1116 by Medard Decuir, [place] Livings, RN Outcome: Adequate for Discharge 09/05/2022 0801 by Beryle Lathe, RN Outcome: Progressing   Problem: Education: Goal: Understanding of cardiac disease, CV risk reduction, and recovery process will improve 09/05/2022 1116 by Tarren Sabree, [place] Livings, RN Outcome: Adequate for Discharge 09/05/2022 0801 by Beryle Lathe, RN Outcome: Progressing Goal: Individualized Educational Video(s) 09/05/2022 1116 by Beryle Lathe, RN Outcome: Adequate for Discharge 09/05/2022 0801 by Beryle Lathe, RN Outcome: Progressing   Problem: Activity: Goal: Ability to tolerate increased activity will improve 09/05/2022 1116 by Pristine Gladhill, [place] Livings, RN Outcome: Adequate for Discharge 09/05/2022  0801 by Beryle Lathe, RN Outcome: Progressing   Problem: Cardiac: Goal: Ability to achieve and maintain adequate cardiovascular perfusion will improve 09/05/2022 1116 by Camile Esters, [place] Livings, RN Outcome: Adequate for Discharge 09/05/2022 0801 by Beryle Lathe, RN Outcome: Progressing   Problem: Health Behavior/Discharge  Planning: Goal: Ability to safely manage health-related needs after discharge will improve 09/05/2022 1116 by Stepheni Cameron, [place] Livings, RN Outcome: Adequate for Discharge 09/05/2022 0801 by Beryle Lathe, RN Outcome: Progressing   Problem: Education: Goal: Ability to describe self-care measures that may prevent or decrease complications (Diabetes Survival Skills Education) will improve 09/05/2022 1116 by Kazimierz Springborn, [place] Livings, RN Outcome: Adequate for Discharge 09/05/2022 0801 by Beryle Lathe, RN Outcome: Progressing Goal: Individualized Educational Video(s) 09/05/2022 1116 by Beryle Lathe, RN Outcome: Adequate for Discharge 09/05/2022 0801 by Beryle Lathe, RN Outcome: Progressing   Problem: Coping: Goal: Ability to adjust to condition or change in health will improve 09/05/2022 1116 by Langston Tuberville, [place] Livings, RN Outcome: Adequate for Discharge 09/05/2022 0801 by Beryle Lathe, RN Outcome: Progressing   Problem: Fluid Volume: Goal: Ability to maintain a balanced intake and output will improve 09/05/2022 1116 by Carli Lefevers, [place] Livings, RN Outcome: Adequate for Discharge 09/05/2022 0801 by Beryle Lathe, RN Outcome: Progressing   Problem: Health Behavior/Discharge Planning: Goal: Ability to identify and utilize available resources and services will improve 09/05/2022 1116 by Aella Ronda, [place] Livings, RN Outcome: Adequate for Discharge 09/05/2022 0801 by Beryle Lathe, RN Outcome: Progressing Goal: Ability to manage health-related needs will improve 09/05/2022 1116 by Jamey Demchak, [place] Livings, RN Outcome: Adequate for Discharge 09/05/2022 0801 by  Beryle Lathe, RN Outcome: Progressing   Problem: Metabolic: Goal: Ability to maintain appropriate glucose levels will improve 09/05/2022 1116 by Shaymus Eveleth, [place] Livings, RN Outcome: Adequate for Discharge 09/05/2022 0801 by Beryle Lathe, RN Outcome: Progressing   Problem: Nutritional: Goal: Maintenance of adequate nutrition will improve 09/05/2022 1116 by Tejasvi Brissett, [place] Livings, RN Outcome: Adequate for Discharge 09/05/2022 0801 by Beryle Lathe, RN Outcome: Progressing Goal: Progress toward achieving an optimal weight will improve 09/05/2022 1116 by Qadir Folks, [place] Livings, RN Outcome: Adequate for Discharge 09/05/2022 0801 by Beryle Lathe, RN Outcome: Progressing   Problem: Skin Integrity: Goal: Risk for impaired skin integrity will decrease 09/05/2022 1116 by Billiejean Schimek, [place] Livings, RN Outcome: Adequate for Discharge 09/05/2022 0801 by Beryle Lathe, RN Outcome: Progressing   Problem: Tissue Perfusion: Goal: Adequacy of tissue perfusion will improve 09/05/2022 1116 by Courtney Bellizzi, [place] Livings, RN Outcome: Adequate for Discharge 09/05/2022 0801 by Beryle Lathe, RN Outcome: Progressing   Problem: Education: Goal: Understanding of CV disease, CV risk reduction, and recovery process will improve 09/05/2022 1116 by Kyo Cocuzza, [place] Livings, RN Outcome: Adequate for Discharge 09/05/2022 0801 by Beryle Lathe, RN Outcome: Progressing Goal: Individualized Educational Video(s) 09/05/2022 1116 by Beryle Lathe, RN Outcome: Adequate for Discharge 09/05/2022 0801 by Beryle Lathe, RN Outcome: Progressing   Problem: Activity: Goal: Ability to return to baseline activity level will improve 09/05/2022 1116 by Lomax Poehler, [place] Livings, RN Outcome: Adequate for Discharge 09/05/2022 0801 by Beryle Lathe, RN Outcome: Progressing   Problem: Cardiovascular: Goal: Ability to achieve and maintain adequate cardiovascular perfusion will improve 09/05/2022 1116 by Creston Klas, [place] Livings, RN Outcome: Adequate for Discharge 09/05/2022 0801 by Beryle Lathe, RN Outcome: Progressing Goal: Vascular access site(s) Level 0-1 will be maintained 09/05/2022 1116 by Derico Mitton, [place] Livings, RN Outcome: Adequate for Discharge 09/05/2022 0801 by Beryle Lathe, RN Outcome: Progressing   Problem: Health Behavior/Discharge Planning: Goal: Ability to safely manage health-related needs after discharge will improve 09/05/2022 1116 by Byrant Valent, [place] Livings, RN Outcome: Adequate for Discharge 09/05/2022 0801 by Beryle Lathe, RN Outcome: Progressing

## 2022-09-05 NOTE — Discharge Instructions (Signed)
You have had several medication changes this hospitalization.  Please review your new medications and their instructions very carefully and make sure you understand them well prior to leaving the hospital.  You will follow up with cardiology in 1 week

## 2022-09-05 NOTE — Discharge Summary (Signed)
Physician Discharge Summary  Patient: Kenneth [name] FTD:[id number] DOB: [birth year]   Code Status: Full Code Admit date: 09/03/2022 Discharge date: 09/05/2022 Disposition: Home, No home health services recommended PCP: Anola Gurney, PA  Recommendations for Outpatient Follow-up:  Follow up with PCP within 1-2 weeks Regarding general hospital follow up and preventative care Recommend continue to encourage tobacco use cessation Follow up with cardiology 1-2 weeks  Possible EP referral  Discharge Diagnoses:  Principal Problem:   NSTEMI (non-ST elevated myocardial infarction) (HCC) Active Problems:   Uncontrolled type 2 diabetes mellitus with hyperglycemia (HCC)   Esophageal reflux   Hyperlipidemia   Hypertension   Numbness and tingling in left arm   Tobacco abuse counseling   History of CVA (cerebrovascular accident)  Brief Hospital Course Summary: Kenneth [name] is a 56 y.o. male with medical history significant of hypertension, hyperlipidemia, tobacco abuse, coronary artery status post stenting, uncontrolled type 2 diabetes, history of CVA presenting with chest pain x2 days 08/12: to ED, admitted to hospitalist w/ NSTEMI, cardiology to consult.  08/13: cardiac cath - stent to OM, chronic total occlusion RCA, will be on DAPT x1 year. Echo showed worsened function from prior- EF 20-25% 8/14: chest pain free. Medications have been managed per cardiology as listed below to optimize heart health.   Discharge Condition: Stable, improved Recommended discharge diet: Cardiac diet  Consultations: Cardiology   Procedures/Studies: LHC, PCI Echo   Discharge Instructions     AMB Referral to Cardiac Rehabilitation - Phase II   Complete by: As directed    Diagnosis: NSTEMI   After initial evaluation and assessments completed: Virtual Based Care may be provided alone or in conjunction with Phase 2 Cardiac Rehab based on patient barriers.: Yes      Allergies as of 09/05/2022   No  Known Allergies      Medication List     STOP taking these medications    amLODipine 10 MG tablet Commonly known as: NORVASC   bisoprolol-hydrochlorothiazide 5-6.25 MG tablet Commonly known as: ZIAC   DULoxetine 30 MG capsule Commonly known as: CYMBALTA   gabapentin 300 MG capsule Commonly known as: NEURONTIN   ondansetron 4 MG disintegrating tablet Commonly known as: Zofran ODT       TAKE these medications    aspirin EC 81 MG tablet Take 1 tablet (81 mg total) by mouth daily. Swallow whole. Start taking on: September 06, 2022 What changed:  how much to take additional instructions   atorvastatin 80 MG tablet Commonly known as: LIPITOR Take 1 tablet (80 mg total) by mouth daily at 6 PM.   buPROPion ER 100 MG 12 hr tablet Commonly known as: WELLBUTRIN SR Take 100 mg by mouth 2 (two) times daily.   clopidogrel 75 MG tablet Commonly known as: PLAVIX Take 1 tablet (75 mg total) by mouth daily.   Farxiga 10 MG Tabs tablet Generic drug: dapagliflozin propanediol Take 1 tablet (10 mg total) by mouth daily. Start taking on: September 06, 2022   glimepiride 4 MG tablet Commonly known as: AMARYL Take 1 tablet (4 mg total) by mouth daily with breakfast.   lisinopril 40 MG tablet Commonly known as: ZESTRIL Take 1 tablet (40 mg total) by mouth daily.   metFORMIN 1000 MG tablet Commonly known as: GLUCOPHAGE TAKE 1 TABLET BY MOUTH TWICE DAILY WITH MEALS   metoprolol succinate 25 MG 24 hr tablet Commonly known as: TOPROL-XL Take 0.5 tablets (12.5 mg total) by mouth daily.   Nicotine Step  1 21 MG/24HR patch Generic drug: nicotine Place 1 patch (21 mg total) onto the skin daily. Start taking on: September 06, 2022   omeprazole 20 MG capsule Commonly known as: PRILOSEC Take 20 mg by mouth daily.   spironolactone 25 MG tablet Commonly known as: ALDACTONE Take 0.5 tablets (12.5 mg total) by mouth daily. Start taking on: September 06, 2022 What changed: how much to  take        Follow-up Information     Custovic, Rozell Searing, DO. Go in 1 week(s).   Specialty: Cardiology Why: Appointment on Wednesday, 09/12/2022 at 8:45am. Contact information: [address] [place] [address] [phone]                 Subjective   Pt reports feeling well today. He has no chest pain or shortness of breath at rest or with the mild exertion he's had in walking around his room. Denies LE swelling.   All questions and concerns were addressed at time of discharge.  Objective  Blood pressure 112/68, pulse 93, temperature 97.8 F (36.6 C), temperature source Oral, resp. rate 20, height 5\' 9"  (1.753 m), weight 77.1 kg, SpO2 96%.   General: Pt is alert, awake, not in acute distress Cardiovascular: RRR, S1/S2 +, no rubs, no gallops Respiratory: CTA bilaterally, no wheezing, no rhonchi Abdominal: Soft, NT, ND, bowel sounds + Extremities: no edema, no cyanosis  The results of significant diagnostics from this hospitalization (including imaging, microbiology, ancillary and laboratory) are listed below for reference.   Imaging studies: CARDIAC CATHETERIZATION  Result Date: 09/04/2022   Ost RCA to Prox RCA lesion is 100% stenosed.   Prox RCA to Mid RCA lesion is 100% stenosed.   1st Mrg lesion is 100% stenosed.   A stent was successfully placed.   Post intervention, there is a 0% residual stenosis.   There is severe left ventricular systolic dysfunction.   LV end diastolic pressure is mildly elevated.   The left ventricular ejection fraction is less than 25% by visual estimate.   In the absence of any other complications or medical issues, we expect the patient to be ready for discharge from an interventional cardiology perspective on 09/05/2022.   Recommend uninterrupted dual antiplatelet therapy with Aspirin 81mg  daily and Ticagrelor 90mg  twice daily for a minimum of 12 months (ACS-Class I recommendation). Conclusion Cardiac cath because of non-STEMI Left  ventriculogram Severely depressed left ventricular function globally ejection fraction of around 20 to 25% left ventricular dilatation Coronaries Left main large minor irregularities LAD large minor irregularities Circumflex large 100% OM1 IRA TIMI 0 flow RCA large and percent occlusion proximally CTO totally occluded proximal stent Faint collaterals right to right left to right Intervention Successful PCI and stent to OM1 100 -> to 0% TIMI-3 flow restored from TIMI 0 IRA vessel Recommend maintain Brilinta aspirin for at least 12 months Anticipate discharge hopefully within 23 hours Follow-up with cardiology 1 to 2 weeks upon discharge Follow-up with cardiac rehab Recommend cardiomyopathy heart failure therapy ACE ARB Arni beta-blocker spironolactone possibly Farxiga   ECHOCARDIOGRAM COMPLETE  Result Date: 09/04/2022    ECHOCARDIOGRAM REPORT   Patient Name:   Kenneth [name] Date of Exam: 09/04/2022 Medical Rec #:  [id number]    Height:       69.0 in Accession #:    [id number]   Weight:       180.1 lb Date of Birth:  1965/02/15    BSA:  1.976 m Patient Age:    56 years     BP:           128/69 mmHg Patient Gender: M            HR:           88 bpm. Exam Location:  [facility] Procedure: 2D Echo, 3D Echo, Cardiac Doppler, Color Doppler, Strain Analysis and            Intracardiac Opacification Agent Indications:     Chest Pain  History:         Patient has prior history of Echocardiogram examinations, most                  recent 02/22/2017. CAD and Acute MI, Stroke and TIA,                  Signs/Symptoms:Chest Pain; Risk Factors:Hypertension, Diabetes,                  Dyslipidemia and Current Smoker.  Sonographer:     Mikki Harbor Referring Phys:  [phone] Francoise Schaumann [place] Diagnosing Phys: Alwyn Pea MD  Sonographer Comments: Global longitudinal strain was attempted. IMPRESSIONS  1. Left ventricular ejection fraction, by estimation, is 20 to 25%. The left ventricle has severely decreased function. The left  ventricle demonstrates global hypokinesis. The left ventricular internal cavity size was severely dilated. Left ventricular diastolic parameters are consistent with Grade II diastolic dysfunction (pseudonormalization).  2. Right ventricular systolic function is mildly reduced. The right ventricular size is moderately enlarged. There is moderately elevated pulmonary artery systolic pressure.  3. Left atrial size was mildly dilated.  4. Right atrial size was mildly dilated.  5. The mitral valve is normal in structure. Trivial mitral valve regurgitation.  6. The aortic valve is grossly normal. Aortic valve regurgitation is not visualized. Aortic valve sclerosis/calcification is present, without any evidence of aortic stenosis. FINDINGS  Left Ventricle: Left ventricular ejection fraction, by estimation, is 20 to 25%. The left ventricle has severely decreased function. The left ventricle demonstrates global hypokinesis. Definity contrast agent was given IV to delineate the left ventricular endocardial borders. The left ventricular internal cavity size was severely dilated. There is no left ventricular hypertrophy. Left ventricular diastolic parameters are consistent with Grade II diastolic dysfunction (pseudonormalization). Right Ventricle: The right ventricular size is moderately enlarged. No increase in right ventricular wall thickness. Right ventricular systolic function is mildly reduced. There is moderately elevated pulmonary artery systolic pressure. The tricuspid regurgitant velocity is 3.06 m/s, and with an assumed right atrial pressure of 8 mmHg, the estimated right ventricular systolic pressure is 45.5 mmHg. Left Atrium: Left atrial size was mildly dilated. Right Atrium: Right atrial size was mildly dilated. Pericardium: There is no evidence of pericardial effusion. Mitral Valve: The mitral valve is normal in structure. Trivial mitral valve regurgitation. MV peak gradient, 2.8 mmHg. The mean mitral valve  gradient is 1.0 mmHg. Tricuspid Valve: The tricuspid valve is normal in structure. Tricuspid valve regurgitation is mild. Aortic Valve: The aortic valve is grossly normal. Aortic valve regurgitation is not visualized. Aortic valve sclerosis/calcification is present, without any evidence of aortic stenosis. Aortic valve mean gradient measures 2.0 mmHg. Aortic valve peak gradient measures 4.7 mmHg. Aortic valve area, by VTI measures 2.94 cm. Pulmonic Valve: The pulmonic valve was normal in structure. Pulmonic valve regurgitation is not visualized. Aorta: The ascending aorta was not well visualized. IAS/Shunts: No atrial level shunt detected by color flow  Doppler.  LEFT VENTRICLE PLAX 2D LVIDd:         6.70 cm   Diastology LVIDs:         6.20 cm   LV e' medial:    5.00 cm/s LV PW:         1.10 cm   LV E/e' medial:  17.7 LV IVS:        1.20 cm   LV e' lateral:   7.51 cm/s LVOT diam:     2.00 cm   LV E/e' lateral: 11.8 LV SV:         56 LV SV Index:   28 LVOT Area:     3.14 cm  RIGHT VENTRICLE RV Basal diam:  2.95 cm RV Mid diam:    2.60 cm RV S prime:     12.60 cm/s TAPSE (M-mode): 2.6 cm LEFT ATRIUM           Index        RIGHT ATRIUM           Index LA diam:      4.40 cm 2.23 cm/m   RA Area:     12.80 cm LA Vol (A2C): 76.3 ml 38.61 ml/m  RA Volume:   30.00 ml  15.18 ml/m  AORTIC VALVE                    PULMONIC VALVE AV Area (Vmax):    2.86 cm     PV Vmax:       0.97 m/s AV Area (Vmean):   2.72 cm     PV Peak grad:  3.8 mmHg AV Area (VTI):     2.94 cm AV Vmax:           108.00 cm/s AV Vmean:          64.800 cm/s AV VTI:            0.189 m AV Peak Grad:      4.7 mmHg AV Mean Grad:      2.0 mmHg LVOT Vmax:         98.20 cm/s LVOT Vmean:        56.100 cm/s LVOT VTI:          0.177 m LVOT/AV VTI ratio: 0.94  AORTA Ao Root diam: 3.20 cm MITRAL VALVE               TRICUSPID VALVE MV Area (PHT): 4.71 cm    TR Peak grad:   37.5 mmHg MV Area VTI:   2.71 cm    TR Vmax:        306.00 cm/s MV Peak grad:  2.8 mmHg MV  Mean grad:  1.0 mmHg    SHUNTS MV Vmax:       0.83 m/s    Systemic VTI:  0.18 m MV Vmean:      49.1 cm/s   Systemic Diam: 2.00 cm MV Decel Time: 161 msec MV E velocity: 88.60 cm/s MV A velocity: 60.30 cm/s MV E/A ratio:  1.47 Dwayne Salome Arnt MD Electronically signed by Alwyn Pea MD Signature Date/Time: 09/04/2022/3:51:14 PM    Final    DG Chest Portable 1 View  Result Date: 09/03/2022 CLINICAL DATA:  Chest pain EXAM: PORTABLE CHEST 1 VIEW COMPARISON:  03/05/2017 FINDINGS: Monitor and defibrillator pads overlie the chest. Prominent heart size. Lungs remain clear. No focal pneumonia, edema, effusion or pneumothorax. Trachea midline. No osseous abnormality. IMPRESSION: No acute chest process  Electronically Signed   By: Judie Petit.  Shick M.D.   On: 09/03/2022 09:00    Labs: Basic Metabolic Panel: Recent Labs  Lab 09/03/22 0759 09/03/22 2025 09/05/22 0505  NA 136 137 136  K 4.4 3.8 3.6  CL 103 106 105  CO2 21* 23 22  GLUCOSE 358* 172* 232*  BUN 19 19 15   CREATININE 1.03 1.02 0.95  CALCIUM 9.3 8.5* 8.8*  MG  --  2.1  --    CBC: Recent Labs  Lab 09/03/22 0759 09/04/22 0240 09/05/22 0505  WBC 10.0 10.8* 12.0*  NEUTROABS 6.5  --   --   HGB 16.6 14.7 15.5  HCT 49.2 44.1 45.3  MCV 87.2 88.0 84.8  PLT 338 297 291   Microbiology: Results for orders placed or performed during the hospital encounter of 09/03/22  Resp panel by RT-PCR (RSV, Flu A&B, Covid) Anterior Nasal Swab     Status: None   Collection Time: 09/04/22 11:06 AM   Specimen: Anterior Nasal Swab  Result Value Ref Range Status   SARS Coronavirus 2 by RT PCR NEGATIVE NEGATIVE Final    Comment: (NOTE) SARS-CoV-2 target nucleic acids are NOT DETECTED.  The SARS-CoV-2 RNA is generally detectable in upper respiratory specimens during the acute phase of infection. The lowest concentration of SARS-CoV-2 viral copies this assay can detect is 138 copies/mL. A negative result does not preclude SARS-Cov-2 infection and should not  be used as the sole basis for treatment or other patient management decisions. A negative result may occur with  improper specimen collection/handling, submission of specimen other than nasopharyngeal swab, presence of viral mutation(s) within the areas targeted by this assay, and inadequate number of viral copies(<138 copies/mL). A negative result must be combined with clinical observations, patient history, and epidemiological information. The expected result is Negative.  Fact Sheet for Patients:  BloggerCourse.com  Fact Sheet for Healthcare Providers:  SeriousBroker.it  This test is no t yet approved or cleared by the [place] FDA and  has been authorized for detection and/or diagnosis of SARS-CoV-2 by FDA under an Emergency Use Authorization (EUA). This EUA will remain  in effect (meaning this test can be used) for the duration of the COVID-19 declaration under Section 564(b)(1) of the Act, 21 U.S.C.section 360bbb-3(b)(1), unless the authorization is terminated  or revoked sooner.       Influenza A by PCR NEGATIVE NEGATIVE Final   Influenza B by PCR NEGATIVE NEGATIVE Final    Comment: (NOTE) The Xpert Xpress SARS-CoV-2/FLU/RSV plus assay is intended as an aid in the diagnosis of influenza from Nasopharyngeal swab specimens and should not be used as a sole basis for treatment. Nasal washings and aspirates are unacceptable for Xpert Xpress SARS-CoV-2/FLU/RSV testing.  Fact Sheet for Patients: BloggerCourse.com  Fact Sheet for Healthcare Providers: SeriousBroker.it  This test is not yet approved or cleared by the [place] FDA and has been authorized for detection and/or diagnosis of SARS-CoV-2 by FDA under an Emergency Use Authorization (EUA). This EUA will remain in effect (meaning this test can be used) for the duration of the COVID-19 declaration under Section  564(b)(1) of the Act, 21 U.S.C. section 360bbb-3(b)(1), unless the authorization is terminated or revoked.     Resp Syncytial Virus by PCR NEGATIVE NEGATIVE Final    Comment: (NOTE) Fact Sheet for Patients: BloggerCourse.com  Fact Sheet for Healthcare Providers: SeriousBroker.it  This test is not yet approved or cleared by the [place] FDA and has been authorized for detection and/or  diagnosis of SARS-CoV-2 by FDA under an Emergency Use Authorization (EUA). This EUA will remain in effect (meaning this test can be used) for the duration of the COVID-19 declaration under Section 564(b)(1) of the Act, 21 U.S.C. section 360bbb-3(b)(1), unless the authorization is terminated or revoked.  Performed at [facility], [address], [place], [place] [address]    Time coordinating discharge: Over 30 minutes  Leeroy Bock, MD  [facility] 09/05/2022, 11:45 AM

## 2022-09-05 NOTE — Inpatient Diabetes Management (Signed)
Inpatient Diabetes Program Recommendations  AACE/ADA: New Consensus Statement on Inpatient Glycemic Control (2015)  Target Ranges:  Prepandial:   less than 140 mg/dL      Peak postprandial:   less than 180 mg/dL (1-2 hours)      Critically ill patients:  140 - 180 mg/dL   Lab Results  Component Value Date   GLUCAP 319 (H) 09/05/2022   HGBA1C 11.4 (H) 09/03/2022    Review of Glycemic Control  Latest Reference Range & Units 09/04/22 07:47 09/04/22 10:53 09/04/22 16:06 09/04/22 21:32 09/05/22 08:09  Glucose-Capillary 70 - 99 mg/dL 474 (H) 259 (H) 563 (H) 266 (H) 319 (H)   Diabetes history: DM  Outpatient Diabetes medications:  Amaryl 4 mg daily Metformin 1000 mg bid Current orders for Inpatient glycemic control:  Novolog 0-15 units tid with meals Levemir 8 units daily  Inpatient Diabetes Program Recommendations:    Attempted to speak with patient regarding DM management at home.  Patient seemed frustrated and wants to leave.  Briefly discussed current A1C of 11.4% and that this is increased from last MD visit. He states he will not take insulin and is not interested at this time in further education but just wants to go home. Per pharmacist, it does not appear patient was taking PO medications for DM at home.  Agree with the addition of Farxiga to current oral DM medications. Reported to RN that patient is eager to go home.   Thanks,  Beryl Meager, RN, BC-ADM Inpatient Diabetes Coordinator Pager [phone] (8a-5p)

## 2022-09-05 NOTE — Plan of Care (Signed)
  Problem: Education: Goal: Knowledge of General Education information will improve Description: Including pain rating scale, medication(s)/side effects and non-pharmacologic comfort measures Outcome: Progressing   Problem: Health Behavior/Discharge Planning: Goal: Ability to manage health-related needs will improve Outcome: Progressing   Problem: Clinical Measurements: Goal: Ability to maintain clinical measurements within normal limits will improve Outcome: Progressing Goal: Will remain free from infection Outcome: Progressing Goal: Diagnostic test results will improve Outcome: Progressing Goal: Respiratory complications will improve Outcome: Progressing Goal: Cardiovascular complication will be avoided Outcome: Progressing   Problem: Activity: Goal: Risk for activity intolerance will decrease Outcome: Progressing   Problem: Nutrition: Goal: Adequate nutrition will be maintained Outcome: Progressing   Problem: Coping: Goal: Level of anxiety will decrease Outcome: Progressing   Problem: Elimination: Goal: Will not experience complications related to bowel motility Outcome: Progressing Goal: Will not experience complications related to urinary retention Outcome: Progressing   Problem: Pain Managment: Goal: General experience of comfort will improve Outcome: Progressing   Problem: Safety: Goal: Ability to remain free from injury will improve Outcome: Progressing   Problem: Skin Integrity: Goal: Risk for impaired skin integrity will decrease Outcome: Progressing   Problem: Education: Goal: Understanding of cardiac disease, CV risk reduction, and recovery process will improve Outcome: Progressing Goal: Individualized Educational Video(s) Outcome: Progressing   Problem: Activity: Goal: Ability to tolerate increased activity will improve Outcome: Progressing   Problem: Cardiac: Goal: Ability to achieve and maintain adequate cardiovascular perfusion will  improve Outcome: Progressing   Problem: Health Behavior/Discharge Planning: Goal: Ability to safely manage health-related needs after discharge will improve Outcome: Progressing   Problem: Education: Goal: Ability to describe self-care measures that may prevent or decrease complications (Diabetes Survival Skills Education) will improve Outcome: Progressing Goal: Individualized Educational Video(s) Outcome: Progressing   Problem: Coping: Goal: Ability to adjust to condition or change in health will improve Outcome: Progressing   Problem: Fluid Volume: Goal: Ability to maintain a balanced intake and output will improve Outcome: Progressing   Problem: Health Behavior/Discharge Planning: Goal: Ability to identify and utilize available resources and services will improve Outcome: Progressing Goal: Ability to manage health-related needs will improve Outcome: Progressing   Problem: Metabolic: Goal: Ability to maintain appropriate glucose levels will improve Outcome: Progressing   Problem: Nutritional: Goal: Maintenance of adequate nutrition will improve Outcome: Progressing Goal: Progress toward achieving an optimal weight will improve Outcome: Progressing   Problem: Skin Integrity: Goal: Risk for impaired skin integrity will decrease Outcome: Progressing   Problem: Tissue Perfusion: Goal: Adequacy of tissue perfusion will improve Outcome: Progressing   Problem: Education: Goal: Understanding of CV disease, CV risk reduction, and recovery process will improve Outcome: Progressing Goal: Individualized Educational Video(s) Outcome: Progressing   Problem: Activity: Goal: Ability to return to baseline activity level will improve Outcome: Progressing   Problem: Cardiovascular: Goal: Ability to achieve and maintain adequate cardiovascular perfusion will improve Outcome: Progressing Goal: Vascular access site(s) Level 0-1 will be maintained Outcome: Progressing    Problem: Health Behavior/Discharge Planning: Goal: Ability to safely manage health-related needs after discharge will improve Outcome: Progressing

## 2022-09-06 ENCOUNTER — Other Ambulatory Visit: Payer: Self-pay

## 2022-09-10 NOTE — Progress Notes (Deleted)
Advanced Heart Failure Clinic Note   Referring Physician: PCP: Aram Beecham, MD (last seen 10/23) Cardiologist: None   HPI:  Kenneth [name] is a 57 y/o male with a history of  Admitted 09/03/22 due to chest pain x2 days. Cardiology consulted. 08/13: cardiac cath - stent to OM, chronic total occlusion RCA, will be on DAPT x1 year. Echo showed worsened function from prior- EF 20-25%. Cardiac rehab referral placed.   Echo 12/29/15: EF 30-35% along with mild Kenneth.  Echo 02/20/17: EF 35-40% along with Grade II DD, trivial Kenneth, mild LAE and PA pressure of 34 mmHg Echo 02/22/17: EF 45-50% Echo 09/04/22: EF 20-25% along with Grade II DD, moderately elevated PA pressure and mild LAE/RAE  LHC 09/04/22:   Ost RCA to Prox RCA lesion is 100% stenosed.   Prox RCA to Mid RCA lesion is 100% stenosed.   1st Mrg lesion is 100% stenosed.   A stent was successfully placed.   Post intervention, there is a 0% residual stenosis.   There is severe left ventricular systolic dysfunction.   LV end diastolic pressure is mildly elevated.   The left ventricular ejection fraction is less than 25% by visual estimate.   In the absence of any other complications or medical issues, we expect the patient to be ready for discharge from an interventional cardiology perspective on 09/05/2022.   Recommend uninterrupted dual antiplatelet therapy with Aspirin 81mg  daily and Ticagrelor 90mg  twice daily for a minimum of 12 months (ACS-Class I recommendation).  He presents today for his initial visit with a chief complaint of   Review of Systems: [y] = yes, [ ]  = no   General: Weight gain [ ] ; Weight loss [ ] ; Anorexia [ ] ; Fatigue [ ] ; Fever [ ] ; Chills [ ] ; Weakness [ ]   Cardiac: Chest pain/pressure [ ] ; Resting SOB [ ] ; Exertional SOB [ ] ; Orthopnea [ ] ; Pedal Edema [ ] ; Palpitations [ ] ; Syncope [ ] ; Presyncope [ ] ; Paroxysmal nocturnal dyspnea[ ]   Pulmonary: Cough [ ] ; Wheezing[ ] ; Hemoptysis[ ] ; Sputum [ ] ; Snoring [ ]   GI:  Vomiting[ ] ; Dysphagia[ ] ; Melena[ ] ; Hematochezia [ ] ; Heartburn[ ] ; Abdominal pain [ ] ; Constipation [ ] ; Diarrhea [ ] ; BRBPR [ ]   GU: Hematuria[ ] ; Dysuria [ ] ; Nocturia[ ]   Vascular: Pain in legs with walking [ ] ; Pain in feet with lying flat [ ] ; Non-healing sores [ ] ; Stroke [ ] ; TIA [ ] ; Slurred speech [ ] ;  Neuro: Headaches[ ] ; Vertigo[ ] ; Seizures[ ] ; Paresthesias[ ] ;Blurred vision [ ] ; Diplopia [ ] ; Vision changes [ ]   Ortho/Skin: Arthritis [ ] ; Joint pain [ ] ; Muscle pain [ ] ; Joint swelling [ ] ; Back Pain [ ] ; Rash [ ]   Psych: Depression[ ] ; Anxiety[ ]   Heme: Bleeding problems [ ] ; Clotting disorders [ ] ; Anemia [ ]   Endocrine: Diabetes [ ] ; Thyroid dysfunction[ ]    Past Medical History:  Diagnosis Date   Coronary artery disease    Diabetes mellitus without complication (HCC)    GERD (gastroesophageal reflux disease)    Hypertension    Stroke (cerebrum) (HCC)     Current Outpatient Medications  Medication Sig Dispense Refill   aspirin EC 81 MG tablet Take 1 tablet (81 mg total) by mouth daily. Swallow whole. 90 tablet 0   atorvastatin (LIPITOR) 80 MG tablet Take 1 tablet (80 mg total) by mouth daily at 6 PM. 90 tablet 0   buPROPion (WELLBUTRIN SR) 100 MG 12 hr tablet Take  100 mg by mouth 2 (two) times daily.      clopidogrel (PLAVIX) 75 MG tablet Take 1 tablet (75 mg total) by mouth daily. 90 tablet 0   dapagliflozin propanediol (FARXIGA) 10 MG TABS tablet Take 1 tablet (10 mg total) by mouth daily. 90 tablet 0   glimepiride (AMARYL) 4 MG tablet Take 1 tablet (4 mg total) by mouth daily with breakfast. 30 tablet 0   lisinopril (ZESTRIL) 40 MG tablet Take 1 tablet (40 mg total) by mouth daily. 90 tablet 0   metFORMIN (GLUCOPHAGE) 1000 MG tablet TAKE 1 TABLET BY MOUTH TWICE DAILY WITH MEALS 180 tablet 0   metoprolol succinate (TOPROL-XL) 25 MG 24 hr tablet Take 0.5 tablets (12.5 mg total) by mouth daily. 45 tablet 0   nicotine (NICODERM CQ - DOSED IN MG/24 HOURS) 21 mg/24hr  patch Place 1 patch (21 mg total) onto the skin daily. 28 patch 0   omeprazole (PRILOSEC) 20 MG capsule Take 20 mg by mouth daily.     spironolactone (ALDACTONE) 25 MG tablet Take 0.5 tablets (12.5 mg total) by mouth daily. 45 tablet 0   No current facility-administered medications for this visit.    No Known Allergies    Social History   Socioeconomic History   Marital status: Married    Spouse name: Not on file   Number of children: Not on file   Years of education: Not on file   Highest education level: Not on file  Occupational History   Not on file  Tobacco Use   Smoking status: Heavy Smoker    Current packs/day: 1.50    Types: Cigarettes   Smokeless tobacco: Current    Types: Chew   Tobacco comments:    on his own terms  Vaping Use   Vaping status: Never Used  Substance and Sexual Activity   Alcohol use: No    Alcohol/week: 0.0 standard drinks of alcohol    Comment: OCCASIONALLY   Drug use: No   Sexual activity: Not on file  Other Topics Concern   Not on file  Social History Narrative   Not on file   Social Determinants of Health   Financial Resource Strain: Not on file  Food Insecurity: No Food Insecurity (09/03/2022)   Hunger Vital Sign    Worried About Running Out of Food in the Last Year: Never true    Ran Out of Food in the Last Year: Never true  Transportation Needs: No Transportation Needs (09/03/2022)   PRAPARE - Administrator, Civil Service (Medical): No    Lack of Transportation (Non-Medical): No  Physical Activity: Not on file  Stress: Not on file  Social Connections: Not on file  Intimate Partner Violence: Not At Risk (09/03/2022)   Humiliation, Afraid, Rape, and Kick questionnaire    Fear of Current or Ex-Partner: No    Emotionally Abused: No    Physically Abused: No    Sexually Abused: No      Family History  Problem Relation Age of Onset   Heart attack Mother    Heart failure Mother    Coronary artery disease Father     Diabetes Father        PHYSICAL EXAM: General:  Well appearing. No respiratory difficulty HEENT: normal Neck: supple. no JVD. Carotids 2+ bilat; no bruits. No lymphadenopathy or thyromegaly appreciated. Cor: PMI nondisplaced. Regular rate & rhythm. No rubs, gallops or murmurs. Lungs: clear Abdomen: soft, nontender, nondistended. No hepatosplenomegaly. No bruits or  masses. Good bowel sounds. Extremities: no cyanosis, clubbing, rash, edema Neuro: alert & oriented x 3, cranial nerves grossly intact. moves all 4 extremities w/o difficulty. Affect pleasant.  ECG:   ASSESSMENT & PLAN:  1: Chronic heart failure with reduced ejection fraction- - suspect due to  - NYHA class - euvolemic - weighing daily - Echo 12/29/15: EF 30-35% along with mild Kenneth.  - Echo 02/20/17: EF 35-40% along with Grade II DD, trivial Kenneth, mild LAE and PA pressure of 34 mmHg - Echo 02/22/17: EF 45-50% - Echo 09/04/22: EF 20-25% along with Grade II DD, moderately elevated PA pressure and mild LAE/RAE - continue  - no BNP  2: HTN- - BP - saw PCP ([facility]) 10/23 - BMP 09/05/22 showed sodium 136, potassium 3.6, creatinine 0.95 & GFR >60  3: CAD- - saw cardiology - [facility] 09/04/22:   Ost RCA to Prox RCA lesion is 100% stenosed.   Prox RCA to Mid RCA lesion is 100% stenosed.   1st Mrg lesion is 100% stenosed.   A stent was successfully placed.   Post intervention, there is a 0% residual stenosis.   There is severe left ventricular systolic dysfunction.   LV end diastolic pressure is mildly elevated.   The left ventricular ejection fraction is less than 25% by visual estimate.   In the absence of any other complications or medical issues, we expect the patient to be ready for discharge from an interventional cardiology perspective on 09/05/2022.   Recommend uninterrupted dual antiplatelet therapy with Aspirin 81mg  daily and Ticagrelor 90mg  twice daily for a minimum of 12 months (ACS-Class I recommendation).  4: DM- -  A1c 09/03/22 was 11.4%   Kenneth Freeze, FNP 09/10/22

## 2022-09-10 NOTE — Consult Note (Signed)
CARDIOLOGY CONSULT NOTE               Patient ID: Kenneth [name] MRN: [id number] DOB/AGE: Feb 09, 1965 57 y.o.  Admit date: 09/03/2022 Referring Physician Dr. Andrena Mews hospitalist Primary Physician [facility] Primary Cardiologist [place] [facility] Reason for Consultation unstable angina coronary artery disease possible non-STEMI  HPI: Patient 57 year old white male smoker hypertension hyperlipidemia known coronary disease previous PCI and stent CVA presenting with left bundle branch block old with unstable anginal symptoms possible non-STEMI.  Patient had recurrent chest pain over the last several days central chest area nonradiating 7 out of 10 in intensity but finally came to emergency room for evaluation EKG showed left bundle which had been previously identified symptoms improved with therapy in the emergency room which improved down from 7 over 10-2/10 further evaluation with then recommended functional study versus cardiac cath with patient.  Persistent recurrent symptoms invasive strategy was most likely  Review of systems complete and found to be negative unless listed above     Past Medical History:  Diagnosis Date   Coronary artery disease    Diabetes mellitus without complication (HCC)    GERD (gastroesophageal reflux disease)    Hypertension    Stroke (cerebrum) (HCC)     Past Surgical History:  Procedure Laterality Date   CORONARY ANGIOPLASTY WITH STENT PLACEMENT  06/18/2008   CORONARY ANGIOPLASTY WITH STENT PLACEMENT     CORONARY STENT INTERVENTION N/A 09/04/2022   Procedure: CORONARY STENT INTERVENTION;  Surgeon: Alwyn Pea, MD;  Location: [facility] INVASIVE CV LAB;  Service: Cardiovascular;  Laterality: N/A;   LEFT HEART CATH AND CORONARY ANGIOGRAPHY N/A 09/04/2022   Procedure: LEFT HEART CATH AND CORONARY ANGIOGRAPHY;  Surgeon: Alwyn Pea, MD;  Location: [facility] INVASIVE CV LAB;  Service: Cardiovascular;  Laterality: N/A;   SHOULDER  ARTHROSCOPY WITH ROTATOR CUFF REPAIR AND SUBACROMIAL DECOMPRESSION Right 04/19/2017   Procedure: SHOULDER ARTHROSCOPY WITH ROTATOR CUFF REPAIR AND SUBACROMIAL DECOMPRESSION;  Surgeon: Signa Kell, MD;  Location: [facility] ORS;  Service: Orthopedics;  Laterality: Right;  distal clavical excision, bicepts tenotomy    No medications prior to admission.   Social History   Socioeconomic History   Marital status: Married    Spouse name: Not on file   Number of children: Not on file   Years of education: Not on file   Highest education level: Not on file  Occupational History   Not on file  Tobacco Use   Smoking status: Heavy Smoker    Current packs/day: 1.50    Types: Cigarettes   Smokeless tobacco: Current    Types: Chew   Tobacco comments:    on his own terms  Vaping Use   Vaping status: Never Used  Substance and Sexual Activity   Alcohol use: No    Alcohol/week: 0.0 standard drinks of alcohol    Comment: OCCASIONALLY   Drug use: No   Sexual activity: Not on file  Other Topics Concern   Not on file  Social History Narrative   Not on file   Social Determinants of Health   Financial Resource Strain: Not on file  Food Insecurity: No Food Insecurity (09/03/2022)   Hunger Vital Sign    Worried About Running Out of Food in the Last Year: Never true    Ran Out of Food in the Last Year: Never true  Transportation Needs: No Transportation Needs (09/03/2022)   PRAPARE - Administrator, Civil Service (Medical): No  Lack of Transportation (Non-Medical): No  Physical Activity: Not on file  Stress: Not on file  Social Connections: Not on file  Intimate Partner Violence: Not At Risk (09/03/2022)   Humiliation, Afraid, Rape, and Kick questionnaire    Fear of Current or Ex-Partner: No    Emotionally Abused: No    Physically Abused: No    Sexually Abused: No    Family History  Problem Relation Age of Onset   Heart attack Mother    Heart failure Mother    Coronary artery  disease Father    Diabetes Father       Review of systems complete and found to be negative unless listed above      PHYSICAL EXAM  General: Well developed, well nourished, in no acute distress HEENT:  Normocephalic and atramatic Neck:  No JVD.  Lungs: Clear bilaterally to auscultation and percussion. Heart: HRRR . Normal S1 and S2 without gallops or murmurs.  Abdomen: Bowel sounds are positive, abdomen soft and non-tender  Msk:  Back normal, normal gait. Normal strength and tone for age. Extremities: No clubbing, cyanosis or edema.   Neuro: Alert and oriented X 3. Psych:  Good affect, responds appropriately  Labs:   Lab Results  Component Value Date   WBC 12.0 (H) 09/05/2022   HGB 15.5 09/05/2022   HCT 45.3 09/05/2022   MCV 84.8 09/05/2022   PLT 291 09/05/2022    Recent Labs  Lab 09/05/22 0505  NA 136  K 3.6  CL 105  CO2 22  BUN 15  CREATININE 0.95  CALCIUM 8.8*  GLUCOSE 232*   Lab Results  Component Value Date   CKTOTAL 43 03/11/2017   TROPONINI 0.03 (HH) 03/05/2017    Lab Results  Component Value Date   CHOL 200 09/03/2022   CHOL 141 02/21/2017   CHOL 215 (H) 12/30/2015   Lab Results  Component Value Date   HDL 27 (L) 09/03/2022   HDL 27 (L) 02/21/2017   HDL NOT REPORTED DUE TO HIGH TRIGLYCERIDES 12/30/2015   Lab Results  Component Value Date   LDLCALC 109 (H) 09/03/2022   LDLCALC 63 02/21/2017   LDLCALC UNABLE TO CALCULATE IF TRIGLYCERIDE OVER 400 mg/dL 82/95/6213   Lab Results  Component Value Date   TRIG 319 (H) 09/03/2022   TRIG 112 02/23/2017   TRIG 253 (H) 02/21/2017   Lab Results  Component Value Date   CHOLHDL 7.4 09/03/2022   CHOLHDL 5.2 02/21/2017   CHOLHDL NOT REPORTED DUE TO HIGH TRIGLYCERIDES 12/30/2015   No results found for: "LDLDIRECT"    Radiology: CARDIAC CATHETERIZATION  Result Date: 09/04/2022   Ost RCA to Prox RCA lesion is 100% stenosed.   Prox RCA to Mid RCA lesion is 100% stenosed.   1st Mrg lesion is 100%  stenosed.   A stent was successfully placed.   Post intervention, there is a 0% residual stenosis.   There is severe left ventricular systolic dysfunction.   LV end diastolic pressure is mildly elevated.   The left ventricular ejection fraction is less than 25% by visual estimate.   In the absence of any other complications or medical issues, we expect the patient to be ready for discharge from an interventional cardiology perspective on 09/05/2022.   Recommend uninterrupted dual antiplatelet therapy with Aspirin 81mg  daily and Ticagrelor 90mg  twice daily for a minimum of 12 months (ACS-Class I recommendation). Conclusion Cardiac cath because of non-STEMI Left ventriculogram Severely depressed left ventricular function globally ejection fraction of  around 20 to 25% left ventricular dilatation Coronaries Left main large minor irregularities LAD large minor irregularities Circumflex large 100% OM1 IRA TIMI 0 flow RCA large and percent occlusion proximally CTO totally occluded proximal stent Faint collaterals right to right left to right Intervention Successful PCI and stent to OM1 100 -> to 0% TIMI-3 flow restored from TIMI 0 IRA vessel Recommend maintain Brilinta aspirin for at least 12 months Anticipate discharge hopefully within 23 hours Follow-up with cardiology 1 to 2 weeks upon discharge Follow-up with cardiac rehab Recommend cardiomyopathy heart failure therapy ACE ARB Arni beta-blocker spironolactone possibly Farxiga   ECHOCARDIOGRAM COMPLETE  Result Date: 09/04/2022    ECHOCARDIOGRAM REPORT   Patient Name:   Kenneth [name] Diem Date of Exam: 09/04/2022 Medical Rec #:  [id number]    Height:       69.0 in Accession #:    [id number]   Weight:       180.1 lb Date of Birth:  13-Feb-1965    BSA:          1.976 m Patient Age:    56 years     BP:           128/69 mmHg Patient Gender: M            HR:           88 bpm. Exam Location:  [facility] Procedure: 2D Echo, 3D Echo, Cardiac Doppler, Color Doppler, Strain Analysis and             Intracardiac Opacification Agent Indications:     Chest Pain  History:         Patient has prior history of Echocardiogram examinations, most                  recent 02/22/2017. CAD and Acute MI, Stroke and TIA,                  Signs/Symptoms:Chest Pain; Risk Factors:Hypertension, Diabetes,                  Dyslipidemia and Current Smoker.  Sonographer:     Mikki Harbor Referring Phys:  [phone] Francoise Schaumann [place] Diagnosing Phys: Alwyn Pea MD  Sonographer Comments: Global longitudinal strain was attempted. IMPRESSIONS  1. Left ventricular ejection fraction, by estimation, is 20 to 25%. The left ventricle has severely decreased function. The left ventricle demonstrates global hypokinesis. The left ventricular internal cavity size was severely dilated. Left ventricular diastolic parameters are consistent with Grade II diastolic dysfunction (pseudonormalization).  2. Right ventricular systolic function is mildly reduced. The right ventricular size is moderately enlarged. There is moderately elevated pulmonary artery systolic pressure.  3. Left atrial size was mildly dilated.  4. Right atrial size was mildly dilated.  5. The mitral valve is normal in structure. Trivial mitral valve regurgitation.  6. The aortic valve is grossly normal. Aortic valve regurgitation is not visualized. Aortic valve sclerosis/calcification is present, without any evidence of aortic stenosis. FINDINGS  Left Ventricle: Left ventricular ejection fraction, by estimation, is 20 to 25%. The left ventricle has severely decreased function. The left ventricle demonstrates global hypokinesis. Definity contrast agent was given IV to delineate the left ventricular endocardial borders. The left ventricular internal cavity size was severely dilated. There is no left ventricular hypertrophy. Left ventricular diastolic parameters are consistent with Grade II diastolic dysfunction (pseudonormalization). Right Ventricle: The right ventricular size is  moderately enlarged. No increase in right ventricular wall thickness. Right ventricular systolic  function is mildly reduced. There is moderately elevated pulmonary artery systolic pressure. The tricuspid regurgitant velocity is 3.06 m/s, and with an assumed right atrial pressure of 8 mmHg, the estimated right ventricular systolic pressure is 45.5 mmHg. Left Atrium: Left atrial size was mildly dilated. Right Atrium: Right atrial size was mildly dilated. Pericardium: There is no evidence of pericardial effusion. Mitral Valve: The mitral valve is normal in structure. Trivial mitral valve regurgitation. MV peak gradient, 2.8 mmHg. The mean mitral valve gradient is 1.0 mmHg. Tricuspid Valve: The tricuspid valve is normal in structure. Tricuspid valve regurgitation is mild. Aortic Valve: The aortic valve is grossly normal. Aortic valve regurgitation is not visualized. Aortic valve sclerosis/calcification is present, without any evidence of aortic stenosis. Aortic valve mean gradient measures 2.0 mmHg. Aortic valve peak gradient measures 4.7 mmHg. Aortic valve area, by VTI measures 2.94 cm. Pulmonic Valve: The pulmonic valve was normal in structure. Pulmonic valve regurgitation is not visualized. Aorta: The ascending aorta was not well visualized. IAS/Shunts: No atrial level shunt detected by color flow Doppler.  LEFT VENTRICLE PLAX 2D LVIDd:         6.70 cm   Diastology LVIDs:         6.20 cm   LV e' medial:    5.00 cm/s LV PW:         1.10 cm   LV E/e' medial:  17.7 LV IVS:        1.20 cm   LV e' lateral:   7.51 cm/s LVOT diam:     2.00 cm   LV E/e' lateral: 11.8 LV SV:         56 LV SV Index:   28 LVOT Area:     3.14 cm  RIGHT VENTRICLE RV Basal diam:  2.95 cm RV Mid diam:    2.60 cm RV S prime:     12.60 cm/s TAPSE (M-mode): 2.6 cm LEFT ATRIUM           Index        RIGHT ATRIUM           Index LA diam:      4.40 cm 2.23 cm/m   RA Area:     12.80 cm LA Vol (A2C): 76.3 ml 38.61 ml/m  RA Volume:   30.00 ml  15.18  ml/m  AORTIC VALVE                    PULMONIC VALVE AV Area (Vmax):    2.86 cm     PV Vmax:       0.97 m/s AV Area (Vmean):   2.72 cm     PV Peak grad:  3.8 mmHg AV Area (VTI):     2.94 cm AV Vmax:           108.00 cm/s AV Vmean:          64.800 cm/s AV VTI:            0.189 m AV Peak Grad:      4.7 mmHg AV Mean Grad:      2.0 mmHg LVOT Vmax:         98.20 cm/s LVOT Vmean:        56.100 cm/s LVOT VTI:          0.177 m LVOT/AV VTI ratio: 0.94  AORTA Ao Root diam: 3.20 cm MITRAL VALVE               TRICUSPID  VALVE MV Area (PHT): 4.71 cm    TR Peak grad:   37.5 mmHg MV Area VTI:   2.71 cm    TR Vmax:        306.00 cm/s MV Peak grad:  2.8 mmHg MV Mean grad:  1.0 mmHg    SHUNTS MV Vmax:       0.83 m/s    Systemic VTI:  0.18 m MV Vmean:      49.1 cm/s   Systemic Diam: 2.00 cm MV Decel Time: 161 msec MV E velocity: 88.60 cm/s MV A velocity: 60.30 cm/s MV E/A ratio:  1.47 Alwyn Pea MD Electronically signed by Alwyn Pea MD Signature Date/Time: 09/04/2022/3:51:14 PM    Final    DG Chest Portable 1 View  Result Date: 09/03/2022 CLINICAL DATA:  Chest pain EXAM: PORTABLE CHEST 1 VIEW COMPARISON:  03/05/2017 FINDINGS: Monitor and defibrillator pads overlie the chest. Prominent heart size. Lungs remain clear. No focal pneumonia, edema, effusion or pneumothorax. Trachea midline. No osseous abnormality. IMPRESSION: No acute chest process Electronically Signed   By: Judie Petit.  Shick M.D.   On: 09/03/2022 09:00    EKG: Left bundle branch block with of 70  ASSESSMENT AND PLAN:  Unstable angina Non-STEMI Coronary artery disease with PCI and stent Cardiomyopathy Diabetes Hypertension GERD CVA Abnormal EKG Smoker Hyperlipidemia . Plan Agree with admit for unstable angina rule out myocardial infarction follow-up EKGs and troponins Probable non-STEMI with left bundle branch block will follow-up with echocardiogram follow-up EKGs troponins Anticoagulation with heparin Plavix aspirin Advised patient  refrain from tobacco abuse Hypertension management and control beta-blocker ACE or ARB Recommend moderate to high-dose statin therapy with Lipitor Crestor Reflux treatment with PPI with Protonix therapy Agree with diabetes management and control Recommend cardiac cath prior to discharge     Signed: Alwyn Pea MD 09/10/2022, 8:26 AM

## 2022-09-11 ENCOUNTER — Encounter: Payer: Self-pay | Admitting: Family

## 2022-09-11 ENCOUNTER — Telehealth: Payer: Self-pay | Admitting: Family

## 2022-09-11 NOTE — Telephone Encounter (Signed)
Patient did not show for his initial Heart Failure Clinic appointment on 09/11/22.

## 2022-10-08 ENCOUNTER — Other Ambulatory Visit: Payer: Self-pay

## 2022-10-08 ENCOUNTER — Other Ambulatory Visit: Payer: Self-pay | Admitting: Student in an Organized Health Care Education/Training Program

## 2022-10-15 ENCOUNTER — Other Ambulatory Visit: Payer: Self-pay | Admitting: Student in an Organized Health Care Education/Training Program

## 2022-10-15 ENCOUNTER — Other Ambulatory Visit: Payer: Self-pay

## 2022-10-16 ENCOUNTER — Other Ambulatory Visit (HOSPITAL_BASED_OUTPATIENT_CLINIC_OR_DEPARTMENT_OTHER): Payer: Self-pay

## 2022-12-04 ENCOUNTER — Other Ambulatory Visit: Payer: Self-pay

## 2022-12-08 ENCOUNTER — Other Ambulatory Visit (HOSPITAL_COMMUNITY): Payer: Self-pay

## 2022-12-11 ENCOUNTER — Other Ambulatory Visit (HOSPITAL_COMMUNITY): Payer: Self-pay

## 2022-12-11 ENCOUNTER — Other Ambulatory Visit: Payer: Self-pay

## 2023-01-29 ENCOUNTER — Other Ambulatory Visit: Payer: Self-pay

## 2023-01-29 ENCOUNTER — Other Ambulatory Visit: Payer: Self-pay | Admitting: Student in an Organized Health Care Education/Training Program

## 2023-02-18 ENCOUNTER — Other Ambulatory Visit
Admission: RE | Admit: 2023-02-18 | Discharge: 2023-02-18 | Disposition: A | Discharge status: Home / Self Care | Payer: Self-pay | Source: Ambulatory Visit | Attending: Family Medicine | Admitting: Family Medicine

## 2023-02-18 DIAGNOSIS — R0789 Other chest pain: Secondary | ICD-10-CM | POA: Insufficient documentation

## 2023-02-18 DIAGNOSIS — R0602 Shortness of breath: Secondary | ICD-10-CM | POA: Insufficient documentation

## 2023-02-18 LAB — BRAIN NATRIURETIC PEPTIDE: B Natriuretic Peptide: 697.3 pg/mL — ABNORMAL HIGH (ref 0.0–100.0)

## 2023-02-18 LAB — D-DIMER, QUANTITATIVE: D-Dimer, Quant: 1.53 ug{FEU}/mL — ABNORMAL HIGH (ref 0.00–0.50)

## 2023-02-19 ENCOUNTER — Other Ambulatory Visit: Payer: Self-pay | Admitting: Family Medicine

## 2023-02-19 DIAGNOSIS — R0789 Other chest pain: Secondary | ICD-10-CM

## 2023-02-19 DIAGNOSIS — R0602 Shortness of breath: Secondary | ICD-10-CM

## 2023-02-25 ENCOUNTER — Ambulatory Visit
Admission: RE | Admit: 2023-02-25 | Discharge: 2023-02-25 | Disposition: A | Discharge status: Home / Self Care | Payer: Self-pay | Source: Ambulatory Visit | Attending: Family Medicine | Admitting: Family Medicine

## 2023-02-25 DIAGNOSIS — R0602 Shortness of breath: Secondary | ICD-10-CM | POA: Insufficient documentation

## 2023-02-25 DIAGNOSIS — R0789 Other chest pain: Secondary | ICD-10-CM | POA: Insufficient documentation

## 2023-02-25 MED ORDER — IOHEXOL 350 MG/ML SOLN
75.0000 mL | Freq: Once | INTRAVENOUS | Status: AC | PRN
  Administered 2023-02-25: 75 mL via INTRAVENOUS

## 2023-03-25 ENCOUNTER — Emergency Department: Payer: Self-pay

## 2023-03-25 ENCOUNTER — Emergency Department
Admission: EM | Admit: 2023-03-25 | Discharge: 2023-03-25 | Disposition: A | Discharge status: Home / Self Care | Payer: Self-pay | Attending: Emergency Medicine | Admitting: Emergency Medicine

## 2023-03-25 ENCOUNTER — Other Ambulatory Visit: Payer: Self-pay

## 2023-03-25 DIAGNOSIS — I10 Essential (primary) hypertension: Secondary | ICD-10-CM | POA: Insufficient documentation

## 2023-03-25 DIAGNOSIS — I2510 Atherosclerotic heart disease of native coronary artery without angina pectoris: Secondary | ICD-10-CM | POA: Insufficient documentation

## 2023-03-25 DIAGNOSIS — E119 Type 2 diabetes mellitus without complications: Secondary | ICD-10-CM | POA: Insufficient documentation

## 2023-03-25 DIAGNOSIS — R0602 Shortness of breath: Secondary | ICD-10-CM | POA: Insufficient documentation

## 2023-03-25 LAB — BASIC METABOLIC PANEL
Anion gap: 10 (ref 5–15)
BUN: 18 mg/dL (ref 6–20)
CO2: 23 mmol/L (ref 22–32)
Calcium: 9.2 mg/dL (ref 8.9–10.3)
Chloride: 105 mmol/L (ref 98–111)
Creatinine, Ser: 1.1 mg/dL (ref 0.61–1.24)
GFR, Estimated: >60 mL/min (ref >60)
Glucose, Bld: 254 mg/dL — ABNORMAL HIGH (ref 70–99)
Potassium: 4.4 mmol/L (ref 3.5–5.1)
Sodium: 138 mmol/L (ref 135–145)

## 2023-03-25 LAB — CBC
HCT: 47.6 % (ref 39.0–52.0)
Hemoglobin: 15.9 g/dL (ref 13.0–17.0)
MCH: 29 pg (ref 26.0–34.0)
MCHC: 33.4 g/dL (ref 30.0–36.0)
MCV: 86.9 fL (ref 80.0–100.0)
Platelets: 251 10*3/uL (ref 150–400)
RBC: 5.48 MIL/uL (ref 4.22–5.81)
RDW: 13.9 % (ref 11.5–15.5)
WBC: 10.4 10*3/uL (ref 4.0–10.5)
nRBC: 0 % (ref 0.0–0.2)

## 2023-03-25 LAB — TROPONIN I (HIGH SENSITIVITY): Troponin I (High Sensitivity): 13 ng/L (ref <18)

## 2023-03-25 MED ORDER — IPRATROPIUM-ALBUTEROL 0.5-2.5 (3) MG/3ML IN SOLN: 3.0000 mL | RESPIRATORY_TRACT | 0 refills | Status: DC | PRN

## 2023-03-25 MED ORDER — COMPRESSOR/NEBULIZER MISC
1.0000 [IU] | Freq: Once | 0 refills | Status: AC
Start: 2023-03-25 — End: 2023-03-25

## 2023-03-25 MED ORDER — PREDNISONE 10 MG (21) PO TBPK: ORAL_TABLET | ORAL | 0 refills | Status: DC

## 2023-03-25 MED ORDER — AEROCHAMBER MV MISC: 0 refills | Status: DC

## 2023-03-25 MED ORDER — PREDNISONE 20 MG PO TABS
60.0000 mg | ORAL_TABLET | Freq: Once | ORAL | Status: AC
  Administered 2023-03-25: 60 mg via ORAL
  Filled 2023-03-25: qty 3

## 2023-03-25 MED ORDER — IPRATROPIUM-ALBUTEROL 0.5-2.5 (3) MG/3ML IN SOLN
3.0000 mL | Freq: Once | RESPIRATORY_TRACT | Status: AC
  Administered 2023-03-25: 3 mL via RESPIRATORY_TRACT
  Filled 2023-03-25: qty 3

## 2023-03-25 NOTE — ED Provider Notes (Signed)
 [facility] Provider Note   Event Date/Time   First MD Initiated Contact with Patient 03/25/23 1311     (approximate) History  Shortness of Breath  HPI Kenneth [name] is a 58 y.o. male with stated past medical history of type 2 diabetes, hyperlipidemia, hypertension, and previous CVA as well as CAD with stent placement who presents complaining of 1 month of shortness of breath that initially improved with inhaler treatments as well as a steroid taper however over the past 2 days, patient has had worsening symptoms of nonproductive cough, shortness of breath, dyspnea on exertion. ROS: Patient currently denies any vision changes, tinnitus, difficulty speaking, facial droop, sore throat, chest pain, abdominal pain, nausea/vomiting/diarrhea, dysuria, or weakness/numbness/paresthesias in any extremity   Physical Exam  Triage Vital Signs: ED Triage Vitals [03/25/23 1137]  Encounter Vitals Group     BP (!) 124/93     Systolic BP Percentile      Diastolic BP Percentile      Pulse Rate 99     Resp 17     Temp 98 F (36.7 C)     Temp Source Oral     SpO2 100 %     Weight 168 lb (76.2 kg)     Height 5\' 9"  (1.753 m)     Head Circumference      Peak Flow      Pain Score 0     Pain Loc      Pain Education      Exclude from Growth Chart    Most recent vital signs: Vitals:   03/25/23 1137  BP: (!) 124/93  Pulse: 99  Resp: 17  Temp: 98 F (36.7 C)  SpO2: 100%   General: Awake, oriented x4. CV:  Good peripheral perfusion.  Resp:  Increased effort.  Tachypnea.  Mild rales over bilateral lower lung fields Abd:  No distention.  Other:  Middle-aged well-developed, well-nourished Caucasian male resting on stretcher in respiratory distress ED Results / Procedures / Treatments  Labs (all labs ordered are listed, but only abnormal results are displayed) Labs Reviewed  BASIC METABOLIC PANEL - Abnormal; Notable for the following components:      Result Value    Glucose, Bld 254 (*)    All other components within normal limits  CBC  TROPONIN I (HIGH SENSITIVITY)  TROPONIN I (HIGH SENSITIVITY)   EKG ED ECG REPORT I, Merwyn Katos, the attending physician, personally viewed and interpreted this ECG. Date: 03/25/2023 EKG Time: 1139 Rate: 97 Rhythm: normal sinus rhythm QRS Axis: normal Intervals: Left bundle branch block ST/T Wave abnormalities: normal Narrative Interpretation: Normal sinus rhythm with left bundle branch block.  No evidence of acute ischemia RADIOLOGY ED MD interpretation: 2 view chest x-ray interpreted by me shows no evidence of acute abnormalities including no pneumonia, pneumothorax, or widened mediastinum -Agree with radiology assessment Official radiology report(s): DG Chest 2 View Result Date: 03/25/2023 CLINICAL DATA:  Shortness of breath.  Weakness. EXAM: CHEST - 2 VIEW COMPARISON:  09/03/2022 FINDINGS: The heart size and mediastinal contours are within normal limits. Both lungs are clear. The visualized skeletal structures are unremarkable. IMPRESSION: No active cardiopulmonary disease. Electronically Signed   By: Danae Orleans M.D.   On: 03/25/2023 13:28   PROCEDURES: Critical Care performed: No Procedures MEDICATIONS ORDERED IN ED: Medications  ipratropium-albuterol (DUONEB) 0.5-2.5 (3) MG/3ML nebulizer solution 3 mL (3 mLs Nebulization Given 03/25/23 1403)  predniSONE (DELTASONE) tablet 60 mg (60 mg Oral Given 03/25/23  1403)   IMPRESSION / MDM / ASSESSMENT AND PLAN / ED COURSE  I reviewed the triage vital signs and the nursing notes.                             The patient is on the cardiac monitor to evaluate for evidence of arrhythmia and/or significant heart rate changes. Patient's presentation is most consistent with acute presentation with potential threat to life or bodily function. The patient appears to be suffering from a moderate exacerbation of wheezing and respiratory distress.  Based on the history,  exam, CXR/EKG, and further workup I don't suspect any other emergent cause of this presentation, such as pneumonia, acute coronary syndrome, congestive heart failure, pulmonary embolism, or pneumothorax.  ED Interventions: bronchodilators, steroids, antibiotics, reassess  Reassessment: After treatment, the patient's shortness of breath is resolved, and their lung exam has returned to baseline. They are comfortable and want to go home.  Rx: Steroids, Antibiotics, Albuterol, nebulizer Disposition: Discharge home with SRP. PCP follow up recommended in next 48hours.   FINAL CLINICAL IMPRESSION(S) / ED DIAGNOSES   Final diagnoses:  Shortness of breath   Rx / DC Orders   ED Discharge Orders          Ordered    ipratropium-albuterol (DUONEB) 0.5-2.5 (3) MG/3ML SOLN  Every 4 hours PRN        03/25/23 1427    predniSONE (STERAPRED UNI-PAK 21 TAB) 10 MG (21) TBPK tablet        03/25/23 1427    Spacer/Aero-Holding Chambers (AEROCHAMBER MV) inhaler        03/25/23 1427    Nebulizers (COMPRESSOR/NEBULIZER) MISC   Once        03/25/23 1427           Note:  This document was prepared using Dragon voice recognition software and may include unintentional dictation errors.   Merwyn Katos, MD 03/25/23 [phone]

## 2023-03-25 NOTE — ED Triage Notes (Signed)
 Pt sts that he his feeling weak and is not feeling any better. Pt sts that he was being treated for bronchitis vs pneumonia. Pt sts that it has been three weeks since than and he has not felt any better.

## 2023-04-02 ENCOUNTER — Other Ambulatory Visit (HOSPITAL_COMMUNITY): Payer: Self-pay

## 2023-04-09 ENCOUNTER — Encounter: Payer: Self-pay | Admitting: Emergency Medicine

## 2023-04-09 ENCOUNTER — Emergency Department: Payer: MEDICAID

## 2023-04-09 ENCOUNTER — Inpatient Hospital Stay
Admission: EM | Admit: 2023-04-09 | Discharge: 2023-04-12 | DRG: 286 | Disposition: A | Discharge status: Other Acute Inpatient Hospital | Payer: MEDICAID | Attending: Osteopathic Medicine | Admitting: Osteopathic Medicine

## 2023-04-09 ENCOUNTER — Observation Stay
Admit: 2023-04-09 | Discharge: 2023-04-09 | Disposition: A | Discharge status: Home / Self Care | Payer: MEDICAID | Attending: Internal Medicine | Admitting: Internal Medicine

## 2023-04-09 ENCOUNTER — Other Ambulatory Visit: Payer: Self-pay

## 2023-04-09 DIAGNOSIS — Z6841 Body Mass Index (BMI) 40.0 and over, adult: Secondary | ICD-10-CM

## 2023-04-09 DIAGNOSIS — E1165 Type 2 diabetes mellitus with hyperglycemia: Secondary | ICD-10-CM | POA: Diagnosis present

## 2023-04-09 DIAGNOSIS — R072 Precordial pain: Secondary | ICD-10-CM | POA: Diagnosis present

## 2023-04-09 DIAGNOSIS — Z7982 Long term (current) use of aspirin: Secondary | ICD-10-CM

## 2023-04-09 DIAGNOSIS — Z91128 Patient's intentional underdosing of medication regimen for other reason: Secondary | ICD-10-CM

## 2023-04-09 DIAGNOSIS — J4489 Other specified chronic obstructive pulmonary disease: Secondary | ICD-10-CM | POA: Diagnosis present

## 2023-04-09 DIAGNOSIS — I513 Intracardiac thrombosis, not elsewhere classified: Secondary | ICD-10-CM | POA: Diagnosis present

## 2023-04-09 DIAGNOSIS — F17210 Nicotine dependence, cigarettes, uncomplicated: Secondary | ICD-10-CM | POA: Diagnosis present

## 2023-04-09 DIAGNOSIS — I25110 Atherosclerotic heart disease of native coronary artery with unstable angina pectoris: Secondary | ICD-10-CM | POA: Diagnosis present

## 2023-04-09 DIAGNOSIS — I5023 Acute on chronic systolic (congestive) heart failure: Secondary | ICD-10-CM | POA: Diagnosis present

## 2023-04-09 DIAGNOSIS — I5084 End stage heart failure: Secondary | ICD-10-CM | POA: Diagnosis present

## 2023-04-09 DIAGNOSIS — E1169 Type 2 diabetes mellitus with other specified complication: Secondary | ICD-10-CM

## 2023-04-09 DIAGNOSIS — Z955 Presence of coronary angioplasty implant and graft: Secondary | ICD-10-CM

## 2023-04-09 DIAGNOSIS — I2510 Atherosclerotic heart disease of native coronary artery without angina pectoris: Secondary | ICD-10-CM | POA: Diagnosis present

## 2023-04-09 DIAGNOSIS — F419 Anxiety disorder, unspecified: Secondary | ICD-10-CM | POA: Diagnosis present

## 2023-04-09 DIAGNOSIS — Z7984 Long term (current) use of oral hypoglycemic drugs: Secondary | ICD-10-CM

## 2023-04-09 DIAGNOSIS — R7989 Other specified abnormal findings of blood chemistry: Secondary | ICD-10-CM

## 2023-04-09 DIAGNOSIS — I429 Cardiomyopathy, unspecified: Secondary | ICD-10-CM | POA: Diagnosis present

## 2023-04-09 DIAGNOSIS — I272 Pulmonary hypertension, unspecified: Secondary | ICD-10-CM | POA: Diagnosis present

## 2023-04-09 DIAGNOSIS — Z8673 Personal history of transient ischemic attack (TIA), and cerebral infarction without residual deficits: Secondary | ICD-10-CM

## 2023-04-09 DIAGNOSIS — R791 Abnormal coagulation profile: Secondary | ICD-10-CM | POA: Diagnosis present

## 2023-04-09 DIAGNOSIS — I493 Ventricular premature depolarization: Secondary | ICD-10-CM | POA: Diagnosis present

## 2023-04-09 DIAGNOSIS — I472 Ventricular tachycardia, unspecified: Secondary | ICD-10-CM | POA: Diagnosis not present

## 2023-04-09 DIAGNOSIS — I200 Unstable angina: Principal | ICD-10-CM

## 2023-04-09 DIAGNOSIS — I447 Left bundle-branch block, unspecified: Secondary | ICD-10-CM | POA: Diagnosis present

## 2023-04-09 DIAGNOSIS — Z7901 Long term (current) use of anticoagulants: Secondary | ICD-10-CM

## 2023-04-09 DIAGNOSIS — Z8249 Family history of ischemic heart disease and other diseases of the circulatory system: Secondary | ICD-10-CM

## 2023-04-09 DIAGNOSIS — R739 Hyperglycemia, unspecified: Secondary | ICD-10-CM

## 2023-04-09 DIAGNOSIS — I509 Heart failure, unspecified: Secondary | ICD-10-CM

## 2023-04-09 DIAGNOSIS — E785 Hyperlipidemia, unspecified: Secondary | ICD-10-CM | POA: Diagnosis present

## 2023-04-09 DIAGNOSIS — I110 Hypertensive heart disease with heart failure: Principal | ICD-10-CM | POA: Diagnosis present

## 2023-04-09 DIAGNOSIS — I252 Old myocardial infarction: Secondary | ICD-10-CM

## 2023-04-09 DIAGNOSIS — Z794 Long term (current) use of insulin: Secondary | ICD-10-CM

## 2023-04-09 DIAGNOSIS — J439 Emphysema, unspecified: Secondary | ICD-10-CM | POA: Diagnosis present

## 2023-04-09 DIAGNOSIS — Z833 Family history of diabetes mellitus: Secondary | ICD-10-CM

## 2023-04-09 DIAGNOSIS — Z79899 Other long term (current) drug therapy: Secondary | ICD-10-CM

## 2023-04-09 LAB — PROTIME-INR
INR: 1.1 (ref 0.8–1.2)
Prothrombin Time: 14.7 s (ref 11.4–15.2)

## 2023-04-09 LAB — COMPREHENSIVE METABOLIC PANEL
ALT: 40 U/L (ref 0–44)
AST: 29 U/L (ref 15–41)
Albumin: 3 g/dL — ABNORMAL LOW (ref 3.5–5.0)
Alkaline Phosphatase: 90 U/L (ref 38–126)
Anion gap: 10 (ref 5–15)
BUN: 23 mg/dL — ABNORMAL HIGH (ref 6–20)
CO2: 21 mmol/L — ABNORMAL LOW (ref 22–32)
Calcium: 8.8 mg/dL — ABNORMAL LOW (ref 8.9–10.3)
Chloride: 102 mmol/L (ref 98–111)
Creatinine, Ser: 1.17 mg/dL (ref 0.61–1.24)
GFR, Estimated: >60 mL/min (ref >60)
Glucose, Bld: 349 mg/dL — ABNORMAL HIGH (ref 70–99)
Potassium: 4.5 mmol/L (ref 3.5–5.1)
Sodium: 133 mmol/L — ABNORMAL LOW (ref 135–145)
Total Bilirubin: 1.9 mg/dL — ABNORMAL HIGH (ref 0.0–1.2)
Total Protein: 6 g/dL — ABNORMAL LOW (ref 6.5–8.1)

## 2023-04-09 LAB — ECHOCARDIOGRAM COMPLETE
AR max vel: 3.38 cm2
AV Area VTI: 3.71 cm2
AV Area mean vel: 3.23 cm2
AV Mean grad: 1 mmHg
AV Peak grad: 1.5 mmHg
Ao pk vel: 0.61 m/s
Area-P 1/2: 6.83 cm2
Calc EF: 5.4 %
Height: 69 in
S' Lateral: 6.5 cm
Single Plane A2C EF: 6.4 %
Single Plane A4C EF: 7.2 %
Weight: 2688 [oz_av]

## 2023-04-09 LAB — URINE DRUG SCREEN, QUALITATIVE (ARMC ONLY)
Amphetamines, Ur Screen: NOT DETECTED
Barbiturates, Ur Screen: NOT DETECTED
Benzodiazepine, Ur Scrn: NOT DETECTED
Cannabinoid 50 Ng, Ur ~~LOC~~: NOT DETECTED
Cocaine Metabolite,Ur ~~LOC~~: NOT DETECTED
MDMA (Ecstasy)Ur Screen: NOT DETECTED
Methadone Scn, Ur: NOT DETECTED
Opiate, Ur Screen: NOT DETECTED
Phencyclidine (PCP) Ur S: NOT DETECTED
Tricyclic, Ur Screen: POSITIVE — AB

## 2023-04-09 LAB — CBC WITH DIFFERENTIAL/PLATELET
Abs Immature Granulocytes: 0.04 10*3/uL (ref 0.00–0.07)
Basophils Absolute: 0.1 10*3/uL (ref 0.0–0.1)
Basophils Relative: 1 %
Eosinophils Absolute: 0 10*3/uL (ref 0.0–0.5)
Eosinophils Relative: 0 %
HCT: 46.5 % (ref 39.0–52.0)
Hemoglobin: 15.4 g/dL (ref 13.0–17.0)
Immature Granulocytes: 0 %
Lymphocytes Relative: 20 %
Lymphs Abs: 2 10*3/uL (ref 0.7–4.0)
MCH: 29.2 pg (ref 26.0–34.0)
MCHC: 33.1 g/dL (ref 30.0–36.0)
MCV: 88.2 fL (ref 80.0–100.0)
Monocytes Absolute: 0.4 10*3/uL (ref 0.1–1.0)
Monocytes Relative: 4 %
Neutro Abs: 7.3 10*3/uL (ref 1.7–7.7)
Neutrophils Relative %: 75 %
Platelets: 215 10*3/uL (ref 150–400)
RBC: 5.27 MIL/uL (ref 4.22–5.81)
RDW: 15.4 % (ref 11.5–15.5)
WBC: 9.7 10*3/uL (ref 4.0–10.5)
nRBC: 0 % (ref 0.0–0.2)

## 2023-04-09 LAB — CBG MONITORING, ED
Glucose-Capillary: 80 mg/dL (ref 70–99)
Glucose-Capillary: 112 mg/dL — ABNORMAL HIGH (ref 70–99)
Glucose-Capillary: 212 mg/dL — ABNORMAL HIGH (ref 70–99)

## 2023-04-09 LAB — TROPONIN I (HIGH SENSITIVITY)
Troponin I (High Sensitivity): 22 ng/L — ABNORMAL HIGH (ref <18)
Troponin I (High Sensitivity): 21 ng/L — ABNORMAL HIGH (ref <18)

## 2023-04-09 LAB — BRAIN NATRIURETIC PEPTIDE: B Natriuretic Peptide: 1185.6 pg/mL — ABNORMAL HIGH (ref 0.0–100.0)

## 2023-04-09 LAB — HEMOGLOBIN A1C
Hgb A1c MFr Bld: 12 % — ABNORMAL HIGH (ref 4.8–5.6)
Mean Plasma Glucose: 297.7 mg/dL

## 2023-04-09 LAB — APTT: aPTT: 32 s (ref 24–36)

## 2023-04-09 LAB — GLUCOSE, CAPILLARY: Glucose-Capillary: 272 mg/dL — ABNORMAL HIGH (ref 70–99)

## 2023-04-09 MED ORDER — MORPHINE SULFATE (PF) 2 MG/ML IV SOLN: 2.0000 mg | INTRAVENOUS | Status: DC | PRN

## 2023-04-09 MED ORDER — ASPIRIN 81 MG PO TBEC
81.0000 mg | DELAYED_RELEASE_TABLET | Freq: Every day | ORAL | Status: DC
  Administered 2023-04-09 – 2023-04-12 (×4): 81 mg via ORAL
  Filled 2023-04-09 (×4): qty 1

## 2023-04-09 MED ORDER — NITROGLYCERIN 2 % TD OINT
0.5000 [in_us] | TOPICAL_OINTMENT | Freq: Once | TRANSDERMAL | Status: AC
  Administered 2023-04-09: 0.5 [in_us] via TOPICAL
  Filled 2023-04-09: qty 1

## 2023-04-09 MED ORDER — ATORVASTATIN CALCIUM 80 MG PO TABS
80.0000 mg | ORAL_TABLET | Freq: Every day | ORAL | Status: DC
  Administered 2023-04-09 – 2023-04-11 (×3): 80 mg via ORAL
  Filled 2023-04-09 (×2): qty 1
  Filled 2023-04-09: qty 4

## 2023-04-09 MED ORDER — HYDROCODONE-ACETAMINOPHEN 5-325 MG PO TABS: 1.0000 | ORAL_TABLET | ORAL | Status: DC | PRN

## 2023-04-09 MED ORDER — MELATONIN 5 MG PO TABS
2.5000 mg | ORAL_TABLET | Freq: Once | ORAL | Status: AC
  Administered 2023-04-09: 2.5 mg via ORAL
  Filled 2023-04-09: qty 1

## 2023-04-09 MED ORDER — SPIRONOLACTONE 12.5 MG HALF TABLET
12.5000 mg | ORAL_TABLET | Freq: Every day | ORAL | Status: DC
  Administered 2023-04-09 – 2023-04-12 (×4): 12.5 mg via ORAL
  Filled 2023-04-09 (×4): qty 1

## 2023-04-09 MED ORDER — METOPROLOL SUCCINATE ER 25 MG PO TB24
12.5000 mg | ORAL_TABLET | Freq: Every day | ORAL | Status: DC
  Administered 2023-04-10 – 2023-04-12 (×3): 12.5 mg via ORAL
  Filled 2023-04-09 (×4): qty 1

## 2023-04-09 MED ORDER — ACETAMINOPHEN 325 MG PO TABS: 650.0000 mg | ORAL_TABLET | Freq: Four times a day (QID) | ORAL | Status: DC | PRN

## 2023-04-09 MED ORDER — ACETAMINOPHEN 650 MG RE SUPP: 650.0000 mg | Freq: Four times a day (QID) | RECTAL | Status: DC | PRN

## 2023-04-09 MED ORDER — ALBUTEROL SULFATE (2.5 MG/3ML) 0.083% IN NEBU: 2.5000 mg | INHALATION_SOLUTION | RESPIRATORY_TRACT | Status: AC | PRN

## 2023-04-09 MED ORDER — LISINOPRIL 20 MG PO TABS
40.0000 mg | ORAL_TABLET | Freq: Every day | ORAL | Status: DC
  Filled 2023-04-09: qty 2
  Filled 2023-04-09: qty 4

## 2023-04-09 MED ORDER — NICOTINE 21 MG/24HR TD PT24
21.0000 mg | MEDICATED_PATCH | Freq: Every day | TRANSDERMAL | Status: DC
  Administered 2023-04-10: 21 mg via TRANSDERMAL
  Filled 2023-04-09 (×4): qty 1

## 2023-04-09 MED ORDER — ISOSORBIDE MONONITRATE ER 30 MG PO TB24
30.0000 mg | ORAL_TABLET | Freq: Every day | ORAL | Status: DC
  Administered 2023-04-09 – 2023-04-12 (×4): 30 mg via ORAL
  Filled 2023-04-09 (×4): qty 1

## 2023-04-09 MED ORDER — NITROGLYCERIN 0.4 MG SL SUBL: 0.4000 mg | SUBLINGUAL_TABLET | SUBLINGUAL | Status: DC | PRN

## 2023-04-09 MED ORDER — BUPROPION HCL ER (SR) 100 MG PO TB12: 100.0000 mg | ORAL_TABLET | Freq: Two times a day (BID) | ORAL | Status: DC

## 2023-04-09 MED ORDER — HEPARIN BOLUS VIA INFUSION
5000.0000 [IU] | Freq: Once | INTRAVENOUS | Status: AC
  Administered 2023-04-09: 5000 [IU] via INTRAVENOUS
  Filled 2023-04-09: qty 5000

## 2023-04-09 MED ORDER — FUROSEMIDE 10 MG/ML IJ SOLN
40.0000 mg | Freq: Every day | INTRAMUSCULAR | Status: DC
  Administered 2023-04-09: 40 mg via INTRAVENOUS
  Filled 2023-04-09 (×2): qty 4

## 2023-04-09 MED ORDER — ONDANSETRON HCL 4 MG PO TABS: 4.0000 mg | ORAL_TABLET | Freq: Four times a day (QID) | ORAL | Status: DC | PRN

## 2023-04-09 MED ORDER — MORPHINE SULFATE (PF) 4 MG/ML IV SOLN: 4.0000 mg | Freq: Once | INTRAVENOUS | Status: DC

## 2023-04-09 MED ORDER — INSULIN ASPART 100 UNIT/ML IJ SOLN
10.0000 [IU] | Freq: Once | INTRAMUSCULAR | Status: AC
  Administered 2023-04-09: 10 [IU] via SUBCUTANEOUS
  Filled 2023-04-09: qty 1

## 2023-04-09 MED ORDER — PANTOPRAZOLE SODIUM 40 MG PO TBEC
40.0000 mg | DELAYED_RELEASE_TABLET | Freq: Every day | ORAL | Status: DC
  Administered 2023-04-09 – 2023-04-12 (×4): 40 mg via ORAL
  Filled 2023-04-09 (×4): qty 1

## 2023-04-09 MED ORDER — HEPARIN (PORCINE) 25000 UT/250ML-% IV SOLN
1350.0000 [IU]/h | INTRAVENOUS | Status: DC
  Administered 2023-04-09 – 2023-04-11 (×3): 1350 [IU]/h via INTRAVENOUS
  Filled 2023-04-09 (×3): qty 250

## 2023-04-09 MED ORDER — ONDANSETRON HCL 4 MG/2ML IJ SOLN: 4.0000 mg | Freq: Four times a day (QID) | INTRAMUSCULAR | Status: DC | PRN

## 2023-04-09 MED ORDER — MELATONIN 5 MG PO TABS
2.5000 mg | ORAL_TABLET | Freq: Every day | ORAL | Status: DC
  Administered 2023-04-09 – 2023-04-11 (×3): 2.5 mg via ORAL
  Filled 2023-04-09 (×3): qty 1

## 2023-04-09 MED ORDER — GUAIFENESIN-DM 100-10 MG/5ML PO SYRP
5.0000 mL | ORAL_SOLUTION | ORAL | Status: DC | PRN
  Filled 2023-04-09: qty 5

## 2023-04-09 MED ORDER — INSULIN ASPART 100 UNIT/ML IJ SOLN
0.0000 [IU] | Freq: Three times a day (TID) | INTRAMUSCULAR | Status: DC
  Administered 2023-04-10: 3 [IU] via SUBCUTANEOUS
  Administered 2023-04-10: 2 [IU] via SUBCUTANEOUS
  Administered 2023-04-10 – 2023-04-11 (×2): 5 [IU] via SUBCUTANEOUS
  Administered 2023-04-11: 7 [IU] via SUBCUTANEOUS
  Administered 2023-04-11: 3 [IU] via SUBCUTANEOUS
  Administered 2023-04-12: 5 [IU] via SUBCUTANEOUS
  Administered 2023-04-12: 7 [IU] via SUBCUTANEOUS
  Filled 2023-04-09 (×8): qty 1

## 2023-04-09 MED ORDER — INSULIN ASPART 100 UNIT/ML IJ SOLN
0.0000 [IU] | Freq: Every day | INTRAMUSCULAR | Status: DC
  Administered 2023-04-09: 3 [IU] via SUBCUTANEOUS
  Administered 2023-04-10: 2 [IU] via SUBCUTANEOUS
  Administered 2023-04-11: 5 [IU] via SUBCUTANEOUS
  Filled 2023-04-09 (×3): qty 1

## 2023-04-09 NOTE — ED Notes (Signed)
 Pt not having pain at this time. Pt's lights dimmed and provided w/ warm blanket while awaiting results. Pt verbalized understanding of call light process if he starts having CP again or needs assistance. Visitor at bedside. Dolores Frame, MD made aware of all this. See orders.

## 2023-04-09 NOTE — Final Progress Note (Signed)
 Cardio notified echo - Large LV thrombus noted. I've started him on heparin drip.

## 2023-04-09 NOTE — ED Notes (Signed)
 Messaged pharmacy about medications that need to be verified.

## 2023-04-09 NOTE — ED Notes (Signed)
 1000 medications not given at this time due to medications not being verified. Pharmacy has been contacted and aware.

## 2023-04-09 NOTE — ED Notes (Signed)
 Pts visitor left bedside at this time. She plans return later in the morning. Pt provided w/ ice chips and warm blanket, repositioned in bed and lights dimmed for comfort. Call light within reach.

## 2023-04-09 NOTE — ED Provider Notes (Signed)
 [facility] Provider Note    Event Date/Time   First MD Initiated Contact with Patient 04/09/23 0115     (approximate)   History   Chest Pain   HPI  Kenneth [name] is a 58 y.o. male brought to the ED via EMS from home with a chief complaint of left-sided chest pain.  Patient with a history of CAD, non-STEMI, diabetes, hypertension, GERD who reports left-sided chest pain which began approximately 45 minutes prior to arrival.  Patient took 400 mg baby aspirin.  EMS administered nitroglycerin spray which brought patient's pain from 7 down to 3/10 and he is currently pain-free at the time of our interview and examination.  Denies associated fever/chills, abdominal pain, nausea/vomiting or dizziness.  Wife notes patient has been dyspneic on exertion recently.     Past Medical History   Past Medical History:  Diagnosis Date   Coronary artery disease    Diabetes mellitus without complication (HCC)    GERD (gastroesophageal reflux disease)    Hypertension    Stroke (cerebrum) [facility])      Active Problem List   Patient Active Problem List   Diagnosis Date Noted   NSTEMI (non-ST elevated myocardial infarction) (HCC) 09/03/2022   Tobacco abuse counseling 09/03/2022   History of CVA (cerebrovascular accident) 09/03/2022   Numbness and tingling in left arm 05/23/2017   Neck pain 05/23/2017   Acute pain of both shoulders 03/11/2017   Encounter for central line placement    Obstructive hydrocephalus (HCC)    Vertebral artery dissection (HCC)    Ischemic stroke (HCC) 02/20/2017   Type 2 diabetes mellitus with hyperlipidemia (HCC)    Uncontrolled type 2 diabetes mellitus with hyperglycemia (HCC)    Tobacco dependence    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) 12/30/2015   TIA (transient ischemic attack) 12/29/2015   Hypertension 06/30/2015   Arteriosclerosis of coronary artery 07/28/2014   Type 2 diabetes mellitus with  hyperglycemia (HCC) 07/28/2014   Esophageal reflux 07/28/2014   Hyperlipidemia 09/10/2006     Past Surgical History   Past Surgical History:  Procedure Laterality Date   CORONARY ANGIOPLASTY WITH STENT PLACEMENT  06/18/2008   CORONARY ANGIOPLASTY WITH STENT PLACEMENT     CORONARY STENT INTERVENTION N/A 09/04/2022   Procedure: CORONARY STENT INTERVENTION;  Surgeon: Alwyn Pea, MD;  Location: [facility] INVASIVE CV LAB;  Service: Cardiovascular;  Laterality: N/A;   LEFT HEART CATH AND CORONARY ANGIOGRAPHY N/A 09/04/2022   Procedure: LEFT HEART CATH AND CORONARY ANGIOGRAPHY;  Surgeon: Alwyn Pea, MD;  Location: [facility] INVASIVE CV LAB;  Service: Cardiovascular;  Laterality: N/A;   SHOULDER ARTHROSCOPY WITH ROTATOR CUFF REPAIR AND SUBACROMIAL DECOMPRESSION Right 04/19/2017   Procedure: SHOULDER ARTHROSCOPY WITH ROTATOR CUFF REPAIR AND SUBACROMIAL DECOMPRESSION;  Surgeon: Signa Kell, MD;  Location: [facility] ORS;  Service: Orthopedics;  Laterality: Right;  distal clavical excision, bicepts tenotomy     Home Medications   Prior to Admission medications   Medication Sig Start Date End Date Taking? Authorizing Provider  atorvastatin (LIPITOR) 80 MG tablet Take 1 tablet (80 mg total) by mouth daily at 6 PM. 09/05/22 12/04/22  Leeroy Bock, MD  [facility] SR) 100 MG 12 hr tablet Take 100 mg by mouth 2 (two) times daily.  06/29/15 04/12/17  [provider]  glimepiride (AMARYL) 4 MG tablet Take 1 tablet (4 mg total) by mouth daily with breakfast. 12/31/15   Ramonita Lab, MD  ipratropium-albuterol (DUONEB)  0.5-2.5 (3) MG/3ML SOLN Take 3 mLs by nebulization every 4 (four) hours as needed. 03/25/23 04/24/23  Merwyn Katos, MD  lisinopril (ZESTRIL) 40 MG tablet Take 1 tablet (40 mg total) by mouth daily. 09/05/22 12/04/22  Leeroy Bock, MD  metFORMIN (GLUCOPHAGE) 1000 MG tablet TAKE 1 TABLET BY MOUTH TWICE DAILY WITH MEALS 05/17/17   Anola Gurney, PA  metoprolol succinate  (TOPROL-XL) 25 MG 24 hr tablet Take 0.5 tablets (12.5 mg total) by mouth daily. 09/05/22 12/04/22  Leeroy Bock, MD  nicotine (NICODERM CQ - DOSED IN MG/24 HOURS) 21 mg/24hr patch Place 1 patch (21 mg total) onto the skin daily. 09/06/22   Leeroy Bock, MD  omeprazole (PRILOSEC) 20 MG capsule Take 20 mg by mouth daily. 01/23/22   [provider]  predniSONE (STERAPRED UNI-PAK 21 TAB) 10 MG (21) TBPK tablet As directed on packaging 03/25/23   Merwyn Katos, MD  Spacer/Aero-Holding Chambers (AEROCHAMBER MV) inhaler Use as instructed 03/25/23   Merwyn Katos, MD  spironolactone (ALDACTONE) 25 MG tablet Take 0.5 tablets (12.5 mg total) by mouth daily. 09/06/22 12/05/22  Leeroy Bock, MD     Allergies  Patient has no known allergies.   Family History   Family History  Problem Relation Age of Onset   Heart attack Mother    Heart failure Mother    Coronary artery disease Father    Diabetes Father      Physical Exam  Triage Vital Signs: ED Triage Vitals [04/09/23 0110]  Encounter Vitals Group     BP      Systolic BP Percentile      Diastolic BP Percentile      Pulse      Resp      Temp      Temp src      SpO2      Weight 168 lb (76.2 kg)     Height 5\' 9"  (1.753 m)     Head Circumference      Peak Flow      Pain Score 0     Pain Loc      Pain Education      Exclude from Growth Chart     Updated Vital Signs: BP (!) 130/98   Pulse (!) 106   Temp 98.2 F (36.8 C) (Oral)   Resp (!) 21   Ht 5\' 9"  (1.753 m)   Wt 76.2 kg   SpO2 99%   BMI 24.81 kg/m    General: Awake, mild distress.  CV:  RRR.  Good peripheral perfusion.  Resp:  Normal effort.  CTAB. Abd:  Nontender.  No distention.  Other:  Bilateral calves are supple and nontender.   ED Results / Procedures / Treatments  Labs (all labs ordered are listed, but only abnormal results are displayed) Labs Reviewed  COMPREHENSIVE METABOLIC PANEL - Abnormal; Notable for the following  components:      Result Value   Sodium 133 (*)    CO2 21 (*)    Glucose, Bld 349 (*)    BUN 23 (*)    Calcium 8.8 (*)    Total Protein 6.0 (*)    Albumin 3.0 (*)    Total Bilirubin 1.9 (*)    All other components within normal limits  BRAIN NATRIURETIC PEPTIDE - Abnormal; Notable for the following components:   B Natriuretic Peptide 1,185.6 (*)    All other components within normal limits  TROPONIN I (HIGH SENSITIVITY) -  Abnormal; Notable for the following components:   Troponin I (High Sensitivity) 22 (*)    All other components within normal limits  TROPONIN I (HIGH SENSITIVITY) - Abnormal; Notable for the following components:   Troponin I (High Sensitivity) 21 (*)    All other components within normal limits  CBC WITH DIFFERENTIAL/PLATELET     EKG  ED ECG REPORT I, Fahed Morten J, the attending physician, personally viewed and interpreted this ECG.   Date: 04/09/2023  EKG Time: 0121  Rate: 96  Rhythm: normal sinus rhythm  Axis: Normal  Intervals:nonspecific intraventricular conduction delay  ST&T Change: New T wave inversion in lead III from EKG dated 03/26/2023    RADIOLOGY I have independently visualized and interpreted patient's imaging study as well as noted the radiology interpretation:  Chest x-ray: Cardiomegaly  Official radiology report(s): DG Chest Port 1 View Result Date: 04/09/2023 CLINICAL DATA:  Chest pain EXAM: PORTABLE CHEST 1 VIEW COMPARISON:  03/25/2023 FINDINGS: Cardiomegaly. No confluent airspace opacities, effusions or edema. No acute bony abnormality. IMPRESSION: Cardiomegaly.  No active disease. Electronically Signed   By: Charlett Nose M.D.   On: 04/09/2023 01:36     PROCEDURES:  Critical Care performed: Yes, see critical care procedure note(s)  CRITICAL CARE Performed by: Irean Hong   Total critical care time: 30 minutes  Critical care time was exclusive of separately billable procedures and treating other patients.  Critical care  was necessary to treat or prevent imminent or life-threatening deterioration.  Critical care was time spent personally by me on the following activities: development of treatment plan with patient and/or surrogate as well as nursing, discussions with consultants, evaluation of patient's response to treatment, examination of patient, obtaining history from patient or surrogate, ordering and performing treatments and interventions, ordering and review of laboratory studies, ordering and review of radiographic studies, pulse oximetry and re-evaluation of patient's condition.   Marland Kitchen1-3 Lead EKG Interpretation  Performed by: Irean Hong, MD Authorized by: Irean Hong, MD     Interpretation: normal     ECG rate:  95   ECG rate assessment: normal     Rhythm: sinus rhythm     Ectopy: none     Conduction: normal   Comments:     Patient placed on cardiac monitor to evaluate for arrhythmias    MEDICATIONS ORDERED IN ED: Medications  nitroGLYCERIN (NITROGLYN) 2 % ointment 0.5 inch (0.5 inches Topical Given 04/09/23 0152)  insulin aspart (novoLOG) injection 10 Units (10 Units Subcutaneous Given 04/09/23 0302)  melatonin tablet 2.5 mg (2.5 mg Oral Given 04/09/23 0434)     IMPRESSION / MDM / ASSESSMENT AND PLAN / ED COURSE  I reviewed the triage vital signs and the nursing notes.                             58 year old male presenting with chest pain. Differential diagnosis includes, but is not limited to, ACS, aortic dissection, pulmonary embolism, cardiac tamponade, pneumothorax, pneumonia, pericarditis, myocarditis, GI-related causes including esophagitis/gastritis, and musculoskeletal chest wall pain.   I personally reviewed patient's records and note PCP office visit on 04/03/2023 for pulmonary emphysema.  Patient's presentation is most consistent with acute presentation with potential threat to life or bodily function.  The patient is on the cardiac monitor to evaluate for evidence of  arrhythmia and/or significant heart rate changes.  Will obtain cardiac panel, chest x-ray, apply nitroglycerin paste.  Will reassess.  Clinical Course  as of 04/09/23 0438  Tue Apr 09, 2023  0241 Initial troponin mildly elevated; elevated BNP.  Hyperglycemia without elevation of anion gap noted.  Patient chest pain-free currently.  Will consult hospitalist services for evaluation and admission. [JS]    Clinical Course User Index [JS] Irean Hong, MD     FINAL CLINICAL IMPRESSION(S) / ED DIAGNOSES   Final diagnoses:  Unstable angina (HCC)  Elevated troponin  Hyperglycemia  Elevated brain natriuretic peptide (BNP) level     Rx / DC Orders   ED Discharge Orders     None        Note:  This document was prepared using Dragon voice recognition software and may include unintentional dictation errors.   Irean Hong, MD 04/09/23 [phone]

## 2023-04-09 NOTE — ED Notes (Signed)
 Pt was c/o restlessness, see mar. Pt requesting to walk around room. Side rail left down on pt's stretcher so he can ambulate freely in room. Gait steady, NADN.

## 2023-04-09 NOTE — ED Notes (Signed)
 Echo at bedside

## 2023-04-09 NOTE — Consult Note (Signed)
 [facility] CLINIC CARDIOLOGY CONSULT NOTE       Patient ID: Kenneth [name] MRN: [id number] DOB/AGE: Nov 10, 1965 58 y.o.  Admit date: 04/09/2023 Referring Physician Dr. Delfino Lovett Primary Physician Sparks, Duane Lope, MD  Primary Cardiologist Dr. Melton Alar Reason for Consultation AoCHF, elevated troponins  HPI: Kenneth [name] is a 58 y.o. male  with a past medical history of CAD s/p remote PCI to RCA and PCI to OM1 in 2024, chronic HFrEF uncontrolled DM2, HLD, HTN, hx CVA, ongoing tobacco use who presented to the ED on 04/09/2023 for chest pain, shortness of breath. Cardiology was consulted for further evaluation.   Patient reports that for the last 3 weeks he has been having issues with lung congestion.  He states that this has been causing him to feel short of breath.  He says that because of this he decided to come off some of his medications.  Last night he had an episode of chest pain that was left-sided which she describes as "just some pain" and given this persisted he decided to come to the ED for further evaluation.  Workup in the ED notable for creatinine 1.17, potassium 4.5, sodium 133, WBC 9.7, hemoglobin 15.4.  BNP elevated at 1185.  Troponin trended 22 > 21.  Chest x-ray was relatively unremarkable.  EKG revealed sinus rhythm with chronic left bundle branch block, no acute ST-T changes.  At the time of my evaluation he is resting comfortably in hospital bed with wife present at bedside.  We discussed his symptoms in further detail.  He states that his chest pain has entirely resolved now, he denies any other recent episodes of chest pain.  States that he has had worsening shortness of breath with exertion.  Also reports that he has been having difficulty sleeping but not from orthopnea.  States his difficulty sleeping is because he has had a dry mouth.  Otherwise he denies any palpitations, lightheadedness, dizziness, syncopal episodes.  Review of systems complete and found to be negative  unless listed above    Past Medical History:  Diagnosis Date   Coronary artery disease    Diabetes mellitus without complication (HCC)    GERD (gastroesophageal reflux disease)    Hypertension    Stroke (cerebrum) (HCC)     Past Surgical History:  Procedure Laterality Date   CORONARY ANGIOPLASTY WITH STENT PLACEMENT  06/18/2008   CORONARY ANGIOPLASTY WITH STENT PLACEMENT     CORONARY STENT INTERVENTION N/A 09/04/2022   Procedure: CORONARY STENT INTERVENTION;  Surgeon: Alwyn Pea, MD;  Location: [facility] INVASIVE CV LAB;  Service: Cardiovascular;  Laterality: N/A;   LEFT HEART CATH AND CORONARY ANGIOGRAPHY N/A 09/04/2022   Procedure: LEFT HEART CATH AND CORONARY ANGIOGRAPHY;  Surgeon: Alwyn Pea, MD;  Location: [facility] INVASIVE CV LAB;  Service: Cardiovascular;  Laterality: N/A;   SHOULDER ARTHROSCOPY WITH ROTATOR CUFF REPAIR AND SUBACROMIAL DECOMPRESSION Right 04/19/2017   Procedure: SHOULDER ARTHROSCOPY WITH ROTATOR CUFF REPAIR AND SUBACROMIAL DECOMPRESSION;  Surgeon: Signa Kell, MD;  Location: [facility] ORS;  Service: Orthopedics;  Laterality: Right;  distal clavical excision, bicepts tenotomy    (Not in a hospital admission)  Social History   Socioeconomic History   Marital status: Married    Spouse name: Not on file   Number of children: Not on file   Years of education: Not on file   Highest education level: Not on file  Occupational History   Not on file  Tobacco Use   Smoking status: Heavy Smoker  Current packs/day: 1.50    Types: Cigarettes   Smokeless tobacco: Current    Types: Chew   Tobacco comments:    on his own terms  Vaping Use   Vaping status: Never Used  Substance and Sexual Activity   Alcohol use: No    Alcohol/week: 0.0 standard drinks of alcohol    Comment: OCCASIONALLY   Drug use: No   Sexual activity: Not on file  Other Topics Concern   Not on file  Social History Narrative   Not on file   Social Drivers of Health   Financial Resource  Strain: Low Risk  (02/18/2023)   Received from [facility]   Overall Financial Resource Strain (CARDIA)    Difficulty of Paying Living Expenses: Not hard at all  Food Insecurity: No Food Insecurity (02/18/2023)   Received from [facility]   Hunger Vital Sign    Worried About Running Out of Food in the Last Year: Never true    Ran Out of Food in the Last Year: Never true  Transportation Needs: No Transportation Needs (02/18/2023)   Received from [facility] - Transportation    In the past 12 months, has lack of transportation kept you from medical appointments or from getting medications?: No    Lack of Transportation (Non-Medical): No  Physical Activity: Not on file  Stress: Not on file  Social Connections: Not on file  Intimate Partner Violence: Not At Risk (09/03/2022)   Humiliation, Afraid, Rape, and Kick questionnaire    Fear of Current or Ex-Partner: No    Emotionally Abused: No    Physically Abused: No    Sexually Abused: No    Family History  Problem Relation Age of Onset   Heart attack Mother    Heart failure Mother    Coronary artery disease Father    Diabetes Father      Vitals:   04/09/23 0748 04/09/23 0903 04/09/23 0930 04/09/23 0933  BP:   113/88   Pulse: 94  60   Resp: 17  (!) 23 19  Temp:  97.7 F (36.5 C)    TempSrc:  Oral    SpO2: 99%  99%   Weight:      Height:        PHYSICAL EXAM General: Chronically ill appearing male, well nourished, in no acute distress. HEENT: Normocephalic and atraumatic. Neck: No JVD.  Lungs: Normal respiratory effort on room air. Clear bilaterally to auscultation. No wheezes, crackles, rhonchi.  Heart: HRRR. Normal S1 and S2 without gallops or murmurs.  Abdomen: Non-distended appearing.  Msk: Normal strength and tone for age. Extremities: Warm and well perfused. No clubbing, cyanosis. No edema.  Neuro: Alert and oriented X 3. Psych: Answers questions  appropriately.   Labs: Basic Metabolic Panel: Recent Labs    04/09/23 0121  NA 133*  K 4.5  CL 102  CO2 21*  GLUCOSE 349*  BUN 23*  CREATININE 1.17  CALCIUM 8.8*   Liver Function Tests: Recent Labs    04/09/23 0121  AST 29  ALT 40  ALKPHOS 90  BILITOT 1.9*  PROT 6.0*  ALBUMIN 3.0*   No results for input(s): "LIPASE", "AMYLASE" in the last 72 hours. CBC: Recent Labs    04/09/23 0121  WBC 9.7  NEUTROABS 7.3  HGB 15.4  HCT 46.5  MCV 88.2  PLT 215   Cardiac Enzymes: Recent Labs    04/09/23 0121 04/09/23 0307  TROPONINIHS  22* 21*   BNP: Recent Labs    04/09/23 0121  BNP 1,185.6*   D-Dimer: No results for input(s): "DDIMER" in the last 72 hours. Hemoglobin A1C: No results for input(s): "HGBA1C" in the last 72 hours. Fasting Lipid Panel: No results for input(s): "CHOL", "HDL", "LDLCALC", "TRIG", "CHOLHDL", "LDLDIRECT" in the last 72 hours. Thyroid Function Tests: No results for input(s): "TSH", "T4TOTAL", "T3FREE", "THYROIDAB" in the last 72 hours.  Invalid input(s): "FREET3" Anemia Panel: No results for input(s): "VITAMINB12", "FOLATE", "FERRITIN", "TIBC", "IRON", "RETICCTPCT" in the last 72 hours.   Radiology: [facility] Chest Port 1 View Result Date: 04/09/2023 CLINICAL DATA:  Chest pain EXAM: PORTABLE CHEST 1 VIEW COMPARISON:  03/25/2023 FINDINGS: Cardiomegaly. No confluent airspace opacities, effusions or edema. No acute bony abnormality. IMPRESSION: Cardiomegaly.  No active disease. Electronically Signed   By: Charlett Nose M.D.   On: 04/09/2023 01:36   DG Chest 2 View Result Date: 03/25/2023 CLINICAL DATA:  Shortness of breath.  Weakness. EXAM: CHEST - 2 VIEW COMPARISON:  09/03/2022 FINDINGS: The heart size and mediastinal contours are within normal limits. Both lungs are clear. The visualized skeletal structures are unremarkable. IMPRESSION: No active cardiopulmonary disease. Electronically Signed   By: Danae Orleans M.D.   On: 03/25/2023 13:28    ECHO  ordered  TELEMETRY reviewed by me 04/09/2023: sinus rhythm LBBB PVCs rate 90s  EKG reviewed by me: Sinus rhythm LBBB rate 96 bpm  Data reviewed by me 04/09/2023: last 24h vitals tele labs imaging I/O ED provider note, admission H&P  Principal Problem:   Precordial chest pain    ASSESSMENT AND PLAN:  Kenneth [name] is a 58 y.o. male  with a past medical history of CAD s/p remote PCI to RCA and PCI to OM1 in 2024, chronic HFrEF uncontrolled DM2, HLD, HTN, hx CVA, ongoing tobacco use who presented to the ED on 04/09/2023 for chest pain, shortness of breath. Cardiology was consulted for further evaluation.   # Acute on chronic HFrEF # Coronary artery disease # Chronic LBBB Longstanding history of cardiomyopathy dating back to 2017 (limited echo from 02/2017 showed EF 45-50%, from 12/2015 EF was 30-35% with mild LV dilation), previously on GDMT but history of noncompliance.  Echo last admission with EF down to 20-25%.  BNP this admission 1100, JVD on exam. -Will obtain repeat echo. -Start IV lasix 40 mg daily. -Stressed the importance of medication compliance. -Continue atorvastatin 80 mg daily and aspirin 81 mg daily. -GDMT with metoprolol XL 12.5 mg daily, lisinopril 40 mg daily, spironolactone 12.5 mg daily.  Sitter addition of SGLT2 as renal function allows. -Minimally elevated and flat trending troponin most consistent with demand/supply mismatch and not ACS  -Will need outpatient heart failure follow-up and if compliant with medications, referral to EP for ?CRT eval  # Tobacco abuse Strongly encouraged complete cessation, nicotine patches.  This patient's plan of care was discussed and created with Dr. Corky Sing and he is in agreement.  Signed: Gale Journey, PA-C  04/09/2023, 10:45 AM [facility]

## 2023-04-09 NOTE — Progress Notes (Signed)
 Heart Failure Stewardship Pharmacy Note  PCP: Marguarite Arbour, MD PCP-Cardiologist: None  HPI: Kenneth [name] is a 58 y.o. male with CAD s/p PCI in 08/2022, hypertension, GERD, type 2 diabetes, hyperlipidemia, and tobacco use who presented with worsening shortness of breath over several months. On admission, BNP was 1185.6, HS-troponin was 22 > 21, and A1c/UDS are pending. Chest x-ray noted no active disease.     Pertinent cardiac history: PCI performed to RCA in 06/2008. Experienced TIA in 12/2015. Echo 12/2015 showed LVEF reduced to 30-35%. Echo 01/2017 showed LVEF mildly improved to 35-40%. Right PICA infarct in 02/2017. Echo 02/2017 showed LVEF improved to 45-50%. Admitted 08/2022 for acute MI where DES was placed to OM1 and RCA with CTO and faint collaterals right to right and left to right. Echo at that time showed LVEF reduced to 20-25% with severely dilated LV, grade II diastolic dysfunction, mildly reduced RV function, mildly dilated atria.  Pertinent Lab Values: Creatinine  Date Value Ref Range Status  07/15/2013 1.14 0.60 - 1.30 mg/dL Final   Creatinine, Ser  Date Value Ref Range Status  04/09/2023 1.17 0.61 - 1.24 mg/dL Final   BUN  Date Value Ref Range Status  04/09/2023 23 (H) 6 - 20 mg/dL Final  95/18/8416 8 6 - 24 mg/dL Final  60/63/0160 13 7 - 18 mg/dL Final   Potassium  Date Value Ref Range Status  04/09/2023 4.5 3.5 - 5.1 mmol/L Final  07/15/2013 3.5 3.5 - 5.1 mmol/L Final   Sodium  Date Value Ref Range Status  04/09/2023 133 (L) 135 - 145 mmol/L Final  07/30/2014 142 134 - 144 mmol/L Final  07/15/2013 133 (L) 136 - 145 mmol/L Final   B Natriuretic Peptide  Date Value Ref Range Status  04/09/2023 1,185.6 (H) 0.0 - 100.0 pg/mL Final    Comment:    Performed at [facility], [address] [place], [place] [address]   Magnesium  Date Value Ref Range Status  09/03/2022 2.1 1.7 - 2.4 mg/dL Final    Comment:    Performed at [facility], [address] [place], [place] [address]   Hemoglobin A1C  Date Value Ref Range Status  07/15/2013 13.0 (H) 4.2 - 6.3 % Final    Comment:    The American Diabetes Association recommends that a primary goal of therapy should be <7% and that physicians should reevaluate the treatment regimen in patients with HbA1c values consistently >8%.    Hgb A1c MFr Bld  Date Value Ref Range Status  09/03/2022 11.4 (H) 4.8 - 5.6 % Final    Comment:    (NOTE)         Prediabetes: 5.7 - 6.4         Diabetes: >6.4         Glycemic control for adults with diabetes: <7.0    TSH  Date Value Ref Range Status  02/20/2017 0.765 0.350 - 4.500 uIU/mL Final    Comment:    Performed by a 3rd Generation assay with a functional sensitivity of <=0.01 uIU/mL.    Vital Signs: Temp:  [98 F (36.7 C)-98.2 F (36.8 C)] 98 F (36.7 C) (03/18 0500) Pulse Rate:  [94-106] 94 (03/18 0748) Cardiac Rhythm: Normal sinus rhythm;Bundle branch block (03/18 0117) Resp:  [17-23] 17 (03/18 0748) BP: (106-130)/(87-98) 106/89 (03/18 0735) SpO2:  [97 %-99 %] 99 % (03/18 0748) Weight:  [76.2 kg (168 lb)] 76.2 kg (168 lb) (03/18 0110) No intake or output  data in the 24 hours ending 04/09/23 0817  Current Heart Failure Medications:  Loop diuretic: furosemide 40 mg IV daily Beta-Blocker: metoprolol succinate 12.5 mg daily ACEI/ARB/ARNI: lisinopril 40 mg daily MRA: spironolactone 12.5 mg daily SGLT2i: none  Prior to admission Heart Failure Medications:  Loop diuretic: Beta-Blocker: metoprolol succinate 12.5 mg daily ACEI/ARB/ARNI: lisinopril 40 mg daily MRA: spironolactone 12.5 mg daily SGLT2i: none  Assessment: 1. Acute on chronic systolic heart failure (LVEF 08/2022 of 20-25%) with grade II diastolic dysfunction, mildly reduced RV function, due to ICM. NYHA class III symptoms.  -Symptoms: Reports persistent shortness of breath over the last few months, but state it is improved since presentation. He still  has labored breathing with minimal exertion. -Volume: Still short of breath, though no LEE or abdominal swelling. BNP was significantly elevated on admission, though chest XR did not show fluid. JVD is present. Agree with trial of IV furosemide.  -Hemodynamics: BP normal. CO2 is low, but extremities are warm and patient has strong appetite, low suspicion for low output at this time. -BB: Continue metoprolol succinate 12.5 mg daily -ACEI/ARB/ARNI: Continue lisinopril 40 mg daily. Could consider transition to [facility] with patient assistance if following up in clinic. -MRA: Continue spironolactone 12.5 mg daily.  -SGLT2i: Can consider adding SGLT2i if patient meets criteria for patient assistance. He would be able to receive a 30 day free card while awaiting PA approval.  Plan: 1) Medication changes recommended at this time: -Agree with IV furosemide   2) Patient assistance: -Patient is currently uninsured. He would likely qualify for patient assistance  3) Education: - Patient has been educated on current HF medications and potential additions to HF medication regimen - Patient verbalizes understanding that over the next few months, these medication doses may change and more medications may be added to optimize HF regimen - Patient has been educated on basic disease state pathophysiology and goals of therapy  Medication Assistance / Insurance Benefits Check: Does the patient have prescription insurance?    Type of insurance plan:  Does the patient qualify for medication assistance through manufacturers or grants? Pending  Eligible grants and/or patient assistance programs: [employer] [place]  Medication assistance applications in progress: none  Medication assistance applications approved: none Approved medication assistance renewals will be completed by: [facility] cardiology  Outpatient Pharmacy: Prior to admission outpatient pharmacy: [facility]      Please do not hesitate to  reach out with questions or concerns,  Enos Fling, PharmD, CPP, BCPS Heart Failure Pharmacist  Phone - [phone] 04/09/2023 11:14 AM

## 2023-04-09 NOTE — H&P (Addendum)
 History and Physical    Patient: Kenneth [name] QMV:[id number] DOB: 02/18/1965 DOA: 04/09/2023 DOS: the patient was seen and examined on 04/09/2023 PCP: Marguarite Arbour, MD  Patient coming from: Home  Chief Complaint:  Chief Complaint  Patient presents with   Chest Pain   HPI: Kenneth [name] is a 58 y.o. male with medical history significant of CAD s/p stenting on 08/2022 by Dr Juliann Pares, HTN, GERD, HLD, DM2, chronic bronchitis/tobacco use who was seen by [facility] PCP Cleda Clarks) on 04/03/23 for worsening shortness of breath over the last several months. He is scheduled to follow-up with pulmonology. He had increasing dry mouth, fatigue, poor appetite. Has been very anxious about stressful work. Also cutting back on smoking. He was started on nystatin (MYCOSTATIN) 100,000 unit/mL suspension; Swish and swallow 5 mLs 3 (three) times daily for 10 days due to dry mouth and possible oral candidiasis. 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 For his Anxiety, he was started on busPIRone (BUSPAR) 5 MG tablet; Take 1 tablet (5 mg total) by mouth 2 (two) times daily. He came to the ED due to ongoing left-sided chest pain which began approximately 45 minutes prior to arrival.  Patient took 400 mg baby aspirin.  EMS administered nitroglycerin spray which brought patient's pain from 7 down to 3/10 and he is currently pain-free. Wife reported that patient has been dyspneic on exertion recently. CXR in ED shows cardiomegaly but no active disease. Had recent neg respi panel on 3/12 and also CT Angio which r/o PE on 02/25/23. He's being admitted for further eval and mgmt. His BNP is elevated at 1185. He is being admitted for unstable angina, h/o CAD, CHF exacerbation. He's sleeping comfortably and has no further chest pain upon my evaluation.  Review of Systems: As mentioned in the history of present illness. All other systems reviewed and are  negative. Past Medical History:  Diagnosis Date   Coronary artery disease    Diabetes mellitus without complication (HCC)    GERD (gastroesophageal reflux disease)    Hypertension    Stroke (cerebrum) (HCC)    Past Surgical History:  Procedure Laterality Date   CORONARY ANGIOPLASTY WITH STENT PLACEMENT  06/18/2008   CORONARY ANGIOPLASTY WITH STENT PLACEMENT     CORONARY STENT INTERVENTION N/A 09/04/2022   Procedure: CORONARY STENT INTERVENTION;  Surgeon: Alwyn Pea, MD;  Location: [facility] INVASIVE CV LAB;  Service: Cardiovascular;  Laterality: N/A;   LEFT HEART CATH AND CORONARY ANGIOGRAPHY N/A 09/04/2022   Procedure: LEFT HEART CATH AND CORONARY ANGIOGRAPHY;  Surgeon: Alwyn Pea, MD;  Location: [facility] INVASIVE CV LAB;  Service: Cardiovascular;  Laterality: N/A;   SHOULDER ARTHROSCOPY WITH ROTATOR CUFF REPAIR AND SUBACROMIAL DECOMPRESSION Right 04/19/2017   Procedure: SHOULDER ARTHROSCOPY WITH ROTATOR CUFF REPAIR AND SUBACROMIAL DECOMPRESSION;  Surgeon: Signa Kell, MD;  Location: [facility] ORS;  Service: Orthopedics;  Laterality: Right;  distal clavical excision, bicepts tenotomy   Social History:  reports that he has been smoking cigarettes. His smokeless tobacco use includes chew. He reports that he does not drink alcohol and does not use drugs.  No Known Allergies  Family History  Problem Relation Age of Onset   Heart attack Mother    Heart failure Mother    Coronary artery disease Father    Diabetes Father     Prior to Admission medications   Medication Sig Start Date End Date Taking? Authorizing  Provider  atorvastatin (LIPITOR) 80 MG tablet Take 1 tablet (80 mg total) by mouth daily at 6 PM. 09/05/22 12/04/22  Leeroy Bock, MD  buPROPion [facility] SR) 100 MG 12 hr tablet Take 100 mg by mouth 2 (two) times daily.  06/29/15 04/12/17  [provider]  glimepiride (AMARYL) 4 MG tablet Take 1 tablet (4 mg total) by mouth daily with breakfast. 12/31/15   Gouru,  Aruna, MD  ipratropium-albuterol (DUONEB) 0.5-2.5 (3) MG/3ML SOLN Take 3 mLs by nebulization every 4 (four) hours as needed. 03/25/23 04/24/23  Merwyn Katos, MD  lisinopril (ZESTRIL) 40 MG tablet Take 1 tablet (40 mg total) by mouth daily. 09/05/22 12/04/22  Leeroy Bock, MD  metFORMIN (GLUCOPHAGE) 1000 MG tablet TAKE 1 TABLET BY MOUTH TWICE DAILY WITH MEALS 05/17/17   Anola Gurney, PA  metoprolol succinate (TOPROL-XL) 25 MG 24 hr tablet Take 0.5 tablets (12.5 mg total) by mouth daily. 09/05/22 12/04/22  Leeroy Bock, MD  nicotine (NICODERM CQ - DOSED IN MG/24 HOURS) 21 mg/24hr patch Place 1 patch (21 mg total) onto the skin daily. 09/06/22   Leeroy Bock, MD  omeprazole (PRILOSEC) 20 MG capsule Take 20 mg by mouth daily. 01/23/22   [provider]  predniSONE (STERAPRED UNI-PAK 21 TAB) 10 MG (21) TBPK tablet As directed on packaging 03/25/23   Merwyn Katos, MD  Spacer/Aero-Holding Chambers (AEROCHAMBER MV) inhaler Use as instructed 03/25/23   Merwyn Katos, MD  spironolactone (ALDACTONE) 25 MG tablet Take 0.5 tablets (12.5 mg total) by mouth daily. 09/06/22 12/05/22  Leeroy Bock, MD    Physical Exam: Vitals:   04/09/23 0113 04/09/23 0230 04/09/23 0500 04/09/23 0600  BP: 124/87 (!) 130/98 (!) 130/98 (!) 125/97  Pulse: 99 (!) 106 98 (!) 103  Resp: 20 (!) 21 20 (!) 23  Temp: 98.2 F (36.8 C)  98 F (36.7 C)   TempSrc: Oral  Oral   SpO2: 99% 99% 98% 97%  Weight:      Height:       General. Well appearing; NAD; VS reviewed  Neck. Supple. No thyromegaly, lymphadenopathy. Cardio: RRR, No murmurs Lungs. Respirations unlabored; Extremities: No edema. Skin. Normal color and turgor Neurologic. Alert and oriented x3   Data Reviewed:  BNP 1185  Assessment and Plan:  36 y m admitted for unstable angina  Unstable Angina Acute on chronic systolic CHF CAD s/p stenting in 08/24 Serial troponins Telemetry Cardio c/s - KC nitroglycerin, asa, statin Echo  repeat this admission Last echo in 08/24 showed EF 20 to 25%  Will request UDS  DM 2 SSI for now Check A1c  Pulmonary emphysema Continue nebulizer and inhalers. Will hold off steroids considering Insomnia  Stress/Anxiety  - Continue busPIRone (BUSPAR) 5 MG tablet; Take 1 tablet (5 mg total) by mouth 2 (two) times daily  Tobacco abuse Nicotine patch    Advance Care Planning:   Code Status: Full Code   Consults: Cardio Dr Corky Sing [facility]) aware  Family Communication: Wife updated at bedside  Severity of Illness: The appropriate patient status for this patient is OBSERVATION. Observation status is judged to be reasonable and necessary in order to provide the required intensity of service to ensure the patient's safety. The patient's presenting symptoms, physical exam findings, and initial radiographic and laboratory data in the context of their medical condition is felt to place them at decreased risk for further clinical deterioration. Furthermore, it is anticipated that the patient will be medically stable  for discharge from the hospital within 2 midnights of admission.   Author: Delfino Lovett, MD 04/09/2023 6:42 AM  For on call review [email or web address].

## 2023-04-09 NOTE — Progress Notes (Signed)
 PHARMACY - ANTICOAGULATION CONSULT NOTE  Pharmacy Consult for heparin infusion Indication: Large LV thrombus   No Known Allergies  Patient Measurements: Height: 5\' 9"  (175.3 cm) Weight: 76.2 kg (168 lb) IBW/kg (Calculated) : 70.7 Heparin Dosing Weight: 76.2 kg  Vital Signs: Temp: 98 F (36.7 C) (03/18 1800) Temp Source: Oral (03/18 1800) BP: 131/97 (03/18 1800) Pulse Rate: 92 (03/18 1800)  Labs: Recent Labs    04/09/23 0121 04/09/23 0307  HGB 15.4  --   HCT 46.5  --   PLT 215  --   CREATININE 1.17  --   TROPONINIHS 22* 21*    Estimated Creatinine Clearance: 68.8 mL/min (by C-G formula based on SCr of 1.17 mg/dL).   Medical History: Past Medical History:  Diagnosis Date   Coronary artery disease    Diabetes mellitus without complication (HCC)    GERD (gastroesophageal reflux disease)    Hypertension    Stroke (cerebrum) (HCC)     Medications:  Scheduled:   aspirin EC  81 mg Oral Daily   atorvastatin  80 mg Oral q1800   buPROPion ER  100 mg Oral BID   furosemide  40 mg Intravenous Daily   insulin aspart  0-5 Units Subcutaneous QHS   insulin aspart  0-9 Units Subcutaneous TID WC   isosorbide mononitrate  30 mg Oral Daily   lisinopril  40 mg Oral Daily   metoprolol succinate  12.5 mg Oral Daily   nicotine  21 mg Transdermal Daily   pantoprazole  40 mg Oral Daily   spironolactone  12.5 mg Oral Daily    Assessment: 58 y.o. male with CAD s/p PCI in 08/2022, hypertension, GERD, type 2 diabetes, hyperlipidemia, and tobacco use who presented with worsening shortness of breath over several months. Large LV thrombus noted on ECHO. No chronic anticoagulation noted based on dispense record  Baseline Labs: H&H/PLT wnl, INR/aPTT pending  Goal of Therapy:  Heparin level 0.3-0.7 units/ml Monitor platelets by anticoagulation protocol: Yes   Plan:  Give 5000 units bolus x 1 Start heparin infusion at 1350 units/hr Check anti-Xa level in 6 hours and daily while on  heparin Continue to monitor H&H and platelets  Lowella Bandy 04/09/2023,7:03 PM

## 2023-04-09 NOTE — Progress Notes (Signed)
 Heart Failure Navigator Progress Note  Assessed for Heart & Vascular TOC clinic readiness.  Patient does not meet criteria due to current [facility] patient.   Navigator will sign off at this time.  Roxy Horseman, RN, BSN [facility] Heart Failure Navigator Secure Chat Only

## 2023-04-09 NOTE — ED Notes (Signed)
 Patient denies chest pain and shortness of breath at this time. Patient complaining of lack of sleep for the past 3-4 weeks according to the patient and patients wife at bedside. Patient states he received Melatonin last night and thinks it has worn off. Patient ambulated from bedside chair to bed to lay down for sleep.

## 2023-04-09 NOTE — ED Triage Notes (Signed)
 Pt BIB EMS for CP that started 45 mins PTA. Pt took 400 mg of aspirin and EMS administered nitro spray, Pt reports pain went from a 7/10 to 3/10.

## 2023-04-09 NOTE — ED Notes (Signed)
 Patient sat up on the side of bed to eat lunch. Wife at bedside.

## 2023-04-09 NOTE — ED Notes (Signed)
 CCMD called for cardiac monitoring.

## 2023-04-09 NOTE — Progress Notes (Signed)
*  PRELIMINARY RESULTS* Echocardiogram 2D Echocardiogram has been performed.  Cristela Blue 04/09/2023, 2:50 PM

## 2023-04-09 NOTE — ED Notes (Addendum)
 Patient called RN into room to ask about eating something because the doctor told him he was having a procedure later today. RN unaware of procedure later today requiring NPO status. RN explained that he had a order for "NPO" meaning he couldn't have anything by mouth at this moment. Patient cursed at nurse stating he was not going to be starved. RN messaged MD and MD put in diet order stating he was unaware of procedure that would require NPO status.

## 2023-04-10 ENCOUNTER — Other Ambulatory Visit (HOSPITAL_COMMUNITY): Payer: Self-pay

## 2023-04-10 DIAGNOSIS — I2510 Atherosclerotic heart disease of native coronary artery without angina pectoris: Secondary | ICD-10-CM

## 2023-04-10 DIAGNOSIS — I509 Heart failure, unspecified: Secondary | ICD-10-CM

## 2023-04-10 LAB — CBC
HCT: 43.1 % (ref 39.0–52.0)
Hemoglobin: 15 g/dL (ref 13.0–17.0)
MCH: 29.7 pg (ref 26.0–34.0)
MCHC: 34.8 g/dL (ref 30.0–36.0)
MCV: 85.3 fL (ref 80.0–100.0)
Platelets: 212 10*3/uL (ref 150–400)
RBC: 5.05 MIL/uL (ref 4.22–5.81)
RDW: 15.2 % (ref 11.5–15.5)
WBC: 11.4 10*3/uL — ABNORMAL HIGH (ref 4.0–10.5)
nRBC: 0 % (ref 0.0–0.2)

## 2023-04-10 LAB — BASIC METABOLIC PANEL
Anion gap: 8 (ref 5–15)
BUN: 25 mg/dL — ABNORMAL HIGH (ref 6–20)
CO2: 26 mmol/L (ref 22–32)
Calcium: 8.7 mg/dL — ABNORMAL LOW (ref 8.9–10.3)
Chloride: 102 mmol/L (ref 98–111)
Creatinine, Ser: 1.26 mg/dL — ABNORMAL HIGH (ref 0.61–1.24)
GFR, Estimated: >60 mL/min (ref >60)
Glucose, Bld: 114 mg/dL — ABNORMAL HIGH (ref 70–99)
Potassium: 3.8 mmol/L (ref 3.5–5.1)
Sodium: 136 mmol/L (ref 135–145)

## 2023-04-10 LAB — HEPARIN LEVEL (UNFRACTIONATED)
Heparin Unfractionated: 0.47 [IU]/mL (ref 0.30–0.70)
Heparin Unfractionated: 0.5 [IU]/mL (ref 0.30–0.70)

## 2023-04-10 LAB — GLUCOSE, CAPILLARY
Glucose-Capillary: 278 mg/dL — ABNORMAL HIGH (ref 70–99)
Glucose-Capillary: 237 mg/dL — ABNORMAL HIGH (ref 70–99)
Glucose-Capillary: 190 mg/dL — ABNORMAL HIGH (ref 70–99)
Glucose-Capillary: 247 mg/dL — ABNORMAL HIGH (ref 70–99)

## 2023-04-10 MED ORDER — ALBUTEROL SULFATE (2.5 MG/3ML) 0.083% IN NEBU
2.5000 mg | INHALATION_SOLUTION | Freq: Four times a day (QID) | RESPIRATORY_TRACT | Status: DC | PRN
Start: 2023-04-10 — End: 2023-04-12

## 2023-04-10 MED ORDER — WARFARIN - PHARMACIST DOSING INPATIENT: Freq: Every day | Status: DC

## 2023-04-10 MED ORDER — FUROSEMIDE 10 MG/ML IJ SOLN
40.0000 mg | Freq: Every day | INTRAMUSCULAR | Status: AC
  Administered 2023-04-10: 40 mg via INTRAVENOUS

## 2023-04-10 MED ORDER — LOSARTAN POTASSIUM 25 MG PO TABS
12.5000 mg | ORAL_TABLET | Freq: Every day | ORAL | Status: DC
  Administered 2023-04-10 – 2023-04-12 (×3): 12.5 mg via ORAL
  Filled 2023-04-10 (×3): qty 1

## 2023-04-10 MED ORDER — WARFARIN SODIUM 5 MG PO TABS
5.0000 mg | ORAL_TABLET | Freq: Once | ORAL | Status: DC
  Filled 2023-04-10: qty 1

## 2023-04-10 MED ORDER — DIGOXIN 125 MCG PO TABS
0.1250 mg | ORAL_TABLET | Freq: Every day | ORAL | Status: DC
  Administered 2023-04-10 – 2023-04-12 (×3): 0.125 mg via ORAL
  Filled 2023-04-10 (×3): qty 1

## 2023-04-10 MED ORDER — BUSPIRONE HCL 10 MG PO TABS
5.0000 mg | ORAL_TABLET | Freq: Two times a day (BID) | ORAL | Status: DC
  Administered 2023-04-10 – 2023-04-12 (×5): 5 mg via ORAL
  Filled 2023-04-10 (×5): qty 1

## 2023-04-10 MED ORDER — MAGNESIUM SULFATE 2 GM/50ML IV SOLN
2.0000 g | Freq: Once | INTRAVENOUS | Status: AC
  Administered 2023-04-10: 2 g via INTRAVENOUS
  Filled 2023-04-10: qty 50

## 2023-04-10 MED ORDER — MOMETASONE FURO-FORMOTEROL FUM 200-5 MCG/ACT IN AERO
2.0000 | INHALATION_SPRAY | Freq: Two times a day (BID) | RESPIRATORY_TRACT | Status: DC
  Administered 2023-04-10 – 2023-04-12 (×5): 2 via RESPIRATORY_TRACT
  Filled 2023-04-10: qty 8.8

## 2023-04-10 MED ORDER — FUROSEMIDE 10 MG/ML IJ SOLN: 40.0000 mg | Freq: Every day | INTRAMUSCULAR | Status: DC

## 2023-04-10 NOTE — Plan of Care (Signed)

## 2023-04-10 NOTE — Progress Notes (Signed)
 PHARMACY - ANTICOAGULATION CONSULT NOTE  Pharmacy Consult for heparin infusion Indication: Large LV thrombus   No Known Allergies  Patient Measurements: Height: 5\' 9"  (175.3 cm) Weight: 76.2 kg (168 lb) IBW/kg (Calculated) : 70.7 Heparin Dosing Weight: 76.2 kg  Vital Signs: Temp: 98.2 F (36.8 C) (03/19 0038) Temp Source: Oral (03/19 0038) BP: 98/85 (03/19 0038) Pulse Rate: 82 (03/19 0038)  Labs: Recent Labs    04/09/23 0121 04/09/23 0307 04/09/23 1924 04/10/23 0159  HGB 15.4  --   --   --   HCT 46.5  --   --   --   PLT 215  --   --   --   APTT  --   --  32  --   LABPROT  --   --  14.7  --   INR  --   --  1.1  --   HEPARINUNFRC  --   --   --  0.47  CREATININE 1.17  --   --  1.26*  TROPONINIHS 22* 21*  --   --     Estimated Creatinine Clearance: 63.9 mL/min (A) (by C-G formula based on SCr of 1.26 mg/dL (H)).   Medical History: Past Medical History:  Diagnosis Date   Coronary artery disease    Diabetes mellitus without complication (HCC)    GERD (gastroesophageal reflux disease)    Hypertension    Stroke (cerebrum) (HCC)     Medications:  Scheduled:   aspirin EC  81 mg Oral Daily   atorvastatin  80 mg Oral q1800   buPROPion ER  100 mg Oral BID   furosemide  40 mg Intravenous Daily   insulin aspart  0-5 Units Subcutaneous QHS   insulin aspart  0-9 Units Subcutaneous TID WC   isosorbide mononitrate  30 mg Oral Daily   lisinopril  40 mg Oral Daily   melatonin  2.5 mg Oral QHS   metoprolol succinate  12.5 mg Oral Daily   nicotine  21 mg Transdermal Daily   pantoprazole  40 mg Oral Daily   spironolactone  12.5 mg Oral Daily    Assessment: 58 y.o. male with CAD s/p PCI in 08/2022, hypertension, GERD, type 2 diabetes, hyperlipidemia, and tobacco use who presented with worsening shortness of breath over several months. Large LV thrombus noted on ECHO. No chronic anticoagulation noted based on dispense record  Baseline Labs: H&H/PLT wnl, INR/aPTT  pending  Goal of Therapy:  Heparin level 0.3-0.7 units/ml Monitor platelets by anticoagulation protocol: Yes  3/19 0159 HL 0.47, therapeutic x 1   Plan:  Continue heparin infusion at 1350 units/hr Recheck HL in 6 hrs to confirm, then daily. CBC daily while on heparin  Otelia Sergeant, PharmD, [facility] 04/10/2023 3:02 AM

## 2023-04-10 NOTE — Progress Notes (Addendum)
 [facility] CLINIC CARDIOLOGY PROGRESS NOTE       Patient ID: Kenneth [name] MRN: [id number] DOB/AGE: 03/15/1965 58 y.o.  Admit date: 04/09/2023 Referring Physician Kenneth [name] Primary Physician Sparks, Kenneth Lope, MD  Primary Cardiologist Kenneth [name] Reason for Consultation AoCHF, elevated troponins  HPI: Kenneth [name] is a 58 y.o. male  with a past medical history of CAD s/p remote PCI to RCA and PCI to OM1 in 2024, chronic HFrEF uncontrolled DM2, HLD, HTN, hx CVA, ongoing tobacco use who presented to the ED on 04/09/2023 for chest pain, shortness of breath. Cardiology was consulted for further evaluation.   Interval history: -Patient seen and examined this morning with wife present at bedside. -Reports that he is feeling better today, feels less short of breath.  Endorses cough. -Denies any chest pain symptoms.  BP and heart rate remain controlled. -Echo results discussed with patient and wife at bedside.  Explained significantly weak heart function and resultant LV apical thrombus. 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  Review of systems complete and found to be negative unless listed above    Past Medical History:  Diagnosis Date   Coronary artery disease    Diabetes mellitus without complication (HCC)    GERD (gastroesophageal reflux disease)    Hypertension    Stroke (cerebrum) (HCC)     Past Surgical History:  Procedure Laterality Date   CORONARY ANGIOPLASTY WITH STENT PLACEMENT  06/18/2008   CORONARY ANGIOPLASTY WITH STENT PLACEMENT     CORONARY STENT INTERVENTION N/A 09/04/2022   Procedure: CORONARY STENT INTERVENTION;  Surgeon: Alwyn Pea, MD;  Location: [facility] INVASIVE CV LAB;  Service: Cardiovascular;  Laterality: N/A;   LEFT HEART CATH AND CORONARY ANGIOGRAPHY N/A 09/04/2022   Procedure: LEFT HEART CATH AND CORONARY ANGIOGRAPHY;  Surgeon: Alwyn Pea, MD;  Location: [facility]  INVASIVE CV LAB;  Service: Cardiovascular;  Laterality: N/A;   SHOULDER ARTHROSCOPY WITH ROTATOR CUFF REPAIR AND SUBACROMIAL DECOMPRESSION Right 04/19/2017   Procedure: SHOULDER ARTHROSCOPY WITH ROTATOR CUFF REPAIR AND SUBACROMIAL DECOMPRESSION;  Surgeon: Signa Kell, MD;  Location: [facility] ORS;  Service: Orthopedics;  Laterality: Right;  distal clavical excision, bicepts tenotomy    Medications Prior to Admission  Medication Sig Dispense Refill Last Dose/Taking   albuterol (PROVENTIL) (2.5 MG/3ML) 0.083% nebulizer solution Take 2.5 mg by nebulization every 6 (six) hours as needed for wheezing.   Taking As Needed   albuterol (VENTOLIN HFA) 108 (90 Base) MCG/ACT inhaler Inhale 2 puffs into the lungs every 4 (four) hours as needed for wheezing.   Taking As Needed   budesonide-formoterol (SYMBICORT) 160-4.5 MCG/ACT inhaler Inhale 2 puffs into the lungs 2 (two) times daily.   Past Week   busPIRone (BUSPAR) 5 MG tablet Take 1 tablet by mouth 2 (two) times daily.   04/08/2023   ipratropium-albuterol (DUONEB) 0.5-2.5 (3) MG/3ML SOLN Take 3 mLs by nebulization every 4 (four) hours as needed. 360 mL 0 Taking As Needed   nystatin (MYCOSTATIN) 100000 UNIT/ML suspension Take 5 mLs by mouth 3 (three) times daily. X 10 days   04/08/2023   atorvastatin (LIPITOR) 80 MG tablet Take 1 tablet (80 mg total) by mouth daily at 6 PM. 90 tablet 0    buPROPion (WELLBUTRIN SR) 100 MG 12 hr tablet Take 100 mg by mouth 2 (two) times daily.       glimepiride (AMARYL) 4 MG tablet Take 1  tablet (4 mg total) by mouth daily with breakfast. (Patient not taking: Reported on 04/09/2023) 30 tablet 0 Not Taking   lisinopril (ZESTRIL) 40 MG tablet Take 1 tablet (40 mg total) by mouth daily. 90 tablet 0    metFORMIN (GLUCOPHAGE) 1000 MG tablet TAKE 1 TABLET BY MOUTH TWICE DAILY WITH MEALS (Patient not taking: Reported on 04/09/2023) 180 tablet 0 Not Taking   metoprolol succinate (TOPROL-XL) 25 MG 24 hr tablet Take 0.5 tablets (12.5 mg total) by  mouth daily. 45 tablet 0    nicotine (NICODERM CQ - DOSED IN MG/24 HOURS) 21 mg/24hr patch Place 1 patch (21 mg total) onto the skin daily. (Patient not taking: Reported on 04/09/2023) 28 patch 0 Not Taking   omeprazole (PRILOSEC) 20 MG capsule Take 20 mg by mouth daily.      predniSONE (STERAPRED UNI-PAK 21 TAB) 10 MG (21) TBPK tablet As directed on packaging (Patient not taking: Reported on 04/09/2023) 1 each 0 Not Taking   Spacer/Aero-Holding Chambers (AEROCHAMBER MV) inhaler Use as instructed 1 each 0    spironolactone (ALDACTONE) 25 MG tablet Take 0.5 tablets (12.5 mg total) by mouth daily. 45 tablet 0    Social History   Socioeconomic History   Marital status: Married    Spouse name: Not on file   Number of children: Not on file   Years of education: Not on file   Highest education level: Not on file  Occupational History   Not on file  Tobacco Use   Smoking status: Heavy Smoker    Current packs/day: 1.50    Types: Cigarettes   Smokeless tobacco: Current    Types: Chew   Tobacco comments:    on his own terms  Vaping Use   Vaping status: Never Used  Substance and Sexual Activity   Alcohol use: No    Alcohol/week: 0.0 standard drinks of alcohol    Comment: OCCASIONALLY   Drug use: No   Sexual activity: Not on file  Other Topics Concern   Not on file  Social History Narrative   Not on file   Social Drivers of Health   Financial Resource Strain: Low Risk  (02/18/2023)   Received from [facility]   Overall Financial Resource Strain (CARDIA)    Difficulty of Paying Living Expenses: Not hard at all  Food Insecurity: No Food Insecurity (04/09/2023)   Hunger Vital Sign    Worried About Running Out of Food in the Last Year: Never true    Ran Out of Food in the Last Year: Never true  Transportation Needs: No Transportation Needs (04/09/2023)   PRAPARE - Administrator, Civil Service (Medical): No    Lack of Transportation (Non-Medical): No   Physical Activity: Not on file  Stress: Not on file  Social Connections: Unknown (04/09/2023)   Social Connection and Isolation Panel [NHANES]    Frequency of Communication with Friends and Family: Never    Frequency of Social Gatherings with Friends and Family: Not on file    Attends Religious Services: Never    Database administrator or Organizations: No    Attends Banker Meetings: Never    Marital Status: Married  Catering manager Violence: Not At Risk (04/09/2023)   Humiliation, Afraid, Rape, and Kick questionnaire    Fear of Current or Ex-Partner: No    Emotionally Abused: No    Physically Abused: No    Sexually Abused: No    Family History  Problem Relation Age of Onset   Heart attack Mother    Heart failure Mother    Coronary artery disease Father    Diabetes Father      Vitals:   04/09/23 2037 04/10/23 0038 04/10/23 0333 04/10/23 0843  BP: 109/81 98/85 101/83 113/80  Pulse: 93 82 95 95  Resp: 19 18 18 16   Temp: 98.1 F (36.7 C) 98.2 F (36.8 C) 97.6 F (36.4 C) 98.5 F (36.9 C)  TempSrc: Oral Oral Oral Oral  SpO2: 100% 95% 99% 96%  Weight:      Height:        PHYSICAL EXAM General: Chronically ill appearing male, well nourished, in no acute distress. HEENT: Normocephalic and atraumatic. Neck: No JVD.  Lungs: Normal respiratory effort on room air. Clear bilaterally to auscultation. No wheezes, crackles, rhonchi.  Heart: HRRR. Normal S1 and S2 without gallops or murmurs.  Abdomen: Non-distended appearing.  Msk: Normal strength and tone for age. Extremities: Warm and well perfused. No clubbing, cyanosis. No edema.  Neuro: Alert and oriented X 3. Psych: Answers questions appropriately.   Labs: Basic Metabolic Panel: Recent Labs    04/09/23 0121 04/10/23 0159  NA 133* 136  K 4.5 3.8  CL 102 102  CO2 21* 26  GLUCOSE 349* 114*  BUN 23* 25*  CREATININE 1.17 1.26*  CALCIUM 8.8* 8.7*   Liver Function Tests: Recent Labs     04/09/23 0121  AST 29  ALT 40  ALKPHOS 90  BILITOT 1.9*  PROT 6.0*  ALBUMIN 3.0*   No results for input(s): "LIPASE", "AMYLASE" in the last 72 hours. CBC: Recent Labs    04/09/23 0121 04/10/23 0812  WBC 9.7 11.4*  NEUTROABS 7.3  --   HGB 15.4 15.0  HCT 46.5 43.1  MCV 88.2 85.3  PLT 215 212   Cardiac Enzymes: Recent Labs    04/09/23 0121 04/09/23 0307  TROPONINIHS 22* 21*   BNP: Recent Labs    04/09/23 0121  BNP 1,185.6*   D-Dimer: No results for input(s): "DDIMER" in the last 72 hours. Hemoglobin A1C: Recent Labs    04/09/23 0741  HGBA1C 12.0*   Fasting Lipid Panel: No results for input(s): "CHOL", "HDL", "LDLCALC", "TRIG", "CHOLHDL", "LDLDIRECT" in the last 72 hours. Thyroid Function Tests: No results for input(s): "TSH", "T4TOTAL", "T3FREE", "THYROIDAB" in the last 72 hours.  Invalid input(s): "FREET3" Anemia Panel: No results for input(s): "VITAMINB12", "FOLATE", "FERRITIN", "TIBC", "IRON", "RETICCTPCT" in the last 72 hours.   Radiology: ECHOCARDIOGRAM COMPLETE Result Date: 04/09/2023    ECHOCARDIOGRAM REPORT   Patient Name:   Kenneth [name] Date of Exam: 04/09/2023 Medical Rec #:  [id number]    Height:       69.0 in Accession #:    [id number]   Weight:       168.0 lb Date of Birth:  01-04-1966    BSA:          1.918 m Patient Age:    57 years     BP:           100/88 mmHg Patient Gender: M            HR:           100 bpm. Exam Location:  [facility] Procedure: 2D Echo, Cardiac Doppler, Color Doppler, Strain Analysis and 3D Echo            (Both Spectral and Color Flow Doppler were utilized during  procedure). Indications:     CHF-acute systolic I50.21  History:         Patient has prior history of Echocardiogram examinations, most                  recent 09/04/2022. CAD, Stroke; Risk Factors:Diabetes and                  Hypertension.  Sonographer:     Cristela Blue Referring Phys:  [id number] VIPUL [facility] Diagnosing Phys: Windell Norfolk  Sonographer Comments: Global  longitudinal strain was attempted. IMPRESSIONS  1. Large LV thrombus noted. Left ventricular ejection fraction, by estimation, is 10-15%%. The left ventricle has severely decreased function. The left ventricle demonstrates regional wall motion abnormalities (see scoring diagram/findings for description). The left ventricular internal cavity size was severely dilated. Left ventricular diastolic parameters are consistent with Grade II diastolic dysfunction (pseudonormalization).  2. Right ventricular systolic function is mildly reduced. The right ventricular size is mildly enlarged. There is moderately elevated pulmonary artery systolic pressure. The estimated right ventricular systolic pressure is 45.4 mmHg.  3. Left atrial size was mildly dilated.  4. The mitral valve is normal in structure. Mild mitral valve regurgitation.  5. The aortic valve is tricuspid. There is mild thickening of the aortic valve. Aortic valve regurgitation is trivial. FINDINGS  Left Ventricle: Large LV thrombus noted. Left ventricular ejection fraction, by estimation, is 10-15%%. The left ventricle has severely decreased function. The left ventricle demonstrates regional wall motion abnormalities. The left ventricular internal  cavity size was severely dilated. There is no left ventricular hypertrophy. Left ventricular diastolic parameters are consistent with Grade II diastolic dysfunction (pseudonormalization).  LV Wall Scoring: The mid and distal anterior septum, entire apex, entire inferior wall, and mid inferoseptal segment are akinetic. The anterior wall, antero-lateral wall, posterior wall, basal anteroseptal segment, and basal inferoseptal segment are hypokinetic. Right Ventricle: The right ventricular size is mildly enlarged. No increase in right ventricular wall thickness. Right ventricular systolic function is mildly reduced. There is moderately elevated pulmonary artery systolic pressure. The tricuspid regurgitant velocity is 3.18  m/s, and with an assumed right atrial pressure of 5 mmHg, the estimated right ventricular systolic pressure is 45.4 mmHg. Left Atrium: Left atrial size was mildly dilated. Right Atrium: Right atrial size was normal in size. Pericardium: There is no evidence of pericardial effusion. Mitral Valve: The mitral valve is normal in structure. Mild mitral valve regurgitation. Tricuspid Valve: The tricuspid valve is normal in structure. Tricuspid valve regurgitation is mild. Aortic Valve: The aortic valve is tricuspid. There is mild thickening of the aortic valve. Aortic valve regurgitation is trivial. Aortic valve mean gradient measures 1.0 mmHg. Aortic valve peak gradient measures 1.5 mmHg. Aortic valve area, by VTI measures 3.71 cm. Pulmonic Valve: The pulmonic valve was not well visualized. Pulmonic valve regurgitation is trivial. Aorta: The aortic root is normal in size and structure. IAS/Shunts: The interatrial septum was not well visualized.  LEFT VENTRICLE PLAX 2D LVIDd:         6.70 cm      Diastology LVIDs:         6.50 cm      LV e' medial:    4.68 cm/s LV PW:         1.10 cm      LV E/e' medial:  15.3 LV IVS:        0.80 cm      LV e' lateral:   4.57 cm/s LVOT diam:  2.20 cm      LV E/e' lateral: 15.7 LV SV:         26 LV SV Index:   14 LVOT Area:     3.80 cm  LV Volumes (MOD) LV vol d, MOD A2C: 202.0 ml LV vol d, MOD A4C: 279.0 ml LV vol s, MOD A2C: 189.0 ml LV vol s, MOD A4C: 259.0 ml LV SV MOD A2C:     13.0 ml LV SV MOD A4C:     279.0 ml LV SV MOD BP:      12.8 ml RIGHT VENTRICLE RV Basal diam:  4.60 cm RV Mid diam:    3.20 cm RV S prime:     8.16 cm/s TAPSE (M-mode): 1.3 cm LEFT ATRIUM             Index        RIGHT ATRIUM           Index LA diam:        4.40 cm 2.29 cm/m   RA Area:     16.30 cm LA Vol (A2C):   62.5 ml 32.58 ml/m  RA Volume:   45.60 ml  23.77 ml/m LA Vol (A4C):   36.3 ml 18.92 ml/m LA Biplane Vol: 49.7 ml 25.91 ml/m  AORTIC VALVE AV Area (Vmax):    3.38 cm AV Area (Vmean):   3.23  cm AV Area (VTI):     3.71 cm AV Vmax:           61.00 cm/s AV Vmean:          39.800 cm/s AV VTI:            0.071 m AV Peak Grad:      1.5 mmHg AV Mean Grad:      1.0 mmHg LVOT Vmax:         54.30 cm/s LVOT Vmean:        33.800 cm/s LVOT VTI:          0.070 m LVOT/AV VTI ratio: 0.98  AORTA Ao Root diam: 3.30 cm MITRAL VALVE               TRICUSPID VALVE MV Area (PHT): 6.83 cm    TR Peak grad:   40.4 mmHg MV Decel Time: 111 msec    TR Vmax:        318.00 cm/s MV E velocity: 71.80 cm/s MV A velocity: 43.50 cm/s  SHUNTS MV E/A ratio:  1.65        Systemic VTI:  0.07 m                            Systemic Diam: 2.20 cm Windell Norfolk Electronically signed by Windell Norfolk Signature Date/Time: 04/09/2023/5:46:27 PM    Final    DG Chest Port 1 View Result Date: 04/09/2023 CLINICAL DATA:  Chest pain EXAM: PORTABLE CHEST 1 VIEW COMPARISON:  03/25/2023 FINDINGS: Cardiomegaly. No confluent airspace opacities, effusions or edema. No acute bony abnormality. IMPRESSION: Cardiomegaly.  No active disease. Electronically Signed   By: Charlett Nose M.D.   On: 04/09/2023 01:36   DG Chest 2 View Result Date: 03/25/2023 CLINICAL DATA:  Shortness of breath.  Weakness. EXAM: CHEST - 2 VIEW COMPARISON:  09/03/2022 FINDINGS: The heart size and mediastinal contours are within normal limits. Both lungs are clear. The visualized skeletal structures are unremarkable. IMPRESSION: No active cardiopulmonary disease. Electronically Signed   By: Mayra Neer  Eppie Gibson M.D.   On: 03/25/2023 13:28    ECHO as above  TELEMETRY reviewed by me 04/10/2023: sinus rhythm LBBB PVCs rate 100s  EKG reviewed by me: Sinus rhythm LBBB rate 96 bpm  Data reviewed by me 04/10/2023: last 24h vitals tele labs imaging I/O hospitalist progress note, heart failure pharmacy notes  Principal Problem:   Precordial chest pain    ASSESSMENT AND PLAN:  Kenneth [name] is a 58 y.o. male  with a past medical history of CAD s/p remote PCI to RCA and PCI to OM1 in 2024,  chronic HFrEF uncontrolled DM2, HLD, HTN, hx CVA, ongoing tobacco use who presented to the ED on 04/09/2023 for chest pain, shortness of breath. Cardiology was consulted for further evaluation.   # Acute on chronic HFrEF # LV apical thrombus # Coronary artery disease # Chronic LBBB Longstanding history of cardiomyopathy dating back to 2017 (limited echo from 02/2017 showed EF 45-50%, from 12/2015 EF was 30-35% with mild LV dilation), previously on GDMT but history of noncompliance.  Echo last admission with EF down to 20-25%.  BNP this admission 1100, JVD on exam.  Repeat echo with EF 10-15%, severely reduced LV function, mildly reduced RV function with moderately elevated PASP.  Large LV thrombus noted. -Started on heparin last night, will transition to warfarin today. -Continue IV lasix 40 mg daily.  Likely transition to p.o. tomorrow. -Continue atorvastatin 80 mg daily and aspirin 81 mg daily. -GDMT with metoprolol XL 12.5 mg daily, spironolactone 12.5 mg daily.  Will switch lisinopril to losartan 12.5 mg today.  Given significantly elevated A1c will defer SGLT2 at this time. -Minimally elevated and flat trending troponin most consistent with demand/supply mismatch and not ACS  -Will need outpatient heart failure follow-up -patient was severely reduced EF and now subsequent LV apical thrombus.  Will need evaluation outpatient for advanced therapies. -Stressed the importance of medication compliance.  # Tobacco abuse Strongly encouraged complete cessation, nicotine patches.  Patient reported today that if he is not discharged that he will be leaving regardless as he needs to work.  Patient aware of severity of LV dysfunction and associated risks.  This patient's plan of care was discussed and created with Dr. Darrold Junker and he is in agreement.  Signed: Gale Journey, PA-C  04/10/2023, 9:08 AM [facility]

## 2023-04-10 NOTE — Inpatient Diabetes Management (Signed)
 Inpatient Diabetes Program Recommendations  AACE/ADA: New Consensus Statement on Inpatient Glycemic Control  Target Ranges:  Prepandial:   less than 140 mg/dL      Peak postprandial:   less than 180 mg/dL (1-2 hours)      Critically ill patients:  140 - 180 mg/dL    Latest Reference Range & Units 04/09/23 07:38 04/09/23 11:28 04/09/23 17:11 04/09/23 23:23 04/10/23 08:44  Glucose-Capillary 70 - 99 mg/dL 80 016 (H) 010 (H) 932 (H) 278 (H)    Latest Reference Range & Units 04/09/23 07:41  Hemoglobin A1C 4.8 - 5.6 % 12.0 (H)   Review of Glycemic Control  Diabetes history: DM2 Outpatient Diabetes medications: Metformin 1000 mg BID (recently resumed taking 1-2 weeks ago), Amaryl 4 mg daily (not taking) Current orders for Inpatient glycemic control: Novolog 0-9 units TID with meals, Novolog 0-5 units QHS  Inpatient Diabetes Program Recommendations:    HbgA1C:  A1C 12.0% on 04/09/23 indicating an average glucose of 298 mg/dl over the past 2-3 months.  Outpatient DM: At time of discharge, please provide Rx for Amaryl 4 mg daily (patient reports that he does not have any of the medication at home if he is suppose to be taking it).   NOTE: Spoke with patient and wife at bedside about diabetes and home regimen for diabetes control. Patient states that his wife gives him his medications. Patient's wife states that patient had not been taking anything for DM but recently (in past week or two) she has been trying to be sure he is taking Metformin 1000 mg BID. Patient states he is busy and stressed with work and he frequently forgets about taking the medication but his wife has started trying to put the medication beside his breakfast in the mornings to help him remember to take them. Inquired about Amaryl and patient and his wife state patient does not have an Amaryl at home (PCP did not refill if he is suppose to be taking it).  Patient confirms that he does not have any insurance but he does have a PCP  and recently seen PCP 04/03/23. Patient does not know where his glucometer and supplies are and will need to purchase a new one. Discussed Reli-On Prime glucometer ($9 at [facility]) and Reli-On box of 50 test strips for ($9 at [facility]).   Discussed A1C results (12% on 04/09/23) and explained that current A1C indicates an average glucose of 298 mg/dl over the past 2-3 months. 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 Discussed that patient taking insulin can have a CDL but they have to report more information regarding DM and control.  Patient does not want to use insulin outpatient. Discussed glucose and A1C goals. Discussed importance of checking CBGs and maintaining good CBG control to prevent long-term and short-term complications. Explained how hyperglycemia leads to damage within blood vessels which lead to the common complications seen with uncontrolled diabetes. Stressed to the patient the importance of improving glycemic control to prevent further complications from uncontrolled diabetes. Discussed impact of nutrition, exercise, stress, sickness, and medications on diabetes control.  Patient's wife states patient has a sweet tooth and eats high carbs snacks in the evening.  Encouraged patient to cut back on high carb snacks in the evening. Explained that he will need to take more than Metformin to get DM controlled; encouraged patient to take DM medications as prescribed,  to find a way to make a habit of taking medications as prescribed, and follow up with PCP regarding DM.   Patient verbalized understanding of information discussed and reports no further questions at this time related to diabetes.  Thanks, Orlando Penner, RN, MSN, CDE Diabetes Coordinator Inpatient Diabetes Program [phone] (Team Pager)

## 2023-04-10 NOTE — Consult Note (Signed)
 Advanced Heart Failure Team Consult Note   Primary Physician: Marguarite Arbour, MD PCP-Cardiologist: Dr. Melton Alar  Reason for Consultation: Chronic systolic heart failure  HPI:    Kenneth [name] is a 58 y.o. male with a PMH of CAD with PCI to the RCA and OM, chronic heart failure with reduced ejection fraction, diabetes, hyperlipidemia, hypertension, prior CVA, tobacco use who is seen today for evaluation of acute on chronic systolic heart failure at the request of Dr. Darrold Junker.   Patient is a pleasant 58 year old gentleman who presented to the emergency department on 3/18 for chest pain and shortness of breath.  He reports that really since December he has had significant worsening in his functional status, to the point where he is short of breath walking more than a few feet.  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  He reports that about a year ago he was fairly functional.  The last few months he has significantly cut down on his smoking to where a pack will now last him about a week.  Last cigarette was Sunday.  He feels mildly improved since being here and walked around the unit earlier.  But is short of breath with minimal conversation.  Has 6 grandchildren and would like to be around for them.  He runs a [employer] and is very busy with them.      Objective:    Vital Signs:   Temp:  [97.6 F (36.4 C)-98.5 F (36.9 C)] 98.1 F (36.7 C) (03/19 1343) Pulse Rate:  [82-95] 95 (03/19 0843) Resp:  [16-29] 16 (03/19 1343) BP: (85-131)/(71-97) 85/71 (03/19 1343) SpO2:  [95 %-100 %] 95 % (03/19 1343) Last BM Date : 04/08/23  Weight change: Filed Weights   04/09/23 0110  Weight: 76.2 kg    Intake/Output:   Intake/Output Summary (Last 24 hours) at 04/10/2023 1554 Last data filed at 04/10/2023 1417 Gross per 24 hour  Intake 134.69 ml  Output 1800 ml  Net -1665.31 ml      Physical Exam    General: Chronically  ill-appearing HEENT: NCAT Cor:  Regular rate & rhythm.  Systolic murmur, JVP mildly elevated, no lower extremity edema Lungs: Normal work of breathing, clear to auscultation bilaterally Abdomen: soft, nontender, nondistended Extremities: no cyanosis, clubbing, rash Neuro: alert & orientedx3, cranial nerves grossly intact. moves all 4 extremities w/o difficulty. Affect pleasant   Telemetry   Normal sinus  Labs and medications reviewed in Epic.  Patient Profile   Kenneth [name] is a 58 y.o. male with a PMH of CAD with PCI to the RCA and OM, chronic heart failure with reduced ejection fraction, diabetes, hyperlipidemia, hypertension, prior CVA, tobacco use who is seen today for evaluation of acute on chronic systolic heart failure at the request of Dr. Darrold Junker.   Assessment/Plan   Acute on chronic systolic heart failure: Echocardiogram reviewed, large LV thrombus and severely dilated LV, 6.7 LVIDD.  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  Given recent smoking history would not be currently eligible for a transplant and based on his appearance I am concerned about the 6 months he would need to wait.  RV function appears fairly well-preserved so LVAD would be a good alternative option. -Right heart catheterization to determine filling pressures and cardiac output, likely to be low -Long discussion about the potential for urgent LVAD referral -Continue losartan 12.5 mg daily,  metoprolol 12.5 mg daily for now -Digoxin 0.125 mg daily -Potential barriers to advanced therapies include diabetes, tobacco use, and potential COPD -Continue spironolactone 12.5 mg -No SGLT2 with diabetes  CAD: Known disease, though LAD appears largely open on last catetherization.  Agree that chest pain is likely due to decompensated heart failure. 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 -Continue  anticoagulation  Diabetes: Last A1c 12. May limit advanced therapy options.  - Management per primary   Length of Stay: 0  Romie Minus, MD  04/10/2023, 3:54 PM  Advanced Heart Failure Team Pager [phone] (M-F; 7a - 5p)  Please contact [facility] for night-coverage after hours (4p -7a ) and weekends on [email or web address]

## 2023-04-10 NOTE — Progress Notes (Addendum)
 PHARMACY - ANTICOAGULATION CONSULT NOTE  Pharmacy Consult for heparin infusion and warfarin Indication: Large LV thrombus   No Known Allergies  Patient Measurements: Height: 5\' 9"  (175.3 cm) Weight: 76.2 kg (168 lb) IBW/kg (Calculated) : 70.7 Heparin Dosing Weight: 76.2 kg  Vital Signs: Temp: 98.5 F (36.9 C) (03/19 0843) Temp Source: Oral (03/19 0843) BP: 113/80 (03/19 0843) Pulse Rate: 95 (03/19 0843)  Labs: Recent Labs    04/09/23 0121 04/09/23 0307 04/09/23 1924 04/10/23 0159 04/10/23 0812  HGB 15.4  --   --   --  15.0  HCT 46.5  --   --   --  43.1  PLT 215  --   --   --  212  APTT  --   --  32  --   --   LABPROT  --   --  14.7  --   --   INR  --   --  1.1  --   --   HEPARINUNFRC  --   --   --  0.47 0.50  CREATININE 1.17  --   --  1.26*  --   TROPONINIHS 22* 21*  --   --   --    Estimated Creatinine Clearance: 63.9 mL/min (A) (by C-G formula based on SCr of 1.26 mg/dL (H)).  Medical History: Past Medical History:  Diagnosis Date   Coronary artery disease    Diabetes mellitus without complication (HCC)    GERD (gastroesophageal reflux disease)    Hypertension    Stroke (cerebrum) (HCC)     Medications:  Scheduled:   aspirin EC  81 mg Oral Daily   atorvastatin  80 mg Oral q1800   furosemide  40 mg Intravenous Daily   insulin aspart  0-5 Units Subcutaneous QHS   insulin aspart  0-9 Units Subcutaneous TID WC   isosorbide mononitrate  30 mg Oral Daily   losartan  12.5 mg Oral Daily   melatonin  2.5 mg Oral QHS   metoprolol succinate  12.5 mg Oral Daily   nicotine  21 mg Transdermal Daily   pantoprazole  40 mg Oral Daily   spironolactone  12.5 mg Oral Daily   warfarin  5 mg Oral ONCE-1600   Warfarin - Pharmacist Dosing Inpatient   Does not apply q1600   Assessment: 58 y.o. male with CAD s/p PCI in 08/2022, hypertension, GERD, type 2 diabetes, hyperlipidemia, and tobacco use who presented with worsening shortness of breath over several months. Large LV  thrombus noted on ECHO. No chronic anticoagulation noted based on dispense record  Baseline Labs: H&H/PLT wnl, INR/aPTT   Results: Date Time HL INR Comments  3/13 0159 HL=0.47  Heparin therapeutic x 1, INR 1.1 at baseline  3/13 0812 HL=0.50 1.1 Heparin therapeutic x 2               Goal of Therapy:  Heparin level 0.3-0.7 units/ml Monitor platelets by anticoagulation protocol: Yes   Warfarin for LV thrombus: Goal INR 2-3  Plan:  Patient currently on heparin infusion and new consult to initiate warfarin received from cardiology. Patient uninsured and not good candidate for DOACper provider assessment. [facility] setting up patient with outpatient coumadin clinic, pharmacy was consulted to manage warfarin while inpatient. Will bridge until INR therapeutic. Continue heparin infusion at 1350 units/hr Plan to start warfarin after cardiac cath on 3/20 Will order HL and INR with AM labs Continue to monitor CBC daily while on heparin infusion.  Ayce Pietrzyk Rodriguez-Guzman PharmD,  BCPS 04/10/2023 9:05 AM

## 2023-04-10 NOTE — Progress Notes (Signed)
 PROGRESS NOTE    Kenneth [name]   ZOX:[id number] DOB: [birth year]  DOA: 04/09/2023 Date of Service: 04/10/23 which is hospital day 0  PCP: Marguarite Arbour, MD    Hospital course / significant events:   HPI: Kenneth [name] is a 58 y.o. male  with a past medical history of CAD s/p remote PCI to RCA and PCI to OM1 in 2024, chronic HFrEF uncontrolled DM2, HLD, HTN, hx CVA, ongoing tobacco use who presented to the ED on 04/09/2023 for chest pain, shortness of breath.   03/18: admitted to hospitalist service. EF 10-15%, severely reduced LV function, mildly reduced RV function with moderately elevated PASP.  Large LV thrombus noted. Started heparin gtt.  03/19: Cardiology saw pt this morning. Echo results reviewed w/ patient and family. Explained significantly weak heart function and resultant LV apical thrombus. Transition to warfarin. Plan transition to po diuresis. Pt was wanting to elave AMA but agreeable to stay. Cardiology planning for cardiac cath tomorrow, will hold warfarin for now  Consultants:  Cardiology   Procedures/Surgeries: none      ASSESSMENT & PLAN:    # Acute on chronic HFrEF, severely reduced EF # Coronary artery disease # Chronic LBBB aspirin 81 mg daily. atorvastatin 80 mg daily  Digoxin 0.125 mg daily Furosemide per cardiology  Isosorbide mononitrate 30 mg daily  losartan 12.5 mg daily.  metoprolol XL 12.5 mg daily spironolactone 12.5 mg daily significantly elevated A1c will defer SGLT2  R Cardiac cath planned tomorrow   # LV apical thrombus # Subtherapeutic INR Warfarin held for now planning cath tm Heparin gtt   # Elevated troponin, have reasonably r/o ACS Minimally elevated and flat trending troponin most consistent with demand/supply mismatch and not ACS    # Tobacco abuse Strongly encouraged complete cessation, nicotine patches.      No concerns based on BMI: Body mass index is 24.81 kg/m.  Underweight - under 18  overweight - 25 to  29 obese - 30 or more Class 1 obesity: BMI of 30.0 to 34 Class 2 obesity: BMI of 35.0 to 39 Class 3 obesity: BMI of 40.0 to 49 Super Morbid Obesity: BMI 50-59 Super-super Morbid Obesity: BMI 60+ Significantly low or high BMI is associated with higher medical risk.  Weight management advised as adjunct to other disease management and risk reduction treatments    DVT prophylaxis: heparin gtt IV fluids: no continuous IV fluids  Nutrition: cardiac / diabetic diet Central lines / other devices: none  Code Status: FULL CODE ACP documentation reviewed:  none on file in [name]  TOC needs: TBD Medical barriers to dispo:Marland Kitchen Severe HFrEF, Expected medical readiness for discharge several more days w/ diuresis, cardiology interventions.              Subjective / Brief ROS:  Patient reports SOB about baseline Denies CP Pain controlled.  Denies new weakness.  Tolerating diet.  Reports no concerns w/ urination/defecation.   Family Communication: wife at bedside on rounds     Objective Findings:  Vitals:   04/10/23 0038 04/10/23 0333 04/10/23 0843 04/10/23 1343  BP: 98/85 101/83 113/80 (!) 85/71  Pulse: 82 95 95   Resp: 18 18 16 16   Temp: 98.2 F (36.8 C) 97.6 F (36.4 C) 98.5 F (36.9 C) 98.1 F (36.7 C)  TempSrc: Oral Oral Oral   SpO2: 95% 99% 96% 95%  Weight:      Height:        Intake/Output Summary (Last 24 hours)  at 04/10/2023 1713 Last data filed at 04/10/2023 1417 Gross per 24 hour  Intake 134.69 ml  Output 1800 ml  Net -1665.31 ml   Filed Weights   04/09/23 0110  Weight: 76.2 kg    Examination:  Physical Exam Constitutional:      General: He is not in acute distress. Cardiovascular:     Heart sounds: Heart sounds are distant.  Pulmonary:     Effort: Pulmonary effort is normal.     Breath sounds: Rales present.  Abdominal:     Palpations: Abdomen is soft.  Skin:    General: Skin is warm and dry.  Neurological:     General: No focal deficit  present.     Mental Status: He is alert and oriented to person, place, and time.  Psychiatric:        Mood and Affect: Mood normal.        Behavior: Behavior normal.          Scheduled Medications:   aspirin EC  81 mg Oral Daily   atorvastatin  80 mg Oral q1800   busPIRone  5 mg Oral BID   digoxin  0.125 mg Oral Daily   insulin aspart  0-5 Units Subcutaneous QHS   insulin aspart  0-9 Units Subcutaneous TID WC   isosorbide mononitrate  30 mg Oral Daily   losartan  12.5 mg Oral Daily   melatonin  2.5 mg Oral QHS   metoprolol succinate  12.5 mg Oral Daily   mometasone-formoterol  2 puff Inhalation BID   nicotine  21 mg Transdermal Daily   pantoprazole  40 mg Oral Daily   spironolactone  12.5 mg Oral Daily   [START ON 04/11/2023] Warfarin - Pharmacist Dosing Inpatient   Does not apply q1600    Continuous Infusions:  heparin 1,350 Units/hr (04/10/23 0937)   magnesium sulfate bolus IVPB      PRN Medications:  acetaminophen **OR** acetaminophen, albuterol, guaiFENesin-dextromethorphan, HYDROcodone-acetaminophen, morphine injection, nitroGLYCERIN, ondansetron **OR** ondansetron (ZOFRAN) IV  Antimicrobials from admission:  Anti-infectives (From admission, onward)    None           Data Reviewed:  I have personally reviewed the following...  CBC: Recent Labs  Lab 04/09/23 0121 04/10/23 0812  WBC 9.7 11.4*  NEUTROABS 7.3  --   HGB 15.4 15.0  HCT 46.5 43.1  MCV 88.2 85.3  PLT 215 212   Basic Metabolic Panel: Recent Labs  Lab 04/09/23 0121 04/10/23 0159  NA 133* 136  K 4.5 3.8  CL 102 102  CO2 21* 26  GLUCOSE 349* 114*  BUN 23* 25*  CREATININE 1.17 1.26*  CALCIUM 8.8* 8.7*   GFR: Estimated Creatinine Clearance: 63.9 mL/min (A) (by C-G formula based on SCr of 1.26 mg/dL (H)). Liver Function Tests: Recent Labs  Lab 04/09/23 0121  AST 29  ALT 40  ALKPHOS 90  BILITOT 1.9*  PROT 6.0*  ALBUMIN 3.0*   No results for input(s): "LIPASE", "AMYLASE"  in the last 168 hours. No results for input(s): "AMMONIA" in the last 168 hours. Coagulation Profile: Recent Labs  Lab 04/09/23 1924  INR 1.1   Cardiac Enzymes: No results for input(s): "CKTOTAL", "CKMB", "CKMBINDEX", "TROPONINI" in the last 168 hours. BNP (last 3 results) No results for input(s): "PROBNP" in the last 8760 hours. HbA1C: Recent Labs    04/09/23 0741  HGBA1C 12.0*   CBG: Recent Labs  Lab 04/09/23 1128 04/09/23 1711 04/09/23 2323 04/10/23 0844 04/10/23 1338  GLUCAP 112*  212* 272* 278* 237*   Lipid Profile: No results for input(s): "CHOL", "HDL", "LDLCALC", "TRIG", "CHOLHDL", "LDLDIRECT" in the last 72 hours. Thyroid Function Tests: No results for input(s): "TSH", "T4TOTAL", "FREET4", "T3FREE", "THYROIDAB" in the last 72 hours. Anemia Panel: No results for input(s): "VITAMINB12", "FOLATE", "FERRITIN", "TIBC", "IRON", "RETICCTPCT" in the last 72 hours. Most Recent Urinalysis On File:     Component Value Date/Time   COLORURINE YELLOW (A) 09/03/2022 0916   APPEARANCEUR CLEAR (A) 09/03/2022 0916   LABSPEC 1.025 09/03/2022 0916   PHURINE 5.0 09/03/2022 0916   GLUCOSEU >=500 (A) 09/03/2022 0916   HGBUR NEGATIVE 09/03/2022 0916   BILIRUBINUR NEGATIVE 09/03/2022 0916   KETONESUR NEGATIVE 09/03/2022 0916   PROTEINUR NEGATIVE 09/03/2022 0916   NITRITE NEGATIVE 09/03/2022 0916   LEUKOCYTESUR NEGATIVE 09/03/2022 0916   Sepsis Labs: @LABRCNTIP (procalcitonin:4,lacticidven:4) Microbiology: No results found for this or any previous visit (from the past 240 hours).    Radiology Studies last 3 days: ECHOCARDIOGRAM COMPLETE Result Date: 04/09/2023    ECHOCARDIOGRAM REPORT   Patient Name:   ORBIN MAYEUX Carneiro Date of Exam: 04/09/2023 Medical Rec #:  [id number]    Height:       69.0 in Accession #:    [id number]   Weight:       168.0 lb Date of Birth:  06/22/1965    BSA:          1.918 m Patient Age:    57 years     BP:           100/88 mmHg Patient Gender: M            HR:            100 bpm. Exam Location:  [facility] Procedure: 2D Echo, Cardiac Doppler, Color Doppler, Strain Analysis and 3D Echo            (Both Spectral and Color Flow Doppler were utilized during            procedure). Indications:     CHF-acute systolic I50.21  History:         Patient has prior history of Echocardiogram examinations, most                  recent 09/04/2022. CAD, Stroke; Risk Factors:Diabetes and                  Hypertension.  Sonographer:     Cristela Blue Referring Phys:  [id number] [id number] [facility] [facility] Diagnosing Phys: Windell Norfolk  Sonographer Comments: Global longitudinal strain was attempted. IMPRESSIONS  1. Large LV thrombus noted. Left ventricular ejection fraction, by estimation, is 10-15%%. The left ventricle has severely decreased function. The left ventricle demonstrates regional wall motion abnormalities (see scoring diagram/findings for description). The left ventricular internal cavity size was severely dilated. Left ventricular diastolic parameters are consistent with Grade II diastolic dysfunction (pseudonormalization).  2. Right ventricular systolic function is mildly reduced. The right ventricular size is mildly enlarged. There is moderately elevated pulmonary artery systolic pressure. The estimated right ventricular systolic pressure is 45.4 mmHg.  3. Left atrial size was mildly dilated.  4. The mitral valve is normal in structure. Mild mitral valve regurgitation.  5. The aortic valve is tricuspid. There is mild thickening of the aortic valve. Aortic valve regurgitation is trivial. FINDINGS  Left Ventricle: Large LV thrombus noted. Left ventricular ejection fraction, by estimation, is 10-15%%. The left ventricle has severely decreased function. The left ventricle demonstrates regional wall motion abnormalities. The left  ventricular internal  cavity size was severely dilated. There is no left ventricular hypertrophy. Left ventricular diastolic parameters are consistent with Grade II diastolic  dysfunction (pseudonormalization).  LV Wall Scoring: The mid and distal anterior septum, entire apex, entire inferior wall, and mid inferoseptal segment are akinetic. The anterior wall, antero-lateral wall, posterior wall, basal anteroseptal segment, and basal inferoseptal segment are hypokinetic. Right Ventricle: The right ventricular size is mildly enlarged. No increase in right ventricular wall thickness. Right ventricular systolic function is mildly reduced. There is moderately elevated pulmonary artery systolic pressure. The tricuspid regurgitant velocity is 3.18 m/s, and with an assumed right atrial pressure of 5 mmHg, the estimated right ventricular systolic pressure is 45.4 mmHg. Left Atrium: Left atrial size was mildly dilated. Right Atrium: Right atrial size was normal in size. Pericardium: There is no evidence of pericardial effusion. Mitral Valve: The mitral valve is normal in structure. Mild mitral valve regurgitation. Tricuspid Valve: The tricuspid valve is normal in structure. Tricuspid valve regurgitation is mild. Aortic Valve: The aortic valve is tricuspid. There is mild thickening of the aortic valve. Aortic valve regurgitation is trivial. Aortic valve mean gradient measures 1.0 mmHg. Aortic valve peak gradient measures 1.5 mmHg. Aortic valve area, by VTI measures 3.71 cm. Pulmonic Valve: The pulmonic valve was not well visualized. Pulmonic valve regurgitation is trivial. Aorta: The aortic root is normal in size and structure. IAS/Shunts: The interatrial septum was not well visualized.  LEFT VENTRICLE PLAX 2D LVIDd:         6.70 cm      Diastology LVIDs:         6.50 cm      LV e' medial:    4.68 cm/s LV PW:         1.10 cm      LV E/e' medial:  15.3 LV IVS:        0.80 cm      LV e' lateral:   4.57 cm/s LVOT diam:     2.20 cm      LV E/e' lateral: 15.7 LV SV:         26 LV SV Index:   14 LVOT Area:     3.80 cm  LV Volumes (MOD) LV vol d, MOD A2C: 202.0 ml LV vol d, MOD A4C: 279.0 ml LV vol s,  MOD A2C: 189.0 ml LV vol s, MOD A4C: 259.0 ml LV SV MOD A2C:     13.0 ml LV SV MOD A4C:     279.0 ml LV SV MOD BP:      12.8 ml RIGHT VENTRICLE RV Basal diam:  4.60 cm RV Mid diam:    3.20 cm RV S prime:     8.16 cm/s TAPSE (M-mode): 1.3 cm LEFT ATRIUM             Index        RIGHT ATRIUM           Index LA diam:        4.40 cm 2.29 cm/m   RA Area:     16.30 cm LA Vol (A2C):   62.5 ml 32.58 ml/m  RA Volume:   45.60 ml  23.77 ml/m LA Vol (A4C):   36.3 ml 18.92 ml/m LA Biplane Vol: 49.7 ml 25.91 ml/m  AORTIC VALVE AV Area (Vmax):    3.38 cm AV Area (Vmean):   3.23 cm AV Area (VTI):     3.71 cm AV Vmax:  61.00 cm/s AV Vmean:          39.800 cm/s AV VTI:            0.071 m AV Peak Grad:      1.5 mmHg AV Mean Grad:      1.0 mmHg LVOT Vmax:         54.30 cm/s LVOT Vmean:        33.800 cm/s LVOT VTI:          0.070 m LVOT/AV VTI ratio: 0.98  AORTA Ao Root diam: 3.30 cm MITRAL VALVE               TRICUSPID VALVE MV Area (PHT): 6.83 cm    TR Peak grad:   40.4 mmHg MV Decel Time: 111 msec    TR Vmax:        318.00 cm/s MV E velocity: 71.80 cm/s MV A velocity: 43.50 cm/s  SHUNTS MV E/A ratio:  1.65        Systemic VTI:  0.07 m                            Systemic Diam: 2.20 cm Windell Norfolk Electronically signed by Windell Norfolk Signature Date/Time: 04/09/2023/5:46:27 PM    Final    DG Chest Port 1 View Result Date: 04/09/2023 CLINICAL DATA:  Chest pain EXAM: PORTABLE CHEST 1 VIEW COMPARISON:  03/25/2023 FINDINGS: Cardiomegaly. No confluent airspace opacities, effusions or edema. No acute bony abnormality. IMPRESSION: Cardiomegaly.  No active disease. Electronically Signed   By: Charlett Nose M.D.   On: 04/09/2023 01:36       Time spent: 50 min     Sunnie Nielsen, DO [facility] 04/10/2023, 5:13 PM    Dictation software may have been used to generate the above note. Typos may occur and escape review in typed/dictated notes. Please contact Dr Lyn Hollingshead directly for clarity if  needed.  Staff may message me via secure chat in Epic  but this may not receive an immediate response,  please page me for urgent matters!  If 7PM-7AM, please contact night coverage [email or web address]

## 2023-04-10 NOTE — Progress Notes (Signed)
 Heart Failure Stewardship Pharmacy Note  PCP: Marguarite Arbour, MD PCP-Cardiologist: None  HPI: Kenneth [name] is a 58 y.o. male with CAD s/p PCI in 08/2022, hypertension, GERD, type 2 diabetes, hyperlipidemia, and tobacco use who presented with worsening shortness of breath over several months. On admission, BNP was 1185.6, HS-troponin was 22 > 21, A1c is 12, and UDS positive for trycyclic. Chest x-ray noted no active disease.     Pertinent cardiac history: PCI performed to RCA in 06/2008. Experienced TIA in 12/2015. Echo 12/2015 showed LVEF reduced to 30-35%. Echo 01/2017 showed LVEF mildly improved to 35-40%. Right PICA infarct in 02/2017. Echo 02/2017 showed LVEF improved to 45-50%. Admitted 08/2022 for acute MI where DES was placed to OM1 and RCA with CTO and faint collaterals right to right and left to right. Echo at that time showed LVEF reduced to 20-25% with severely dilated LV, grade II diastolic dysfunction, mildly reduced RV function, mildly dilated atria. Echo this admission noted LVEF down to 10-15% with grade II diastolic dysfunction  Pertinent Lab Values: Creatinine  Date Value Ref Range Status  07/15/2013 1.14 0.60 - 1.30 mg/dL Final   Creatinine, Ser  Date Value Ref Range Status  04/10/2023 1.26 (H) 0.61 - 1.24 mg/dL Final   BUN  Date Value Ref Range Status  04/10/2023 25 (H) 6 - 20 mg/dL Final  16/10/9602 8 6 - 24 mg/dL Final  54/09/8117 13 7 - 18 mg/dL Final   Potassium  Date Value Ref Range Status  04/10/2023 3.8 3.5 - 5.1 mmol/L Final  07/15/2013 3.5 3.5 - 5.1 mmol/L Final   Sodium  Date Value Ref Range Status  04/10/2023 136 135 - 145 mmol/L Final  07/30/2014 142 134 - 144 mmol/L Final  07/15/2013 133 (L) 136 - 145 mmol/L Final   B Natriuretic Peptide  Date Value Ref Range Status  04/09/2023 1,185.6 (H) 0.0 - 100.0 pg/mL Final    Comment:    Performed at [facility], [address] [place], [place] [address]   Magnesium  Date Value Ref  Range Status  09/03/2022 2.1 1.7 - 2.4 mg/dL Final    Comment:    Performed at [facility], [address] [place], [place] [address]   Hemoglobin A1C  Date Value Ref Range Status  07/15/2013 13.0 (H) 4.2 - 6.3 % Final    Comment:    The American Diabetes Association recommends that a primary goal of therapy should be <7% and that physicians should reevaluate the treatment regimen in patients with HbA1c values consistently >8%.    Hgb A1c MFr Bld  Date Value Ref Range Status  04/09/2023 12.0 (H) 4.8 - 5.6 % Final    Comment:    (NOTE) Pre diabetes:          5.7%-6.4%  Diabetes:              >6.4%  Glycemic control for   <7.0% adults with diabetes    TSH  Date Value Ref Range Status  02/20/2017 0.765 0.350 - 4.500 uIU/mL Final    Comment:    Performed by a 3rd Generation assay with a functional sensitivity of <=0.01 uIU/mL.    Vital Signs: Temp:  [97.6 F (36.4 C)-98.5 F (36.9 C)] 98.5 F (36.9 C) (03/19 0843) Pulse Rate:  [82-102] 95 (03/19 0843) Cardiac Rhythm: Normal sinus rhythm;Bundle branch block (03/19 1014) Resp:  [16-31] 16 (03/19 0843) BP: (98-131)/(79-97) 113/80 (03/19 0843) SpO2:  [92 %-100 %] 96 % (  03/19 0843)  Intake/Output Summary (Last 24 hours) at 04/10/2023 1148 Last data filed at 04/10/2023 0303 Gross per 24 hour  Intake 254.69 ml  Output 1650 ml  Net -1395.31 ml    Current Heart Failure Medications:  Loop diuretic: furosemide 40 mg IV daily Beta-Blocker: metoprolol succinate 12.5 mg daily ACEI/ARB/ARNI: lisinopril 40 mg daily MRA: spironolactone 12.5 mg daily SGLT2i: none  Prior to admission Heart Failure Medications:  Loop diuretic: none Beta-Blocker: metoprolol succinate 12.5 mg daily ACEI/ARB/ARNI: lisinopril 40 mg daily MRA: spironolactone 12.5 mg daily SGLT2i: none  Assessment: 1. Acute on chronic systolic heart failure (LVEF 08/2022 of 20-25%) with grade II diastolic dysfunction, mildly reduced RV function, due to  ICM. NYHA class III symptoms.  -Symptoms: Reports shortness of breath is improved. He still has labored breathing with minimal exertion. He appears very fatigued. Suspect he understates symptoms. -Volume: No LEE or abdominal swelling. BNP was significantly elevated on admission, though chest XR did not show fluid. JVD is improved since yesterday. Creatinine bumped slightly, though unsure if this is due to being euvolemic or low output.  -Hemodynamics: BP normal. CO2 is is improved and extremities are warm. Reports strong appetite. Very fatigued with malaise, suspect chronic low output heart failure. -BB: Currently on metoprolol succinate 12.5 mg daily. Given uncertainty of cardiac output -ACEI/ARB/ARNI: Continue lisinopril 40 mg was ordered daily, but never given. With BP at lower end of normal, would recommend starting with losartan instead. Could consider transition to [facility] with patient assistance if following up in clinic. -MRA: Continue spironolactone 12.5 mg daily.  -SGLT2i: Would benefit from SGLT2i when A1c is <10.  Currently 12.  Plan: 1) Medication changes recommended at this time: -Discussed with [facility] and patient transitioned to losartan 12.5 mg daily. Also will receive education/recommendations from AHF team while inpatient. -Will sign off at this time and continue to follow alongside AHF team. Please reach out with any questions or medication assistance needs.  2) Patient assistance: -Patient is currently uninsured. He would likely qualify for patient assistance  3) Education: - Patient has been educated on current HF medications and potential additions to HF medication regimen - Patient verbalizes understanding that over the next few months, these medication doses may change and more medications may be added to optimize HF regimen - Patient has been educated on basic disease state pathophysiology and goals of therapy  Medication Assistance / Insurance Benefits Check: Does the  patient have prescription insurance?    Type of insurance plan:  Does the patient qualify for medication assistance through manufacturers or grants? Pending  Eligible grants and/or patient assistance programs: [employer]  Medication assistance applications in progress: none  Medication assistance applications approved: none Approved medication assistance renewals will be completed by: [facility] cardiology  Outpatient Pharmacy: Prior to admission outpatient pharmacy: [facility]      Please do not hesitate to reach out with questions or concerns,  Enos Fling, PharmD, CPP, BCPS Heart Failure Pharmacist  Phone - [phone] 04/10/2023 11:48 AM

## 2023-04-10 NOTE — Hospital Course (Addendum)
 Hospital course / significant events:   HPI: Kenneth [name] is a 58 y.o. male  with a past medical history of CAD s/p remote PCI to RCA and PCI to OM1 in 2024, chronic HFrEF uncontrolled DM2, HLD, HTN, hx CVA, ongoing tobacco use who presented to the ED on 04/09/2023 for chest pain, shortness of breath.   03/18: admitted to hospitalist service. EF 10-15%, severely reduced LV function, mildly reduced RV function with moderately elevated PASP.  Large LV thrombus noted. Started heparin gtt.  03/19: Cardiology saw pt this morning. Echo results reviewed w/ patient and family. Explained significantly weak heart function and resultant LV apical thrombus. Transition to warfarin. Plan transition to po diuresis. Pt was wanting to elave AMA but agreeable to stay. Cardiology planning for cardiac cath tomorrow, will hold warfarin for now 03/20: RHC ***   Consultants:  Cardiology   Procedures/Surgeries: 04/11/23 R heart cath ***      ASSESSMENT & PLAN:    # Acute on chronic HFrEF, severely reduced EF # Coronary artery disease # Chronic LBBB RHC today 04/11/23 *** aspirin 81 mg daily. atorvastatin 80 mg daily  Digoxin 0.125 mg daily Furosemide per cardiology  Isosorbide mononitrate 30 mg daily  losartan 12.5 mg daily.  metoprolol XL 12.5 mg daily spironolactone 12.5 mg daily significantly elevated A1c will defer SGLT2  R Cardiac cath planned tomorrow   # LV apical thrombus # Subtherapeutic INR Warfarin held for now planning cath tm Heparin gtt   # Elevated troponin, have reasonably r/o ACS Minimally elevated and flat trending troponin most consistent with demand/supply mismatch and not ACS    # Tobacco abuse Strongly encouraged complete cessation, nicotine patches.      No concerns based on BMI: Body mass index is 24.81 kg/m.  Underweight - under 18  overweight - 25 to 29 obese - 30 or more Class 1 obesity: BMI of 30.0 to 34 Class 2 obesity: BMI of 35.0 to 39 Class 3 obesity:  BMI of 40.0 to 49 Super Morbid Obesity: BMI 50-59 Super-super Morbid Obesity: BMI 60+ Significantly low or high BMI is associated with higher medical risk.  Weight management advised as adjunct to other disease management and risk reduction treatments    DVT prophylaxis: heparin gtt IV fluids: no continuous IV fluids  Nutrition: cardiac / diabetic diet Central lines / other devices: none  Code Status: FULL CODE ACP documentation reviewed:  none on file in [name]  TOC needs: TBD Medical barriers to dispo:Marland Kitchen Severe HFrEF, Expected medical readiness for discharge several more days w/ diuresis, cardiology interventions.

## 2023-04-11 ENCOUNTER — Encounter
Admission: EM | Disposition: A | Discharge status: Other Acute Inpatient Hospital | Payer: Self-pay | Source: Home / Self Care | Attending: Osteopathic Medicine

## 2023-04-11 ENCOUNTER — Encounter: Payer: Self-pay | Admitting: Cardiology

## 2023-04-11 DIAGNOSIS — I214 Non-ST elevation (NSTEMI) myocardial infarction: Secondary | ICD-10-CM

## 2023-04-11 HISTORY — PX: RIGHT HEART CATH: CATH118263

## 2023-04-11 LAB — POCT I-STAT EG7
Acid-Base Excess: 2 mmol/L (ref 0.0–2.0)
Bicarbonate: 27.5 mmol/L (ref 20.0–28.0)
Calcium, Ion: 1.09 mmol/L — ABNORMAL LOW (ref 1.15–1.40)
HCT: 44 % (ref 39.0–52.0)
Hemoglobin: 15 g/dL (ref 13.0–17.0)
O2 Saturation: 60 %
Potassium: 3.4 mmol/L — ABNORMAL LOW (ref 3.5–5.1)
Sodium: 140 mmol/L (ref 135–145)
TCO2: 29 mmol/L (ref 22–32)
pCO2, Ven: 42.8 mmHg — ABNORMAL LOW (ref 44–60)
pH, Ven: 7.415 (ref 7.25–7.43)
pO2, Ven: 31 mmHg — CL (ref 32–45)

## 2023-04-11 LAB — CBC
HCT: 42.4 % (ref 39.0–52.0)
Hemoglobin: 14.5 g/dL (ref 13.0–17.0)
MCH: 28.9 pg (ref 26.0–34.0)
MCHC: 34.2 g/dL (ref 30.0–36.0)
MCV: 84.6 fL (ref 80.0–100.0)
Platelets: 215 10*3/uL (ref 150–400)
RBC: 5.01 MIL/uL (ref 4.22–5.81)
RDW: 15 % (ref 11.5–15.5)
WBC: 8.8 10*3/uL (ref 4.0–10.5)
nRBC: 0 % (ref 0.0–0.2)

## 2023-04-11 LAB — POCT I-STAT 7, (LYTES, BLD GAS, ICA,H+H)
Acid-Base Excess: 2 mmol/L (ref 0.0–2.0)
Bicarbonate: 23.8 mmol/L (ref 20.0–28.0)
Calcium, Ion: 1.13 mmol/L — ABNORMAL LOW (ref 1.15–1.40)
HCT: 44 % (ref 39.0–52.0)
Hemoglobin: 15 g/dL (ref 13.0–17.0)
O2 Saturation: 99 %
Potassium: 3.5 mmol/L (ref 3.5–5.1)
Sodium: 137 mmol/L (ref 135–145)
TCO2: 25 mmol/L (ref 22–32)
pCO2 arterial: 28.7 mmHg — ABNORMAL LOW (ref 32–48)
pH, Arterial: 7.527 — ABNORMAL HIGH (ref 7.35–7.45)
pO2, Arterial: 104 mmHg (ref 83–108)

## 2023-04-11 LAB — BASIC METABOLIC PANEL
Anion gap: 9 (ref 5–15)
BUN: 22 mg/dL — ABNORMAL HIGH (ref 6–20)
CO2: 27 mmol/L (ref 22–32)
Calcium: 8.4 mg/dL — ABNORMAL LOW (ref 8.9–10.3)
Chloride: 101 mmol/L (ref 98–111)
Creatinine, Ser: 1.07 mg/dL (ref 0.61–1.24)
GFR, Estimated: >60 mL/min (ref >60)
Glucose, Bld: 299 mg/dL — ABNORMAL HIGH (ref 70–99)
Potassium: 3.6 mmol/L (ref 3.5–5.1)
Sodium: 137 mmol/L (ref 135–145)

## 2023-04-11 LAB — HEPARIN LEVEL (UNFRACTIONATED)
Heparin Unfractionated: 0.42 [IU]/mL (ref 0.30–0.70)
Heparin Unfractionated: 0.26 [IU]/mL — ABNORMAL LOW (ref 0.30–0.70)

## 2023-04-11 LAB — GLUCOSE, CAPILLARY
Glucose-Capillary: 243 mg/dL — ABNORMAL HIGH (ref 70–99)
Glucose-Capillary: 264 mg/dL — ABNORMAL HIGH (ref 70–99)
Glucose-Capillary: 333 mg/dL — ABNORMAL HIGH (ref 70–99)
Glucose-Capillary: 386 mg/dL — ABNORMAL HIGH (ref 70–99)

## 2023-04-11 LAB — PROTIME-INR
INR: 1.2 (ref 0.8–1.2)
Prothrombin Time: 15.5 s — ABNORMAL HIGH (ref 11.4–15.2)

## 2023-04-11 SURGERY — RIGHT HEART CATH
Anesthesia: Moderate Sedation

## 2023-04-11 MED ORDER — POTASSIUM CHLORIDE CRYS ER 20 MEQ PO TBCR
40.0000 meq | EXTENDED_RELEASE_TABLET | Freq: Once | ORAL | Status: AC
  Administered 2023-04-11: 40 meq via ORAL
  Filled 2023-04-11: qty 2

## 2023-04-11 MED ORDER — MORPHINE SULFATE (PF) 2 MG/ML IV SOLN: 2.0000 mg | INTRAVENOUS | Status: DC | PRN

## 2023-04-11 MED ORDER — HYDROCODONE-ACETAMINOPHEN 5-325 MG PO TABS: 1.0000 | ORAL_TABLET | ORAL | Status: DC | PRN

## 2023-04-11 MED ORDER — SODIUM CHLORIDE 0.9 % IV SOLN: INTRAVENOUS | Status: DC

## 2023-04-11 MED ORDER — LOSARTAN POTASSIUM 25 MG PO TABS: 12.5000 mg | ORAL_TABLET | Freq: Every day | ORAL | Status: DC

## 2023-04-11 MED ORDER — ONDANSETRON HCL 4 MG PO TABS: 4.0000 mg | ORAL_TABLET | Freq: Four times a day (QID) | ORAL | Status: DC | PRN

## 2023-04-11 MED ORDER — ACETAMINOPHEN 325 MG PO TABS: 650.0000 mg | ORAL_TABLET | Freq: Four times a day (QID) | ORAL | Status: DC | PRN

## 2023-04-11 MED ORDER — SODIUM CHLORIDE 0.9 % IV SOLN: 250.0000 mL | INTRAVENOUS | Status: DC | PRN

## 2023-04-11 MED ORDER — WARFARIN SODIUM 5 MG PO TABS
5.0000 mg | ORAL_TABLET | Freq: Once | ORAL | Status: DC
  Filled 2023-04-11: qty 1

## 2023-04-11 MED ORDER — DIGOXIN 125 MCG PO TABS: 0.1250 mg | ORAL_TABLET | Freq: Every day | ORAL | Status: DC

## 2023-04-11 MED ORDER — LIDOCAINE HCL (PF) 1 % IJ SOLN
INTRAMUSCULAR | Status: DC | PRN
  Administered 2023-04-11: 2 mL

## 2023-04-11 MED ORDER — HEPARIN (PORCINE) IN NACL 2000-0.9 UNIT/L-% IV SOLN
INTRAVENOUS | Status: DC | PRN
  Administered 2023-04-11: 1000 mL

## 2023-04-11 MED ORDER — INSULIN ASPART 100 UNIT/ML IJ SOLN: 0.0000 [IU] | Freq: Every day | INTRAMUSCULAR | Status: DC

## 2023-04-11 MED ORDER — MELATONIN 5 MG PO TABS: 2.5000 mg | ORAL_TABLET | Freq: Every day | ORAL | Status: DC

## 2023-04-11 MED ORDER — WARFARIN SODIUM 5 MG PO TABS: ORAL_TABLET | ORAL | Status: DC

## 2023-04-11 MED ORDER — ATORVASTATIN CALCIUM 80 MG PO TABS: 80.0000 mg | ORAL_TABLET | Freq: Every day | ORAL | Status: DC

## 2023-04-11 MED ORDER — ENOXAPARIN SODIUM 100 MG/ML IJ SOSY
100.0000 mg | PREFILLED_SYRINGE | INTRAMUSCULAR | Status: DC
  Filled 2023-04-11: qty 1

## 2023-04-11 MED ORDER — ISOSORBIDE MONONITRATE ER 30 MG PO TB24: 30.0000 mg | ORAL_TABLET | Freq: Every day | ORAL | Status: DC

## 2023-04-11 MED ORDER — ENOXAPARIN SODIUM 100 MG/ML IJ SOSY: 100.0000 mg | PREFILLED_SYRINGE | INTRAMUSCULAR | Status: DC

## 2023-04-11 MED ORDER — INSULIN ASPART 100 UNIT/ML IJ SOLN: 0.0000 [IU] | Freq: Three times a day (TID) | INTRAMUSCULAR | Status: DC

## 2023-04-11 MED ORDER — METOPROLOL SUCCINATE ER 25 MG PO TB24: 12.5000 mg | ORAL_TABLET | Freq: Every day | ORAL | Status: DC

## 2023-04-11 MED ORDER — HEPARIN (PORCINE) 25000 UT/250ML-% IV SOLN: 12.0000 [IU]/kg/h | INTRAVENOUS | Status: DC

## 2023-04-11 MED ORDER — ASPIRIN 81 MG PO TBEC: 81.0000 mg | DELAYED_RELEASE_TABLET | Freq: Every day | ORAL | Status: DC

## 2023-04-11 MED ORDER — ASPIRIN 81 MG PO CHEW: 81.0000 mg | CHEWABLE_TABLET | ORAL | Status: DC

## 2023-04-11 MED ORDER — NITROGLYCERIN 0.4 MG SL SUBL: 0.4000 mg | SUBLINGUAL_TABLET | SUBLINGUAL | Status: DC | PRN

## 2023-04-11 MED ORDER — SODIUM CHLORIDE 0.9% FLUSH: 3.0000 mL | INTRAVENOUS | Status: DC | PRN

## 2023-04-11 MED ORDER — HEPARIN (PORCINE) 25000 UT/250ML-% IV SOLN
1500.0000 [IU]/h | INTRAVENOUS | Status: DC
  Administered 2023-04-11: 1350 [IU]/h via INTRAVENOUS
  Administered 2023-04-12: 1500 [IU]/h via INTRAVENOUS
  Filled 2023-04-11 (×2): qty 250

## 2023-04-11 MED ORDER — SODIUM CHLORIDE 0.9% FLUSH
3.0000 mL | Freq: Two times a day (BID) | INTRAVENOUS | Status: DC
  Administered 2023-04-11: 3 mL via INTRAVENOUS

## 2023-04-11 MED ORDER — ACETAMINOPHEN 650 MG RE SUPP: 650.0000 mg | Freq: Four times a day (QID) | RECTAL | Status: DC | PRN

## 2023-04-11 MED ORDER — PANTOPRAZOLE SODIUM 40 MG PO TBEC: 40.0000 mg | DELAYED_RELEASE_TABLET | Freq: Every day | ORAL | Status: DC

## 2023-04-11 MED ORDER — ASPIRIN 81 MG PO CHEW
81.0000 mg | CHEWABLE_TABLET | ORAL | Status: AC
  Administered 2023-04-11: 81 mg via ORAL
  Filled 2023-04-11: qty 1

## 2023-04-11 MED ORDER — SPIRONOLACTONE 25 MG PO TABS: 12.5000 mg | ORAL_TABLET | Freq: Every day | ORAL | Status: DC

## 2023-04-11 MED ORDER — ONDANSETRON HCL 4 MG/2ML IJ SOLN: 4.0000 mg | Freq: Four times a day (QID) | INTRAMUSCULAR | Status: DC | PRN

## 2023-04-11 MED ORDER — MOMETASONE FURO-FORMOTEROL FUM 200-5 MCG/ACT IN AERO: 2.0000 | INHALATION_SPRAY | Freq: Two times a day (BID) | RESPIRATORY_TRACT | Status: DC

## 2023-04-11 MED ORDER — GUAIFENESIN-DM 100-10 MG/5ML PO SYRP: 5.0000 mL | ORAL_SOLUTION | ORAL | Status: DC | PRN

## 2023-04-11 SURGICAL SUPPLY — 9 items
CATH BALLN WEDGE 5F 110CM (CATHETERS) IMPLANT
COVER EZ STRL 42X30 (DRAPES) IMPLANT
DRAPE BRACHIAL (DRAPES) IMPLANT
GUIDEWIRE EMER 3M J .025X150CM (WIRE) IMPLANT
PACK CARDIAC CATH (CUSTOM PROCEDURE TRAY) ×1 IMPLANT
PROTECTION STATION PRESSURIZED (MISCELLANEOUS) ×1 IMPLANT
SET ATX-X65L (MISCELLANEOUS) IMPLANT
SHEATH GLIDE SLENDER 4/5FR (SHEATH) IMPLANT
STATION PROTECTION PRESSURIZED (MISCELLANEOUS) IMPLANT

## 2023-04-11 NOTE — Progress Notes (Signed)
 PHARMACY - ANTICOAGULATION CONSULT NOTE  Pharmacy Consult for heparin infusion and warfarin Indication: Large LV thrombus   No Known Allergies  Patient Measurements: Height: 5\' 9"  (175.3 cm) Weight: 76.2 kg (168 lb) IBW/kg (Calculated) : 70.7 Heparin Dosing Weight: 76.2 kg  Vital Signs: Temp: 97.6 F (36.4 C) (03/20 0524) BP: 101/80 (03/20 0524) Pulse Rate: 89 (03/20 0524)  Labs: Recent Labs    04/09/23 0121 04/09/23 0307 04/09/23 1924 04/10/23 0159 04/10/23 0812 04/11/23 0510  HGB 15.4  --   --   --  15.0 14.5  HCT 46.5  --   --   --  43.1 42.4  PLT 215  --   --   --  212 215  APTT  --   --  32  --   --   --   LABPROT  --   --  14.7  --   --  15.5*  INR  --   --  1.1  --   --  1.2  HEPARINUNFRC  --   --   --  0.47 0.50 0.42  CREATININE 1.17  --   --  1.26*  --  1.07  TROPONINIHS 22* 21*  --   --   --   --    Estimated Creatinine Clearance: 75.3 mL/min (by C-G formula based on SCr of 1.07 mg/dL).  Medical History: Past Medical History:  Diagnosis Date   Coronary artery disease    Diabetes mellitus without complication (HCC)    GERD (gastroesophageal reflux disease)    Hypertension    Stroke (cerebrum) (HCC)     Medications:  Scheduled:   aspirin  81 mg Oral Pre-Cath   aspirin EC  81 mg Oral Daily   atorvastatin  80 mg Oral q1800   busPIRone  5 mg Oral BID   digoxin  0.125 mg Oral Daily   insulin aspart  0-5 Units Subcutaneous QHS   insulin aspart  0-9 Units Subcutaneous TID WC   isosorbide mononitrate  30 mg Oral Daily   losartan  12.5 mg Oral Daily   melatonin  2.5 mg Oral QHS   metoprolol succinate  12.5 mg Oral Daily   mometasone-formoterol  2 puff Inhalation BID   nicotine  21 mg Transdermal Daily   pantoprazole  40 mg Oral Daily   sodium chloride flush  3 mL Intravenous Q12H   spironolactone  12.5 mg Oral Daily   Warfarin - Pharmacist Dosing Inpatient   Does not apply q1600   Assessment: 58 y.o. male with CAD s/p PCI in 08/2022, hypertension,  GERD, type 2 diabetes, hyperlipidemia, and tobacco use who presented with worsening shortness of breath over several months. Large LV thrombus noted on ECHO. No chronic anticoagulation noted based on dispense record  Baseline Labs: H&H/PLT wnl, INR/aPTT   Results: Date Time HL INR Comments  3/19 0159 HL=0.47  Heparin therapeutic x 1, INR 1.1 at baseline  3/19 0812 HL=0.50 1.1 Heparin therapeutic x 2  3/20 0510 0.42 1.2 Heparin therapeutic x 3         Goal of Therapy:  Heparin level 0.3-0.7 units/ml Monitor platelets by anticoagulation protocol: Yes   Warfarin for LV thrombus: Goal INR 2-3  Plan:  Patient currently on heparin infusion and new consult to initiate warfarin received from cardiology. Patient uninsured and not good candidate for DOACper provider assessment. [facility] PA setting up patient with outpatient coumadin clinic, pharmacy was consulted to manage warfarin while inpatient. Will bridge until INR  therapeutic. Continue heparin infusion at 1350 units/hr Plan to start warfarin after cardiac cath on 3/20 Will order HL and INR with AM labs Continue to monitor CBC daily while on heparin infusion.  Otelia Sergeant, PharmD, [facility] 04/11/2023 6:06 AM

## 2023-04-11 NOTE — Progress Notes (Signed)
 PHARMACY - ANTICOAGULATION CONSULT NOTE  Pharmacy Consult for heparin infusion and warfarin Indication: Large LV thrombus   No Known Allergies  Patient Measurements: Height: 5\' 9"  (175.3 cm) Weight: 75.1 kg (165 lb 9.1 oz) IBW/kg (Calculated) : 70.7 Heparin Dosing Weight: 76.2 kg  Vital Signs: Temp: 97.9 F (36.6 C) (03/20 0837) Temp Source: Oral (03/20 0837) BP: 101/74 (03/20 1005) Pulse Rate: 93 (03/20 1005)  Labs: Recent Labs    04/09/23 0121 04/09/23 0307 04/09/23 1924 04/10/23 0159 04/10/23 0812 04/11/23 0510 04/11/23 0944 04/11/23 0958  HGB 15.4  --   --   --  15.0 14.5 15.0 15.0  HCT 46.5  --   --   --  43.1 42.4 44.0 44.0  PLT 215  --   --   --  212 215  --   --   APTT  --   --  32  --   --   --   --   --   LABPROT  --   --  14.7  --   --  15.5*  --   --   INR  --   --  1.1  --   --  1.2  --   --   HEPARINUNFRC  --   --   --  0.47 0.50 0.42  --   --   CREATININE 1.17  --   --  1.26*  --  1.07  --   --   TROPONINIHS 22* 21*  --   --   --   --   --   --    Estimated Creatinine Clearance: 75.3 mL/min (by C-G formula based on SCr of 1.07 mg/dL).  Medical History: Past Medical History:  Diagnosis Date   Coronary artery disease    Diabetes mellitus without complication (HCC)    GERD (gastroesophageal reflux disease)    Hypertension    Stroke (cerebrum) (HCC)     Medications:  Scheduled:   [MAR Hold] aspirin EC  81 mg Oral Daily   [MAR Hold] atorvastatin  80 mg Oral q1800   [MAR Hold] busPIRone  5 mg Oral BID   [MAR Hold] digoxin  0.125 mg Oral Daily   enoxaparin (LOVENOX) injection  100 mg Subcutaneous Q24H   [MAR Hold] insulin aspart  0-5 Units Subcutaneous QHS   [MAR Hold] insulin aspart  0-9 Units Subcutaneous TID WC   [MAR Hold] isosorbide mononitrate  30 mg Oral Daily   [MAR Hold] losartan  12.5 mg Oral Daily   [MAR Hold] melatonin  2.5 mg Oral QHS   [MAR Hold] metoprolol succinate  12.5 mg Oral Daily   [MAR Hold] mometasone-formoterol  2 puff  Inhalation BID   [MAR Hold] nicotine  21 mg Transdermal Daily   [MAR Hold] pantoprazole  40 mg Oral Daily   [MAR Hold] potassium chloride  40 mEq Oral Once   sodium chloride flush  3 mL Intravenous Q12H   [MAR Hold] spironolactone  12.5 mg Oral Daily   warfarin  5 mg Oral ONCE-1600   [MAR Hold] Warfarin - Pharmacist Dosing Inpatient   Does not apply q1600   Assessment: 58 y.o. male with CAD s/p PCI in 08/2022, hypertension, GERD, type 2 diabetes, hyperlipidemia, and tobacco use who presented with worsening shortness of breath over several months. Large LV thrombus noted on ECHO. No chronic anticoagulation noted based on dispense record  Baseline Labs: H&H/PLT wnl, INR/aPTT   Results: Date Time HL INR Comments  3/19 0159  HL=0.47  Heparin therapeutic x 1, INR 1.1 at baseline  3/19 0812 HL=0.50 1.1 Heparin therapeutic x 2  3/20 0510 0.42 1.2 Heparin therapeutic x 3, dc heparin. Start enoxaparin and warfarin         Goal of Therapy:  Heparin level 0.3-0.7 units/ml Monitor platelets by anticoagulation protocol: Yes   Warfarin for LV thrombus: Goal INR 2-3  Plan:  Patient uninsured and not good candidate for DOAC per provider assessment. [facility] PA setting up patient with outpatient coumadin clinic, pharmacy was consulted to manage warfarin while inpatient. Will bridge until INR therapeutic. Start enoxaparin 100mg  [place] q 24hrs until INR >/=2 Plan to start warfarin after cardiac cath on 3/20. Warfarin 5mg  po today @1600 . INR with AM labs Continue to monitor CBC daily while on heparin infusion.  Kristin Lamagna Rodriguez-Guzman PharmD, BCPS 04/11/2023 10:28 AM

## 2023-04-11 NOTE — Progress Notes (Signed)
 Advanced Heart Failure Rounding Note  PCP-Cardiologist: Dr. Melton Alar   Subjective:     RHC today with severely elevated left sided filling pressures, CI of 1.67 by assumed Fick, and preserved PAPi. Discussed that given his low output heart failure, recently worsening symptoms it would be best to transfer to [facility] for inpatient LVAD workup and likely implant in the next 1-2 weeks. Plan of care discussed with referring cardiologist as well as patient and family. Patient to be transferred today for advanced therapies evaluation.    Objective:   Weight Range: 75.1 kg Body mass index is 24.45 kg/m.   Vital Signs:   Temp:  [97.6 F (36.4 C)-98.8 F (37.1 C)] 98.2 F (36.8 C) (03/20 1218) Pulse Rate:  [52-102] 52 (03/20 1218) Resp:  [17-28] 17 (03/20 1218) BP: (97-115)/(67-98) 112/98 (03/20 1218) SpO2:  [95 %-100 %] 99 % (03/20 1218) Weight:  [75.1 kg] 75.1 kg (03/20 0837) Last BM Date : 04/08/23  Weight change: Filed Weights   04/09/23 0110 04/11/23 0138 04/11/23 0837  Weight: 76.2 kg 75.1 kg 75.1 kg    Intake/Output:   Intake/Output Summary (Last 24 hours) at 04/11/2023 1507 Last data filed at 04/11/2023 0657 Gross per 24 hour  Intake 858.9 ml  Output 700 ml  Net 158.9 ml      Physical Exam    General: Chronically ill-appearing HEENT: NCAT Cor:  Regular rate & rhythm.  Systolic murmur, JVP mildly elevated, no lower extremity edema Lungs: Normal work of breathing, clear to auscultation bilaterally Abdomen: soft, nontender, nondistended Extremities: no cyanosis, clubbing, rash Neuro: alert & orientedx3, cranial nerves grossly intact. moves all 4 extremities w/o difficulty. Affect pleasant    Telemetry   Sinus in the 90s  Patient Profile   Kenneth [name] is a 58 y.o. male with a PMH of CAD with PCI to the RCA and OM, chronic heart failure with reduced ejection fraction, diabetes, hyperlipidemia, hypertension, prior CVA, tobacco use who is seen today for  evaluation of acute on chronic systolic heart failure at the request of Dr. Darrold Junker.   Assessment/Plan   Acute on chronic systolic heart failure: Echocardiogram reviewed, large LV thrombus and severely dilated LV, 6.7 LVIDD.  NYHA IV symptoms on admission, ACC/AHA Stage D cardiomyopathy. RHC with severely elevated left sided filling pressures and CI of 1.67.  Given recent smoking history would not be currently eligible for a transplant. Will transfer to [facility] for inpatient opitmization with inotropes and IV diuresis, potential MCS, and urgent LVAD workup.  -Long discussion about urgent LVAD referral -Continue losartan 12.5 mg daily, metoprolol 12.5 mg daily for now -Digoxin 0.125 mg daily -Potential barriers to advanced therapies include diabetes, tobacco use, and potential COPD -Continue spironolactone 12.5 mg -No SGLT2 with diabetes - Likely IV milrinone with IV diuresis on arrival   CAD: Known disease, though LAD appears largely open on last catetherization.  Agree that chest pain is likely due to decompensated heart failure. -Continue aspirin, statin, anticoagulation   LV thrombus: Due to severe cardiomyopathy. -Warfarin stopped, would recommend heparin gtt given likely upcoming surgical procedure   Diabetes: Needs extensive control prior to implant.  Tobacco use: Extensive. CT scan reviewed and does not have significant emphysematous changes. - PFTs   Length of Stay: 1  Romie Minus, MD  04/11/2023, 3:07 PM  Advanced Heart Failure Team Pager [phone] (M-F; 7a - 5p)  Please contact [facility] for night-coverage after hours (5p -7a ) and weekends on [email or web address]

## 2023-04-11 NOTE — Plan of Care (Signed)
  Problem: Education: Goal: Knowledge of General Education information will improve Description: Including pain rating scale, medication(s)/side effects and non-pharmacologic comfort measures Outcome: Progressing   Problem: Clinical Measurements: Goal: Respiratory complications will improve Outcome: Progressing   Problem: Clinical Measurements: Goal: Cardiovascular complication will be avoided Outcome: Progressing   Problem: Activity: Goal: Risk for activity intolerance will decrease Outcome: Progressing   Problem: Nutrition: Goal: Adequate nutrition will be maintained Outcome: Progressing   Problem: Pain Managment: Goal: General experience of comfort will improve and/or be controlled Outcome: Progressing

## 2023-04-11 NOTE — Inpatient Diabetes Management (Addendum)
 Inpatient Diabetes Program Recommendations  AACE/ADA: New Consensus Statement on Inpatient Glycemic Control (2015)  Target Ranges:  Prepandial:   less than 140 mg/dL      Peak postprandial:   less than 180 mg/dL (1-2 hours)      Critically ill patients:  140 - 180 mg/dL    Latest Reference Range & Units 09/03/22 07:59 04/09/23 07:41  Hemoglobin A1C 4.8 - 5.6 % 11.4 (H) 12.0 (H)  (H): Data is abnormally high  Latest Reference Range & Units 04/10/23 08:44 04/10/23 13:38 04/10/23 17:47 04/10/23 22:07  Glucose-Capillary 70 - 99 mg/dL 409 (H)  5 units Novolog  237 (H)  3 units Novolog  190 (H)  2 units Novolog  247 (H)  2 units Novolog   (H): Data is abnormally high  Latest Reference Range & Units 04/11/23 08:08  Glucose-Capillary 70 - 99 mg/dL 811 (H)  3 units Novolog   (H): Data is abnormally high   Home DM Meds: Metformin 1000 mg BID (recently resumed taking 1-2 weeks ago) Amaryl 4 mg daily (NOT taking)    Current Orders:  Novolog Sensitive Correction Scale/ SSI (0-9 units) TID AC + HS   MD- Note 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 Discussed that patient taking insulin can have a CDL but they have to report more information regarding DM and control.  Patient does not want to use insulin outpatient.     --Will follow patient during hospitalization--  Ambrose Finland RN, MSN, CDCES Diabetes Coordinator Inpatient Glycemic Control Team Team Pager: [phone] (8a-5p)

## 2023-04-11 NOTE — Plan of Care (Signed)

## 2023-04-11 NOTE — Discharge Summary (Signed)
 Physician Discharge Summary   Patient: Kenneth [name] MRN: [id number]  DOB: 1965/06/06   Admit:     Date of Admission: 04/09/2023 Admitted from: home   Discharge: Date of discharge: 04/11/23 Disposition:  [facility] Condition at discharge: stable  CODE STATUS: FULL CODE     Discharge Physician: Sunnie Nielsen, DO [facility]     PCP: Marguarite Arbour, MD  Recommendations for Outpatient Follow-up:  Pending clinical course at [facility]    Discharge Instructions     Diet - low sodium heart healthy   Complete by: As directed    Increase activity slowly   Complete by: As directed          Discharge Diagnoses: Principal Problem: Coronary Artery Disease  Active Problems: Acute on Chronic HFrEF LV apical thrombus CAD s/p remote PCI to RCA and PCI to OM1 in 2024  Chronic LBBB DM2 HLD HTN Hx CVA Tobacco use        Hospital course / significant events:   HPI: Kenneth [name] is a 58 y.o. male  with a past medical history of CAD s/p remote PCI to RCA and PCI to OM1 in 2024, chronic HFrEF uncontrolled DM2, HLD, HTN, hx CVA, ongoing tobacco use who presented to the ED on 04/09/2023 for chest pain, shortness of breath.   03/18: admitted to hospitalist service. EF 10-15%, severely reduced LV function, mildly reduced RV function with moderately elevated PASP.  Large LV thrombus noted. Started heparin gtt.  03/19: Cardiology saw pt this morning. Echo results reviewed w/ patient and family. Explained significantly weak heart function and resultant LV apical thrombus. Transition to warfarin. Plan transition to po diuresis. Pt was wanting to elave AMA but agreeable to stay. Cardiology planning for cardiac cath tomorrow, will hold warfarin for now 03/20: RHC severe disease, cardiology recs for transfer to [facility] for LVAD eval. Dr Gala Romney is accepting to cardiology service requesting bed on 2C unit.   Consultants:  Cardiology    Procedures/Surgeries: 04/11/23 R heart cath       ASSESSMENT & PLAN:    # Acute on chronic HFrEF, severely reduced EF # Coronary artery disease # Chronic LBBB RHC today 04/11/23 - severe disease  aspirin 81 mg daily. atorvastatin 80 mg daily  Digoxin 0.125 mg daily Furosemide per cardiology  Isosorbide mononitrate 30 mg daily  losartan 12.5 mg daily.  metoprolol XL 12.5 mg daily spironolactone 12.5 mg daily significantly elevated A1c will defer SGLT2  R Cardiac cath planned tomorrow   # LV apical thrombus # Subtherapeutic INR Warfarin restarted today  Pharmacy to dose Bridging w/ lovenox   # Elevated troponin, have reasonably r/o ACS Minimally elevated and flat trending troponin most consistent with demand/supply mismatch and not ACS    # Tobacco abuse # COPD Strongly encouraged complete cessation nicotine patches. Dulera inhaler bid  Robitussin DM prn cough  Albuterol prn wheeze  # Diabetes type 2 Recommend follow inpatient w/ diabetes educator Insulin sliding scale    # Anxiety BuSpar 5 mg bid   No concerns based on BMI: Body mass index is 24.81 kg/m.  Underweight - under 18  overweight - 25 to 29 obese - 30 or more Class 1 obesity: BMI of 30.0 to 34 Class 2 obesity: BMI of 35.0 to 39 Class 3 obesity: BMI of 40.0 to 49 Super Morbid Obesity: BMI 50-59 Super-super Morbid Obesity: BMI 60+ Significantly low or high BMI is associated with higher medical risk.  Weight management advised as adjunct to other disease management and risk reduction treatments    DVT prophylaxis: lovenox IV fluids: no continuous IV fluids  Nutrition: cardiac / diabetic diet Central lines / other devices: none  Code Status: FULL CODE ACP documentation reviewed:  none on file in [facility]          Discharge Instructions  Allergies as of 04/11/2023   No Known Allergies      Medication List     STOP taking these medications    AeroChamber MV inhaler    budesonide-formoterol 160-4.5 MCG/ACT inhaler Commonly known as: SYMBICORT Replaced by: mometasone-formoterol 200-5 MCG/ACT Aero   buPROPion ER 100 MG 12 hr tablet Commonly known as: WELLBUTRIN SR   glimepiride 4 MG tablet Commonly known as: AMARYL   ipratropium-albuterol 0.5-2.5 (3) MG/3ML Soln Commonly known as: DUONEB   lisinopril 40 MG tablet Commonly known as: ZESTRIL   metFORMIN 1000 MG tablet Commonly known as: GLUCOPHAGE   nystatin 100000 UNIT/ML suspension Commonly known as: MYCOSTATIN   omeprazole 20 MG capsule Commonly known as: PRILOSEC Replaced by: pantoprazole 40 MG tablet   predniSONE 10 MG (21) Tbpk tablet Commonly known as: STERAPRED UNI-PAK 21 TAB       TAKE these medications    acetaminophen 325 MG tablet Commonly known as: TYLENOL Take 2 tablets (650 mg total) by mouth every 6 (six) hours as needed for mild pain (pain score 1-3) (or Fever >/= 101).   acetaminophen 650 MG suppository Commonly known as: TYLENOL Place 1 suppository (650 mg total) rectally every 6 (six) hours as needed for mild pain (pain score 1-3) (or Fever >/= 101).   albuterol (2.5 MG/3ML) 0.083% nebulizer solution Commonly known as: PROVENTIL Take 2.5 mg by nebulization every 6 (six) hours as needed for wheezing. What changed: Another medication with the same name was removed. Continue taking this medication, and follow the directions you see here.   aspirin EC 81 MG tablet Take 1 tablet (81 mg total) by mouth daily. Swallow whole.   atorvastatin 80 MG tablet Commonly known as: LIPITOR Take 1 tablet (80 mg total) by mouth daily at 6 PM.   busPIRone 5 MG tablet Commonly known as: BUSPAR Take 1 tablet by mouth 2 (two) times daily.   digoxin 0.125 MG tablet Commonly known as: LANOXIN Take 1 tablet (0.125 mg total) by mouth daily. Start taking on: April 12, 2023   enoxaparin 100 MG/ML injection Commonly known as: LOVENOX Inject 1 mL (100 mg total) into the skin  daily.   guaiFENesin-dextromethorphan 100-10 MG/5ML syrup Commonly known as: ROBITUSSIN DM Take 5 mLs by mouth every 4 (four) hours as needed for cough.   HYDROcodone-acetaminophen 5-325 MG tablet Commonly known as: NORCO/VICODIN Take 1-2 tablets by mouth every 4 (four) hours as needed for moderate pain (pain score 4-6).   insulin aspart 100 UNIT/ML injection Commonly known as: novoLOG Inject 0-5 Units into the skin at bedtime.   insulin aspart 100 UNIT/ML injection Commonly known as: novoLOG Inject 0-9 Units into the skin 3 (three) times daily with meals.   isosorbide mononitrate 30 MG 24 hr tablet Commonly known as: IMDUR Take 1 tablet (30 mg total) by mouth daily.   losartan 25 MG tablet Commonly known as: COZAAR Take 0.5 tablets (12.5 mg total) by mouth daily.   melatonin 5 MG Tabs Take 0.5 tablets (2.5 mg total) by mouth at bedtime.   metoprolol succinate 25 MG 24 hr tablet Commonly known as: TOPROL-XL Take 0.5 tablets (  12.5 mg total) by mouth daily.   mometasone-formoterol 200-5 MCG/ACT Aero Commonly known as: DULERA Inhale 2 puffs into the lungs 2 (two) times daily. Replaces: budesonide-formoterol 160-4.5 MCG/ACT inhaler   morphine (PF) 2 MG/ML injection Inject 1 mL (2 mg total) into the vein every 2 (two) hours as needed.   Nicotine Step 1 21 MG/24HR patch Generic drug: nicotine Place 1 patch (21 mg total) onto the skin daily.   nitroGLYCERIN 0.4 MG SL tablet Commonly known as: NITROSTAT Place 1 tablet (0.4 mg total) under the tongue every 5 (five) minutes as needed for chest pain.   ondansetron 4 MG tablet Commonly known as: ZOFRAN Take 1 tablet (4 mg total) by mouth every 6 (six) hours as needed for nausea.   ondansetron 4 MG/2ML Soln injection Commonly known as: ZOFRAN Inject 2 mLs (4 mg total) into the vein every 6 (six) hours as needed for nausea.   pantoprazole 40 MG tablet Commonly known as: PROTONIX Take 1 tablet (40 mg total) by mouth  daily. Start taking on: April 12, 2023 Replaces: omeprazole 20 MG capsule   spironolactone 25 MG tablet Commonly known as: ALDACTONE Take 0.5 tablets (12.5 mg total) by mouth daily.   warfarin 5 MG tablet Commonly known as: COUMADIN PHARMACY TO DOSE         Follow-up Information     Custovic, Rozell Searing, DO. Go in 1 week(s).   Specialty: Cardiology Why: [facility] Heart failure clinic appointment 04/16/23 at 12:30 PM Cardiology follow up appointment 04/22/23 at 8:30 AM Contact information: [address] [place] [address] [phone]         Marcina Millard, MD. Go to.   Specialty: Cardiology Why: follow up appointment 04/18/23 at 3:15pm, please arrive 15 minutes early. Contact information: [address] [facility] [place] [address] [phone]                 No Known Allergies   Subjective: pt has no concerns, examined following cardiac cath, no chest pain / palpitations    Discharge Exam: BP (!) 112/98 (BP Location: Left Arm)   Pulse (!) 52   Temp 98.2 F (36.8 C) (Oral)   Resp 17   Ht 5\' 9"  (1.753 m)   Wt 75.1 kg   SpO2 99%   BMI 24.45 kg/m  General: Pt is alert, awake, not in acute distress, oriented x3 Cardiovascular: RRR, S1/S2 +, no rubs, no gallops Respiratory: CTA bilaterally, no wheezing, no rhonchi Abdominal: Soft Extremities: no edema, no cyanosis     The results of significant diagnostics from this hospitalization (including imaging, microbiology, ancillary and laboratory) are listed below for reference.     Microbiology: No results found for this or any previous visit (from the past 240 hours).   Labs: BNP (last 3 results) Recent Labs    02/18/23 1348 04/09/23 0121  BNP 697.3* 1,185.6*   Basic Metabolic Panel: Recent Labs  Lab 04/09/23 0121 04/10/23 0159 04/11/23 0510 04/11/23 0944 04/11/23 0958  NA 133* 136 137 140 137  K 4.5 3.8 3.6 3.4* 3.5  CL 102 102 101  --   --    CO2 21* 26 27  --   --   GLUCOSE 349* 114* 299*  --   --   BUN 23* 25* 22*  --   --   CREATININE 1.17 1.26* 1.07  --   --   CALCIUM 8.8* 8.7* 8.4*  --   --    Liver Function  Tests: Recent Labs  Lab 04/09/23 0121  AST 29  ALT 40  ALKPHOS 90  BILITOT 1.9*  PROT 6.0*  ALBUMIN 3.0*   No results for input(s): "LIPASE", "AMYLASE" in the last 168 hours. No results for input(s): "AMMONIA" in the last 168 hours. CBC: Recent Labs  Lab 04/09/23 0121 04/10/23 0812 04/11/23 0510 04/11/23 0944 04/11/23 0958  WBC 9.7 11.4* 8.8  --   --   NEUTROABS 7.3  --   --   --   --   HGB 15.4 15.0 14.5 15.0 15.0  HCT 46.5 43.1 42.4 44.0 44.0  MCV 88.2 85.3 84.6  --   --   PLT 215 212 215  --   --    Cardiac Enzymes: No results for input(s): "CKTOTAL", "CKMB", "CKMBINDEX", "TROPONINI" in the last 168 hours. BNP: Invalid input(s): "POCBNP" CBG: Recent Labs  Lab 04/10/23 1338 04/10/23 1747 04/10/23 2207 04/11/23 0808 04/11/23 1059  GLUCAP 237* 190* 247* 243* 264*   D-Dimer No results for input(s): "DDIMER" in the last 72 hours. Hgb A1c Recent Labs    04/09/23 0741  HGBA1C 12.0*   Lipid Profile No results for input(s): "CHOL", "HDL", "LDLCALC", "TRIG", "CHOLHDL", "LDLDIRECT" in the last 72 hours. Thyroid function studies No results for input(s): "TSH", "T4TOTAL", "T3FREE", "THYROIDAB" in the last 72 hours.  Invalid input(s): "FREET3" Anemia work up No results for input(s): "VITAMINB12", "FOLATE", "FERRITIN", "TIBC", "IRON", "RETICCTPCT" in the last 72 hours. Urinalysis    Component Value Date/Time   COLORURINE YELLOW (A) 09/03/2022 0916   APPEARANCEUR CLEAR (A) 09/03/2022 0916   LABSPEC 1.025 09/03/2022 0916   PHURINE 5.0 09/03/2022 0916   GLUCOSEU >=500 (A) 09/03/2022 0916   HGBUR NEGATIVE 09/03/2022 0916   BILIRUBINUR NEGATIVE 09/03/2022 0916   KETONESUR NEGATIVE 09/03/2022 0916   PROTEINUR NEGATIVE 09/03/2022 0916   NITRITE NEGATIVE 09/03/2022 0916   LEUKOCYTESUR  NEGATIVE 09/03/2022 0916   Sepsis Labs Recent Labs  Lab 04/09/23 0121 04/10/23 0812 04/11/23 0510  WBC 9.7 11.4* 8.8   Microbiology No results found for this or any previous visit (from the past 240 hours). Imaging ECHOCARDIOGRAM COMPLETE Result Date: 04/09/2023    ECHOCARDIOGRAM REPORT   Patient Name:   CORBAN KISTLER Toenjes Date of Exam: 04/09/2023 Medical Rec #:  [id number]    Height:       69.0 in Accession #:    [id number]   Weight:       168.0 lb Date of Birth:  Dec 19, 1965    BSA:          1.918 m Patient Age:    57 years     BP:           100/88 mmHg Patient Gender: M            HR:           100 bpm. Exam Location:  [facility] Procedure: 2D Echo, Cardiac Doppler, Color Doppler, Strain Analysis and 3D Echo            (Both Spectral and Color Flow Doppler were utilized during            procedure). Indications:     CHF-acute systolic I50.21  History:         Patient has prior history of Echocardiogram examinations, most                  recent 09/04/2022. CAD, Stroke; Risk Factors:Diabetes and  Hypertension.  Sonographer:     Cristela Blue Referring Phys:  [id number] VIPUL [facility] Diagnosing Phys: Windell Norfolk  Sonographer Comments: Global longitudinal strain was attempted. IMPRESSIONS  1. Large LV thrombus noted. Left ventricular ejection fraction, by estimation, is 10-15%%. The left ventricle has severely decreased function. The left ventricle demonstrates regional wall motion abnormalities (see scoring diagram/findings for description). The left ventricular internal cavity size was severely dilated. Left ventricular diastolic parameters are consistent with Grade II diastolic dysfunction (pseudonormalization).  2. Right ventricular systolic function is mildly reduced. The right ventricular size is mildly enlarged. There is moderately elevated pulmonary artery systolic pressure. The estimated right ventricular systolic pressure is 45.4 mmHg.  3. Left atrial size was mildly dilated.  4. The mitral valve is  normal in structure. Mild mitral valve regurgitation.  5. The aortic valve is tricuspid. There is mild thickening of the aortic valve. Aortic valve regurgitation is trivial. FINDINGS  Left Ventricle: Large LV thrombus noted. Left ventricular ejection fraction, by estimation, is 10-15%%. The left ventricle has severely decreased function. The left ventricle demonstrates regional wall motion abnormalities. The left ventricular internal  cavity size was severely dilated. There is no left ventricular hypertrophy. Left ventricular diastolic parameters are consistent with Grade II diastolic dysfunction (pseudonormalization).  LV Wall Scoring: The mid and distal anterior septum, entire apex, entire inferior wall, and mid inferoseptal segment are akinetic. The anterior wall, antero-lateral wall, posterior wall, basal anteroseptal segment, and basal inferoseptal segment are hypokinetic. Right Ventricle: The right ventricular size is mildly enlarged. No increase in right ventricular wall thickness. Right ventricular systolic function is mildly reduced. There is moderately elevated pulmonary artery systolic pressure. The tricuspid regurgitant velocity is 3.18 m/s, and with an assumed right atrial pressure of 5 mmHg, the estimated right ventricular systolic pressure is 45.4 mmHg. Left Atrium: Left atrial size was mildly dilated. Right Atrium: Right atrial size was normal in size. Pericardium: There is no evidence of pericardial effusion. Mitral Valve: The mitral valve is normal in structure. Mild mitral valve regurgitation. Tricuspid Valve: The tricuspid valve is normal in structure. Tricuspid valve regurgitation is mild. Aortic Valve: The aortic valve is tricuspid. There is mild thickening of the aortic valve. Aortic valve regurgitation is trivial. Aortic valve mean gradient measures 1.0 mmHg. Aortic valve peak gradient measures 1.5 mmHg. Aortic valve area, by VTI measures 3.71 cm. Pulmonic Valve: The pulmonic valve was not  well visualized. Pulmonic valve regurgitation is trivial. Aorta: The aortic root is normal in size and structure. IAS/Shunts: The interatrial septum was not well visualized.  LEFT VENTRICLE PLAX 2D LVIDd:         6.70 cm      Diastology LVIDs:         6.50 cm      LV e' medial:    4.68 cm/s LV PW:         1.10 cm      LV E/e' medial:  15.3 LV IVS:        0.80 cm      LV e' lateral:   4.57 cm/s LVOT diam:     2.20 cm      LV E/e' lateral: 15.7 LV SV:         26 LV SV Index:   14 LVOT Area:     3.80 cm  LV Volumes (MOD) LV vol d, MOD A2C: 202.0 ml LV vol d, MOD A4C: 279.0 ml LV vol s, MOD A2C: 189.0 ml LV vol s, MOD  A4C: 259.0 ml LV SV MOD A2C:     13.0 ml LV SV MOD A4C:     279.0 ml LV SV MOD BP:      12.8 ml RIGHT VENTRICLE RV Basal diam:  4.60 cm RV Mid diam:    3.20 cm RV S prime:     8.16 cm/s TAPSE (M-mode): 1.3 cm LEFT ATRIUM             Index        RIGHT ATRIUM           Index LA diam:        4.40 cm 2.29 cm/m   RA Area:     16.30 cm LA Vol (A2C):   62.5 ml 32.58 ml/m  RA Volume:   45.60 ml  23.77 ml/m LA Vol (A4C):   36.3 ml 18.92 ml/m LA Biplane Vol: 49.7 ml 25.91 ml/m  AORTIC VALVE AV Area (Vmax):    3.38 cm AV Area (Vmean):   3.23 cm AV Area (VTI):     3.71 cm AV Vmax:           61.00 cm/s AV Vmean:          39.800 cm/s AV VTI:            0.071 m AV Peak Grad:      1.5 mmHg AV Mean Grad:      1.0 mmHg LVOT Vmax:         54.30 cm/s LVOT Vmean:        33.800 cm/s LVOT VTI:          0.070 m LVOT/AV VTI ratio: 0.98  AORTA Ao Root diam: 3.30 cm MITRAL VALVE               TRICUSPID VALVE MV Area (PHT): 6.83 cm    TR Peak grad:   40.4 mmHg MV Decel Time: 111 msec    TR Vmax:        318.00 cm/s MV E velocity: 71.80 cm/s MV A velocity: 43.50 cm/s  SHUNTS MV E/A ratio:  1.65        Systemic VTI:  0.07 m                            Systemic Diam: 2.20 cm Windell Norfolk Electronically signed by Windell Norfolk Signature Date/Time: 04/09/2023/5:46:27 PM    Final    DG Chest Port 1 View Result Date:  04/09/2023 CLINICAL DATA:  Chest pain EXAM: PORTABLE CHEST 1 VIEW COMPARISON:  03/25/2023 FINDINGS: Cardiomegaly. No confluent airspace opacities, effusions or edema. No acute bony abnormality. IMPRESSION: Cardiomegaly.  No active disease. Electronically Signed   By: Charlett Nose M.D.   On: 04/09/2023 01:36      Time coordinating discharge: over 30 minutes  SIGNED:  Sunnie Nielsen DO [facility]

## 2023-04-11 NOTE — Progress Notes (Signed)
 PHARMACY - ANTICOAGULATION CONSULT NOTE  Pharmacy Consult for heparin infusion and warfarin Indication: Large LV thrombus   No Known Allergies  Patient Measurements: Height: 5\' 9"  (175.3 cm) Weight: 75.1 kg (165 lb 9.1 oz) IBW/kg (Calculated) : 70.7 Heparin Dosing Weight: 76.2 kg  Vital Signs: Temp: 98.3 F (36.8 C) (03/20 1528) Temp Source: Oral (03/20 1218) BP: 101/79 (03/20 1528) Pulse Rate: 95 (03/20 1528)  Labs: Recent Labs    04/09/23 0121 04/09/23 0307 04/09/23 1924 04/10/23 0159 04/10/23 0812 04/11/23 0510 04/11/23 0944 04/11/23 0958  HGB 15.4  --   --   --  15.0 14.5 15.0 15.0  HCT 46.5  --   --   --  43.1 42.4 44.0 44.0  PLT 215  --   --   --  212 215  --   --   APTT  --   --  32  --   --   --   --   --   LABPROT  --   --  14.7  --   --  15.5*  --   --   INR  --   --  1.1  --   --  1.2  --   --   HEPARINUNFRC  --   --   --  0.47 0.50 0.42  --   --   CREATININE 1.17  --   --  1.26*  --  1.07  --   --   TROPONINIHS 22* 21*  --   --   --   --   --   --    Estimated Creatinine Clearance: 75.3 mL/min (by C-G formula based on SCr of 1.07 mg/dL).  Medical History: Past Medical History:  Diagnosis Date   Coronary artery disease    Diabetes mellitus without complication (HCC)    GERD (gastroesophageal reflux disease)    Hypertension    Stroke (cerebrum) (HCC)     Medications:  Scheduled:   aspirin EC  81 mg Oral Daily   atorvastatin  80 mg Oral q1800   busPIRone  5 mg Oral BID   digoxin  0.125 mg Oral Daily   insulin aspart  0-5 Units Subcutaneous QHS   insulin aspart  0-9 Units Subcutaneous TID WC   isosorbide mononitrate  30 mg Oral Daily   losartan  12.5 mg Oral Daily   melatonin  2.5 mg Oral QHS   metoprolol succinate  12.5 mg Oral Daily   mometasone-formoterol  2 puff Inhalation BID   nicotine  21 mg Transdermal Daily   pantoprazole  40 mg Oral Daily   spironolactone  12.5 mg Oral Daily   Assessment: 58 y.o. male with CAD s/p PCI in 08/2022,  hypertension, GERD, type 2 diabetes, hyperlipidemia, and tobacco use who presented with worsening shortness of breath over several months. Large LV thrombus noted on ECHO. No chronic anticoagulation noted based on dispense record  Baseline Labs: H&H/PLT wnl, INR/aPTT   Results: Date Time HL INR Comments  3/19 0159 HL=0.47  Heparin therapeutic x 1, INR 1.1 at baseline  3/19 0812 HL=0.50 1.1 Heparin therapeutic x 2  3/20 0510 0.42 1.2 Heparin therapeutic x 3  3/20    Resume after cathlab - transfer to [facility] for urgent LVAD workup    Plan:  Resume heparin at previously therapeutic rate 1350 units/hour  Will order HL 6hrs after heparin infusion resumed Continue to monitor CBC daily while on heparin infusion.  Etrulia Zarr Rodriguez-Guzman PharmD, BCPS 04/11/2023 3:58  PM

## 2023-04-11 NOTE — Progress Notes (Signed)
 [facility] CLINIC CARDIOLOGY PROGRESS NOTE       Patient ID: Kenneth [name] MRN: [id number] DOB/AGE: 59-Aug-1967 59 y.o.  Admit date: 04/09/2023 Referring Physician Dr. Delfino Lovett Primary Physician Sparks, Duane Lope, MD  Primary Cardiologist Dr. Melton Alar Reason for Consultation AoCHF, elevated troponins  HPI: JEX STRAUSBAUGH is a 58 y.o. male  with a past medical history of CAD s/p remote PCI to RCA and PCI to OM1 in 2024, chronic HFrEF uncontrolled DM2, HLD, HTN, hx CVA, ongoing tobacco use who presented to the ED on 04/09/2023 for chest pain, shortness of breath. Cardiology was consulted for further evaluation.   Interval history: -Patient seen and examined this morning, resting comfortably in bed nearly flat.  -No complaints this AM. Did have some NSVT yesterday evening. -BP and heart rate remain controlled.   Review of systems complete and found to be negative unless listed above    Past Medical History:  Diagnosis Date   Coronary artery disease    Diabetes mellitus without complication (HCC)    GERD (gastroesophageal reflux disease)    Hypertension    Stroke (cerebrum) (HCC)     Past Surgical History:  Procedure Laterality Date   CORONARY ANGIOPLASTY WITH STENT PLACEMENT  06/18/2008   CORONARY ANGIOPLASTY WITH STENT PLACEMENT     CORONARY STENT INTERVENTION N/A 09/04/2022   Procedure: CORONARY STENT INTERVENTION;  Surgeon: Alwyn Pea, MD;  Location: [facility] INVASIVE CV LAB;  Service: Cardiovascular;  Laterality: N/A;   LEFT HEART CATH AND CORONARY ANGIOGRAPHY N/A 09/04/2022   Procedure: LEFT HEART CATH AND CORONARY ANGIOGRAPHY;  Surgeon: Alwyn Pea, MD;  Location: [facility] INVASIVE CV LAB;  Service: Cardiovascular;  Laterality: N/A;   SHOULDER ARTHROSCOPY WITH ROTATOR CUFF REPAIR AND SUBACROMIAL DECOMPRESSION Right 04/19/2017   Procedure: SHOULDER ARTHROSCOPY WITH ROTATOR CUFF REPAIR AND SUBACROMIAL DECOMPRESSION;  Surgeon: Signa Kell, MD;  Location: [facility] ORS;  Service:  Orthopedics;  Laterality: Right;  distal clavical excision, bicepts tenotomy    Medications Prior to Admission  Medication Sig Dispense Refill Last Dose/Taking   albuterol (PROVENTIL) (2.5 MG/3ML) 0.083% nebulizer solution Take 2.5 mg by nebulization every 6 (six) hours as needed for wheezing.   Taking As Needed   albuterol (VENTOLIN HFA) 108 (90 Base) MCG/ACT inhaler Inhale 2 puffs into the lungs every 4 (four) hours as needed for wheezing.   Taking As Needed   budesonide-formoterol (SYMBICORT) 160-4.5 MCG/ACT inhaler Inhale 2 puffs into the lungs 2 (two) times daily.   Past Week   busPIRone (BUSPAR) 5 MG tablet Take 1 tablet by mouth 2 (two) times daily.   04/08/2023   ipratropium-albuterol (DUONEB) 0.5-2.5 (3) MG/3ML SOLN Take 3 mLs by nebulization every 4 (four) hours as needed. 360 mL 0 Taking As Needed   nystatin (MYCOSTATIN) 100000 UNIT/ML suspension Take 5 mLs by mouth 3 (three) times daily. X 10 days   04/08/2023   omeprazole (PRILOSEC) 20 MG capsule Take 20 mg by mouth daily.   Taking   atorvastatin (LIPITOR) 80 MG tablet Take 1 tablet (80 mg total) by mouth daily at 6 PM. 90 tablet 0    buPROPion (WELLBUTRIN SR) 100 MG 12 hr tablet Take 100 mg by mouth 2 (two) times daily.       glimepiride (AMARYL) 4 MG tablet Take 1 tablet (4 mg total) by mouth daily with breakfast. (Patient not taking: Reported on 04/09/2023) 30 tablet 0 Not Taking   lisinopril (ZESTRIL) 40 MG tablet Take 1 tablet (40 mg total) by mouth  daily. 90 tablet 0    metFORMIN (GLUCOPHAGE) 1000 MG tablet TAKE 1 TABLET BY MOUTH TWICE DAILY WITH MEALS (Patient not taking: Reported on 04/09/2023) 180 tablet 0 Not Taking   metoprolol succinate (TOPROL-XL) 25 MG 24 hr tablet Take 0.5 tablets (12.5 mg total) by mouth daily. 45 tablet 0    nicotine (NICODERM CQ - DOSED IN MG/24 HOURS) 21 mg/24hr patch Place 1 patch (21 mg total) onto the skin daily. (Patient not taking: Reported on 04/09/2023) 28 patch 0 Not Taking   predniSONE (STERAPRED  UNI-PAK 21 TAB) 10 MG (21) TBPK tablet As directed on packaging (Patient not taking: Reported on 04/09/2023) 1 each 0 Not Taking   Spacer/Aero-Holding Chambers (AEROCHAMBER MV) inhaler Use as instructed 1 each 0    spironolactone (ALDACTONE) 25 MG tablet Take 0.5 tablets (12.5 mg total) by mouth daily. 45 tablet 0    Social History   Socioeconomic History   Marital status: Married    Spouse name: Not on file   Number of children: Not on file   Years of education: Not on file   Highest education level: Not on file  Occupational History   Not on file  Tobacco Use   Smoking status: Heavy Smoker    Current packs/day: 1.50    Types: Cigarettes   Smokeless tobacco: Current    Types: Chew   Tobacco comments:    on his own terms  Vaping Use   Vaping status: Never Used  Substance and Sexual Activity   Alcohol use: No    Alcohol/week: 0.0 standard drinks of alcohol    Comment: OCCASIONALLY   Drug use: No   Sexual activity: Not on file  Other Topics Concern   Not on file  Social History Narrative   Not on file   Social Drivers of Health   Financial Resource Strain: Low Risk  (02/18/2023)   Received from [facility]   Overall Financial Resource Strain (CARDIA)    Difficulty of Paying Living Expenses: Not hard at all  Food Insecurity: No Food Insecurity (04/09/2023)   Hunger Vital Sign    Worried About Running Out of Food in the Last Year: Never true    Ran Out of Food in the Last Year: Never true  Transportation Needs: No Transportation Needs (04/09/2023)   PRAPARE - Administrator, Civil Service (Medical): No    Lack of Transportation (Non-Medical): No  Physical Activity: Not on file  Stress: Not on file  Social Connections: Unknown (04/09/2023)   Social Connection and Isolation Panel [NHANES]    Frequency of Communication with Friends and Family: Never    Frequency of Social Gatherings with Friends and Family: Not on file    Attends Religious  Services: Never    Active Member of Clubs or Organizations: No    Attends Banker Meetings: Never    Marital Status: Married  Catering manager Violence: Not At Risk (04/09/2023)   Humiliation, Afraid, Rape, and Kick questionnaire    Fear of Current or Ex-Partner: No    Emotionally Abused: No    Physically Abused: No    Sexually Abused: No    Family History  Problem Relation Age of Onset   Heart attack Mother    Heart failure Mother    Coronary artery disease Father    Diabetes Father      Vitals:   04/10/23 2314 04/11/23 0138 04/11/23 0524 04/11/23 0804  BP: 101/67  101/80 108/80  Pulse: 94  89 94  Resp: 19  20   Temp: 98.6 F (37 C)  97.6 F (36.4 C) 98.5 F (36.9 C)  TempSrc:      SpO2: 97%  100% 96%  Weight:  75.1 kg    Height:        PHYSICAL EXAM General: Chronically ill appearing male, well nourished, in no acute distress. HEENT: Normocephalic and atraumatic. Neck: No JVD.  Lungs: Normal respiratory effort on room air. Clear bilaterally to auscultation. No wheezes, crackles, rhonchi.  Heart: HRRR. Normal S1 and S2 without gallops or murmurs.  Abdomen: Non-distended appearing.  Msk: Normal strength and tone for age. Extremities: Warm and well perfused. No clubbing, cyanosis. No edema.  Neuro: Alert and oriented X 3. Psych: Answers questions appropriately.   Labs: Basic Metabolic Panel: Recent Labs    04/10/23 0159 04/11/23 0510  NA 136 137  K 3.8 3.6  CL 102 101  CO2 26 27  GLUCOSE 114* 299*  BUN 25* 22*  CREATININE 1.26* 1.07  CALCIUM 8.7* 8.4*   Liver Function Tests: Recent Labs    04/09/23 0121  AST 29  ALT 40  ALKPHOS 90  BILITOT 1.9*  PROT 6.0*  ALBUMIN 3.0*   No results for input(s): "LIPASE", "AMYLASE" in the last 72 hours. CBC: Recent Labs    04/09/23 0121 04/10/23 0812 04/11/23 0510  WBC 9.7 11.4* 8.8  NEUTROABS 7.3  --   --   HGB 15.4 15.0 14.5  HCT 46.5 43.1 42.4  MCV 88.2 85.3 84.6  PLT 215 212 215    Cardiac Enzymes: Recent Labs    04/09/23 0121 04/09/23 0307  TROPONINIHS 22* 21*   BNP: Recent Labs    04/09/23 0121  BNP 1,185.6*   D-Dimer: No results for input(s): "DDIMER" in the last 72 hours. Hemoglobin A1C: Recent Labs    04/09/23 0741  HGBA1C 12.0*   Fasting Lipid Panel: No results for input(s): "CHOL", "HDL", "LDLCALC", "TRIG", "CHOLHDL", "LDLDIRECT" in the last 72 hours. Thyroid Function Tests: No results for input(s): "TSH", "T4TOTAL", "T3FREE", "THYROIDAB" in the last 72 hours.  Invalid input(s): "FREET3" Anemia Panel: No results for input(s): "VITAMINB12", "FOLATE", "FERRITIN", "TIBC", "IRON", "RETICCTPCT" in the last 72 hours.   Radiology: ECHOCARDIOGRAM COMPLETE Result Date: 04/09/2023    ECHOCARDIOGRAM REPORT   Patient Name:   MERICK KELLEHER Yanda Date of Exam: 04/09/2023 Medical Rec #:  [id number]    Height:       69.0 in Accession #:    [id number]   Weight:       168.0 lb Date of Birth:  Mar 21, 1965    BSA:          1.918 m Patient Age:    57 years     BP:           100/88 mmHg Patient Gender: M            HR:           100 bpm. Exam Location:  [facility] Procedure: 2D Echo, Cardiac Doppler, Color Doppler, Strain Analysis and 3D Echo            (Both Spectral and Color Flow Doppler were utilized during            procedure). Indications:     CHF-acute systolic I50.21  History:         Patient has prior history of Echocardiogram examinations, most  recent 09/04/2022. CAD, Stroke; Risk Factors:Diabetes and                  Hypertension.  Sonographer:     Cristela Blue Referring Phys:  [id number] [facility] Diagnosing Phys: Windell Norfolk  Sonographer Comments: Global longitudinal strain was attempted. IMPRESSIONS  1. Large LV thrombus noted. Left ventricular ejection fraction, by estimation, is 10-15%%. The left ventricle has severely decreased function. The left ventricle demonstrates regional wall motion abnormalities (see scoring diagram/findings for description). The  left ventricular internal cavity size was severely dilated. Left ventricular diastolic parameters are consistent with Grade II diastolic dysfunction (pseudonormalization).  2. Right ventricular systolic function is mildly reduced. The right ventricular size is mildly enlarged. There is moderately elevated pulmonary artery systolic pressure. The estimated right ventricular systolic pressure is 45.4 mmHg.  3. Left atrial size was mildly dilated.  4. The mitral valve is normal in structure. Mild mitral valve regurgitation.  5. The aortic valve is tricuspid. There is mild thickening of the aortic valve. Aortic valve regurgitation is trivial. FINDINGS  Left Ventricle: Large LV thrombus noted. Left ventricular ejection fraction, by estimation, is 10-15%%. The left ventricle has severely decreased function. The left ventricle demonstrates regional wall motion abnormalities. The left ventricular internal  cavity size was severely dilated. There is no left ventricular hypertrophy. Left ventricular diastolic parameters are consistent with Grade II diastolic dysfunction (pseudonormalization).  LV Wall Scoring: The mid and distal anterior septum, entire apex, entire inferior wall, and mid inferoseptal segment are akinetic. The anterior wall, antero-lateral wall, posterior wall, basal anteroseptal segment, and basal inferoseptal segment are hypokinetic. Right Ventricle: The right ventricular size is mildly enlarged. No increase in right ventricular wall thickness. Right ventricular systolic function is mildly reduced. There is moderately elevated pulmonary artery systolic pressure. The tricuspid regurgitant velocity is 3.18 m/s, and with an assumed right atrial pressure of 5 mmHg, the estimated right ventricular systolic pressure is 45.4 mmHg. Left Atrium: Left atrial size was mildly dilated. Right Atrium: Right atrial size was normal in size. Pericardium: There is no evidence of pericardial effusion. Mitral Valve: The mitral  valve is normal in structure. Mild mitral valve regurgitation. Tricuspid Valve: The tricuspid valve is normal in structure. Tricuspid valve regurgitation is mild. Aortic Valve: The aortic valve is tricuspid. There is mild thickening of the aortic valve. Aortic valve regurgitation is trivial. Aortic valve mean gradient measures 1.0 mmHg. Aortic valve peak gradient measures 1.5 mmHg. Aortic valve area, by VTI measures 3.71 cm. Pulmonic Valve: The pulmonic valve was not well visualized. Pulmonic valve regurgitation is trivial. Aorta: The aortic root is normal in size and structure. IAS/Shunts: The interatrial septum was not well visualized.  LEFT VENTRICLE PLAX 2D LVIDd:         6.70 cm      Diastology LVIDs:         6.50 cm      LV e' medial:    4.68 cm/s LV PW:         1.10 cm      LV E/e' medial:  15.3 LV IVS:        0.80 cm      LV e' lateral:   4.57 cm/s LVOT diam:     2.20 cm      LV E/e' lateral: 15.7 LV SV:         26 LV SV Index:   14 LVOT Area:     3.80 cm  LV Volumes (MOD) LV  vol d, MOD A2C: 202.0 ml LV vol d, MOD A4C: 279.0 ml LV vol s, MOD A2C: 189.0 ml LV vol s, MOD A4C: 259.0 ml LV SV MOD A2C:     13.0 ml LV SV MOD A4C:     279.0 ml LV SV MOD BP:      12.8 ml RIGHT VENTRICLE RV Basal diam:  4.60 cm RV Mid diam:    3.20 cm RV S prime:     8.16 cm/s TAPSE (M-mode): 1.3 cm LEFT ATRIUM             Index        RIGHT ATRIUM           Index LA diam:        4.40 cm 2.29 cm/m   RA Area:     16.30 cm LA Vol (A2C):   62.5 ml 32.58 ml/m  RA Volume:   45.60 ml  23.77 ml/m LA Vol (A4C):   36.3 ml 18.92 ml/m LA Biplane Vol: 49.7 ml 25.91 ml/m  AORTIC VALVE AV Area (Vmax):    3.38 cm AV Area (Vmean):   3.23 cm AV Area (VTI):     3.71 cm AV Vmax:           61.00 cm/s AV Vmean:          39.800 cm/s AV VTI:            0.071 m AV Peak Grad:      1.5 mmHg AV Mean Grad:      1.0 mmHg LVOT Vmax:         54.30 cm/s LVOT Vmean:        33.800 cm/s LVOT VTI:          0.070 m LVOT/AV VTI ratio: 0.98  AORTA Ao Root diam:  3.30 cm MITRAL VALVE               TRICUSPID VALVE MV Area (PHT): 6.83 cm    TR Peak grad:   40.4 mmHg MV Decel Time: 111 msec    TR Vmax:        318.00 cm/s MV E velocity: 71.80 cm/s MV A velocity: 43.50 cm/s  SHUNTS MV E/A ratio:  1.65        Systemic VTI:  0.07 m                            Systemic Diam: 2.20 cm Windell Norfolk Electronically signed by Windell Norfolk Signature Date/Time: 04/09/2023/5:46:27 PM    Final    DG Chest Port 1 View Result Date: 04/09/2023 CLINICAL DATA:  Chest pain EXAM: PORTABLE CHEST 1 VIEW COMPARISON:  03/25/2023 FINDINGS: Cardiomegaly. No confluent airspace opacities, effusions or edema. No acute bony abnormality. IMPRESSION: Cardiomegaly.  No active disease. Electronically Signed   By: Charlett Nose M.D.   On: 04/09/2023 01:36   DG Chest 2 View Result Date: 03/25/2023 CLINICAL DATA:  Shortness of breath.  Weakness. EXAM: CHEST - 2 VIEW COMPARISON:  09/03/2022 FINDINGS: The heart size and mediastinal contours are within normal limits. Both lungs are clear. The visualized skeletal structures are unremarkable. IMPRESSION: No active cardiopulmonary disease. Electronically Signed   By: Danae Orleans M.D.   On: 03/25/2023 13:28    ECHO as above  TELEMETRY reviewed by me 04/11/2023: sinus rhythm LBBB PVCs rate 90s  EKG reviewed by me: Sinus rhythm LBBB rate 96 bpm  Data reviewed by me  04/11/2023: last 24h vitals tele labs imaging I/O hospitalist progress note, heart failure pharmacy notes  Principal Problem:   Precordial chest pain Active Problems:   CHF (congestive heart failure) (HCC)    ASSESSMENT AND PLAN:  DEONTRAY HUNNICUTT is a 58 y.o. male  with a past medical history of CAD s/p remote PCI to RCA and PCI to OM1 in 2024, chronic HFrEF uncontrolled DM2, HLD, HTN, hx CVA, ongoing tobacco use who presented to the ED on 04/09/2023 for chest pain, shortness of breath. Cardiology was consulted for further evaluation.   # Acute on chronic HFrEF # LV apical thrombus #  Coronary artery disease # Chronic LBBB Longstanding history of cardiomyopathy dating back to 2017 (limited echo from 02/2017 showed EF 45-50%, from 12/2015 EF was 30-35% with mild LV dilation), previously on GDMT but history of noncompliance.  Echo last admission with EF down to 20-25%.  BNP this admission 1100, JVD on exam.  Repeat echo with EF 10-15%, severely reduced LV function, mildly reduced RV function with moderately elevated PASP.  Large LV thrombus noted. -Plan for RHC this morning. Patient aware of plan and amenable to proceeding. Written consent to be obtained. -Will transition to warfarin per pharmacy after RHC this AM.  -Diuretic plan pending RHC results. Appreciate advanced heart failure recs.  -Continue atorvastatin 80 mg daily and aspirin 81 mg daily. -GDMT with metoprolol XL 12.5 mg daily, spironolactone 12.5 mg daily, losartan 12.5 mg today.  Given significantly elevated A1c will defer SGLT2 at this time. -Minimally elevated and flat trending troponin most consistent with demand/supply mismatch and not ACS  -Will need outpatient heart failure follow-up -patient was severely reduced EF and now subsequent LV apical thrombus.  Will need evaluation outpatient for advanced therapies. Appointment scheduled. -Stressed the importance of medication compliance.  # Tobacco abuse Strongly encouraged complete cessation, nicotine patches.   This patient's plan of care was discussed and created with Dr. Darrold Junker and he is in agreement.  Signed: Gale Journey, PA-C  04/11/2023, 8:40 AM [facility] Cardiology

## 2023-04-12 ENCOUNTER — Encounter (HOSPITAL_COMMUNITY): Payer: Self-pay

## 2023-04-12 ENCOUNTER — Inpatient Hospital Stay (HOSPITAL_COMMUNITY)
Admission: AD | Admit: 2023-04-12 | Discharge: 2023-04-25 | DRG: 291 | Disposition: A | Discharge status: Home / Self Care | Payer: Self-pay | Source: Other Acute Inpatient Hospital | Attending: Cardiology | Admitting: Cardiology

## 2023-04-12 ENCOUNTER — Inpatient Hospital Stay (HOSPITAL_COMMUNITY): Payer: Self-pay

## 2023-04-12 ENCOUNTER — Other Ambulatory Visit: Payer: Self-pay

## 2023-04-12 DIAGNOSIS — E440 Moderate protein-calorie malnutrition: Secondary | ICD-10-CM | POA: Diagnosis present

## 2023-04-12 DIAGNOSIS — I493 Ventricular premature depolarization: Secondary | ICD-10-CM | POA: Diagnosis present

## 2023-04-12 DIAGNOSIS — I5023 Acute on chronic systolic (congestive) heart failure: Secondary | ICD-10-CM | POA: Diagnosis present

## 2023-04-12 DIAGNOSIS — E1165 Type 2 diabetes mellitus with hyperglycemia: Secondary | ICD-10-CM | POA: Diagnosis present

## 2023-04-12 DIAGNOSIS — I428 Other cardiomyopathies: Secondary | ICD-10-CM | POA: Diagnosis present

## 2023-04-12 DIAGNOSIS — Z79899 Other long term (current) drug therapy: Secondary | ICD-10-CM

## 2023-04-12 DIAGNOSIS — Z7982 Long term (current) use of aspirin: Secondary | ICD-10-CM

## 2023-04-12 DIAGNOSIS — I110 Hypertensive heart disease with heart failure: Principal | ICD-10-CM | POA: Diagnosis present

## 2023-04-12 DIAGNOSIS — I447 Left bundle-branch block, unspecified: Secondary | ICD-10-CM | POA: Diagnosis present

## 2023-04-12 DIAGNOSIS — Z8673 Personal history of transient ischemic attack (TIA), and cerebral infarction without residual deficits: Secondary | ICD-10-CM

## 2023-04-12 DIAGNOSIS — Z5971 Insufficient health insurance coverage: Secondary | ICD-10-CM

## 2023-04-12 DIAGNOSIS — I252 Old myocardial infarction: Secondary | ICD-10-CM

## 2023-04-12 DIAGNOSIS — F17210 Nicotine dependence, cigarettes, uncomplicated: Secondary | ICD-10-CM | POA: Diagnosis present

## 2023-04-12 DIAGNOSIS — Z833 Family history of diabetes mellitus: Secondary | ICD-10-CM

## 2023-04-12 DIAGNOSIS — I255 Ischemic cardiomyopathy: Secondary | ICD-10-CM | POA: Diagnosis present

## 2023-04-12 DIAGNOSIS — Z8249 Family history of ischemic heart disease and other diseases of the circulatory system: Secondary | ICD-10-CM

## 2023-04-12 DIAGNOSIS — E785 Hyperlipidemia, unspecified: Secondary | ICD-10-CM | POA: Diagnosis present

## 2023-04-12 DIAGNOSIS — Z6823 Body mass index (BMI) 23.0-23.9, adult: Secondary | ICD-10-CM

## 2023-04-12 DIAGNOSIS — Z7901 Long term (current) use of anticoagulants: Secondary | ICD-10-CM

## 2023-04-12 DIAGNOSIS — Z794 Long term (current) use of insulin: Secondary | ICD-10-CM

## 2023-04-12 DIAGNOSIS — I5021 Acute systolic (congestive) heart failure: Principal | ICD-10-CM | POA: Diagnosis present

## 2023-04-12 DIAGNOSIS — Z7984 Long term (current) use of oral hypoglycemic drugs: Secondary | ICD-10-CM

## 2023-04-12 DIAGNOSIS — Z7951 Long term (current) use of inhaled steroids: Secondary | ICD-10-CM

## 2023-04-12 DIAGNOSIS — K219 Gastro-esophageal reflux disease without esophagitis: Secondary | ICD-10-CM | POA: Diagnosis present

## 2023-04-12 DIAGNOSIS — I2510 Atherosclerotic heart disease of native coronary artery without angina pectoris: Secondary | ICD-10-CM | POA: Diagnosis present

## 2023-04-12 DIAGNOSIS — I5084 End stage heart failure: Secondary | ICD-10-CM | POA: Diagnosis present

## 2023-04-12 DIAGNOSIS — R5381 Other malaise: Secondary | ICD-10-CM | POA: Diagnosis present

## 2023-04-12 DIAGNOSIS — Z955 Presence of coronary angioplasty implant and graft: Secondary | ICD-10-CM

## 2023-04-12 DIAGNOSIS — I472 Ventricular tachycardia, unspecified: Secondary | ICD-10-CM | POA: Diagnosis present

## 2023-04-12 LAB — BASIC METABOLIC PANEL
Anion gap: 11 (ref 5–15)
BUN: 21 mg/dL — ABNORMAL HIGH (ref 6–20)
CO2: 21 mmol/L — ABNORMAL LOW (ref 22–32)
Calcium: 8.5 mg/dL — ABNORMAL LOW (ref 8.9–10.3)
Chloride: 104 mmol/L (ref 98–111)
Creatinine, Ser: 0.94 mg/dL (ref 0.61–1.24)
GFR, Estimated: >60 mL/min (ref >60)
Glucose, Bld: 336 mg/dL — ABNORMAL HIGH (ref 70–99)
Potassium: 4.2 mmol/L (ref 3.5–5.1)
Sodium: 136 mmol/L (ref 135–145)

## 2023-04-12 LAB — BLOOD GAS, ARTERIAL
Acid-Base Excess: 1.2 mmol/L (ref 0.0–2.0)
Bicarbonate: 22.6 mmol/L (ref 20.0–28.0)
O2 Saturation: 99.7 %
Patient temperature: 37.1
pCO2 arterial: 27 mmHg — ABNORMAL LOW (ref 32–48)
pH, Arterial: 7.53 — ABNORMAL HIGH (ref 7.35–7.45)
pO2, Arterial: 93 mmHg (ref 83–108)

## 2023-04-12 LAB — PROTIME-INR
INR: 1.2 (ref 0.8–1.2)
INR: 1.1 (ref 0.8–1.2)
Prothrombin Time: 15 s (ref 11.4–15.2)
Prothrombin Time: 14.4 s (ref 11.4–15.2)

## 2023-04-12 LAB — CBC
HCT: 43.8 % (ref 39.0–52.0)
Hemoglobin: 14.6 g/dL (ref 13.0–17.0)
MCH: 29.1 pg (ref 26.0–34.0)
MCHC: 33.3 g/dL (ref 30.0–36.0)
MCV: 87.4 fL (ref 80.0–100.0)
Platelets: 217 10*3/uL (ref 150–400)
RBC: 5.01 MIL/uL (ref 4.22–5.81)
RDW: 15.3 % (ref 11.5–15.5)
WBC: 7.9 10*3/uL (ref 4.0–10.5)
nRBC: 0 % (ref 0.0–0.2)

## 2023-04-12 LAB — HEPATITIS B SURFACE ANTIBODY,QUALITATIVE: Hep B S Ab: NONREACTIVE

## 2023-04-12 LAB — COOXEMETRY PANEL
Carboxyhemoglobin: 1.3 % (ref 0.5–1.5)
Methemoglobin: 0.7 % (ref 0.0–1.5)
O2 Saturation: 72.2 %
Total hemoglobin: 15.3 g/dL (ref 12.0–16.0)

## 2023-04-12 LAB — TYPE AND SCREEN
ABO/RH(D): O POS
Antibody Screen: NEGATIVE

## 2023-04-12 LAB — LACTATE DEHYDROGENASE: LDH: 158 U/L (ref 98–192)

## 2023-04-12 LAB — GLUCOSE, CAPILLARY
Glucose-Capillary: 299 mg/dL — ABNORMAL HIGH (ref 70–99)
Glucose-Capillary: 319 mg/dL — ABNORMAL HIGH (ref 70–99)
Glucose-Capillary: 223 mg/dL — ABNORMAL HIGH (ref 70–99)
Glucose-Capillary: 100 mg/dL — ABNORMAL HIGH (ref 70–99)

## 2023-04-12 LAB — LIPID PANEL
Cholesterol: 106 mg/dL (ref 0–200)
HDL: 29 mg/dL — ABNORMAL LOW (ref >40)
LDL Cholesterol: 55 mg/dL (ref 0–99)
Total CHOL/HDL Ratio: 3.7 ratio
Triglycerides: 108 mg/dL (ref <150)
VLDL: 22 mg/dL (ref 0–40)

## 2023-04-12 LAB — MAGNESIUM: Magnesium: 1.9 mg/dL (ref 1.7–2.4)

## 2023-04-12 LAB — T4, FREE: Free T4: 0.99 ng/dL (ref 0.61–1.12)

## 2023-04-12 LAB — APTT: aPTT: 98 s — ABNORMAL HIGH (ref 24–36)

## 2023-04-12 LAB — HEPATITIS B SURFACE ANTIGEN: Hepatitis B Surface Ag: NONREACTIVE

## 2023-04-12 LAB — HEPATITIS C ANTIBODY: HCV Ab: NONREACTIVE

## 2023-04-12 LAB — ABO/RH: ABO/RH(D): O POS

## 2023-04-12 LAB — MRSA NEXT GEN BY PCR, NASAL: MRSA by PCR Next Gen: DETECTED — AB

## 2023-04-12 LAB — TSH: TSH: 1.594 u[IU]/mL (ref 0.350–4.500)

## 2023-04-12 LAB — ANTITHROMBIN III: AntiThromb III Func: 85 % (ref 75–120)

## 2023-04-12 LAB — URIC ACID: Uric Acid, Serum: 5.5 mg/dL (ref 3.7–8.6)

## 2023-04-12 LAB — HIV ANTIBODY (ROUTINE TESTING W REFLEX): HIV Screen 4th Generation wRfx: NONREACTIVE

## 2023-04-12 LAB — HEPARIN LEVEL (UNFRACTIONATED)
Heparin Unfractionated: 0.52 [IU]/mL (ref 0.30–0.70)
Heparin Unfractionated: 0.52 [IU]/mL (ref 0.30–0.70)

## 2023-04-12 LAB — PREALBUMIN: Prealbumin: 13 mg/dL — ABNORMAL LOW (ref 18–38)

## 2023-04-12 MED ORDER — MELATONIN 5 MG PO TABS
2.5000 mg | ORAL_TABLET | Freq: Every day | ORAL | Status: DC
  Administered 2023-04-12 – 2023-04-24 (×13): 2.5 mg via ORAL
  Filled 2023-04-12 (×13): qty 1

## 2023-04-12 MED ORDER — INSULIN ASPART 100 UNIT/ML IJ SOLN: 0.0000 [IU] | Freq: Every day | INTRAMUSCULAR | Status: DC

## 2023-04-12 MED ORDER — SODIUM CHLORIDE 0.9% FLUSH: 10.0000 mL | INTRAVENOUS | Status: DC | PRN

## 2023-04-12 MED ORDER — HEPARIN BOLUS VIA INFUSION
1150.0000 [IU] | Freq: Once | INTRAVENOUS | Status: AC
  Administered 2023-04-12: 1150 [IU] via INTRAVENOUS
  Filled 2023-04-12: qty 1150

## 2023-04-12 MED ORDER — ATORVASTATIN CALCIUM 80 MG PO TABS: 80.0000 mg | ORAL_TABLET | Freq: Every day | ORAL | Status: DC

## 2023-04-12 MED ORDER — SODIUM CHLORIDE 0.9% FLUSH
10.0000 mL | Freq: Two times a day (BID) | INTRAVENOUS | Status: DC
  Administered 2023-04-12 – 2023-04-23 (×18): 10 mL
  Administered 2023-04-23: 30 mL
  Administered 2023-04-24: 10 mL
  Administered 2023-04-24: 20 mL
  Administered 2023-04-25: 30 mL

## 2023-04-12 MED ORDER — ATORVASTATIN CALCIUM 80 MG PO TABS
80.0000 mg | ORAL_TABLET | Freq: Every day | ORAL | Status: DC
  Administered 2023-04-13 – 2023-04-25 (×13): 80 mg via ORAL
  Filled 2023-04-12 (×13): qty 1

## 2023-04-12 MED ORDER — SPIRONOLACTONE 12.5 MG HALF TABLET: 12.5000 mg | ORAL_TABLET | Freq: Every day | ORAL | Status: DC

## 2023-04-12 MED ORDER — INSULIN GLARGINE 100 UNIT/ML ~~LOC~~ SOLN: 10.0000 [IU] | Freq: Every day | SUBCUTANEOUS | Status: DC

## 2023-04-12 MED ORDER — LOSARTAN POTASSIUM 25 MG PO TABS: 12.5000 mg | ORAL_TABLET | Freq: Every day | ORAL | Status: DC

## 2023-04-12 MED ORDER — PANTOPRAZOLE SODIUM 40 MG PO TBEC: 40.0000 mg | DELAYED_RELEASE_TABLET | Freq: Every day | ORAL | Status: DC

## 2023-04-12 MED ORDER — INSULIN ASPART 100 UNIT/ML IJ SOLN
3.0000 [IU] | Freq: Three times a day (TID) | INTRAMUSCULAR | Status: DC
  Administered 2023-04-12: 3 [IU] via SUBCUTANEOUS
  Filled 2023-04-12: qty 1

## 2023-04-12 MED ORDER — CHLORHEXIDINE GLUCONATE CLOTH 2 % EX PADS
6.0000 | MEDICATED_PAD | Freq: Every day | CUTANEOUS | Status: DC
  Administered 2023-04-12 – 2023-04-15 (×4): 6 via TOPICAL

## 2023-04-12 MED ORDER — ASPIRIN 81 MG PO CHEW
81.0000 mg | CHEWABLE_TABLET | Freq: Every day | ORAL | Status: DC
  Administered 2023-04-13 – 2023-04-15 (×3): 81 mg via ORAL
  Filled 2023-04-12 (×3): qty 1

## 2023-04-12 MED ORDER — PANTOPRAZOLE SODIUM 40 MG PO TBEC
40.0000 mg | DELAYED_RELEASE_TABLET | Freq: Every day | ORAL | Status: DC
  Administered 2023-04-13 – 2023-04-25 (×13): 40 mg via ORAL
  Filled 2023-04-12 (×14): qty 1

## 2023-04-12 MED ORDER — BUSPIRONE HCL 10 MG PO TABS
5.0000 mg | ORAL_TABLET | Freq: Two times a day (BID) | ORAL | Status: DC
  Administered 2023-04-12 – 2023-04-25 (×26): 5 mg via ORAL
  Filled 2023-04-12 (×26): qty 1

## 2023-04-12 MED ORDER — SPIRONOLACTONE 12.5 MG HALF TABLET
12.5000 mg | ORAL_TABLET | Freq: Every day | ORAL | Status: DC
  Administered 2023-04-13 – 2023-04-15 (×3): 12.5 mg via ORAL
  Filled 2023-04-12 (×3): qty 1

## 2023-04-12 MED ORDER — INSULIN GLARGINE 100 UNIT/ML ~~LOC~~ SOLN
10.0000 [IU] | Freq: Every day | SUBCUTANEOUS | Status: DC
  Administered 2023-04-13: 10 [IU] via SUBCUTANEOUS
  Filled 2023-04-12 (×2): qty 0.1

## 2023-04-12 MED ORDER — DIGOXIN 125 MCG PO TABS
0.1250 mg | ORAL_TABLET | Freq: Every day | ORAL | Status: DC
  Administered 2023-04-13 – 2023-04-25 (×13): 0.125 mg via ORAL
  Filled 2023-04-12 (×13): qty 1

## 2023-04-12 MED ORDER — MILRINONE LACTATE IN DEXTROSE 20-5 MG/100ML-% IV SOLN
0.1250 ug/kg/min | INTRAVENOUS | Status: DC
  Administered 2023-04-12 – 2023-04-23 (×17): 0.25 ug/kg/min via INTRAVENOUS
  Administered 2023-04-24: 0.125 ug/kg/min via INTRAVENOUS
  Filled 2023-04-12 (×18): qty 100

## 2023-04-12 MED ORDER — INSULIN ASPART 100 UNIT/ML IJ SOLN
0.0000 [IU] | Freq: Three times a day (TID) | INTRAMUSCULAR | Status: DC
  Administered 2023-04-12: 3 [IU] via SUBCUTANEOUS
  Administered 2023-04-13: 7 [IU] via SUBCUTANEOUS
  Administered 2023-04-13: 2 [IU] via SUBCUTANEOUS
  Administered 2023-04-13: 9 [IU] via SUBCUTANEOUS
  Administered 2023-04-14: 3 [IU] via SUBCUTANEOUS

## 2023-04-12 MED ORDER — IOHEXOL 350 MG/ML SOLN
60.0000 mL | Freq: Once | INTRAVENOUS | Status: AC | PRN
  Administered 2023-04-12: 60 mL via INTRAVENOUS

## 2023-04-12 MED ORDER — AMIODARONE HCL 200 MG PO TABS
200.0000 mg | ORAL_TABLET | Freq: Two times a day (BID) | ORAL | Status: DC
  Administered 2023-04-12 – 2023-04-25 (×26): 200 mg via ORAL
  Filled 2023-04-12 (×26): qty 1

## 2023-04-12 MED ORDER — SODIUM CHLORIDE 0.9 % IV SOLN: 250.0000 mL | INTRAVENOUS | Status: AC | PRN

## 2023-04-12 MED ORDER — SODIUM CHLORIDE 0.9% FLUSH: 3.0000 mL | INTRAVENOUS | Status: DC | PRN

## 2023-04-12 MED ORDER — ASPIRIN 81 MG PO CHEW: 81.0000 mg | CHEWABLE_TABLET | Freq: Every day | ORAL | Status: DC

## 2023-04-12 MED ORDER — ONDANSETRON HCL 4 MG/2ML IJ SOLN: 4.0000 mg | Freq: Four times a day (QID) | INTRAMUSCULAR | Status: DC | PRN

## 2023-04-12 MED ORDER — HEPARIN (PORCINE) 25000 UT/250ML-% IV SOLN
1150.0000 [IU]/h | INTRAVENOUS | Status: DC
  Administered 2023-04-12 – 2023-04-13 (×2): 1500 [IU]/h via INTRAVENOUS
  Administered 2023-04-13 – 2023-04-15 (×4): 1600 [IU]/h via INTRAVENOUS
  Administered 2023-04-16: 1500 [IU]/h via INTRAVENOUS
  Administered 2023-04-17 – 2023-04-18 (×2): 1300 [IU]/h via INTRAVENOUS
  Administered 2023-04-19: 1200 [IU]/h via INTRAVENOUS
  Administered 2023-04-19: 1300 [IU]/h via INTRAVENOUS
  Administered 2023-04-20: 1200 [IU]/h via INTRAVENOUS
  Filled 2023-04-12 (×11): qty 250

## 2023-04-12 MED ORDER — MELATONIN 5 MG PO TABS: 2.5000 mg | ORAL_TABLET | Freq: Every day | ORAL | Status: DC

## 2023-04-12 MED ORDER — POTASSIUM CHLORIDE CRYS ER 20 MEQ PO TBCR
40.0000 meq | EXTENDED_RELEASE_TABLET | Freq: Once | ORAL | Status: AC
  Administered 2023-04-12: 40 meq via ORAL
  Filled 2023-04-12: qty 2

## 2023-04-12 MED ORDER — FUROSEMIDE 10 MG/ML IJ SOLN
60.0000 mg | Freq: Two times a day (BID) | INTRAMUSCULAR | Status: DC
  Administered 2023-04-12 – 2023-04-14 (×4): 60 mg via INTRAVENOUS
  Filled 2023-04-12 (×4): qty 6

## 2023-04-12 MED ORDER — ACETAMINOPHEN 325 MG PO TABS: 650.0000 mg | ORAL_TABLET | ORAL | Status: DC | PRN

## 2023-04-12 MED ORDER — INSULIN GLARGINE 100 UNIT/ML ~~LOC~~ SOLN
10.0000 [IU] | Freq: Every day | SUBCUTANEOUS | Status: DC
  Administered 2023-04-12: 10 [IU] via SUBCUTANEOUS
  Filled 2023-04-12: qty 0.1

## 2023-04-12 MED ORDER — INSULIN ASPART 100 UNIT/ML IJ SOLN
3.0000 [IU] | Freq: Three times a day (TID) | INTRAMUSCULAR | Status: DC
  Administered 2023-04-12 – 2023-04-13 (×3): 3 [IU] via SUBCUTANEOUS

## 2023-04-12 MED ORDER — INSULIN ASPART 100 UNIT/ML IJ SOLN: 3.0000 [IU] | Freq: Three times a day (TID) | INTRAMUSCULAR | Status: DC

## 2023-04-12 MED ORDER — DIGOXIN 125 MCG PO TABS: 0.1250 mg | ORAL_TABLET | Freq: Every day | ORAL | Status: DC

## 2023-04-12 MED ORDER — BUSPIRONE HCL 10 MG PO TABS: 5.0000 mg | ORAL_TABLET | Freq: Two times a day (BID) | ORAL | Status: DC

## 2023-04-12 MED ORDER — SODIUM CHLORIDE 0.9% FLUSH
3.0000 mL | Freq: Two times a day (BID) | INTRAVENOUS | Status: DC
Start: 2023-04-12 — End: 2023-04-22
  Administered 2023-04-12 – 2023-04-21 (×19): 3 mL via INTRAVENOUS

## 2023-04-12 MED ORDER — LOSARTAN POTASSIUM 25 MG PO TABS
12.5000 mg | ORAL_TABLET | Freq: Every day | ORAL | Status: DC
Start: 2023-04-13 — End: 2023-04-16
  Administered 2023-04-13 – 2023-04-15 (×3): 12.5 mg via ORAL
  Filled 2023-04-12 (×3): qty 1

## 2023-04-12 MED ORDER — HEPARIN (PORCINE) 25000 UT/250ML-% IV SOLN: 1500.0000 [IU]/h | INTRAVENOUS | Status: DC

## 2023-04-12 NOTE — H&P (Addendum)
 Advanced Heart Failure Team History and Physical Note   PCP:  Marguarite Arbour, MD  PCP-Cardiology: Dr. Juliann Pares [facility]) AHF: Dr. Elwyn Lade   Reason for Admission: Acute on Chronic Stage D Systolic Heart Failure; Evaluation for LVAD     HPI:    Kenneth [name] is a 58 y.o. [name] with a PMH of CAD with PCI to the RCA and OM, chronic heart failure with reduced ejection fraction, diabetes, hyperlipidemia, hypertension, prior CVA, tobacco use who was admitted to [facility] w/ acute on chronic systolic heart failure w/ NYHA IV symptoms.  Has had progressive decline in functional status/exercise tolerance over the last year.    Echo showed severely reduced LVEF 10-15%, severely dilated LV (6.7 LVIDD) w/ large LV thrombus. RV mildly reduced. Placed on heparin gtt.   RHC 3/20 demonstrated severely elevated left sided filling pressures and severely low CI of 1.67.  Given recent smoking history would not be currently eligible for a transplant. Decision made to transfer to [facility] for inpatient opitmization with inotropes and IV diuresis, potential MCS, and urgent LVAD workup.     Home Medications Prior to Admission medications   Medication Sig Start Date End Date Taking? Authorizing Provider  acetaminophen (TYLENOL) 325 MG tablet Take 2 tablets (650 mg total) by mouth every 6 (six) hours as needed for mild pain (pain score 1-3) (or Fever >/= 101). 04/11/23   Sunnie Nielsen, DO  acetaminophen (TYLENOL) 650 MG suppository Place 1 suppository (650 mg total) rectally every 6 (six) hours as needed for mild pain (pain score 1-3) (or Fever >/= 101). 04/11/23   Sunnie Nielsen, DO  albuterol (PROVENTIL) (2.5 MG/3ML) 0.083% nebulizer solution Take 2.5 mg by nebulization every 6 (six) hours as needed for wheezing. 03/26/23 03/25/24  [provider]  albuterol (VENTOLIN HFA) 108 (90 Base) MCG/ACT inhaler Inhale 2 puffs into the lungs every 4 (four) hours as needed for wheezing. 02/18/23 02/18/24   [provider]  aspirin EC 81 MG tablet Take 1 tablet (81 mg total) by mouth daily. Swallow whole. 04/11/23   Sunnie Nielsen, DO  atorvastatin (LIPITOR) 80 MG tablet Take 1 tablet (80 mg total) by mouth daily at 6 PM. 04/11/23 07/10/23  Sunnie Nielsen, DO  budesonide-formoterol [facility]) 160-4.5 MCG/ACT inhaler Inhale 2 puffs into the lungs 2 (two) times daily. 02/26/23 02/26/24  [provider]  buPROPion (WELLBUTRIN SR) 100 MG 12 hr tablet Take 100 mg by mouth 2 (two) times daily.  06/29/15 04/12/17  [provider]  busPIRone (BUSPAR) 5 MG tablet Take 1 tablet by mouth 2 (two) times daily. 04/03/23 04/02/24  [provider]  digoxin (LANOXIN) 0.125 MG tablet Take 1 tablet (0.125 mg total) by mouth daily. 04/12/23   Sunnie Nielsen, DO  enoxaparin (LOVENOX) 100 MG/ML injection Inject 1 mL (100 mg total) into the skin daily. 04/11/23   Sunnie Nielsen, DO  glimepiride (AMARYL) 4 MG tablet Take 1 tablet (4 mg total) by mouth daily with breakfast. Patient not taking: Reported on 04/09/2023 12/31/15   Ramonita Lab, MD  guaiFENesin-dextromethorphan (ROBITUSSIN DM) 100-10 MG/5ML syrup Take 5 mLs by mouth every 4 (four) hours as needed for cough. 04/11/23   Sunnie Nielsen, DO  heparin 13086 UT/250ML infusion Inject 1,500 Units/hr into the vein continuous. 04/12/23   Sunnie Nielsen, DO  HYDROcodone-acetaminophen (NORCO/VICODIN) 5-325 MG tablet Take 1-2 tablets by mouth every 4 (four) hours as needed for moderate pain (pain score 4-6). 04/11/23   Sunnie Nielsen, DO  insulin aspart (  NOVOLOG) 100 UNIT/ML injection Inject 0-5 Units into the skin at bedtime. 04/11/23   Sunnie Nielsen, DO  insulin aspart (NOVOLOG) 100 UNIT/ML injection Inject 0-9 Units into the skin 3 (three) times daily with meals. 04/11/23   Sunnie Nielsen, DO  insulin aspart (NOVOLOG) 100 UNIT/ML injection Inject 3 Units into the skin 3 (three) times daily with meals. 04/12/23   Sunnie Nielsen, DO  insulin glargine (LANTUS) 100 UNIT/ML injection Inject 0.1 mLs (10 Units total) into the skin daily. 04/12/23   Sunnie Nielsen, DO  ipratropium-albuterol (DUONEB) 0.5-2.5 (3) MG/3ML SOLN Take 3 mLs by nebulization every 4 (four) hours as needed. 03/25/23 04/24/23  Merwyn Katos, MD  isosorbide mononitrate (IMDUR) 30 MG 24 hr tablet Take 1 tablet (30 mg total) by mouth daily. 04/11/23   Sunnie Nielsen, DO  lisinopril (ZESTRIL) 40 MG tablet Take 1 tablet (40 mg total) by mouth daily. 09/05/22 12/04/22  Leeroy Bock, MD  losartan (COZAAR) 25 MG tablet Take 0.5 tablets (12.5 mg total) by mouth daily. 04/11/23   Sunnie Nielsen, DO  melatonin 5 MG TABS Take 0.5 tablets (2.5 mg total) by mouth at bedtime. 04/11/23   Sunnie Nielsen, DO  metFORMIN (GLUCOPHAGE) 1000 MG tablet TAKE 1 TABLET BY MOUTH TWICE DAILY WITH MEALS Patient not taking: Reported on 04/09/2023 05/17/17   Anola Gurney, PA  metoprolol succinate (TOPROL-XL) 25 MG 24 hr tablet Take 0.5 tablets (12.5 mg total) by mouth daily. 09/05/22 12/04/22  Leeroy Bock, MD  metoprolol succinate (TOPROL-XL) 25 MG 24 hr tablet Take 0.5 tablets (12.5 mg total) by mouth daily. 04/11/23   Sunnie Nielsen, DO  mometasone-formoterol (DULERA) 200-5 MCG/ACT AERO Inhale 2 puffs into the lungs 2 (two) times daily. 04/11/23   Sunnie Nielsen, DO  Morphine Sulfate (MORPHINE, PF,) 2 MG/ML injection Inject 1 mL (2 mg total) into the vein every 2 (two) hours as needed. 04/11/23   Sunnie Nielsen, DO  nicotine (NICODERM CQ - DOSED IN MG/24 HOURS) 21 mg/24hr patch Place 1 patch (21 mg total) onto the skin daily. Patient not taking: Reported on 04/09/2023 09/06/22   Leeroy Bock, MD  nitroGLYCERIN (NITROSTAT) 0.4 MG SL tablet Place 1 tablet (0.4 mg total) under the tongue every 5 (five) minutes as needed for chest pain. 04/11/23   Sunnie Nielsen, DO  nystatin (MYCOSTATIN) 100000 UNIT/ML suspension Take 5 mLs by mouth 3 (three)  times daily. X 10 days 04/03/23 04/13/23  [provider]  omeprazole (PRILOSEC) 20 MG capsule Take 20 mg by mouth daily. 01/23/22   [provider]  ondansetron (ZOFRAN) 4 MG tablet Take 1 tablet (4 mg total) by mouth every 6 (six) hours as needed for nausea. 04/11/23   Sunnie Nielsen, DO  ondansetron [facility] 4 MG/2ML SOLN injection Inject 2 mLs (4 mg total) into the vein every 6 (six) hours as needed for nausea. 04/11/23   Sunnie Nielsen, DO  pantoprazole (PROTONIX) 40 MG tablet Take 1 tablet (40 mg total) by mouth daily. 04/12/23   Sunnie Nielsen, DO  predniSONE (STERAPRED UNI-PAK 21 TAB) 10 MG (21) TBPK tablet As directed on packaging Patient not taking: Reported on 04/09/2023 03/25/23   Merwyn Katos, MD  Spacer/Aero-Holding Chambers (AEROCHAMBER MV) inhaler Use as instructed 03/25/23   Merwyn Katos, MD  spironolactone (ALDACTONE) 25 MG tablet Take 0.5 tablets (12.5 mg total) by mouth daily. 09/06/22 12/05/22  Leeroy Bock, MD  spironolactone (ALDACTONE) 25 MG tablet Take 0.5 tablets (12.5 mg total) by mouth  daily. 04/11/23   Sunnie Nielsen, DO  warfarin (COUMADIN) 5 MG tablet PHARMACY TO DOSE 04/11/23   Sunnie Nielsen, DO    Past Medical History: Past Medical History:  Diagnosis Date   Coronary artery disease    Diabetes mellitus without complication (HCC)    GERD (gastroesophageal reflux disease)    Hypertension    Stroke (cerebrum) [facility])     Past Surgical History: Past Surgical History:  Procedure Laterality Date   CORONARY ANGIOPLASTY WITH STENT PLACEMENT  06/18/2008   CORONARY ANGIOPLASTY WITH STENT PLACEMENT     CORONARY STENT INTERVENTION N/A 09/04/2022   Procedure: CORONARY STENT INTERVENTION;  Surgeon: Alwyn Pea, MD;  Location: [facility] INVASIVE CV LAB;  Service: Cardiovascular;  Laterality: N/A;   LEFT HEART CATH AND CORONARY ANGIOGRAPHY N/A 09/04/2022   Procedure: LEFT HEART CATH AND CORONARY ANGIOGRAPHY;  Surgeon: Alwyn Pea, MD;  Location: [facility] INVASIVE CV LAB;  Service: Cardiovascular;  Laterality: N/A;   RIGHT HEART CATH N/A 04/11/2023   Procedure: RIGHT HEART CATH;  Surgeon: Marcina Millard, MD;  Location: [facility] INVASIVE CV LAB;  Service: Cardiovascular;  Laterality: N/A;   SHOULDER ARTHROSCOPY WITH ROTATOR CUFF REPAIR AND SUBACROMIAL DECOMPRESSION Right 04/19/2017   Procedure: SHOULDER ARTHROSCOPY WITH ROTATOR CUFF REPAIR AND SUBACROMIAL DECOMPRESSION;  Surgeon: Signa Kell, MD;  Location: [facility] ORS;  Service: Orthopedics;  Laterality: Right;  distal clavical excision, bicepts tenotomy    Family History:  Family History  Problem Relation Age of Onset   Heart attack Mother    Heart failure Mother    Coronary artery disease Father    Diabetes Father     Social History: Social History   Socioeconomic History   Marital status: Married    Spouse name: Not on file   Number of children: Not on file   Years of education: Not on file   Highest education level: Not on file  Occupational History   Not on file  Tobacco Use   Smoking status: Heavy Smoker    Current packs/day: 1.50    Types: Cigarettes   Smokeless tobacco: Current    Types: Chew   Tobacco comments:    on his own terms  Vaping Use   Vaping status: Never Used  Substance and Sexual Activity   Alcohol use: No    Alcohol/week: 0.0 standard drinks of alcohol    Comment: OCCASIONALLY   Drug use: No   Sexual activity: Not on file  Other Topics Concern   Not on file  Social History Narrative   Not on file   Social Drivers of Health   Financial Resource Strain: Low Risk  (02/18/2023)   Received from [facility]   Overall Financial Resource Strain (CARDIA)    Difficulty of Paying Living Expenses: Not hard at all  Food Insecurity: No Food Insecurity (04/09/2023)   Hunger Vital Sign    Worried About Running Out of Food in the Last Year: Never true    Ran Out of Food in the Last Year: Never true  Transportation  Needs: No Transportation Needs (04/09/2023)   PRAPARE - Administrator, Civil Service (Medical): No    Lack of Transportation (Non-Medical): No  Physical Activity: Not on file  Stress: Not on file  Social Connections: Unknown (04/09/2023)   Social Connection and Isolation Panel [NHANES]    Frequency of Communication with Friends and Family: Never    Frequency of Social Gatherings with Friends and Family: Not on file  Attends Religious Services: Never    Active Member of Clubs or Organizations: No    Attends Banker Meetings: Never    Marital Status: Married    Allergies:  No Known Allergies  Objective:    Vital Signs:   Temp:  [97.7 F (36.5 C)-98.7 F (37.1 C)] 98.7 F (37.1 C) (03/21 1338) Pulse Rate:  [90-108] 90 (03/21 1338) Resp:  [18-19] 18 (03/21 1338) BP: (100-119)/(Kenneth-92) 108/82 (03/21 1338) SpO2:  [92 %-100 %] 98 % (03/21 1338)   There were no vitals filed for this visit.   Physical Exam     General:  Fatigued appearing Neck: + JVD Cor: Regular rate & rhythm. No rubs, gallops or murmurs. Lungs: Clear Abdomen: Soft, nontender, nondistended.  Extremities: No edema Neuro: Alert & oriented x 3. Affect pleasant.   Telemetry   SR 90s, frequent PVCs  Labs     Basic Metabolic Panel: Recent Labs  Lab 04/09/23 0121 04/10/23 0159 04/11/23 0510 04/11/23 0944 04/11/23 0958 04/12/23 0537  NA 133* 136 137 140 137 136  K 4.5 3.8 3.6 3.4* 3.5 4.2  CL 102 102 101  --   --  104  CO2 21* 26 27  --   --  21*  GLUCOSE 349* 114* 299*  --   --  336*  BUN 23* 25* 22*  --   --  21*  CREATININE 1.17 1.26* 1.07  --   --  0.94  CALCIUM 8.8* 8.7* 8.4*  --   --  8.5*    Liver Function Tests: Recent Labs  Lab 04/09/23 0121  AST 29  ALT 40  ALKPHOS 90  BILITOT 1.9*  PROT 6.0*  ALBUMIN 3.0*   No results for input(s): "LIPASE", "AMYLASE" in the last 168 hours. No results for input(s): "AMMONIA" in the last 168 hours.  CBC: Recent  Labs  Lab 04/09/23 0121 04/10/23 0812 04/11/23 0510 04/11/23 0944 04/11/23 0958 04/12/23 0537  WBC 9.7 11.4* 8.8  --   --  7.9  NEUTROABS 7.3  --   --   --   --   --   HGB 15.4 15.0 14.5 15.0 15.0 14.6  HCT 46.5 43.1 42.4 44.0 44.0 43.8  MCV 88.2 85.3 84.6  --   --  87.4  PLT 215 212 215  --   --  217    Cardiac Enzymes: No results for input(s): "CKTOTAL", "CKMB", "CKMBINDEX", "TROPONINI" in the last 168 hours.  BNP: BNP (last 3 results) Recent Labs    02/18/23 1348 04/09/23 0121  BNP 697.3* 1,185.6*    ProBNP (last 3 results) No results for input(s): "PROBNP" in the last 8760 hours.   CBG: Recent Labs  Lab 04/11/23 1059 04/11/23 1619 04/11/23 2117 04/12/23 0736 04/12/23 1230  GLUCAP 264* 333* 386* 299* 319*    Coagulation Studies: Recent Labs    04/09/23 1924 04/11/23 0510 04/12/23 0537  LABPROT 14.7 15.5* 15.0  INR 1.1 1.2 1.2    Imaging: [place] EKG SITE RITE Result Date: 04/12/2023 If Site Rite image not attached, placement could not be confirmed due to current cardiac rhythm.  CARDIAC CATHETERIZATION Result Date: 04/11/2023   Hemodynamic findings consistent with moderate pulmonary hypertension. 1.  Moderate pulmonary hypertension, PA mean 36 mmHg 2.  Pulmonary capillary wedge 26 mmHg 3.  Fick cardiac output 3.19     Patient Profile   58 y.o. [name] with a PMH of CAD with PCI to the RCA and OM, chronic heart failure with  reduced ejection fraction, diabetes, hyperlipidemia, hypertension, prior CVA, tobacco use. Admitted w/ acute on chronic systolic heart failure w/ NYHA IV symptoms, Stage D CM w/ low output by RHC. Transferred to [facility] for opitmization with inotropes and IV diuresis, potential MCS, and urgent LVAD workup.    Assessment/Plan  Acute on chronic systolic heart failure: Echocardiogram reviewed, large LV thrombus and severely dilated LV, 6.7 LVIDD.  NYHA IV symptoms on admission, ACC/AHA Stage D cardiomyopathy. RHC with severely elevated left  sided filling pressures and CI of 1.67.   -Ordered PICC line. For now run milrinone peripherally at 0.25 mcg/kg/min -Volume overloaded. Start IV lasix 60 BID -Continue losartan 12.5 mg daily and 12.5 mg spiro mg daily.  -Held beta blocker with low-output -Digoxin 0.125 mg daily -Given recent smoking history would not be currently eligible for a transplant. Start LVAD workup. Possible bridge to transplant. Potential barriers to advanced therapies include uncontrolled diabetes, tobacco use, lack of insurance and potential COPD   CAD: Known disease, though LAD appears largely open on last catetherization.  Chest pain on presentation likely d/t decompensated HF. -Continue aspirin, statin   LV thrombus: Due to severe cardiomyopathy. -Heparin gtt   Diabetes: Hgb A1c 12.0. Needs extensive control prior to implant.   Tobacco use: Extensive. 3 ppd smoker until 12/24. CT scan 02/25 reviewed and does not have significant emphysematous changes. - obtain PFTs pre VAD  PVCs: Add po amiodarone 200 mg BID -Can switch to IV infusion if needed should he have more ectopy once starting milrinone   SDOH: Does not have health insurance. HF CSW to see.   FINCH, LINDSAY N, PA-C 04/12/2023, 3:46 PM  Advanced Heart Failure Team Pager [phone] (M-F; 7a - 5p)  Please contact [facility] for night-coverage after hours (4p -7a ) and weekends on [email or web address]   Patient seen with PA, I formulated the plan and agree with the above note.   Kenneth y.o. with history of CAD, smoking, DM, cardiomyopathy, prior CVA, and LV thrombus was transferred from [facility] for evaluation for LVAD.    Patient has had PCI to RCA and OM1 in the past.  Most recent cath was in 8/24.  Patient presented with NSTEMI, LHC showed CTO RCA at site of prior stent and new occlusion of OM1, likely culprit.  Patient had PCI to OM1.  Echo at that time showed EF 20-25%.  Around 12/24, patient began developing progressive exertional dyspnea, now NYHA class  IV symptoms.  He was admitted with dyspnea, echo this admission showed EF 10-15%, severe LV dilation with LV thrombus, mild RV dysfunction.  He was gently diuresed, getting Lasix 40 mg IV daily x 2 days.  RHC showed elevated filling pressures with CI 1.67.  HS-TnI was not significantly elevated (22 => 21).  Patient was transferred to [facility] given low output HF for evaluation for LVAD. Of note, patient still smokes (down to 1 pack/week, prior 3 ppd).  He works full time as a Scientist, research (medical).  No ETOH/drugs. Has FH of CAD.   General: NAD Neck: JVP 14 cm, no thyromegaly or thyroid nodule.  Lungs: Clear to auscultation bilaterally with normal respiratory effort. CV: Nondisplaced PMI.  Heart regular S1/S2, no S3/S4, no murmur.  No peripheral edema.  No carotid bruit.  Normal pedal pulses.  Abdomen: Soft, nontender, no hepatosplenomegaly, no distention.  Skin: Intact without lesions or rashes.  Neurologic: Alert and oriented x 3.  Psych: Normal affect. Extremities: No clubbing or cyanosis.  HEENT: Normal.   1. Acute  on chronic systolic CHF: ?Primarily ischemic cardiomyopathy.  Echo in 8/24 at time of last NSTEMI with EF 25-30%.  Echo this admission with EF 10-15%, severe LV dilation with LV thrombus, mild RV dysfunction.  His cardiomyopathy does seem out of proportion to his coronary disease.  He does not drink ETOH or use drugs.  No family history of dilated cardiomyopathy (FH of CAD).  He does have uncontrolled DM as well as a chronic wide LBBB (176 msec) which could contribute. Also noted to have frequent PVCs.  PCWP elevated on RHC yesterday, CI low at 1.67.  He is volume overloaded on exam with JVD.  - Place PICC to follow CVP and co-ox.  - Milrinone 0.25 mcg/kg/min for now.  - Lasix 60 mg IV bid and follow response.  - digoxin 0.125 daily.  - losartan 12.5 daily - spironolactone 12.5 daily.  - Hold Toprol XL for now with low output.  - We have started discussion of LVAD with Mr Rizzi.  He has low output  HF with quite low EF.  Not a transplant candidate at this time with active smoking prior to admission.  Barriers for LVAD will be poor DM control (hgbA1c 12) and lack of insurance.   As above, his cardiomyopathy appears out of proportion to CAD and wide LBBB + uncontrolled DM + PVCs may all play a role. I will order a cardiac MRI.  If he does not have a large amount of scar in his LV myocardium, consider diuresis, aggressive med adjustment and wean from milrinone with CRT placement in the near future (?can he avoid LVAD). If he has significant LV myocardial scarring, CRT may be less likely to help and he would be more likely to need LVAD placement.  2. LV thrombus: Heparin gtt.  3. PVCs: Patient noted to have frequent PVCs on telemetry.   - With addition of milrinone, will start amiodarone 200 mg po bid.  May need to transition to IV if he has NSVT runs or very frequent PVCs.  4. CAD: Remote PCI to RCA, cath in 8/24 showed chronically occluded proximal RCA.  NSTEMI in 8/24 with occluded OM1 treated with DES.  LAD with minimal disease.  No chest pain currently.  HS-TnI not significantly elevated, no ACS this admission.  - He is now off Plavix (> 6 months post PCI).  - Continue ASA 81.  - Continue statin.  5. DM2: Poor control, hgbA1c 12.   - Diabetes coordinator consult. - SSI.  6. Smoking: He is motivated to quit.  7. Will need social worker to help [place] with getting insurance set up.   Marca Ancona 04/12/2023 4:22 PM

## 2023-04-12 NOTE — Progress Notes (Signed)
 PHARMACY - ANTICOAGULATION CONSULT NOTE  Pharmacy Consult for heparin infusion and warfarin Indication: Large LV thrombus   No Known Allergies  Patient Measurements: Height: 5\' 9"  (175.3 cm) Weight: 75.1 kg (165 lb 9.1 oz) IBW/kg (Calculated) : 70.7 Heparin Dosing Weight: 76.2 kg  Vital Signs: Temp: 98.2 F (36.8 C) (03/20 2322) Temp Source: Oral (03/20 1958) BP: 105/84 (03/20 2322) Pulse Rate: 90 (03/20 2322)  Labs: Recent Labs    04/09/23 0121 04/09/23 0307 04/09/23 1924 04/10/23 0159 04/10/23 0159 04/10/23 1610 04/11/23 0510 04/11/23 0944 04/11/23 0958 04/11/23 2307  HGB 15.4  --   --   --   --  15.0 14.5 15.0 15.0  --   HCT 46.5  --   --   --   --  43.1 42.4 44.0 44.0  --   PLT 215  --   --   --   --  212 215  --   --   --   APTT  --   --  32  --   --   --   --   --   --   --   LABPROT  --   --  14.7  --   --   --  15.5*  --   --   --   INR  --   --  1.1  --   --   --  1.2  --   --   --   HEPARINUNFRC  --   --   --  0.47   < > 0.50 0.42  --   --  0.26*  CREATININE 1.17  --   --  1.26*  --   --  1.07  --   --   --   TROPONINIHS 22* 21*  --   --   --   --   --   --   --   --    < > = values in this interval not displayed.   Estimated Creatinine Clearance: 75.3 mL/min (by C-G formula based on SCr of 1.07 mg/dL).  Medical History: Past Medical History:  Diagnosis Date   Coronary artery disease    Diabetes mellitus without complication (HCC)    GERD (gastroesophageal reflux disease)    Hypertension    Stroke (cerebrum) (HCC)     Medications:  Scheduled:   aspirin EC  81 mg Oral Daily   atorvastatin  80 mg Oral q1800   busPIRone  5 mg Oral BID   digoxin  0.125 mg Oral Daily   insulin aspart  0-5 Units Subcutaneous QHS   insulin aspart  0-9 Units Subcutaneous TID WC   isosorbide mononitrate  30 mg Oral Daily   losartan  12.5 mg Oral Daily   melatonin  2.5 mg Oral QHS   metoprolol succinate  12.5 mg Oral Daily   mometasone-formoterol  2 puff Inhalation  BID   nicotine  21 mg Transdermal Daily   pantoprazole  40 mg Oral Daily   spironolactone  12.5 mg Oral Daily   Assessment: 58 y.o. male with CAD s/p PCI in 08/2022, hypertension, GERD, type 2 diabetes, hyperlipidemia, and tobacco use who presented with worsening shortness of breath over several months. Large LV thrombus noted on ECHO. No chronic anticoagulation noted based on dispense record  Baseline Labs: H&H/PLT wnl, INR/aPTT   Results: Date Time HL INR Comments  3/19 0159 HL=0.47  Heparin therapeutic x 1, INR 1.1 at baseline  3/19 0812 HL=0.50  1.1 Heparin therapeutic x 2  3/20 0510 0.42 1.2 Heparin therapeutic x 3  3/20    Resume after cathlab - transfer to [facility] for urgent LVAD workup  3/20 2307 0.26  Subtherapeutic    Plan:  Bolus 1150 units x 1 Increase heparin infusion rate to 1500 units/hour  Recheck HL in 6 hrs after rate change Continue to monitor CBC daily while on heparin infusion.  Otelia Sergeant, PharmD, MBA 04/12/2023 1:00 AM

## 2023-04-12 NOTE — Progress Notes (Addendum)
 PHARMACY - ANTICOAGULATION CONSULT NOTE  Pharmacy Consult for heparin infusion and warfarin Indication: Large LV thrombus   No Known Allergies  Patient Measurements: Height: 5\' 9"  (175.3 cm) Weight: 75.1 kg (165 lb 9.1 oz) IBW/kg (Calculated) : 70.7 Heparin Dosing Weight: 76.2 kg  Vital Signs: Temp: 98.7 F (37.1 C) (03/21 0734) BP: 108/82 (03/21 0734) Pulse Rate: 90 (03/21 0734)  Labs: Recent Labs    04/09/23 1924 04/10/23 0159 04/10/23 0159 04/10/23 2595 04/11/23 0510 04/11/23 0944 04/11/23 0958 04/11/23 2307 04/12/23 0537 04/12/23 0814  HGB  --   --    < > 15.0 14.5 15.0 15.0  --  14.6  --   HCT  --   --    < > 43.1 42.4 44.0 44.0  --  43.8  --   PLT  --   --   --  212 215  --   --   --  217  --   APTT 32  --   --   --   --   --   --   --   --   --   LABPROT 14.7  --   --   --  15.5*  --   --   --  15.0  --   INR 1.1  --   --   --  1.2  --   --   --  1.2  --   HEPARINUNFRC  --  0.47   < > 0.50 0.42  --   --  0.26*  --  0.52  CREATININE  --  1.26*  --   --  1.07  --   --   --  0.94  --    < > = values in this interval not displayed.   Estimated Creatinine Clearance: 85.7 mL/min (by C-G formula based on SCr of 0.94 mg/dL).  Medical History: Past Medical History:  Diagnosis Date   Coronary artery disease    Diabetes mellitus without complication (HCC)    GERD (gastroesophageal reflux disease)    Hypertension    Stroke (cerebrum) (HCC)     Medications:  Scheduled:   aspirin EC  81 mg Oral Daily   atorvastatin  80 mg Oral q1800   busPIRone  5 mg Oral BID   digoxin  0.125 mg Oral Daily   insulin aspart  0-5 Units Subcutaneous QHS   insulin aspart  0-9 Units Subcutaneous TID WC   insulin aspart  3 Units Subcutaneous TID WC   insulin glargine  10 Units Subcutaneous Daily   isosorbide mononitrate  30 mg Oral Daily   losartan  12.5 mg Oral Daily   melatonin  2.5 mg Oral QHS   metoprolol succinate  12.5 mg Oral Daily   mometasone-formoterol  2 puff  Inhalation BID   nicotine  21 mg Transdermal Daily   pantoprazole  40 mg Oral Daily   spironolactone  12.5 mg Oral Daily   Assessment: 58 y.o. male with CAD s/p PCI in 08/2022, hypertension, GERD, type 2 diabetes, hyperlipidemia, and tobacco use who presented with worsening shortness of breath over several months. Large LV thrombus noted on ECHO. No chronic anticoagulation noted based on dispense record. Pt does not have insurance, so plan may be to send pt home on warfarin with enoxaparin bridge (goodrx at [employer] $58 for 10 syringes).    Results: Date Time HL INR Comments  3/19 0159 HL=0.47  Heparin therapeutic x 1, INR 1.1 at baseline  3/19  3244 HL=0.50 1.1 Heparin therapeutic x 2  3/20 0510 0.42 1.2 Heparin therapeutic x 3  3/20    Resume after cathlab - transfer to [facility] for urgent LVAD workup  3/20 2307 0.26  Subtherapeutic  3/21 0814 0.52  Therapeutic.     Plan:  Heparin level is therapeutic. Will continue heparin infusion at 1500 units/hr. Recheck heparin level in 6 hours. CBC daily while on heparin.   Paschal Dopp, PharmD, 04/12/2023 9:43 AM

## 2023-04-12 NOTE — Plan of Care (Signed)

## 2023-04-12 NOTE — Inpatient Diabetes Management (Signed)
 Inpatient Diabetes Program Recommendations  AACE/ADA: New Consensus Statement on Inpatient Glycemic Control   Target Ranges:  Prepandial:   less than 140 mg/dL      Peak postprandial:   less than 180 mg/dL (1-2 hours)      Critically ill patients:  140 - 180 mg/dL    Latest Reference Range & Units 04/11/23 08:08 04/11/23 10:59 04/11/23 16:19 04/11/23 21:17 04/12/23 07:36  Glucose-Capillary 70 - 99 mg/dL 132 (H) 440 (H) 102 (H) 386 (H) 299 (H)    Latest Reference Range & Units 04/10/23 08:44 04/10/23 13:38 04/10/23 17:47 04/10/23 22:07  Glucose-Capillary 70 - 99 mg/dL 725 (H) 366 (H) 440 (H) 247 (H)   Review of Glycemic Control  Diabetes history: DM2 Outpatient Diabetes medications: Metformin 1000 mg BID (recently resumed taking 1-2 weeks ago), Amaryl 4 mg daily (not taking) Current orders for Inpatient glycemic control: Novolog 0-9 units TID with meals, Novolog 0-5 units QHS  Inpatient Diabetes Program Recommendations:    Insulin: CBGs ranged from 243-386 mg/dl on 3/47/42. Please consider ordering Semglee 10 units Q24H (starting now) and Novolog 3 units TID with meals for meal coverage if patient eats at least 50% of meals.  Thanks, Orlando Penner, RN, MSN, CDCES Diabetes Coordinator Inpatient Diabetes Program [phone] (Team Pager from 8am to 5pm)

## 2023-04-12 NOTE — Progress Notes (Signed)
 Peripherally Inserted Central Catheter Placement  The IV Nurse has discussed with the patient and/or persons authorized to consent for the patient, the purpose of this procedure and the potential benefits and risks involved with this procedure.  The benefits include less needle sticks, lab draws from the catheter, and the patient may be discharged home with the catheter. Risks include, but not limited to, infection, bleeding, blood clot (thrombus formation), and puncture of an artery; nerve damage and irregular heartbeat and possibility to perform a PICC exchange if needed/ordered by physician.  Alternatives to this procedure were also discussed.  Bard Power PICC patient education guide, fact sheet on infection prevention and patient information card has been provided to patient /or left at bedside.    PICC Placement Documentation  PICC Triple Lumen 04/12/23 Right Basilic 39 cm 0 cm (Active)  Indication for Insertion or Continuance of Line Chronic illness with exacerbations (CF, Sickle Cell, etc.) 04/12/23 1853  Exposed Catheter (cm) 0 cm 04/12/23 1853  Site Assessment Clean, Dry, Intact 04/12/23 1853  Lumen #1 Status Flushed;Saline locked;Blood return noted 04/12/23 1853  Lumen #2 Status Flushed;Saline locked;Blood return noted 04/12/23 1853  Lumen #3 Status Flushed;Saline locked;Blood return noted 04/12/23 1853  Dressing Type Transparent;Securing device 04/12/23 1853  Dressing Status Antimicrobial disc/dressing in place 04/12/23 1853  Line Care Connections checked and tightened 04/12/23 1853  Line Adjustment (NICU/IV Team Only) No 04/12/23 1853  Dressing Intervention New dressing;Adhesive placed at insertion site (IV team only) 04/12/23 1853  Dressing Change Due 04/19/23 04/12/23 1853       Elenore Paddy 04/12/2023, 6:54 PM

## 2023-04-12 NOTE — Discharge Summary (Signed)
 Physician Discharge Summary   Patient: Kenneth [name] MRN: [id number]  DOB: 1965-08-24   Admit:     Date of Admission: 04/09/2023 Admitted from: home   Discharge: Date of discharge: 04/12/23 Disposition:  [facility] Condition at discharge: stable  CODE STATUS: FULL CODE     Discharge Physician: Sunnie Nielsen, DO [facility]     PCP: Marguarite Arbour, MD  Recommendations for Outpatient Follow-up:  Pending clinical course at [facility]    Discharge Instructions     Diet - low sodium heart healthy   Complete by: As directed    Discharge patient   Complete by: As directed    TRANSFER TO [facility]   Discharge disposition: [facility] Not Defined   Discharge patient date: 04/12/2023   Increase activity slowly   Complete by: As directed          Discharge Diagnoses: Principal Problem: Coronary Artery Disease  Active Problems: Acute on Chronic HFrEF LV apical thrombus CAD s/p remote PCI to RCA and PCI to OM1 in 2024  Chronic LBBB DM2 HLD HTN Hx CVA Tobacco use        Hospital course / significant events:   HPI: Kenneth [name] is a 58 y.o. male  with a past medical history of CAD s/p remote PCI to RCA and PCI to OM1 in 2024, chronic HFrEF uncontrolled DM2, HLD, HTN, hx CVA, ongoing tobacco use who presented to the ED on 04/09/2023 for chest pain, shortness of breath.   03/18: admitted to hospitalist service. EF 10-15%, severely reduced LV function, mildly reduced RV function with moderately elevated PASP.  Large LV thrombus noted. Started heparin gtt.  03/19: Cardiology saw pt this morning. Echo results reviewed w/ patient and family. Explained significantly weak heart function and resultant LV apical thrombus. Transition to warfarin. Plan transition to po diuresis. Pt was wanting to elave AMA but agreeable to stay. Cardiology planning for cardiac cath tomorrow, will hold warfarin for now 03/20: RHC severe  disease, cardiology recs for transfer to [facility] for LVAD eval. Dr Gala Romney is accepting to cardiology service requesting bed on 2C unit.  03/21: bed available, transfer / discharge ordered  Consultants:  Cardiology   Procedures/Surgeries: 04/11/23 R heart cath       ASSESSMENT & PLAN:    # Acute on chronic HFrEF, severely reduced EF # Coronary artery disease # Chronic LBBB RHC today 04/11/23 - severe disease  aspirin 81 mg daily. atorvastatin 80 mg daily  Digoxin 0.125 mg daily Furosemide per cardiology  Isosorbide mononitrate 30 mg daily  losartan 12.5 mg daily.  metoprolol XL 12.5 mg daily spironolactone 12.5 mg daily significantly elevated A1c will defer SGLT2  To [place] for further eval/tx likely for  LVAD    # LV apical thrombus # Subtherapeutic INR Heparin gtt  # Elevated troponin, have reasonably r/o ACS Minimally elevated and flat trending troponin most consistent with demand/supply mismatch and not ACS    # Tobacco abuse # COPD Strongly encouraged complete cessation nicotine patches. Dulera inhaler bid  Robitussin DM prn cough  Albuterol prn wheeze  # Diabetes type 2 Recommend follow inpatient w/ diabetes educator Insulin sliding scale + basal - titrate as needed    # Anxiety BuSpar 5 mg bid   No concerns based on BMI: Body mass index is 24.81 kg/m.  Underweight - under 18  overweight - 25 to 29 obese - 30 or more Class  1 obesity: BMI of 30.0 to 34 Class 2 obesity: BMI of 35.0 to 39 Class 3 obesity: BMI of 40.0 to 49 Super Morbid Obesity: BMI 50-59 Super-super Morbid Obesity: BMI 60+ Significantly low or high BMI is associated with higher medical risk.  Weight management advised as adjunct to other disease management and risk reduction treatments    DVT prophylaxis: heparin IV fluids: no continuous IV fluids  Nutrition: cardiac / diabetic diet Central lines / other devices: none  Code Status: FULL CODE ACP documentation  reviewed:  none on file in [facility]          Discharge Instructions  Allergies as of 04/12/2023   No Known Allergies      Medication List     STOP taking these medications    AeroChamber MV inhaler   budesonide-formoterol 160-4.5 MCG/ACT inhaler Commonly known as: SYMBICORT Replaced by: mometasone-formoterol 200-5 MCG/ACT Aero   buPROPion ER 100 MG 12 hr tablet Commonly known as: WELLBUTRIN SR   glimepiride 4 MG tablet Commonly known as: AMARYL   ipratropium-albuterol 0.5-2.5 (3) MG/3ML Soln Commonly known as: DUONEB   lisinopril 40 MG tablet Commonly known as: ZESTRIL   metFORMIN 1000 MG tablet Commonly known as: GLUCOPHAGE   nystatin 100000 UNIT/ML suspension Commonly known as: MYCOSTATIN   omeprazole 20 MG capsule Commonly known as: PRILOSEC Replaced by: pantoprazole 40 MG tablet   predniSONE 10 MG (21) Tbpk tablet Commonly known as: STERAPRED UNI-PAK 21 TAB       TAKE these medications    acetaminophen 325 MG tablet Commonly known as: TYLENOL Take 2 tablets (650 mg total) by mouth every 6 (six) hours as needed for mild pain (pain score 1-3) (or Fever >/= 101).   acetaminophen 650 MG suppository Commonly known as: TYLENOL Place 1 suppository (650 mg total) rectally every 6 (six) hours as needed for mild pain (pain score 1-3) (or Fever >/= 101).   albuterol (2.5 MG/3ML) 0.083% nebulizer solution Commonly known as: PROVENTIL Take 2.5 mg by nebulization every 6 (six) hours as needed for wheezing. What changed: Another medication with the same name was removed. Continue taking this medication, and follow the directions you see here.   aspirin EC 81 MG tablet Take 1 tablet (81 mg total) by mouth daily. Swallow whole.   atorvastatin 80 MG tablet Commonly known as: LIPITOR Take 1 tablet (80 mg total) by mouth daily at 6 PM.   busPIRone 5 MG tablet Commonly known as: BUSPAR Take 1 tablet by mouth 2 (two) times daily.   digoxin 0.125 MG  tablet Commonly known as: LANOXIN Take 1 tablet (0.125 mg total) by mouth daily.   enoxaparin 100 MG/ML injection Commonly known as: LOVENOX Inject 1 mL (100 mg total) into the skin daily.   guaiFENesin-dextromethorphan 100-10 MG/5ML syrup Commonly known as: ROBITUSSIN DM Take 5 mLs by mouth every 4 (four) hours as needed for cough.   heparin 08657 UT/250ML infusion Inject 1,500 Units/hr into the vein continuous.   HYDROcodone-acetaminophen 5-325 MG tablet Commonly known as: NORCO/VICODIN Take 1-2 tablets by mouth every 4 (four) hours as needed for moderate pain (pain score 4-6).   insulin aspart 100 UNIT/ML injection Commonly known as: novoLOG Inject 0-5 Units into the skin at bedtime.   insulin aspart 100 UNIT/ML injection Commonly known as: novoLOG Inject 0-9 Units into the skin 3 (three) times daily with meals.   insulin aspart 100 UNIT/ML injection Commonly known as: novoLOG Inject 3 Units into the skin 3 (three) times daily  with meals.   insulin glargine 100 UNIT/ML injection Commonly known as: LANTUS Inject 0.1 mLs (10 Units total) into the skin daily.   isosorbide mononitrate 30 MG 24 hr tablet Commonly known as: IMDUR Take 1 tablet (30 mg total) by mouth daily.   losartan 25 MG tablet Commonly known as: COZAAR Take 0.5 tablets (12.5 mg total) by mouth daily.   melatonin 5 MG Tabs Take 0.5 tablets (2.5 mg total) by mouth at bedtime.   metoprolol succinate 25 MG 24 hr tablet Commonly known as: TOPROL-XL Take 0.5 tablets (12.5 mg total) by mouth daily.   mometasone-formoterol 200-5 MCG/ACT Aero Commonly known as: DULERA Inhale 2 puffs into the lungs 2 (two) times daily. Replaces: budesonide-formoterol 160-4.5 MCG/ACT inhaler   morphine (PF) 2 MG/ML injection Inject 1 mL (2 mg total) into the vein every 2 (two) hours as needed.   Nicotine Step 1 21 MG/24HR patch Generic drug: nicotine Place 1 patch (21 mg total) onto the skin daily.   nitroGLYCERIN  0.4 MG SL tablet Commonly known as: NITROSTAT Place 1 tablet (0.4 mg total) under the tongue every 5 (five) minutes as needed for chest pain.   ondansetron 4 MG tablet Commonly known as: ZOFRAN Take 1 tablet (4 mg total) by mouth every 6 (six) hours as needed for nausea.   ondansetron 4 MG/2ML Soln injection Commonly known as: ZOFRAN Inject 2 mLs (4 mg total) into the vein every 6 (six) hours as needed for nausea.   pantoprazole 40 MG tablet Commonly known as: PROTONIX Take 1 tablet (40 mg total) by mouth daily. Replaces: omeprazole 20 MG capsule   spironolactone 25 MG tablet Commonly known as: ALDACTONE Take 0.5 tablets (12.5 mg total) by mouth daily.   warfarin 5 MG tablet Commonly known as: COUMADIN PHARMACY TO DOSE         Follow-up Information     Marcina Millard, MD. Go to.   Specialty: Cardiology Why: Appointment scheduled with Dr. Darrold Junker for 4/17 at 3 PM Contact information: [address] [facility] [place] [address] [phone]                 No Known Allergies   Subjective: pt has no concerns, denies chest pain, no SOB   Discharge Exam: BP 108/82 (BP Location: Right Arm)   Pulse 90   Temp 98.7 F (37.1 C)   Resp 18   Ht 5\' 9"  (1.753 m)   Wt 75.1 kg   SpO2 98%   BMI 24.45 kg/m  General: Pt is alert, awake, not in acute distress, oriented x3 Cardiovascular: RRR, S1/S2 +, no rubs, no gallops Respiratory: no wheezing, no rhonchi, +rales t bases  Abdominal: Soft Extremities: no edema, no cyanosis     The results of significant diagnostics from this hospitalization (including imaging, microbiology, ancillary and laboratory) are listed below for reference.     Microbiology: No results found for this or any previous visit (from the past 240 hours).   Labs: BNP (last 3 results) Recent Labs    02/18/23 1348 04/09/23 0121  BNP 697.3* 1,185.6*   Basic Metabolic Panel: Recent Labs  Lab  04/09/23 0121 04/10/23 0159 04/11/23 0510 04/11/23 0944 04/11/23 0958 04/12/23 0537  NA 133* 136 137 140 137 136  K 4.5 3.8 3.6 3.4* 3.5 4.2  CL 102 102 101  --   --  104  CO2 21* 26 27  --   --  21*  GLUCOSE 349* 114* 299*  --   --  336*  BUN 23* 25* 22*  --   --  21*  CREATININE 1.17 1.26* 1.07  --   --  0.94  CALCIUM 8.8* 8.7* 8.4*  --   --  8.5*   Liver Function Tests: Recent Labs  Lab 04/09/23 0121  AST 29  ALT 40  ALKPHOS 90  BILITOT 1.9*  PROT 6.0*  ALBUMIN 3.0*   No results for input(s): "LIPASE", "AMYLASE" in the last 168 hours. No results for input(s): "AMMONIA" in the last 168 hours. CBC: Recent Labs  Lab 04/09/23 0121 04/10/23 0812 04/11/23 0510 04/11/23 0944 04/11/23 0958 04/12/23 0537  WBC 9.7 11.4* 8.8  --   --  7.9  NEUTROABS 7.3  --   --   --   --   --   HGB 15.4 15.0 14.5 15.0 15.0 14.6  HCT 46.5 43.1 42.4 44.0 44.0 43.8  MCV 88.2 85.3 84.6  --   --  87.4  PLT 215 212 215  --   --  217   Cardiac Enzymes: No results for input(s): "CKTOTAL", "CKMB", "CKMBINDEX", "TROPONINI" in the last 168 hours. BNP: Invalid input(s): "POCBNP" CBG: Recent Labs  Lab 04/11/23 0808 04/11/23 1059 04/11/23 1619 04/11/23 2117 04/12/23 0736  GLUCAP 243* 264* 333* 386* 299*   D-Dimer No results for input(s): "DDIMER" in the last 72 hours. Hgb A1c No results for input(s): "HGBA1C" in the last 72 hours.  Lipid Profile No results for input(s): "CHOL", "HDL", "LDLCALC", "TRIG", "CHOLHDL", "LDLDIRECT" in the last 72 hours. Thyroid function studies No results for input(s): "TSH", "T4TOTAL", "T3FREE", "THYROIDAB" in the last 72 hours.  Invalid input(s): "FREET3" Anemia work up No results for input(s): "VITAMINB12", "FOLATE", "FERRITIN", "TIBC", "IRON", "RETICCTPCT" in the last 72 hours. Urinalysis    Component Value Date/Time   COLORURINE YELLOW (A) 09/03/2022 0916   APPEARANCEUR CLEAR (A) 09/03/2022 0916   LABSPEC 1.025 09/03/2022 0916   PHURINE 5.0  09/03/2022 0916   GLUCOSEU >=500 (A) 09/03/2022 0916   HGBUR NEGATIVE 09/03/2022 0916   BILIRUBINUR NEGATIVE 09/03/2022 0916   KETONESUR NEGATIVE 09/03/2022 0916   PROTEINUR NEGATIVE 09/03/2022 0916   NITRITE NEGATIVE 09/03/2022 0916   LEUKOCYTESUR NEGATIVE 09/03/2022 0916   Sepsis Labs Recent Labs  Lab 04/09/23 0121 04/10/23 0812 04/11/23 0510 04/12/23 0537  WBC 9.7 11.4* 8.8 7.9   Microbiology No results found for this or any previous visit (from the past 240 hours). Imaging ECHOCARDIOGRAM COMPLETE Result Date: 04/09/2023    ECHOCARDIOGRAM REPORT   Patient Name:   DANIYAL TABOR Dini Date of Exam: 04/09/2023 Medical Rec #:  [id number]    Height:       69.0 in Accession #:    [id number]   Weight:       168.0 lb Date of Birth:  06-02-1965    BSA:          1.918 m Patient Age:    57 years     BP:           100/88 mmHg Patient Gender: M            HR:           100 bpm. Exam Location:  [facility] Procedure: 2D Echo, Cardiac Doppler, Color Doppler, Strain Analysis and 3D Echo            (Both Spectral and Color Flow Doppler were utilized during            procedure). Indications:     CHF-acute systolic  I50.21  History:         Patient has prior history of Echocardiogram examinations, most                  recent 09/04/2022. CAD, Stroke; Risk Factors:Diabetes and                  Hypertension.  Sonographer:     Cristela Blue Referring Phys:  [id number] VIPUL [facility] Diagnosing Phys: Windell Norfolk  Sonographer Comments: Global longitudinal strain was attempted. IMPRESSIONS  1. Large LV thrombus noted. Left ventricular ejection fraction, by estimation, is 10-15%%. The left ventricle has severely decreased function. The left ventricle demonstrates regional wall motion abnormalities (see scoring diagram/findings for description). The left ventricular internal cavity size was severely dilated. Left ventricular diastolic parameters are consistent with Grade II diastolic dysfunction (pseudonormalization).  2. Right ventricular  systolic function is mildly reduced. The right ventricular size is mildly enlarged. There is moderately elevated pulmonary artery systolic pressure. The estimated right ventricular systolic pressure is 45.4 mmHg.  3. Left atrial size was mildly dilated.  4. The mitral valve is normal in structure. Mild mitral valve regurgitation.  5. The aortic valve is tricuspid. There is mild thickening of the aortic valve. Aortic valve regurgitation is trivial. FINDINGS  Left Ventricle: Large LV thrombus noted. Left ventricular ejection fraction, by estimation, is 10-15%%. The left ventricle has severely decreased function. The left ventricle demonstrates regional wall motion abnormalities. The left ventricular internal  cavity size was severely dilated. There is no left ventricular hypertrophy. Left ventricular diastolic parameters are consistent with Grade II diastolic dysfunction (pseudonormalization).  LV Wall Scoring: The mid and distal anterior septum, entire apex, entire inferior wall, and mid inferoseptal segment are akinetic. The anterior wall, antero-lateral wall, posterior wall, basal anteroseptal segment, and basal inferoseptal segment are hypokinetic. Right Ventricle: The right ventricular size is mildly enlarged. No increase in right ventricular wall thickness. Right ventricular systolic function is mildly reduced. There is moderately elevated pulmonary artery systolic pressure. The tricuspid regurgitant velocity is 3.18 m/s, and with an assumed right atrial pressure of 5 mmHg, the estimated right ventricular systolic pressure is 45.4 mmHg. Left Atrium: Left atrial size was mildly dilated. Right Atrium: Right atrial size was normal in size. Pericardium: There is no evidence of pericardial effusion. Mitral Valve: The mitral valve is normal in structure. Mild mitral valve regurgitation. Tricuspid Valve: The tricuspid valve is normal in structure. Tricuspid valve regurgitation is mild. Aortic Valve: The aortic valve is  tricuspid. There is mild thickening of the aortic valve. Aortic valve regurgitation is trivial. Aortic valve mean gradient measures 1.0 mmHg. Aortic valve peak gradient measures 1.5 mmHg. Aortic valve area, by VTI measures 3.71 cm. Pulmonic Valve: The pulmonic valve was not well visualized. Pulmonic valve regurgitation is trivial. Aorta: The aortic root is normal in size and structure. IAS/Shunts: The interatrial septum was not well visualized.  LEFT VENTRICLE PLAX 2D LVIDd:         6.70 cm      Diastology LVIDs:         6.50 cm      LV e' medial:    4.68 cm/s LV PW:         1.10 cm      LV E/e' medial:  15.3 LV IVS:        0.80 cm      LV e' lateral:   4.57 cm/s LVOT diam:     2.20 cm  LV E/e' lateral: 15.7 LV SV:         26 LV SV Index:   14 LVOT Area:     3.80 cm  LV Volumes (MOD) LV vol d, MOD A2C: 202.0 ml LV vol d, MOD A4C: 279.0 ml LV vol s, MOD A2C: 189.0 ml LV vol s, MOD A4C: 259.0 ml LV SV MOD A2C:     13.0 ml LV SV MOD A4C:     279.0 ml LV SV MOD BP:      12.8 ml RIGHT VENTRICLE RV Basal diam:  4.60 cm RV Mid diam:    3.20 cm RV S prime:     8.16 cm/s TAPSE (M-mode): 1.3 cm LEFT ATRIUM             Index        RIGHT ATRIUM           Index LA diam:        4.40 cm 2.29 cm/m   RA Area:     16.30 cm LA Vol (A2C):   62.5 ml 32.58 ml/m  RA Volume:   45.60 ml  23.77 ml/m LA Vol (A4C):   36.3 ml 18.92 ml/m LA Biplane Vol: 49.7 ml 25.91 ml/m  AORTIC VALVE AV Area (Vmax):    3.38 cm AV Area (Vmean):   3.23 cm AV Area (VTI):     3.71 cm AV Vmax:           61.00 cm/s AV Vmean:          39.800 cm/s AV VTI:            0.071 m AV Peak Grad:      1.5 mmHg AV Mean Grad:      1.0 mmHg LVOT Vmax:         54.30 cm/s LVOT Vmean:        33.800 cm/s LVOT VTI:          0.070 m LVOT/AV VTI ratio: 0.98  AORTA Ao Root diam: 3.30 cm MITRAL VALVE               TRICUSPID VALVE MV Area (PHT): 6.83 cm    TR Peak grad:   40.4 mmHg MV Decel Time: 111 msec    TR Vmax:        318.00 cm/s MV E velocity: 71.80 cm/s MV A  velocity: 43.50 cm/s  SHUNTS MV E/A ratio:  1.65        Systemic VTI:  0.07 m                            Systemic Diam: 2.20 cm Windell Norfolk Electronically signed by Windell Norfolk Signature Date/Time: 04/09/2023/5:46:27 PM    Final    DG Chest Port 1 View Result Date: 04/09/2023 CLINICAL DATA:  Chest pain EXAM: PORTABLE CHEST 1 VIEW COMPARISON:  03/25/2023 FINDINGS: Cardiomegaly. No confluent airspace opacities, effusions or edema. No acute bony abnormality. IMPRESSION: Cardiomegaly.  No active disease. Electronically Signed   By: Charlett Nose M.D.   On: 04/09/2023 01:36      Time coordinating discharge: over 30 minutes  SIGNED:  Sunnie Nielsen DO [facility]

## 2023-04-12 NOTE — Progress Notes (Signed)
 [facility]CS EDUCATION NOTE:                VAD evaluation consent reviewed and signed by Allegra Grana.  Initial VAD teaching completed with pt and caregiver.   VAD educational packet including "Understanding Your Options with Advanced Heart Failure", "[place] Patient Agreement for VAD Evaluation and Potential Implantation" consent, and [employer] "Heartmate 3 Left Ventricular Device (LVAD) Patient Guide", "[place] HM III Patient Education", "[place] Mechanical Circulatory Support Program", and "Decision Aids for Left Ventricular Assist Device" reviewed in detail and left at bedside for continued reference.   All questions answered regarding VAD implant, hospital stay, and what to expect when discharged home living with a heart pump. Pt identified [name] as his primary caregiver if this therapy should be deemed appropriate for Kenneth [name].  Explained need for 24/7 care when pt is discharged home due to sternal precautions, adaptation to living on support, emotional support, consistent and meticulous exit site care and management, medication adherence and high volume of follow up visits with the VAD Clinic after discharge; both pt and caregiver verbalized understanding of above.   Explained that LVAD can be implanted for two indications in the setting of advanced left ventricular heart failure treatment:  Bridge to transplant - used for patients who cannot safely wait for heart transplant without this device.  Or    Destination therapy - used for patients until end of life or recovery of heart function.  Patient and caregiver(s) acknowledge that the indication at this point in time for LVAD therapy would be for destination therapy due to smoking.   Provided brief equipment overview and demonstration with HeartMate III training loop including discussion on the following:   a) mobile power unit b) system controller   c) universal Magazine features editor   d) battery clips   e) Batteries   f)  Perc lock   g)  Percutaneous lead   Demonstrated and discussed:  a) changing power source on system controller from tethered (MPU) to untethered (battery) mode   b) changing power source on system controller from untethered (battery) to tethered (MPU) mode   c) how to monitor battery life both on the system controller and on each individual battery   d) changing batteries   Reviewed and supplied a copy of home inspection check list stressing that only three pronged grounded power outlets can be used for VAD equipment. Melven confirmed home has electrical outlets that will support the equipment along with access working telephone.  Identified the following lifestyle modifications while living on MCS:    1. No driving for at least three months and then only if doctor gives permission to do so.   2. No tub baths while pump implanted, and shower only when doctor gives permission.   3. No swimming or submersion in water while implanted with pump.   4. No contact sports or engaging in jumping activities.   5. Always have a backup controller, charged spare batteries, and battery clips nearby at all times in case of emergency.   6. Call the doctor or hospital contact person if any change in how the pump sounds, feels, or works.   7. Plan to sleep only when connected to the power module.   8. Do not sleep on your stomach.   9. Keep a backup system controller, charged batteries, battery clips, and flashlight near you during sleep in case of electrical power outage.   10. Exit site care including dressing changes, monitoring for  infection, and importance of keeping percutaneous lead stabilized at all times.     Extended the option to have one of our current patients and caregiver(s) come to talk with them about living on support} to assist with decision making.   Reviewed pictures of VAD drive line, site care, dressing changes, and drive line stabilization including securement attachment device and abdominal binder.  Discussed with pt and family that they will be required to purchase dressing supplies as long as patient has the VAD in place.   Reinforced need for 24 hour/7 day week caregivers; pt designated [name] as caregiver. He will also need to abide by sternal precautions with no lifting >10lbs, pushing, pulling and will need assistance with adapting to new life style with VAD equipment and care.   Intermacs patient survival statistics through September 2024 reviewed with patient and caregiver as follows:    The patient understands that from this discussion it does not mean that they will receive the device, but that depends on an extensive evaluation process. The patient is aware of the fact that if at anytime they want to stop the evaluation process they can.  All questions have been answered at this time and contact information was provided should they encounter any further questions.  They are both agreeable at this time to the evaluation process and will move forward.    Total Session Time: 30 minutes  Alyce Pagan RN VAD Coordinator  Office: [phone]  24/7 Pager: [phone]

## 2023-04-12 NOTE — Progress Notes (Signed)
 PHARMACY - ANTICOAGULATION CONSULT NOTE  Pharmacy Consult for heparin infusion and warfarin Indication: Large LV thrombus   No Known Allergies  Patient Measurements: Height: 5\' 9"  (175.3 cm) Weight: 75.1 kg (165 lb 9.1 oz) IBW/kg (Calculated) : 70.7 Heparin Dosing Weight: 76.2 kg  Vital Signs: Temp: 98.7 F (37.1 C) (03/21 1338) BP: 108/82 (03/21 1338) Pulse Rate: 90 (03/21 1338)  Labs: Recent Labs    04/09/23 1924 04/10/23 0159 04/10/23 0159 04/10/23 6579 04/11/23 0510 04/11/23 0944 04/11/23 0383 04/11/23 2307 04/12/23 0537 04/12/23 0814 04/12/23 1349  HGB  --   --    < > 15.0 14.5 15.0 15.0  --  14.6  --   --   HCT  --   --    < > 43.1 42.4 44.0 44.0  --  43.8  --   --   PLT  --   --   --  212 215  --   --   --  217  --   --   APTT 32  --   --   --   --   --   --   --   --   --   --   LABPROT 14.7  --   --   --  15.5*  --   --   --  15.0  --   --   INR 1.1  --   --   --  1.2  --   --   --  1.2  --   --   HEPARINUNFRC  --  0.47   < > 0.50 0.42  --   --  0.26*  --  0.52 0.52  CREATININE  --  1.26*  --   --  1.07  --   --   --  0.94  --   --    < > = values in this interval not displayed.   Estimated Creatinine Clearance: 85.7 mL/min (by C-G formula based on SCr of 0.94 mg/dL).  Medical History: Past Medical History:  Diagnosis Date   Coronary artery disease    Diabetes mellitus without complication (HCC)    GERD (gastroesophageal reflux disease)    Hypertension    Stroke (cerebrum) (HCC)     Medications:  Scheduled:   aspirin EC  81 mg Oral Daily   atorvastatin  80 mg Oral q1800   busPIRone  5 mg Oral BID   digoxin  0.125 mg Oral Daily   insulin aspart  0-5 Units Subcutaneous QHS   insulin aspart  0-9 Units Subcutaneous TID WC   insulin aspart  3 Units Subcutaneous TID WC   insulin glargine  10 Units Subcutaneous Daily   losartan  12.5 mg Oral Daily   melatonin  2.5 mg Oral QHS   mometasone-formoterol  2 puff Inhalation BID   nicotine  21 mg  Transdermal Daily   pantoprazole  40 mg Oral Daily   spironolactone  12.5 mg Oral Daily   Assessment: 59 y.o. male with CAD s/p PCI in 08/2022, hypertension, GERD, type 2 diabetes, hyperlipidemia, and tobacco use who presented with worsening shortness of breath over several months. Large LV thrombus noted on ECHO. No chronic anticoagulation noted based on dispense record. Pt does not have insurance, so plan may be to send pt home on warfarin with enoxaparin bridge (goodrx at [employer] $58 for 10 syringes).    Results: Date Time HL INR Comments  3/19 0159 HL=0.47  Heparin therapeutic x 1,  INR 1.1 at baseline  3/19 0812 HL=0.50 1.1 Heparin therapeutic x 2  3/20 0510 0.42 1.2 Heparin therapeutic x 3  3/20    Resume after cathlab - transfer to [facility] for urgent LVAD workup  3/20 2307 0.26  Subtherapeutic  3/21 0814 0.52  Therapeutic.   3/21 1349 0.52      Plan:  Heparin level is therapeutic. Will continue heparin infusion at 1500 units/hr. Recheck heparin level and CBC with AM labs.    Paschal Dopp, PharmD, 04/12/2023 2:18 PM

## 2023-04-13 ENCOUNTER — Encounter (HOSPITAL_COMMUNITY): Payer: Self-pay | Admitting: Cardiology

## 2023-04-13 ENCOUNTER — Inpatient Hospital Stay (HOSPITAL_COMMUNITY): Payer: Self-pay

## 2023-04-13 DIAGNOSIS — Z01810 Encounter for preprocedural cardiovascular examination: Secondary | ICD-10-CM

## 2023-04-13 LAB — COOXEMETRY PANEL
Carboxyhemoglobin: 2.1 % — ABNORMAL HIGH (ref 0.5–1.5)
Carboxyhemoglobin: 1.5 % (ref 0.5–1.5)
Methemoglobin: <0.7 % (ref 0.0–1.5)
Methemoglobin: 0.7 % (ref 0.0–1.5)
O2 Saturation: 90.7 %
O2 Saturation: 57.6 %
Total hemoglobin: 15.3 g/dL (ref 12.0–16.0)
Total hemoglobin: 16 g/dL (ref 12.0–16.0)

## 2023-04-13 LAB — CBC
HCT: 44.5 % (ref 39.0–52.0)
Hemoglobin: 14.7 g/dL (ref 13.0–17.0)
MCH: 29.1 pg (ref 26.0–34.0)
MCHC: 33 g/dL (ref 30.0–36.0)
MCV: 88.1 fL (ref 80.0–100.0)
Platelets: 265 10*3/uL (ref 150–400)
RBC: 5.05 MIL/uL (ref 4.22–5.81)
RDW: 15.2 % (ref 11.5–15.5)
WBC: 9.1 10*3/uL (ref 4.0–10.5)
nRBC: 0 % (ref 0.0–0.2)

## 2023-04-13 LAB — HEPATITIS B CORE ANTIBODY, TOTAL: HEP B CORE AB: NEGATIVE

## 2023-04-13 LAB — BASIC METABOLIC PANEL
Anion gap: 11 (ref 5–15)
BUN: 20 mg/dL (ref 6–20)
CO2: 26 mmol/L (ref 22–32)
Calcium: 8.9 mg/dL (ref 8.9–10.3)
Chloride: 102 mmol/L (ref 98–111)
Creatinine, Ser: 1.25 mg/dL — ABNORMAL HIGH (ref 0.61–1.24)
GFR, Estimated: >60 mL/min (ref >60)
Glucose, Bld: 109 mg/dL — ABNORMAL HIGH (ref 70–99)
Potassium: 3.9 mmol/L (ref 3.5–5.1)
Sodium: 139 mmol/L (ref 135–145)

## 2023-04-13 LAB — GLUCOSE, CAPILLARY
Glucose-Capillary: 161 mg/dL — ABNORMAL HIGH (ref 70–99)
Glucose-Capillary: 329 mg/dL — ABNORMAL HIGH (ref 70–99)
Glucose-Capillary: 354 mg/dL — ABNORMAL HIGH (ref 70–99)
Glucose-Capillary: 136 mg/dL — ABNORMAL HIGH (ref 70–99)

## 2023-04-13 LAB — PSA: Prostatic Specific Antigen: 0.87 ng/mL (ref 0.00–4.00)

## 2023-04-13 LAB — HEPARIN LEVEL (UNFRACTIONATED)
Heparin Unfractionated: <0.1 [IU]/mL — ABNORMAL LOW (ref 0.30–0.70)
Heparin Unfractionated: 0.49 [IU]/mL (ref 0.30–0.70)

## 2023-04-13 LAB — T3, FREE: T3, Free: 2.5 pg/mL (ref 2.0–4.4)

## 2023-04-13 LAB — MAGNESIUM: Magnesium: 1.8 mg/dL (ref 1.7–2.4)

## 2023-04-13 LAB — HEPATITIS B SURFACE ANTIBODY, QUANTITATIVE: Hep B S AB Quant (Post): <3.5 m[IU]/mL — ABNORMAL LOW

## 2023-04-13 MED ORDER — INSULIN ASPART 100 UNIT/ML IJ SOLN
5.0000 [IU] | Freq: Three times a day (TID) | INTRAMUSCULAR | Status: DC
  Administered 2023-04-13 – 2023-04-14 (×2): 5 [IU] via SUBCUTANEOUS

## 2023-04-13 MED ORDER — MAGNESIUM SULFATE 2 GM/50ML IV SOLN
2.0000 g | Freq: Once | INTRAVENOUS | Status: AC
Start: 2023-04-13 — End: 2023-04-13
  Administered 2023-04-13: 2 g via INTRAVENOUS
  Filled 2023-04-13: qty 50

## 2023-04-13 MED ORDER — POTASSIUM CHLORIDE CRYS ER 20 MEQ PO TBCR
40.0000 meq | EXTENDED_RELEASE_TABLET | Freq: Once | ORAL | Status: AC
  Administered 2023-04-13: 40 meq via ORAL
  Filled 2023-04-13: qty 2

## 2023-04-13 NOTE — Progress Notes (Signed)
 VASCULAR LAB    Bilateral lower extremity venous duplex has been performed.  See CV proc for preliminary results.   Sleepy Eye Carmack, RVT 04/13/2023, 5:03 PM

## 2023-04-13 NOTE — Progress Notes (Signed)
 Advanced Heart Failure Rounding Note   Subjective:     Kenneth [name] is a 58 y.o. male with a PMH of CAD with PCI to the RCA and OM, chronic heart failure with reduced ejection fraction, diabetes, hyperlipidemia, hypertension, prior CVA, tobacco use who was admitted to [facility] w/ acute on chronic systolic heart failure w/ NYHA IV symptoms.  Has had progressive decline in functional status/exercise tolerance over the last year.     Echo LVEF 10-15%, severely dilated LV (6.7 LVIDD) w/ large LV thrombus. RV mildly reduced.    LHC 8/24:  LM ok LAD minor irregs LCx 100% large OM1 -> PCI  RCA CTO prox RHC 04/11/23 RA 8 PA 53/26 (36) PW 26 PVR 3.1 Fick 3.2/1.7 PAPi 2.1  On milrinone 0.25 Co-ox 58% CVP 7  Diuresing on IV lasix. Out 3L  Breathing better. Denies CP. Anxious about VAD   Objective:   Weight Range:  Vital Signs:   Temp:  [98.2 F (36.8 C)-98.7 F (37.1 C)] 98.3 F (36.8 C) (03/22 0715) Pulse Rate:  [80-96] 88 (03/22 0715) Resp:  [17-20] 17 (03/22 0524) BP: (98-114)/(54-84) 98/54 (03/22 0715) SpO2:  [95 %-99 %] 95 % (03/22 0715) Weight:  [68.4 kg-72.9 kg] 68.4 kg (03/22 0524) Last BM Date : 04/11/23  Weight change: Filed Weights   04/12/23 1749 04/13/23 0524  Weight: 72.9 kg 68.4 kg    Intake/Output:   Intake/Output Summary (Last 24 hours) at 04/13/2023 1046 Last data filed at 04/13/2023 0947 Gross per 24 hour  Intake 239 ml  Output 3050 ml  Net -2811 ml     Physical Exam: General:  Well appearing. No resp difficulty HEENT: normal Neck: supple. JVP 7-8 Carotids 2+ bilat; no bruits. No lymphadenopathy or thryomegaly appreciated. Cor: Regular rate & rhythm. No rubs, gallops or murmurs. Lungs: clear Abdomen: soft, nontender, nondistended. No hepatosplenomegaly. No bruits or masses. Good bowel sounds. Extremities: no cyanosis, clubbing, rash, edema Neuro: alert & orientedx3, cranial nerves grossly intact. moves all 4 extremities w/o difficulty. Affect  pleasant   Telemetry: NSR 80s Personally reviewed   Labs: Basic Metabolic Panel: Recent Labs  Lab 04/09/23 0121 04/10/23 0159 04/11/23 0510 04/11/23 0944 04/11/23 0958 04/12/23 0537 04/12/23 1702 04/13/23 0500  NA 133* 136 137 140 137 136  --  139  K 4.5 3.8 3.6 3.4* 3.5 4.2  --  3.9  CL 102 102 101  --   --  104  --  102  CO2 21* 26 27  --   --  21*  --  26  GLUCOSE 349* 114* 299*  --   --  336*  --  109*  BUN 23* 25* 22*  --   --  21*  --  20  CREATININE 1.17 1.26* 1.07  --   --  0.94  --  1.25*  CALCIUM 8.8* 8.7* 8.4*  --   --  8.5*  --  8.9  MG  --   --   --   --   --   --  1.9 1.8    Liver Function Tests: Recent Labs  Lab 04/09/23 0121  AST 29  ALT 40  ALKPHOS 90  BILITOT 1.9*  PROT 6.0*  ALBUMIN 3.0*   No results for input(s): "LIPASE", "AMYLASE" in the last 168 hours. No results for input(s): "AMMONIA" in the last 168 hours.  CBC: Recent Labs  Lab 04/09/23 0121 04/10/23 0865 04/11/23 0510 04/11/23 7846 04/11/23 9629 04/12/23 0537 04/13/23  0500  WBC 9.7 11.4* 8.8  --   --  7.9 9.1  NEUTROABS 7.3  --   --   --   --   --   --   HGB 15.4 15.0 14.5 15.0 15.0 14.6 14.7  HCT 46.5 43.1 42.4 44.0 44.0 43.8 44.5  MCV 88.2 85.3 84.6  --   --  87.4 88.1  PLT 215 212 215  --   --  217 265    Cardiac Enzymes: No results for input(s): "CKTOTAL", "CKMB", "CKMBINDEX", "TROPONINI" in the last 168 hours.  BNP: BNP (last 3 results) Recent Labs    02/18/23 1348 04/09/23 0121  BNP 697.3* 1,185.6*    ProBNP (last 3 results) No results for input(s): "PROBNP" in the last 8760 hours.    Other results:  Imaging: CT ABDOMEN PELVIS W CONTRAST Result Date: 04/12/2023 CLINICAL DATA:  Preop for potential LVAD. EXAM: CT ABDOMEN AND PELVIS WITH CONTRAST TECHNIQUE: Multidetector CT imaging of the abdomen and pelvis was performed using the standard protocol following bolus administration of intravenous contrast. RADIATION DOSE REDUCTION: This exam was performed  according to the departmental dose-optimization program which includes automated exposure control, adjustment of the mA and/or kV according to patient size and/or use of iterative reconstruction technique. CONTRAST:  60mL OMNIPAQUE IOHEXOL 350 MG/ML SOLN COMPARISON:  None Available. FINDINGS: Lower chest: The heart is enlarged. Mild septal thickening at the lung bases may represent edema. Trace right pleural effusion. Hepatobiliary: No focal liver abnormality. Liver is normal in size. The gallbladder is nondistended. No calcified gallstone. No biliary dilatation. Pancreas: No ductal dilatation or inflammation. Spleen: Normal in size without focal abnormality. Adrenals/Urinary Tract: Normal adrenal glands. No hydronephrosis or renal calculi. No suspicious renal lesion. Partially distended urinary bladder, mildly thick walled. Stomach/Bowel: Mild wall thickening at the gastroesophageal junction. The stomach is physiologically distended. There is a duodenal diverticulum. Fecalization of distal small bowel contents. The appendix is normal. Moderate colonic stool burden. Left colonic diverticulosis without diverticulitis. Vascular/Lymphatic: Moderate aortic and branch atherosclerosis. Circumferential noncalcified plaque throughout the abdominal aorta. 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 no enlarged lymph nodes in the abdomen or pelvis. Reproductive: Enlarged prostate gland. Other: Small volume abdominopelvic ascites. No free air. Mild generalized body wall edema. Musculoskeletal: There are no acute or suspicious osseous abnormalities. Lower lumbar facet hypertrophy. IMPRESSION: 1. Cardiomegaly with mild septal thickening at the lung bases, trace right pleural effusion, and small volume abdominopelvic ascites. Findings suggest CHF. 2. Colonic diverticulosis without diverticulitis. 3. Enlarged prostate gland. Mild wall thickening of the urinary bladder,  may be due to chronic outlet obstruction. Electronically Signed   By: Narda Rutherford M.D.   On: 04/12/2023 22:13   DG Orthopantogram Result Date: 04/12/2023 CLINICAL DATA:  [id number] Preoperative evaluation to rule out surgical contraindication [id number] EXAM: ORTHOPANTOGRAM/PANORAMIC COMPARISON:  None Available. FINDINGS: No fracture significant osseous abnormality. No retained radiopaque foreign body. Missing left mandibular molar. Well-aerated partially visualized maxillary sinuses. IMPRESSION: Negative. Electronically Signed   By: Tish Frederickson M.D.   On: 04/12/2023 21:27   [place] EKG SITE RITE Result Date: 04/12/2023 If Site Rite image not attached, placement could not be confirmed due to current cardiac rhythm.    Medications:     Scheduled Medications:  amiodarone  200 mg Oral BID   aspirin  81 mg Oral Daily   atorvastatin  80 mg Oral Daily   busPIRone  5 mg Oral BID   Chlorhexidine  Gluconate Cloth  6 each Topical Daily   digoxin  0.125 mg Oral Daily   furosemide  60 mg Intravenous BID   insulin aspart  0-5 Units Subcutaneous QHS   insulin aspart  0-9 Units Subcutaneous TID WC   insulin aspart  3 Units Subcutaneous TID WC   insulin glargine  10 Units Subcutaneous Daily   losartan  12.5 mg Oral Daily   melatonin  2.5 mg Oral QHS   pantoprazole  40 mg Oral Daily   sodium chloride flush  10-40 mL Intracatheter Q12H   sodium chloride flush  3 mL Intravenous Q12H   spironolactone  12.5 mg Oral Daily    Infusions:  sodium chloride     heparin 1,600 Units/hr (04/13/23 0745)   milrinone 0.25 mcg/kg/min (04/13/23 0611)    PRN Medications: sodium chloride, sodium chloride flush, sodium chloride flush   Assessment/Plan:   1. Acute on chronic systolic CHF: ?Primarily ischemic cardiomyopathy.  Echo in 8/24 at time of last NSTEMI with EF 25-30%.  Echo this admission with EF 10-15%, severe LV dilation with LV thrombus, mild RV dysfunction.  His cardiomyopathy does seem out of  proportion to his coronary disease.  He does not drink ETOH or use drugs.  No family history of dilated cardiomyopathy (FH of CAD).  He does have uncontrolled DM as well as a chronic wide LBBB (176 msec) which could contribute. Also noted to have frequent PVCs.  PCWP elevated on RHC yesterday, CI low at 1.67.   - Milrinone 0.25 mcg/kg/min Co-ox 58% - Remains volume overloaded.  - Continue  Lasix 60 mg IV bid for at least one more day  - digoxin 0.125 daily.  - losartan 12.5 daily - spironolactone 12.5 daily.  - Hold Toprol XL for now with low output.  - We have started discussion of LVAD with Mr Marsico.  He has low output HF with quite low EF.  Not a transplant candidate at this time with active smoking prior to admission.  Barriers for LVAD will be poor DM control (hgbA1c 12) and lack of insurance.   As above, his cardiomyopathy appears out of proportion to CAD. Also has wide LBBB + uncontrolled DM + PVCs may all play a role. Pending cardiac MRI.  If he does not have a large amount of scar in his LV myocardium, consider diuresis, aggressive med adjustment and wean from milrinone with CRT placement in the near future (?can he avoid LVAD). If he has significant LV myocardial scarring, CRT may be less likely to help and he would be more likely to need LVAD placement. 2. LV thrombus: Heparin gtt.  3. PVCs: Patient noted to have frequent PVCs on telemetry.   - With addition of milrinone, now on amiodarone 200 mg po bid.  May need to transition to IV if he has NSVT runs or very frequent PVCs.  4. CAD: Remote PCI to RCA, cath in 8/24 showed chronically occluded proximal RCA.  NSTEMI in 8/24 with occluded OM1 treated with DES.  LAD with minimal disease.  No chest pain currently.  HS-TnI not significantly elevated, no ACS this admission.  - He is now off Plavix (> 6 months post PCI).  - Continue ASA 81.  - Continue statin.  5. DM2: Poor control, hgbA1c 12.   - Diabetes coordinator consult. - SSI.  6.  Smoking: He has quit 7. Will need social worker to help [place] with getting insurance set up.   Long discussion today about potential need  for VAD. He is interested in this. We discussed potential barriers and MRB process - continue milrinone and lasix - Await MRI - continue IV heparin  Length of Stay: 1   Arvilla Meres MD 04/13/2023, 10:46 AM  Advanced Heart Failure Team Pager [phone] (M-F; 7a - 4p)  Please contact [facility] for night-coverage after hours (4p -7a ) and weekends on [email or web address]

## 2023-04-13 NOTE — Progress Notes (Signed)
 VASCULAR LAB    Pre VAD Dopplers have been performed.  See CV proc for preliminary results.   Alonna Bartling, RVT 04/13/2023, 5:03 PM

## 2023-04-13 NOTE — Progress Notes (Signed)
 PHARMACY - ANTICOAGULATION CONSULT NOTE  Pharmacy Consult for heparin infusion Indication: Large LV thrombus   No Known Allergies  Patient Measurements: Height: 5\' 9"  (175.3 cm) Weight: 68.4 kg (150 lb 12.8 oz) IBW/kg (Calculated) : 70.7 Heparin Dosing Weight: 76.2 kg  Vital Signs: Temp: 98.3 F (36.8 C) (03/22 1730) Temp Source: Oral (03/22 1730) BP: 105/63 (03/22 1108) Pulse Rate: 92 (03/22 1108)  Labs: Recent Labs    04/11/23 0510 04/11/23 0944 04/11/23 0958 04/11/23 2307 04/12/23 0537 04/12/23 0814 04/12/23 1349 04/12/23 1702 04/13/23 0500 04/13/23 1700  HGB 14.5   < > 15.0  --  14.6  --   --   --  14.7  --   HCT 42.4   < > 44.0  --  43.8  --   --   --  44.5  --   PLT 215  --   --   --  217  --   --   --  265  --   APTT  --   --   --   --   --   --   --  98*  --   --   LABPROT 15.5*  --   --   --  15.0  --   --  14.4  --   --   INR 1.2  --   --   --  1.2  --   --  1.1  --   --   HEPARINUNFRC 0.42  --   --    < >  --    < > 0.52  --  <0.10* 0.49  CREATININE 1.07  --   --   --  0.94  --   --   --  1.25*  --    < > = values in this interval not displayed.   Estimated Creatinine Clearance: 62.3 mL/min (A) (by C-G formula based on SCr of 1.25 mg/dL (H)).  Medical History: Past Medical History:  Diagnosis Date   Coronary artery disease    Diabetes mellitus without complication (HCC)    GERD (gastroesophageal reflux disease)    Hypertension    Stroke (cerebrum) (HCC)     Medications:  Scheduled:   amiodarone  200 mg Oral BID   aspirin  81 mg Oral Daily   atorvastatin  80 mg Oral Daily   busPIRone  5 mg Oral BID   Chlorhexidine Gluconate Cloth  6 each Topical Daily   digoxin  0.125 mg Oral Daily   furosemide  60 mg Intravenous BID   insulin aspart  0-5 Units Subcutaneous QHS   insulin aspart  0-9 Units Subcutaneous TID WC   insulin aspart  5 Units Subcutaneous TID WC   insulin glargine  10 Units Subcutaneous Daily   losartan  12.5 mg Oral Daily    melatonin  2.5 mg Oral QHS   pantoprazole  40 mg Oral Daily   sodium chloride flush  10-40 mL Intracatheter Q12H   sodium chloride flush  3 mL Intravenous Q12H   spironolactone  12.5 mg Oral Daily   Assessment: 58 y.o. male with CAD s/p PCI in 08/2022, hypertension, GERD, type 2 diabetes, hyperlipidemia, and tobacco use who presented with worsening shortness of breath over several months. Large LV thrombus noted on ECHO. No chronic anticoagulation noted based on dispense record. Pt does not have insurance.   Heparin level came back at 0.49 tonight. Cont current rate and f/u in AM.   Plan:  Continue  IV heparin 1600 units/hr. Daily heparin level and CBC.  Ulyses Southward, PharmD, BCIDP, AAHIVP, CPP Infectious Disease Pharmacist 04/13/2023 5:58 PM

## 2023-04-13 NOTE — Progress Notes (Signed)
 PHARMACY - ANTICOAGULATION CONSULT NOTE  Pharmacy Consult for heparin infusion Indication: Large LV thrombus   No Known Allergies  Patient Measurements: Height: 5\' 9"  (175.3 cm) Weight: 68.4 kg (150 lb 12.8 oz) IBW/kg (Calculated) : 70.7 Heparin Dosing Weight: 76.2 kg  Vital Signs: Temp: 98.3 F (36.8 C) (03/22 0715) Temp Source: Oral (03/22 0715) BP: 98/54 (03/22 0715) Pulse Rate: 88 (03/22 0715)  Labs: Recent Labs    04/11/23 0510 04/11/23 0944 04/11/23 0958 04/11/23 2307 04/12/23 0537 04/12/23 0814 04/12/23 1349 04/12/23 1702 04/13/23 0500  HGB 14.5   < > 15.0  --  14.6  --   --   --  14.7  HCT 42.4   < > 44.0  --  43.8  --   --   --  44.5  PLT 215  --   --   --  217  --   --   --  265  APTT  --   --   --   --   --   --   --  98*  --   LABPROT 15.5*  --   --   --  15.0  --   --  14.4  --   INR 1.2  --   --   --  1.2  --   --  1.1  --   HEPARINUNFRC 0.42  --   --    < >  --  0.52 0.52  --  <0.10*  CREATININE 1.07  --   --   --  0.94  --   --   --  1.25*   < > = values in this interval not displayed.   Estimated Creatinine Clearance: 62.3 mL/min (A) (by C-G formula based on SCr of 1.25 mg/dL (H)).  Medical History: Past Medical History:  Diagnosis Date   Coronary artery disease    Diabetes mellitus without complication (HCC)    GERD (gastroesophageal reflux disease)    Hypertension    Stroke (cerebrum) (HCC)     Medications:  Scheduled:   amiodarone  200 mg Oral BID   aspirin  81 mg Oral Daily   atorvastatin  80 mg Oral Daily   busPIRone  5 mg Oral BID   Chlorhexidine Gluconate Cloth  6 each Topical Daily   digoxin  0.125 mg Oral Daily   furosemide  60 mg Intravenous BID   insulin aspart  0-5 Units Subcutaneous QHS   insulin aspart  0-9 Units Subcutaneous TID WC   insulin aspart  3 Units Subcutaneous TID WC   insulin glargine  10 Units Subcutaneous Daily   losartan  12.5 mg Oral Daily   melatonin  2.5 mg Oral QHS   pantoprazole  40 mg Oral Daily    sodium chloride flush  10-40 mL Intracatheter Q12H   sodium chloride flush  3 mL Intravenous Q12H   spironolactone  12.5 mg Oral Daily   Assessment: 58 y.o. male with CAD s/p PCI in 08/2022, hypertension, GERD, type 2 diabetes, hyperlipidemia, and tobacco use who presented with worsening shortness of breath over several months. Large LV thrombus noted on ECHO. No chronic anticoagulation noted based on dispense record. Pt does not have insurance.   Heparin level this morning is undetectable.  Discussed with RN, no known issues with IV infusion, no overt bleeding or complications noted.  Heparin running peripherally and levels drawn from CVP line.  Plan:  Increase IV heparin to 1600 units/hr. Repeat heparin level in 6  hrs Daily heparin level and CBC.  Reece Leader, Colon Flattery, BCCP Clinical Pharmacist  04/13/2023 7:26 AM   [facility] pharmacy phone numbers are listed on [email or web address]

## 2023-04-13 NOTE — Inpatient Diabetes Management (Addendum)
 Inpatient Diabetes Program Recommendations  AACE/ADA: New Consensus Statement on Inpatient Glycemic Control   Target Ranges:  Prepandial:   less than 140 mg/dL      Peak postprandial:   less than 180 mg/dL (1-2 hours)      Critically ill patients:  140 - 180 mg/dL    Latest Reference Range & Units 04/12/23 07:36 04/12/23 12:30 04/12/23 16:42 04/12/23 21:12 04/13/23 06:46  Glucose-Capillary 70 - 99 mg/dL 829 (H) 562 (H) 130 (H) 100 (H) 161 (H)    Latest Reference Range & Units 09/03/22 07:59 04/09/23 07:41  Hemoglobin A1C 4.8 - 5.6 % 11.4 (H) 12.0 (H)   Review of Glycemic Control  Diabetes history: DM2 Outpatient Diabetes medications: Metformin 1000 mg BID (recently resumed taking 1-2 weeks ago), Amaryl 4 mg daily (not taking) Current orders for Inpatient glycemic control: Lantus 10 units daily, Novolog 0-9 units TID with meals, Novolog 0-5 units at bedtime, Novolog 3 units TID with meals  Inpatient Diabetes Program Recommendations:    Insulin: Please consider increasing meal coverage to Novolog 5 units TID with meals if patient eats at least 50% of meals.  NOTE: Noted consult for Diabetes Coordinator. Diabetes Coordinator is not on campus over the weekend but available by pager from 8am to 5pm for questions or concerns. Inpatient diabetes coordinator spoke with patient on 04/10/23 while at [facility] regarding DM control and A1C. At that time, patient was not willing to take insulin outpatient. Inpatient diabetes team will follow up with patient on Monday 04/15/23 to talk with patient again about improving DM control and likely needing insulin outpatient.   Thanks, Orlando Penner, RN, MSN, CDCES Diabetes Coordinator Inpatient Diabetes Program [phone] (Team Pager from 8am to 5pm)

## 2023-04-14 ENCOUNTER — Encounter (HOSPITAL_COMMUNITY): Payer: Self-pay | Admitting: Cardiology

## 2023-04-14 DIAGNOSIS — Z7189 Other specified counseling: Secondary | ICD-10-CM

## 2023-04-14 DIAGNOSIS — Z515 Encounter for palliative care: Secondary | ICD-10-CM

## 2023-04-14 LAB — BASIC METABOLIC PANEL
Anion gap: 10 (ref 5–15)
BUN: 25 mg/dL — ABNORMAL HIGH (ref 6–20)
CO2: 27 mmol/L (ref 22–32)
Calcium: 9.1 mg/dL (ref 8.9–10.3)
Chloride: 97 mmol/L — ABNORMAL LOW (ref 98–111)
Creatinine, Ser: 1.27 mg/dL — ABNORMAL HIGH (ref 0.61–1.24)
GFR, Estimated: >60 mL/min (ref >60)
Glucose, Bld: 323 mg/dL — ABNORMAL HIGH (ref 70–99)
Potassium: 4 mmol/L (ref 3.5–5.1)
Sodium: 134 mmol/L — ABNORMAL LOW (ref 135–145)

## 2023-04-14 LAB — MAGNESIUM: Magnesium: 2 mg/dL (ref 1.7–2.4)

## 2023-04-14 LAB — GLUCOSE, CAPILLARY
Glucose-Capillary: 372 mg/dL — ABNORMAL HIGH (ref 70–99)
Glucose-Capillary: 181 mg/dL — ABNORMAL HIGH (ref 70–99)
Glucose-Capillary: 287 mg/dL — ABNORMAL HIGH (ref 70–99)
Glucose-Capillary: 185 mg/dL — ABNORMAL HIGH (ref 70–99)

## 2023-04-14 LAB — CBC
HCT: 49.8 % (ref 39.0–52.0)
Hemoglobin: 16.4 g/dL (ref 13.0–17.0)
MCH: 29.1 pg (ref 26.0–34.0)
MCHC: 32.9 g/dL (ref 30.0–36.0)
MCV: 88.5 fL (ref 80.0–100.0)
Platelets: 304 10*3/uL (ref 150–400)
RBC: 5.63 MIL/uL (ref 4.22–5.81)
RDW: 15.2 % (ref 11.5–15.5)
WBC: 8.8 10*3/uL (ref 4.0–10.5)
nRBC: 0 % (ref 0.0–0.2)

## 2023-04-14 LAB — COOXEMETRY PANEL
Carboxyhemoglobin: 0.6 % (ref 0.5–1.5)
Methemoglobin: <0.7 % (ref 0.0–1.5)
O2 Saturation: 62.3 %
Total hemoglobin: 16.3 g/dL — ABNORMAL HIGH (ref 12.0–16.0)

## 2023-04-14 LAB — VAS US DOPPLER PRE VAD

## 2023-04-14 LAB — HEPARIN LEVEL (UNFRACTIONATED): Heparin Unfractionated: 0.47 [IU]/mL (ref 0.30–0.70)

## 2023-04-14 MED ORDER — INSULIN GLARGINE 100 UNIT/ML ~~LOC~~ SOLN
13.0000 [IU] | Freq: Every day | SUBCUTANEOUS | Status: DC
  Administered 2023-04-14 – 2023-04-15 (×2): 13 [IU] via SUBCUTANEOUS
  Filled 2023-04-14 (×2): qty 0.13

## 2023-04-14 MED ORDER — POTASSIUM CHLORIDE CRYS ER 20 MEQ PO TBCR
40.0000 meq | EXTENDED_RELEASE_TABLET | Freq: Once | ORAL | Status: AC
  Administered 2023-04-14: 40 meq via ORAL
  Filled 2023-04-14: qty 2

## 2023-04-14 MED ORDER — ADULT MULTIVITAMIN W/MINERALS CH
1.0000 | ORAL_TABLET | Freq: Every day | ORAL | Status: DC
  Administered 2023-04-14 – 2023-04-25 (×12): 1 via ORAL
  Filled 2023-04-14 (×12): qty 1

## 2023-04-14 MED ORDER — INSULIN ASPART 100 UNIT/ML IJ SOLN
7.0000 [IU] | Freq: Three times a day (TID) | INTRAMUSCULAR | Status: DC
  Administered 2023-04-14 – 2023-04-16 (×6): 7 [IU] via SUBCUTANEOUS

## 2023-04-14 MED ORDER — INSULIN ASPART 100 UNIT/ML IJ SOLN
0.0000 [IU] | INTRAMUSCULAR | Status: DC
  Administered 2023-04-14: 5 [IU] via SUBCUTANEOUS
  Administered 2023-04-14 (×2): 2 [IU] via SUBCUTANEOUS
  Administered 2023-04-15: 3 [IU] via SUBCUTANEOUS
  Administered 2023-04-15: 5 [IU] via SUBCUTANEOUS
  Administered 2023-04-15 – 2023-04-16 (×3): 3 [IU] via SUBCUTANEOUS
  Administered 2023-04-16: 2 [IU] via SUBCUTANEOUS
  Administered 2023-04-16: 3 [IU] via SUBCUTANEOUS

## 2023-04-14 NOTE — Progress Notes (Signed)
 Initial Nutrition Assessment  DOCUMENTATION CODES:   Not applicable  INTERVENTION:  Multivitamin w/ minerals daily Encourage good PO intake Double protein with all meals  NUTRITION DIAGNOSIS:   Increased nutrient needs related to chronic illness (CHF) as evidenced by estimated needs.  GOAL:   Patient will meet greater than or equal to 90% of their needs  MONITOR:   PO intake, Labs, Weight trends, I & O's  REASON FOR ASSESSMENT:   Consult Assessment of nutrition requirement/status  ASSESSMENT:   58 y.o. male presented to the ED with L sided chest pain. PMH includes T2DM, tobacco use, NSTEMI, CVA, HTN, HLD, and CAD. Pt admitted with acute on chronic CHF.   3/18 - Admitted to [facility] 3/21 - transferred to [facility] for LVAD evaluation  RD working remotely at time of assessment.  Spoke with pt via phone. Pt reports that he is eating very well. States that at home he had a great appetite and never missed a meal. Shares his wife did a lot of the cooking and was good at it. Shared that he was eating grilled chicken at home. Discussed adding double protein to ensure that pt is getting in good nutrition to maintain lean muscle mass and prevent weight loss; pt agreeable and appreciative.   Pt shares that he has lost weight, but believes that it was from fluid. Per EMR, pt with a 12.3% weight loss within the past 7 months.   Meal Intakes 3/23: 25-100% x 2 meals  Medications reviewed and include: NovoLog SSI + 7 units TID, Lantus 13 units, Melatonin, Protonix, Spironolactone Labs reviewed: Sodium 134, Potassium 4.0, BUN 25, Creatinine 1.27, Magnesium 2.0, Hgb A1c 12.0 CBG: 136-372 x 24 hrs  UOP: 1025 mL + 2 unmeasured occurrences  NUTRITION - FOCUSED PHYSICAL EXAM:  Deferred to follow-up.  Diet Order:   Diet Order             Diet heart healthy/carb modified Room service appropriate? Yes; Fluid consistency: Thin; Fluid restriction: 1500 mL Fluid  Diet effective now                    EDUCATION NEEDS:   No education needs have been identified at this time  Skin:  Skin Assessment: Reviewed RN Assessment  Last BM:  3/22  Height:  Ht Readings from Last 1 Encounters:  04/12/23 5\' 9"  (1.753 m)   Weight:  Wt Readings from Last 1 Encounters:  04/14/23 67.6 kg   Ideal Body Weight:  72.7 kg  BMI:  Body mass index is 22.01 kg/m.  Estimated Nutritional Needs:  Kcal:  2100-2300 Protein:  105-125 grams Fluid:  >/= 1.5 L   Kirby Crigler RD, LDN Clinical Dietitian

## 2023-04-14 NOTE — Progress Notes (Signed)
 PHARMACY - ANTICOAGULATION CONSULT NOTE  Pharmacy Consult for heparin infusion Indication: Large LV thrombus   No Known Allergies  Patient Measurements: Height: 5\' 9"  (175.3 cm) Weight: 67.6 kg (149 lb 0.5 oz) IBW/kg (Calculated) : 70.7 Heparin Dosing Weight: 76.2 kg  Vital Signs: Temp: 97.6 F (36.4 C) (03/23 0750) Temp Source: Oral (03/23 0750) BP: 110/74 (03/23 0750) Pulse Rate: 85 (03/23 0750)  Labs: Recent Labs    04/12/23 0537 04/12/23 0814 04/12/23 1702 04/13/23 0500 04/13/23 1700 04/14/23 0450  HGB 14.6  --   --  14.7  --  16.4  HCT 43.8  --   --  44.5  --  49.8  PLT 217  --   --  265  --  304  APTT  --   --  98*  --   --   --   LABPROT 15.0  --  14.4  --   --   --   INR 1.2  --  1.1  --   --   --   HEPARINUNFRC  --    < >  --  <0.10* 0.49 0.47  CREATININE 0.94  --   --  1.25*  --  1.27*   < > = values in this interval not displayed.   Estimated Creatinine Clearance: 60.6 mL/min (A) (by C-G formula based on SCr of 1.27 mg/dL (H)).  Medical History: Past Medical History:  Diagnosis Date   Coronary artery disease    Diabetes mellitus without complication (HCC)    GERD (gastroesophageal reflux disease)    Hypertension    Stroke (cerebrum) (HCC)     Medications:  Scheduled:   amiodarone  200 mg Oral BID   aspirin  81 mg Oral Daily   atorvastatin  80 mg Oral Daily   busPIRone  5 mg Oral BID   Chlorhexidine Gluconate Cloth  6 each Topical Daily   digoxin  0.125 mg Oral Daily   furosemide  60 mg Intravenous BID   insulin aspart  0-5 Units Subcutaneous QHS   insulin aspart  0-9 Units Subcutaneous Q4H   insulin aspart  7 Units Subcutaneous TID WC   insulin glargine  13 Units Subcutaneous Daily   losartan  12.5 mg Oral Daily   melatonin  2.5 mg Oral QHS   pantoprazole  40 mg Oral Daily   sodium chloride flush  10-40 mL Intracatheter Q12H   sodium chloride flush  3 mL Intravenous Q12H   spironolactone  12.5 mg Oral Daily   Assessment: 57 y.o. male  with CAD s/p PCI in 08/2022, hypertension, GERD, type 2 diabetes, hyperlipidemia, and tobacco use who presented with worsening shortness of breath over several months. Large LV thrombus noted on ECHO. No chronic anticoagulation noted based on dispense record. Pt does not have insurance.   Heparin level 0.47 this morning. No overt bleeding or complications noted.  Plan:  Continue IV heparin 1600 units/hr. Daily heparin level and CBC.  Reece Leader, Colon Flattery, [facility] Clinical Pharmacist  04/14/2023 8:17 AM   [facility] pharmacy phone numbers are listed on [email or web address]

## 2023-04-14 NOTE — Consult Note (Signed)
 Palliative Medicine Inpatient Consult Note  Consulting Provider:  Dolores Patty, MD   Reason for consult:   Palliative Care Consult Services Palliative Medicine Consult  Reason for Consult? LVAD evaluation   04/14/2023  HPI:  Per intake H&P --> Kenneth [name] is a 57 y.o. male with a PMH of CAD with PCI to the RCA and OM, chronic heart failure with reduced ejection fraction, diabetes, hyperlipidemia, hypertension, prior CVA, tobacco use who was admitted to [facility] w/ acute on chronic systolic heart failure w/ NYHA IV symptoms. Has had progressive decline in functional status/exercise tolerance over the last year. The PMT has been asked to support LVAD evaluation.   Clinical Assessment/Goals of Care:  *Please note that this is a verbal dictation therefore any spelling or grammatical errors are due to the "Dragon Medical One" system interpretation.  I have reviewed medical records including EPIC notes, labs and imaging, received report from bedside RN, assessed the patient who is lying in bed in NAD.    I met with Kenneth [name] to further discuss diagnosis prognosis, GOC, EOL wishes, disposition and options.   I introduced Palliative Medicine as specialized medical care for people living with serious illness. It focuses on providing relief from the symptoms and stress of a serious illness. The goal is to improve quality of life for both the patient and the family.  Medical History Review and Understanding:  A review of Kenneth [name]'s past medical history significant for CVA in 2019, hypertension, coronary artery disease, type 2 diabetes, and heart failure was completed.  Social History:  Kenneth [name] shares that he is from [facility].  He is married to his spouse, Kenneth [name].  He has 3 sons and 6 grandchildren.  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  Functional and Nutritional State:  Kenneth [name] shares that prior to hospitalization he was extremely strong and well-functioning.  He was independent of all his B ADLs and IADLs.  He had a good appetite.  Advance Directives:  A detailed discussion was had today regarding advanced directives.  We reviewed the importance of advance care planning and considering long-term wishes.  A blank copy of advance directive documents was provided for review and completion.  Code Status:  Concepts specific to code status, artifical feeding and hydration, continued IV antibiotics and rehospitalization was had.  The difference between a aggressive medical intervention path  and a palliative comfort care path for this patient at this time was had.   Kenneth [name] notes he would want all efforts made to save and preserve life.  Discussion:  Kenneth [name] and I reviewed the prior complexities he has had in the setting of his health.  He notes that in 2019 he had a severe stroke whereby he thought he would not be functional again.  He shares one of his greatest triumph's was when he was able to both cut the grass on his own for the first time as well as shower for the first time thereafter.  He expresses that it took him a year to get to a point of recovery.  We discussed Kenneth [name]'s advanced heart failure and the short and long-term concerns associated with this.  Kenneth [name] notes that he has been spoken to by the advanced heart failure team, Dr. Gala Romney regarding VAD placement.  He shares that he is  very much in agreement with this modality of care.  Kenneth [name] does have what he identifies as excellent support through his wife and children.  He shares he also has a best friend of 30 years who he anticipates could have some involvement if additional support is required.  We reviewed one of the great obstacles to bad placement inclusive of the fact that Kenneth [name] presently does not have insurance.  He notes that he had insurance up  until Obama care came in to affect and when that occurred his insurance premiums went through the roof inhibiting him from continuing to have insurance.  He would like to do anything possible to preserve life and improve the quality of life he is living.  He shares knowledge that VAT placement is a very serious procedure as well as the recovery process.  He would like to proceed with this if he is deemed an appropriate candidate.  Discussed the importance of continued conversation with family and their  medical providers regarding overall plan of care and treatment options, ensuring decisions are within the context of the patients values and GOCs.  Decision Maker: Kenneth [name] (Spouse): [phone] (Home Phone)   SUMMARY OF RECOMMENDATIONS   Full Code / Full Scope of Care  Have provided AD's for review and completion  Patient notes having strong social support through his wife and children  Patient is interested in VAD if deemed a candidate  Kenneth [name] shares he no longer continued to be ensured when Obama Care went into effect due to his high premiums  Ongoing support  Code Status/Advance Care Planning: FULL CODE   Palliative Prophylaxis:  Aspiration, Bowel Regimen, Delirium Protocol, Frequent Pain Assessment, Oral Care, Palliative Wound Care, and Turn Reposition  Additional Recommendations (Limitations, Scope, Preferences): Continue present care  Psycho-social/Spiritual:  Desire for further Chaplaincy support: Yes Additional Recommendations: Discussion related to advanced heart failure and VAD placement   Prognosis: Will depend upon interventions pursued.  Discharge Planning: To be determined.  Vitals:   04/13/23 2323 04/14/23 0252  BP: 108/77 116/80  Pulse: 90 84  Resp:  18  Temp: 97.8 F (36.6 C) 98.5 F (36.9 C)  SpO2: 96% 97%    Intake/Output Summary (Last 24 hours) at 04/14/2023 0656 Last data filed at 04/13/2023 2324 Gross per 24 hour  Intake 483 ml  Output 1025  ml  Net -542 ml   Last Weight  Most recent update: 04/14/2023  4:22 AM    Weight  67.6 kg (149 lb 0.5 oz)            Gen: Middle aged Caucasian M in NAD HEENT: moist mucous membranes CV: Regular rate and rhythm  PULM:  On RA, breathing is even and nonlabored ABD: soft/nontender  EXT: No edema  Neuro: Alert and oriented x3   PPS: 60%   This conversation/these recommendations were discussed with patient primary care team, Dr. Gala Romney  Billing based on MDM: High ______________________________________________________ [name] [place] Palliative Medicine Team Team Cell Phone: [phone] Please utilize secure chat with additional questions, if there is no response within 30 minutes please call the above phone number  Palliative Medicine Team providers are available by phone from 7am to 7pm daily and can be reached through the team cell phone.  Should this patient require assistance outside of these hours, please call the patient's attending physician.

## 2023-04-14 NOTE — Inpatient Diabetes Management (Signed)
 Inpatient Diabetes Program Recommendations  AACE/ADA: New Consensus Statement on Inpatient Glycemic Control  Target Ranges:  Prepandial:   less than 140 mg/dL      Peak postprandial:   less than 180 mg/dL (1-2 hours)      Critically ill patients:  140 - 180 mg/dL    Latest Reference Range & Units 04/13/23 06:46 04/13/23 11:11 04/13/23 16:37 04/13/23 20:59 04/14/23 06:14  Glucose-Capillary 70 - 99 mg/dL 829 (H) 562 (H) 130 (H) 136 (H) 372 (H)   Review of Glycemic Control  Diabetes history: DM2 Outpatient Diabetes medications: Metformin 1000 mg BID (recently resumed taking 1-2 weeks ago), Amaryl 4 mg daily (not taking) Current orders for Inpatient glycemic control: Lantus 10 units daily, Novolog 0-9 units TID with meals, Novolog 0-5 units at bedtime, Novolog 5 units TID with meals   Inpatient Diabetes Program Recommendations:     Insulin: Please consider increasing Lantus to 13 units daily, increasing meal coverage to Novolog 7 units TID with melas, and changing frequency of CBGs and Novolog 0-9 units to Q4H (for closer glucose monitoring and correction).  Thanks, Orlando Penner, RN, MSN, CDCES Diabetes Coordinator Inpatient Diabetes Program [phone] (Team Pager from 8am to 5pm)'

## 2023-04-14 NOTE — Progress Notes (Signed)
 Advanced Heart Failure Rounding Note   Subjective:     Kenneth [name] is a 58 y.o. male with a PMH of CAD with PCI to the RCA and OM, chronic heart failure with reduced ejection fraction, diabetes, hyperlipidemia, hypertension, prior CVA, tobacco use who was admitted to [facility] w/ acute on chronic systolic heart failure w/ NYHA IV symptoms.  Has had progressive decline in functional status/exercise tolerance over the last year.     Echo LVEF 10-15%, severely dilated LV (6.7 LVIDD) w/ large LV thrombus. RV mildly reduced.    LHC 8/24:  LM ok LAD minor irregs LCx 100% large OM1 -> PCI  RCA CTO prox RHC 04/11/23 RA 8 PA 53/26 (36) PW 26 PVR 3.1 Fick 3.2/1.7 PAPi 2.1  On milrinone 0.25 and IV lasix Diuresis has slowed down .Co-ox 62% CVP 4-5   Objective:   Weight Range:  Vital Signs:   Temp:  [97.6 F (36.4 C)-98.5 F (36.9 C)] 98 F (36.7 C) (03/23 1203) Pulse Rate:  [84-98] 98 (03/23 1203) Resp:  [15-20] 16 (03/23 1203) BP: (90-116)/(70-82) 90/70 (03/23 1203) SpO2:  [96 %-97 %] 96 % (03/23 1203) Weight:  [67.6 kg] 67.6 kg (03/23 0422) Last BM Date : 04/13/23  Weight change: Filed Weights   04/12/23 1749 04/13/23 0524 04/14/23 0422  Weight: 72.9 kg 68.4 kg 67.6 kg    Intake/Output:   Intake/Output Summary (Last 24 hours) at 04/14/2023 1318 Last data filed at 04/14/2023 1222 Gross per 24 hour  Intake 670 ml  Output 1975 ml  Net -1305 ml     Physical Exam: General:  Well appearing. No resp difficulty HEENT: normal Neck: supple. no JVD. Carotids 2+ bilat; no bruits. No lymphadenopathy or thryomegaly appreciated. WUJ:WJXBJYN rate & rhythm. No rubs, gallops or murmurs. Lungs: clear Abdomen: soft, nontender, nondistended. No hepatosplenomegaly. No bruits or masses. Good bowel sounds. Extremities: no cyanosis, clubbing, rash, edema Neuro: alert & orientedx3, cranial nerves grossly intact. moves all 4 extremities w/o difficulty. Affect pleasant  Telemetry: NSR 90s 3-beat  run NSVT Personally reviewed   Labs: Basic Metabolic Panel: Recent Labs  Lab 04/10/23 0159 04/11/23 0510 04/11/23 0944 04/11/23 0958 04/12/23 0537 04/12/23 1702 04/13/23 0500 04/14/23 0450  NA 136 137 140 137 136  --  139 134*  K 3.8 3.6 3.4* 3.5 4.2  --  3.9 4.0  CL 102 101  --   --  104  --  102 97*  CO2 26 27  --   --  21*  --  26 27  GLUCOSE 114* 299*  --   --  336*  --  109* 323*  BUN 25* 22*  --   --  21*  --  20 25*  CREATININE 1.26* 1.07  --   --  0.94  --  1.25* 1.27*  CALCIUM 8.7* 8.4*  --   --  8.5*  --  8.9 9.1  MG  --   --   --   --   --  1.9 1.8 2.0    Liver Function Tests: Recent Labs  Lab 04/09/23 0121  AST 29  ALT 40  ALKPHOS 90  BILITOT 1.9*  PROT 6.0*  ALBUMIN 3.0*   No results for input(s): "LIPASE", "AMYLASE" in the last 168 hours. No results for input(s): "AMMONIA" in the last 168 hours.  CBC: Recent Labs  Lab 04/09/23 0121 04/10/23 8295 04/11/23 0510 04/11/23 0944 04/11/23 0958 04/12/23 0537 04/13/23 0500 04/14/23 0450  WBC 9.7  11.4* 8.8  --   --  7.9 9.1 8.8  NEUTROABS 7.3  --   --   --   --   --   --   --   HGB 15.4 15.0 14.5 15.0 15.0 14.6 14.7 16.4  HCT 46.5 43.1 42.4 44.0 44.0 43.8 44.5 49.8  MCV 88.2 85.3 84.6  --   --  87.4 88.1 88.5  PLT 215 212 215  --   --  217 265 304    Cardiac Enzymes: No results for input(s): "CKTOTAL", "CKMB", "CKMBINDEX", "TROPONINI" in the last 168 hours.  BNP: BNP (last 3 results) Recent Labs    02/18/23 1348 04/09/23 0121  BNP 697.3* 1,185.6*    ProBNP (last 3 results) No results for input(s): "PROBNP" in the last 8760 hours.    Other results:  Imaging: VAS US DOPPLER PRE VAD Result Date: 04/13/2023 PERIOPERATIVE VASCULAR EVALUATION Patient Name:  Kenneth [name]  Date of Exam:   04/13/2023 Medical Rec #: [id number]     Accession #:    [id number] Date of Birth: [birth year]     Patient Gender: M Patient Age:   28 years Exam Location:  [facility] Procedure:      VAS US DOPPLER  PRE VAD Referring Phys: Reuel Boom Rashena Dowling --------------------------------------------------------------------------------  Indications:      Pre VAD workup. HFrEF 10-15% with large LV thrombus Risk Factors:     Hypertension, hyperlipidemia, Diabetes, past history of                   smoking, prior MI, coronary artery disease, prior CVA. 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 Limitations:      arrythmia Comparison Study: Prior normal carotid duplex done at [facility] 12/29/2015 Performing Technologist: Sherren Kerns RVS  Examination Guidelines: A complete evaluation includes B-mode imaging, spectral Doppler, color Doppler, and power Doppler as needed of all accessible portions of each vessel. Bilateral testing is considered an integral part of a complete examination. Limited examinations for reoccurring indications may be performed as noted.  Right Carotid Findings: +----------+--------+--------+--------+------------+------------------+           PSV cm/sEDV cm/sStenosisDescribe    Comments           +----------+--------+--------+--------+------------+------------------+ CCA Prox  87      18                          intimal thickening +----------+--------+--------+--------+------------+------------------+ CCA Mid   68      24              homogeneous                    +----------+--------+--------+--------+------------+------------------+ CCA Distal80      16                          intimal thickening +----------+--------+--------+--------+------------+------------------+ ICA Prox  49      18              heterogenous                   +----------+--------+--------+--------+------------+------------------+ ICA Mid   76      25                                             +----------+--------+--------+--------+------------+------------------+  ICA Distal90      29                                              +----------+--------+--------+--------+------------+------------------+ ECA       62      18                                             +----------+--------+--------+--------+------------+------------------+ +----------+--------+-------+--------+------------+           PSV cm/sEDV cmsDescribeArm Pressure +----------+--------+-------+--------+------------+ Subclavian51                                  +----------+--------+-------+--------+------------+ +---------+-------+--------+---------------------------------------------------+ VertebralPSV    EDV cm/sappears occluded, history of right vertebral                 cm/s           dissection                                          +---------+-------+--------+---------------------------------------------------+ Left Carotid Findings: +----------+--------+--------+--------+------------+------------------+           PSV cm/sEDV cm/sStenosisDescribe    Comments           +----------+--------+--------+--------+------------+------------------+ CCA Prox  77      13                          intimal thickening +----------+--------+--------+--------+------------+------------------+ CCA Distal71      16                          intimal thickening +----------+--------+--------+--------+------------+------------------+ ICA Prox  84      31              heterogenous                   +----------+--------+--------+--------+------------+------------------+ ICA Mid   91      36                                             +----------+--------+--------+--------+------------+------------------+ ICA Distal85      33                                             +----------+--------+--------+--------+------------+------------------+ ECA       109     11                                             +----------+--------+--------+--------+------------+------------------+  +----------+--------+--------+--------+------------+ SubclavianPSV cm/sEDV cm/sDescribeArm Pressure +----------+--------+--------+--------+------------+           87  109          +----------+--------+--------+--------+------------+ +---------+--------+--+--------+--+---------+ VertebralPSV cm/s50EDV cm/s17Antegrade +---------+--------+--+--------+--+---------+  ABI Findings: +---------+------------------+-----+-----------+--------------------+ Right    Rt Pressure (mmHg)IndexWaveform   Comment              +---------+------------------+-----+-----------+--------------------+ Brachial                                   Restricted-PICC line +---------+------------------+-----+-----------+--------------------+ PTA      119               1.09 multiphasic                     +---------+------------------+-----+-----------+--------------------+ DP       124               1.14 multiphasic                     +---------+------------------+-----+-----------+--------------------+ Randie Heinz Toe117                    Normal                          +---------+------------------+-----+-----------+--------------------+ +---------+------------------+-----+-----------+-------+ Left     Lt Pressure (mmHg)IndexWaveform   Comment +---------+------------------+-----+-----------+-------+ Brachial 109                    multiphasic        +---------+------------------+-----+-----------+-------+ PTA      116               1.06 multiphasic        +---------+------------------+-----+-----------+-------+ DP       107               0.98 multiphasic        +---------+------------------+-----+-----------+-------+ Andreas Ohm                     Normal             +---------+------------------+-----+-----------+-------+ +-------+---------------+----------------+ ABI/TBIToday's ABI/TBIPrevious ABI/TBI +-------+---------------+----------------+  Right  1.14/1.07                       +-------+---------------+----------------+ Left   1.06/0.79                       +-------+---------------+----------------+  Summary: Right Carotid: The extracranial vessels were near-normal with only minimal wall                thickening or plaque. Left Carotid: The extracranial vessels were near-normal with only minimal wall               thickening or plaque. Vertebrals:  Left vertebral artery demonstrates antegrade flow. Right vertebral              artery demonstrates an occlusion. Subclavians: Normal flow hemodynamics were seen in bilateral subclavian              arteries.  *See table(s) above for measurements and observations. Right ABI: Resting right ankle-brachial index is within normal range. The right toe-brachial index is normal. Left ABI: Resting left ankle-brachial index is within normal range. The left toe-brachial index is normal.     Preliminary    VAS [place] LOWER EXTREMITY VENOUS (DVT) Result Date: 04/13/2023  Lower Venous DVT Study Patient Name:  Kenneth [name] Dohmen  Date of  Exam:   04/13/2023 Medical Rec #: [id number]     Accession #:    [id number] Date of Birth: 01-Nov-1965     Patient Gender: M Patient Age:   67 years Exam Location:  [facility] Procedure:      VAS [place] LOWER EXTREMITY VENOUS (DVT) Referring Phys: Reuel Boom Markon Jares --------------------------------------------------------------------------------  Indications: Pre-op VAD workup.  Comparison Study: No prior study on file Performing Technologist: Sherren Kerns RVS  Examination Guidelines: A complete evaluation includes B-mode imaging, spectral Doppler, color Doppler, and power Doppler as needed of all accessible portions of each vessel. Bilateral testing is considered an integral part of a complete examination. Limited examinations for reoccurring indications may be performed as noted. The reflux portion of the exam is performed with the patient in reverse Trendelenburg.   +---------+---------------+---------+-----------+---------------+-------------+ RIGHT    CompressibilityPhasicitySpontaneityProperties     Thrombus                                                                 Aging         +---------+---------------+---------+-----------+---------------+-------------+ CFV      Full           Yes      No         pulsatile                                                                waveform                     +---------+---------------+---------+-----------+---------------+-------------+ SFJ      Full                                                            +---------+---------------+---------+-----------+---------------+-------------+ FV Prox  Full                                                            +---------+---------------+---------+-----------+---------------+-------------+ FV Mid   Full                                                            +---------+---------------+---------+-----------+---------------+-------------+ FV DistalFull           Yes      Yes        pulsatile  waveform                     +---------+---------------+---------+-----------+---------------+-------------+ PFV      Full                                                            +---------+---------------+---------+-----------+---------------+-------------+ POP      Full           Yes      No         pulsatile                                                                waveform                     +---------+---------------+---------+-----------+---------------+-------------+ PTV      Full                                                            +---------+---------------+---------+-----------+---------------+-------------+ PERO     Full                                                             +---------+---------------+---------+-----------+---------------+-------------+ Gastroc  Full                                                            +---------+---------------+---------+-----------+---------------+-------------+   +---------+---------------+---------+-----------+---------------+-------------+ LEFT     CompressibilityPhasicitySpontaneityProperties     Thrombus                                                                 Aging         +---------+---------------+---------+-----------+---------------+-------------+ CFV      Full           Yes      No         pulsatile                                                                waveform                     +---------+---------------+---------+-----------+---------------+-------------+  SFJ      Full                                                            +---------+---------------+---------+-----------+---------------+-------------+ FV Prox  Full                                                            +---------+---------------+---------+-----------+---------------+-------------+ FV Mid   Full                                                            +---------+---------------+---------+-----------+---------------+-------------+ FV DistalFull                                                            +---------+---------------+---------+-----------+---------------+-------------+ PFV      Full                                                            +---------+---------------+---------+-----------+---------------+-------------+ POP      Full           Yes      No         pulsatile                                                                waveform                     +---------+---------------+---------+-----------+---------------+-------------+ PTV      Full                                                             +---------+---------------+---------+-----------+---------------+-------------+ PERO     Full                                                            +---------+---------------+---------+-----------+---------------+-------------+ Gastroc  Full                                                            +---------+---------------+---------+-----------+---------------+-------------+  Summary: RIGHT: - There is no evidence of deep vein thrombosis in the lower extremity.  - No cystic structure found in the popliteal fossa. Pulsatile waveforms  LEFT: - There is no evidence of deep vein thrombosis in the lower extremity.  - No cystic structure found in the popliteal fossa. Pulsatile waveforms.  *See table(s) above for measurements and observations.    Preliminary    CT ABDOMEN PELVIS W CONTRAST Result Date: 04/12/2023 CLINICAL DATA:  Preop for potential LVAD. EXAM: CT ABDOMEN AND PELVIS WITH CONTRAST TECHNIQUE: Multidetector CT imaging of the abdomen and pelvis was performed using the standard protocol following bolus administration of intravenous contrast. RADIATION DOSE REDUCTION: This exam was performed according to the departmental dose-optimization program which includes automated exposure control, adjustment of the mA and/or kV according to patient size and/or use of iterative reconstruction technique. CONTRAST:  60mL OMNIPAQUE IOHEXOL 350 MG/ML SOLN COMPARISON:  None Available. FINDINGS: Lower chest: The heart is enlarged. Mild septal thickening at the lung bases may represent edema. Trace right pleural effusion. Hepatobiliary: No focal liver abnormality. Liver is normal in size. The gallbladder is nondistended. No calcified gallstone. No biliary dilatation. Pancreas: No ductal dilatation or inflammation. Spleen: Normal in size without focal abnormality. Adrenals/Urinary Tract: Normal adrenal glands. No hydronephrosis or renal calculi. No suspicious renal lesion. Partially distended urinary  bladder, mildly thick walled. Stomach/Bowel: Mild wall thickening at the gastroesophageal junction. The stomach is physiologically distended. There is a duodenal diverticulum. Fecalization of distal small bowel contents. The appendix is normal. Moderate colonic stool burden. Left colonic diverticulosis without diverticulitis. Vascular/Lymphatic: Moderate aortic and branch atherosclerosis. Circumferential noncalcified plaque throughout the abdominal aorta. 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 no enlarged lymph nodes in the abdomen or pelvis. Reproductive: Enlarged prostate gland. Other: Small volume abdominopelvic ascites. No free air. Mild generalized body wall edema. Musculoskeletal: There are no acute or suspicious osseous abnormalities. Lower lumbar facet hypertrophy. IMPRESSION: 1. Cardiomegaly with mild septal thickening at the lung bases, trace right pleural effusion, and small volume abdominopelvic ascites. Findings suggest CHF. 2. Colonic diverticulosis without diverticulitis. 3. Enlarged prostate gland. Mild wall thickening of the urinary bladder, may be due to chronic outlet obstruction. Electronically Signed   By: Narda Rutherford M.D.   On: 04/12/2023 22:13   DG Orthopantogram Result Date: 04/12/2023 CLINICAL DATA:  [id number] Preoperative evaluation to rule out surgical contraindication [id number] EXAM: ORTHOPANTOGRAM/PANORAMIC COMPARISON:  None Available. FINDINGS: No fracture significant osseous abnormality. No retained radiopaque foreign body. Missing left mandibular molar. Well-aerated partially visualized maxillary sinuses. IMPRESSION: Negative. Electronically Signed   By: Tish Frederickson M.D.   On: 04/12/2023 21:27   [place] EKG SITE RITE Result Date: 04/12/2023 If Site Rite image not attached, placement could not be confirmed due to current cardiac rhythm.    Medications:     Scheduled Medications:  amiodarone  200 mg Oral BID    aspirin  81 mg Oral Daily   atorvastatin  80 mg Oral Daily   busPIRone  5 mg Oral BID   Chlorhexidine Gluconate Cloth  6 each Topical Daily   digoxin  0.125 mg Oral Daily   insulin aspart  0-5 Units Subcutaneous QHS   insulin aspart  0-9 Units Subcutaneous Q4H   insulin aspart  7 Units Subcutaneous TID WC   insulin glargine  13 Units Subcutaneous Daily   losartan  12.5 mg Oral Daily   melatonin  2.5 mg Oral QHS   pantoprazole  40 mg Oral Daily   sodium chloride flush  10-40 mL Intracatheter Q12H   sodium chloride flush  3 mL Intravenous Q12H   spironolactone  12.5 mg Oral Daily    Infusions:  heparin 1,600 Units/hr (04/13/23 2209)   milrinone 0.25 mcg/kg/min (04/14/23 0006)    PRN Medications: sodium chloride flush, sodium chloride flush   Assessment/Plan:   1. Acute on chronic systolic CHF: ?Primarily ischemic cardiomyopathy.  Echo in 8/24 at time of last NSTEMI with EF 25-30%.  Echo this admission with EF 10-15%, severe LV dilation with LV thrombus, mild RV dysfunction.  His cardiomyopathy does seem out of proportion to his coronary disease.  He does not drink ETOH or use drugs.  No family history of dilated cardiomyopathy (FH of CAD).  He does have uncontrolled DM as well as a chronic wide LBBB (176 msec) which could contribute. Also noted to have frequent PVCs.  PCWP elevated on RHC yesterday, CI low at 1.67.   - Milrinone 0.25 mcg/kg/min Co-ox 62%  - Volume status much improved. Stop IV lasix - digoxin 0.125 daily.  - losartan 12.5 daily - spironolactone 12.5 daily.  - Hold Toprol XL for now with low output.  - BP too low to titrate GDMT - We have started discussion of LVAD with Mr Rosenow.  He has low output HF with quite low EF.  Not a transplant candidate at this time with active smoking prior to admission.  Barriers for LVAD will be poor DM control (hgbA1c 12) and lack of insurance.   As above, his cardiomyopathy appears out of proportion to CAD. Also has wide LBBB +  uncontrolled DM + PVCs may all play a role. Pending cardiac MRI.   - If he does not have a large amount of scar in his LV myocardium can consider CRT but suspect degree of HF may be too advanced to benefit - If he has significant LV myocardial scarring VAD would be best option 2. LV thrombus: continue heparin 3. PVCs/NSVT: Patient noted to have frequent PVCs on telemetry.   - With addition of milrinone, now on amiodarone 200 mg po bid.  - NSVT stable 4. CAD: Remote PCI to RCA, cath in 8/24 showed chronically occluded proximal RCA.  NSTEMI in 8/24 with occluded OM1 treated with DES.  LAD with minimal disease.  No chest pain currently.  HS-TnI not significantly elevated, no ACS this admission.  - He is now off Plavix (> 6 months post PCI).  - Continue ASA 81.  - Continue statin.  5. DM2: Poor control, hgbA1c 12.   - Diabetes coordinator consult. - On standing insulin now + SSI.  6. Smoking: He has quit 7. Will need social worker to help [place] with getting insurance set up.    Length of Stay: 2   Arvilla Meres MD 04/14/2023, 1:18 PM  Advanced Heart Failure Team Pager [phone] (M-F; 7a - 4p)  Please contact [facility] for night-coverage after hours (4p -7a ) and weekends on [email or web address]

## 2023-04-15 ENCOUNTER — Inpatient Hospital Stay (HOSPITAL_COMMUNITY): Payer: Self-pay

## 2023-04-15 ENCOUNTER — Inpatient Hospital Stay (HOSPITAL_COMMUNITY)
Admit: 2023-04-15 | Discharge: 2023-04-15 | Disposition: A | Discharge status: Home / Self Care | Payer: Self-pay | Attending: Internal Medicine | Admitting: Internal Medicine

## 2023-04-15 DIAGNOSIS — I5021 Acute systolic (congestive) heart failure: Secondary | ICD-10-CM

## 2023-04-15 LAB — GLUCOSE, CAPILLARY
Glucose-Capillary: 375 mg/dL — ABNORMAL HIGH (ref 70–99)
Glucose-Capillary: 234 mg/dL — ABNORMAL HIGH (ref 70–99)
Glucose-Capillary: 253 mg/dL — ABNORMAL HIGH (ref 70–99)
Glucose-Capillary: 104 mg/dL — ABNORMAL HIGH (ref 70–99)
Glucose-Capillary: 217 mg/dL — ABNORMAL HIGH (ref 70–99)

## 2023-04-15 LAB — BASIC METABOLIC PANEL
Anion gap: 11 (ref 5–15)
Anion gap: 11 (ref 5–15)
BUN: 27 mg/dL — ABNORMAL HIGH (ref 6–20)
BUN: 22 mg/dL — ABNORMAL HIGH (ref 6–20)
CO2: 27 mmol/L (ref 22–32)
CO2: 26 mmol/L (ref 22–32)
Calcium: 8.9 mg/dL (ref 8.9–10.3)
Calcium: 9.3 mg/dL (ref 8.9–10.3)
Chloride: 97 mmol/L — ABNORMAL LOW (ref 98–111)
Chloride: 96 mmol/L — ABNORMAL LOW (ref 98–111)
Creatinine, Ser: 1.33 mg/dL — ABNORMAL HIGH (ref 0.61–1.24)
Creatinine, Ser: 1.08 mg/dL (ref 0.61–1.24)
GFR, Estimated: >60 mL/min (ref >60)
GFR, Estimated: >60 mL/min (ref >60)
Glucose, Bld: 394 mg/dL — ABNORMAL HIGH (ref 70–99)
Glucose, Bld: 138 mg/dL — ABNORMAL HIGH (ref 70–99)
Potassium: 4.8 mmol/L (ref 3.5–5.1)
Potassium: 4.1 mmol/L (ref 3.5–5.1)
Sodium: 135 mmol/L (ref 135–145)
Sodium: 133 mmol/L — ABNORMAL LOW (ref 135–145)

## 2023-04-15 LAB — COOXEMETRY PANEL
Carboxyhemoglobin: 1 % (ref 0.5–1.5)
Carboxyhemoglobin: 1.3 % (ref 0.5–1.5)
Methemoglobin: <0.7 % (ref 0.0–1.5)
Methemoglobin: 0.9 % (ref 0.0–1.5)
O2 Saturation: 58.1 %
O2 Saturation: 66.8 %
Total hemoglobin: 15.9 g/dL (ref 12.0–16.0)
Total hemoglobin: 16 g/dL (ref 12.0–16.0)

## 2023-04-15 LAB — CBC
HCT: 46.6 % (ref 39.0–52.0)
Hemoglobin: 15.4 g/dL (ref 13.0–17.0)
MCH: 29.3 pg (ref 26.0–34.0)
MCHC: 33 g/dL (ref 30.0–36.0)
MCV: 88.6 fL (ref 80.0–100.0)
Platelets: 306 10*3/uL (ref 150–400)
RBC: 5.26 MIL/uL (ref 4.22–5.81)
RDW: 14.9 % (ref 11.5–15.5)
WBC: 8.3 10*3/uL (ref 4.0–10.5)
nRBC: 0 % (ref 0.0–0.2)

## 2023-04-15 LAB — MAGNESIUM
Magnesium: 1.8 mg/dL (ref 1.7–2.4)
Magnesium: 2.2 mg/dL (ref 1.7–2.4)

## 2023-04-15 LAB — HEPARIN LEVEL (UNFRACTIONATED): Heparin Unfractionated: 0.65 [IU]/mL (ref 0.30–0.70)

## 2023-04-15 LAB — LUPUS ANTICOAGULANT PANEL
DRVVT: 37.6 s (ref 0.0–47.0)
PTT Lupus Anticoagulant: 38.9 s (ref 0.0–43.5)

## 2023-04-15 MED ORDER — MAGNESIUM SULFATE 2 GM/50ML IV SOLN
2.0000 g | Freq: Once | INTRAVENOUS | Status: AC
  Administered 2023-04-15: 2 g via INTRAVENOUS
  Filled 2023-04-15: qty 50

## 2023-04-15 MED ORDER — GADOBUTROL 1 MMOL/ML IV SOLN
10.0000 mL | Freq: Once | INTRAVENOUS | Status: AC | PRN
  Administered 2023-04-15: 10 mL via INTRAVENOUS

## 2023-04-15 MED ORDER — INSULIN GLARGINE-YFGN 100 UNIT/ML ~~LOC~~ SOLN
16.0000 [IU] | Freq: Every day | SUBCUTANEOUS | Status: DC
Start: 2023-04-16 — End: 2023-04-16
  Filled 2023-04-15: qty 0.16

## 2023-04-15 MED ORDER — ASPIRIN 81 MG PO TBEC
81.0000 mg | DELAYED_RELEASE_TABLET | Freq: Every day | ORAL | Status: DC
  Administered 2023-04-16 – 2023-04-25 (×10): 81 mg via ORAL
  Filled 2023-04-15 (×10): qty 1

## 2023-04-15 MED ORDER — INSULIN GLARGINE-YFGN 100 UNIT/ML ~~LOC~~ SOLN
4.0000 [IU] | Freq: Once | SUBCUTANEOUS | Status: AC
  Administered 2023-04-15: 4 [IU] via SUBCUTANEOUS
  Filled 2023-04-15: qty 0.04

## 2023-04-15 MED ORDER — CHLORHEXIDINE GLUCONATE CLOTH 2 % EX PADS
6.0000 | MEDICATED_PAD | Freq: Every day | CUTANEOUS | Status: AC
  Administered 2023-04-15 – 2023-04-19 (×5): 6 via TOPICAL

## 2023-04-15 MED ORDER — TORSEMIDE 20 MG PO TABS
20.0000 mg | ORAL_TABLET | Freq: Every day | ORAL | Status: DC
  Administered 2023-04-15 – 2023-04-17 (×3): 20 mg via ORAL
  Filled 2023-04-15 (×3): qty 1

## 2023-04-15 MED ORDER — MUPIROCIN 2 % EX OINT
1.0000 | TOPICAL_OINTMENT | Freq: Two times a day (BID) | CUTANEOUS | Status: AC
  Administered 2023-04-15 – 2023-04-19 (×7): 1 via NASAL
  Filled 2023-04-15: qty 22

## 2023-04-15 MED ORDER — INSULIN GLARGINE 100 UNIT/ML ~~LOC~~ SOLN: 16.0000 [IU] | Freq: Every day | SUBCUTANEOUS | Status: DC

## 2023-04-15 NOTE — Progress Notes (Signed)
 This chaplain responded to PMT NP-Michelle consult for creating/updating the Pt. Advance Directive. The Pt. wife-Pam is at the bedside. The chaplain understands AD education was completed prior to the chaplain's visit. The Pt. answered the chaplain's clarifying questions on the YQ:MVHQI and Living Will before calling the notary and witnesses.  The chaplain is present with the Pt., Pam, notary and witnesses for the notarizing of the Pt. AD, both HCPOA and Living Will.  The Pt. named Idelle Leech as his healthcare agent. If this person is unable or unwilling to serve in this role the Pt. next choice is [facility].  The chaplain gave the Pt. the original AD along with two copies. The chaplain scanned the Pt. AD into the Pt. EMR.  This chaplain is available for F/U spiritual care as needed.  Chaplain Stephanie Acre [phone]

## 2023-04-15 NOTE — Plan of Care (Signed)
  Problem: Education: Goal: Knowledge of General Education information will improve Description: Including pain rating scale, medication(s)/side effects and non-pharmacologic comfort measures Outcome: Progressing   Problem: Clinical Measurements: Goal: Ability to maintain clinical measurements within normal limits will improve Outcome: Progressing Goal: Will remain free from infection Outcome: Progressing   Problem: Nutrition: Goal: Adequate nutrition will be maintained Outcome: Progressing   Problem: Elimination: Goal: Will not experience complications related to bowel motility Outcome: Progressing Goal: Will not experience complications related to urinary retention Outcome: Progressing   Problem: Pain Managment: Goal: General experience of comfort will improve and/or be controlled Outcome: Progressing   Problem: Safety: Goal: Ability to remain free from injury will improve Outcome: Progressing   Problem: Fluid Volume: Goal: Ability to maintain a balanced intake and output will improve Outcome: Progressing   Problem: Nutritional: Goal: Maintenance of adequate nutrition will improve Outcome: Progressing Goal: Progress toward achieving an optimal weight will improve Outcome: Progressing

## 2023-04-15 NOTE — TOC Initial Note (Signed)
 Transition of Care [facility]) - Initial/Assessment Note    Patient Details  Name: Kenneth [name] MRN: [id number] Date of Birth: 01/04/1966  Transition of Care [facility] CM/SW Contact:    Elliot Cousin, RN Phone Number: [phone] 04/15/2023, 3:59 PM  Clinical Narrative:                  TOC CM spoke to pt at bedside. States he lives at home with wife. Gave permission to speak to wife. Referral sent to Financial Counselor for Medicaid screening.   Will continue to follow for dc needs.    Expected Discharge Plan: IP Rehab Facility Barriers to Discharge: Continued Medical Work up   Patient Goals and CMS Choice Patient states their goals for this hospitalization and ongoing recovery are:: wants to get better          Expected Discharge Plan and Services   Discharge Planning Services: CM Consult   Living arrangements for the past 2 months: Single Family Home                                      Prior Living Arrangements/Services Living arrangements for the past 2 months: Single Family Home Lives with:: Spouse Patient language and need for interpreter reviewed:: Yes Do you feel safe going back to the place where you live?: Yes      Need for Family Participation in Patient Care: No (Comment) Care giver support system in place?: No (comment)   Criminal Activity/Legal Involvement Pertinent to Current Situation/Hospitalization: No - Comment as needed  Activities of Daily Living   ADL Screening (condition at time of admission) Independently performs ADLs?: Yes (appropriate for developmental age) Is the patient deaf or have difficulty hearing?: No Does the patient have difficulty seeing, even when wearing glasses/contacts?: No Does the patient have difficulty concentrating, remembering, or making decisions?: No  Permission Sought/Granted Permission sought to share information with : Case Manager, Family Supports, PCP Permission granted to share information with :  Yes, Verbal Permission Granted  Share Information with NAME: Alexa Golebiewski     Permission granted to share info w Relationship: wife  Permission granted to share info w Contact Information: [phone]  Emotional Assessment Appearance:: Appears stated age Attitude/Demeanor/Rapport: Engaged Affect (typically observed): Accepting Orientation: : Oriented to Self, Oriented to Place, Oriented to  Time, Oriented to Situation   Psych Involvement: No (comment)  Admission diagnosis:  Acute systolic CHF (congestive heart failure) (HCC) [I50.21] Patient Active Problem List   Diagnosis Date Noted   Acute systolic CHF (congestive heart failure) (HCC) 04/12/2023   CHF (congestive heart failure) (HCC) 04/10/2023   Precordial chest pain 04/09/2023   NSTEMI (non-ST elevated myocardial infarction) (HCC) 09/03/2022   Tobacco abuse counseling 09/03/2022   History of CVA (cerebrovascular accident) 09/03/2022   Numbness and tingling in left arm 05/23/2017   Neck pain 05/23/2017   Acute pain of both shoulders 03/11/2017   Encounter for central line placement    Obstructive hydrocephalus (HCC)    Vertebral artery dissection (HCC)    Ischemic stroke (HCC) 02/20/2017   Type 2 diabetes mellitus with hyperlipidemia (HCC)    Uncontrolled type 2 diabetes mellitus with hyperglycemia (HCC)    Tobacco dependence    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), [place] - [address] [place]) [place] [address] Phone: [phone] Fax: [phone]  [facility] - [facility] [address] [place] [address] Phone: [phone] Fax: [phone]  [facility] [address] [place] [address] Phone: [phone] Fax: [phone]     Social Drivers of Health (SDOH) Social History: SDOH Screenings   Food Insecurity: No Food Insecurity (04/12/2023)  Housing: Low Risk  (04/12/2023)  Transportation Needs: No Transportation Needs (04/12/2023)  Utilities: Not At Risk (04/12/2023)  Financial Resource Strain: Low Risk  (02/18/2023)   Received from [facility]  Social Connections: Unknown (04/09/2023)  Tobacco Use: High Risk (04/14/2023)   SDOH Interventions:     Readmission Risk Interventions     No data to display

## 2023-04-15 NOTE — Progress Notes (Signed)
   Palliative Medicine Inpatient Follow Up Note HPI: Kenneth [name] is a 58 y.o. male with a PMH of CAD with PCI to the RCA and OM, chronic heart failure with reduced ejection fraction, diabetes, hyperlipidemia, hypertension, prior CVA, tobacco use who was admitted to [facility] w/ acute on chronic systolic heart failure w/ NYHA IV symptoms. Has had progressive decline in functional status/exercise tolerance over the last year. The PMT has been asked to support LVAD evaluation.   Today's Discussion 04/15/2023  *Please note that this is a verbal dictation therefore any spelling or grammatical errors are due to the "Dragon Medical One" system interpretation.  Chart reviewed inclusive of vital signs, progress notes, laboratory results, and diagnostic images.   I met with Kenneth [name] at bedside this morning. He was sitting in the recliner with his spouse, Kenneth [name] present.   We reviewed the importance of Kenneth [name] maintaining his work even while in the hospital. This gives him quality of life and as he shares provides him with "purpose".   Reviewed that Kenneth [name] is ware insurance would be needed to get the VAD. He shares he is aware and has spoken to social work.  Discussed the importance of advanced directives. I was able to review them with Kenneth [name] and his wife.  We discussed the plan to have them formalized with our Chaplain team.   Patients goals are for VAD procedure.   Questions and concerns addressed/Palliative Support Provided.   Objective Assessment: Vital Signs Vitals:   04/15/23 0738 04/15/23 0940  BP: 114/79   Pulse:  85  Resp: 19   Temp: 98.6 F (37 C)   SpO2: 97%     Intake/Output Summary (Last 24 hours) at 04/15/2023 1138 Last data filed at 04/15/2023 0944 Gross per 24 hour  Intake 440 ml  Output 1050 ml  Net -610 ml   Last Weight  Most recent update: 04/15/2023  5:48 AM    Weight  68.9 kg (151 lb 14.4 oz)            Gen: Middle aged Caucasian M in NAD HEENT: moist mucous  membranes CV: Regular rate and rhythm  PULM:  On RA, breathing is even and nonlabored ABD: soft/nontender  EXT: No edema  Neuro: Alert and oriented x3    SUMMARY OF RECOMMENDATIONS   Full Code / Full Scope of Care   Have reviewed AD's with patient and spouse. Plan for Kenneth [name] to help with completion   Patient is interested in VAD - he is willing to work with MSW for insurance   Ongoing support  Billing based on MDM: High - Advanced care planning, care escalation/de-escalation ______________________________________________________________________________________ Kenneth [name] [facility] Palliative Medicine Team Team Cell Phone: [phone] Please utilize secure chat with additional questions, if there is no response within 30 minutes please call the above phone number  Palliative Medicine Team providers are available by phone from 7am to 7pm daily and can be reached through the team cell phone.  Should this patient require assistance outside of these hours, please call the patient's attending physician.

## 2023-04-15 NOTE — Inpatient Diabetes Management (Signed)
 Inpatient Diabetes Program Recommendations  AACE/ADA: New Consensus Statement on Inpatient Glycemic Control (2015)  Target Ranges:  Prepandial:   less than 140 mg/dL      Peak postprandial:   less than 180 mg/dL (1-2 hours)      Critically ill patients:  140 - 180 mg/dL   Lab Results  Component Value Date   GLUCAP 253 (H) 04/15/2023   HGBA1C 12.0 (H) 04/09/2023    Review of Glycemic Control  Diabetes history: DM 2 Outpatient Diabetes medications: metformin  Current orders for Inpatient glycemic control:    Spoke with pt at bedside regarding A1c of 12% and glucose control surrounding his upcoming procedure and recovery process. Spoke with pt about the importance of glucose control. I pointed out how much insulin was required now to control his glucose trends and he is still not at goal. Discussed the need for insulin. Pt stated he didn't want insulin at first but sees the need for insulin for recovery. We discussed carbohydrates and lifestyle modifications as pt has many food sources at bedside. Discussed portion sizes at length and use of protein with meals and snacks. Pt needs a blue menu to see the carbohydrates in foods offered here. Secretary ordered one for pt from another floor.   Will continue to see pt to speak about carbs and food choices and glucose trends while here.  Thanks,  Christena Deem RN, MSN, BC-ADM Inpatient Diabetes Coordinator Team Pager [phone] (8a-5p)

## 2023-04-15 NOTE — Progress Notes (Signed)
 VAD coordinator met with pt and his wife Elita Quick this morning to go over equipment and answer any questions they had from our encounter on Friday.  Provided brief equipment overview and demonstration with HeartMate III training loop including discussion on the following:   a) mobile power unit b) system controller   c) universal Magazine features editor   d) battery clips   e) Batteries   f)  Perc lock   g) Percutaneous lead    Demonstrated and discussed:  a) changing power source on system controller from tethered (MPU) to untethered (battery) mode   b) changing power source on system controller from untethered (battery) to tethered (MPU) mode   c) how to monitor battery life both on the system controller and on each individual battery   d) changing batteries   All questions have been answered today. Pt is supposed to have a cardiac MRI today and PFTs at 2 pm. Social work team is working on Community education officer. Plan to present pt at [facility] today at 4pm.  Carlton Adam RN, BSN VAD Coordinator 24/7 Pager [phone]

## 2023-04-15 NOTE — Progress Notes (Signed)
 PHARMACY - ANTICOAGULATION CONSULT NOTE  Pharmacy Consult for heparin infusion Indication: Large LV thrombus   No Known Allergies  Patient Measurements: Height: 5\' 9"  (175.3 cm) Weight: 68.9 kg (151 lb 14.4 oz) IBW/kg (Calculated) : 70.7 Heparin Dosing Weight: 76.2 kg  Vital Signs: Temp: 98.4 F (36.9 C) (03/24 0546) Temp Source: Oral (03/24 0546) BP: 123/84 (03/24 0546) Pulse Rate: 92 (03/24 0546)  Labs: Recent Labs    04/12/23 1349 04/12/23 1702 04/13/23 0500 04/13/23 1700 04/14/23 0450 04/15/23 0550  HGB   < >  --  14.7  --  16.4 15.4  HCT  --   --  44.5  --  49.8 46.6  PLT  --   --  265  --  304 306  APTT  --  98*  --   --   --   --   LABPROT  --  14.4  --   --   --   --   INR  --  1.1  --   --   --   --   HEPARINUNFRC  --   --  <0.10* 0.49 0.47 0.65  CREATININE  --   --  1.25*  --  1.27* 1.33*   < > = values in this interval not displayed.   Estimated Creatinine Clearance: 59 mL/min (A) (by C-G formula based on SCr of 1.33 mg/dL (H)).  Medical History: Past Medical History:  Diagnosis Date   Coronary artery disease    Diabetes mellitus without complication (HCC)    GERD (gastroesophageal reflux disease)    Hypertension    Stroke (cerebrum) (HCC)     Medications:  Scheduled:   amiodarone  200 mg Oral BID   aspirin  81 mg Oral Daily   atorvastatin  80 mg Oral Daily   busPIRone  5 mg Oral BID   Chlorhexidine Gluconate Cloth  6 each Topical Daily   digoxin  0.125 mg Oral Daily   insulin aspart  0-5 Units Subcutaneous QHS   insulin aspart  0-9 Units Subcutaneous Q4H   insulin aspart  7 Units Subcutaneous TID WC   insulin glargine  13 Units Subcutaneous Daily   losartan  12.5 mg Oral Daily   melatonin  2.5 mg Oral QHS   multivitamin with minerals  1 tablet Oral Daily   pantoprazole  40 mg Oral Daily   sodium chloride flush  10-40 mL Intracatheter Q12H   sodium chloride flush  3 mL Intravenous Q12H   spironolactone  12.5 mg Oral Daily   Assessment:  58 y.o. male with CAD s/p PCI in 08/2022, hypertension, GERD, type 2 diabetes, hyperlipidemia, and tobacco use who presented with worsening shortness of breath over several months. Large LV thrombus noted on ECHO. No chronic anticoagulation noted based on dispense record. Pt does not have insurance.   Heparin level 0.65 this morning.  Drawn from PICC- confirmed with RN that heparin was paused x68min, line flushed and then drawn.  No overt bleeding or complications noted.  Plan:  Continue IV heparin 1600 units/hr. Daily heparin level and CBC.  Trixie Rude, PharmD Clinical Pharmacist 04/15/2023  6:49 AM

## 2023-04-15 NOTE — Progress Notes (Signed)
 Advanced Heart Failure Rounding Note   Subjective:     On milrinone 0.25. Diuretics stopped yesterday .Co-ox 58% CVP unhooked  Feels awesome this morning. Asking about walking outside. Denies CP/SOB.   Objective:   Echo LVEF 10-15%, severely dilated LV (6.7 LVIDD) w/ large LV thrombus. RV mildly reduced.    LHC 8/24:  LM ok LAD minor irregs LCx 100% large OM1 -> PCI  RCA CTO prox RHC 04/11/23 RA 8 PA 53/26 (36) PW 26 PVR 3.1 Fick 3.2/1.7 PAPi 2.1  Weight Range:  Vital Signs:   Temp:  [97.4 F (36.3 C)-99.5 F (37.5 C)] 98.6 F (37 C) (03/24 0738) Pulse Rate:  [77-98] 85 (03/24 0940) Resp:  [16-20] 19 (03/24 0738) BP: (90-123)/(70-84) 114/79 (03/24 0738) SpO2:  [94 %-98 %] 97 % (03/24 0738) Weight:  [68.9 kg] 68.9 kg (03/24 0546) Last BM Date : 04/13/23  Weight change: Filed Weights   04/13/23 0524 04/14/23 0422 04/15/23 0546  Weight: 68.4 kg 67.6 kg 68.9 kg    Intake/Output:   Intake/Output Summary (Last 24 hours) at 04/15/2023 1017 Last data filed at 04/15/2023 0548 Gross per 24 hour  Intake 390 ml  Output 1150 ml  Net -760 ml    CVP unhooked Physical Exam: General:  well appearing.  No respiratory difficulty Neck: supple. JVD flat.  Cor: PMI nondisplaced. Regular rate & rhythm. No rubs, gallops or murmurs. Lungs: clear Abdomen: soft, nontender, nondistended. Good bowel sounds. Extremities: no cyanosis, clubbing, rash, edema. RUE PICC Neuro: alert & oriented x 3. Moves all 4 extremities w/o difficulty. Affect pleasant.   Telemetry: NSR 90s with intt PVCs  (Personally reviewed)     Labs: Basic Metabolic Panel: Recent Labs  Lab 04/11/23 0510 04/11/23 0944 04/11/23 0958 04/12/23 0537 04/12/23 1702 04/13/23 0500 04/14/23 0450 04/15/23 0550  NA 137   < > 137 136  --  139 134* 135  K 3.6   < > 3.5 4.2  --  3.9 4.0 4.8  CL 101  --   --  104  --  102 97* 97*  CO2 27  --   --  21*  --  26 27 27   GLUCOSE 299*  --   --  336*  --  109* 323* 394*  BUN  22*  --   --  21*  --  20 25* 27*  CREATININE 1.07  --   --  0.94  --  1.25* 1.27* 1.33*  CALCIUM 8.4*  --   --  8.5*  --  8.9 9.1 8.9  MG  --   --   --   --  1.9 1.8 2.0 1.8   < > = values in this interval not displayed.    Liver Function Tests: Recent Labs  Lab 04/09/23 0121  AST 29  ALT 40  ALKPHOS 90  BILITOT 1.9*  PROT 6.0*  ALBUMIN 3.0*   No results for input(s): "LIPASE", "AMYLASE" in the last 168 hours. No results for input(s): "AMMONIA" in the last 168 hours.  CBC: Recent Labs  Lab 04/09/23 0121 04/10/23 0812 04/11/23 0510 04/11/23 0944 04/11/23 0958 04/12/23 0537 04/13/23 0500 04/14/23 0450 04/15/23 0550  WBC 9.7   < > 8.8  --   --  7.9 9.1 8.8 8.3  NEUTROABS 7.3  --   --   --   --   --   --   --   --   HGB 15.4   < > 14.5   < >  15.0 14.6 14.7 16.4 15.4  HCT 46.5   < > 42.4   < > 44.0 43.8 44.5 49.8 46.6  MCV 88.2   < > 84.6  --   --  87.4 88.1 88.5 88.6  PLT 215   < > 215  --   --  217 265 304 306   < > = values in this interval not displayed.    Cardiac Enzymes: No results for input(s): "CKTOTAL", "CKMB", "CKMBINDEX", "TROPONINI" in the last 168 hours.  BNP: BNP (last 3 results) Recent Labs    02/18/23 1348 04/09/23 0121  BNP 697.3* 1,185.6*   ProBNP (last 3 results) No results for input(s): "PROBNP" in the last 8760 hours.  Other results:  Imaging: VAS US DOPPLER PRE VAD Result Date: 04/14/2023 PERIOPERATIVE VASCULAR EVALUATION Patient Name:  Kenneth [name]  Date of Exam:   04/13/2023 Medical Rec #: [id number]     Accession #:    [id number] Date of Birth: [birth year]     Patient Gender: M Patient Age:   58 years Exam Location:  [facility] Procedure:      VAS US DOPPLER PRE VAD Referring Phys: Reuel Boom BENSIMHON --------------------------------------------------------------------------------  Indications:      Pre VAD workup. HFrEF 10-15% with large LV thrombus Risk Factors:     Hypertension, hyperlipidemia, Diabetes, past history of                    smoking, prior MI, coronary artery disease, prior CVA. 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 Limitations:      arrythmia Comparison Study: Prior normal carotid duplex done at [facility] 12/29/2015 Performing Technologist: Sherren Kerns RVS  Examination Guidelines: A complete evaluation includes B-mode imaging, spectral Doppler, color Doppler, and power Doppler as needed of all accessible portions of each vessel. Bilateral testing is considered an integral part of a complete examination. Limited examinations for reoccurring indications may be performed as noted.  Right Carotid Findings: +----------+--------+--------+--------+------------+------------------+           PSV cm/sEDV cm/sStenosisDescribe    Comments           +----------+--------+--------+--------+------------+------------------+ CCA Prox  87      18                          intimal thickening +----------+--------+--------+--------+------------+------------------+ CCA Mid   68      24              homogeneous                    +----------+--------+--------+--------+------------+------------------+ CCA Distal80      16                          intimal thickening +----------+--------+--------+--------+------------+------------------+ ICA Prox  49      18              heterogenous                   +----------+--------+--------+--------+------------+------------------+ ICA Mid   76      25                                             +----------+--------+--------+--------+------------+------------------+ ICA Distal90      29                                             +----------+--------+--------+--------+------------+------------------+  ECA       62      18                                             +----------+--------+--------+--------+------------+------------------+ +----------+--------+-------+--------+------------+           PSV cm/sEDV cmsDescribeArm  Pressure +----------+--------+-------+--------+------------+ Subclavian51                                  +----------+--------+-------+--------+------------+ +---------+-------+--------+---------------------------------------------------+ VertebralPSV    EDV cm/sappears occluded, history of right vertebral                 cm/s           dissection                                          +---------+-------+--------+---------------------------------------------------+ Left Carotid Findings: +----------+--------+--------+--------+------------+------------------+           PSV cm/sEDV cm/sStenosisDescribe    Comments           +----------+--------+--------+--------+------------+------------------+ CCA Prox  77      13                          intimal thickening +----------+--------+--------+--------+------------+------------------+ CCA Distal71      16                          intimal thickening +----------+--------+--------+--------+------------+------------------+ ICA Prox  84      31              heterogenous                   +----------+--------+--------+--------+------------+------------------+ ICA Mid   91      36                                             +----------+--------+--------+--------+------------+------------------+ ICA Distal85      33                                             +----------+--------+--------+--------+------------+------------------+ ECA       109     11                                             +----------+--------+--------+--------+------------+------------------+ +----------+--------+--------+--------+------------+ SubclavianPSV cm/sEDV cm/sDescribeArm Pressure +----------+--------+--------+--------+------------+           87                      109          +----------+--------+--------+--------+------------+ +---------+--------+--+--------+--+---------+ VertebralPSV cm/s50EDV cm/s17Antegrade  +---------+--------+--+--------+--+---------+  ABI Findings: +---------+------------------+-----+-----------+--------------------+ Right    Rt Pressure (mmHg)IndexWaveform   Comment              +---------+------------------+-----+-----------+--------------------+ Brachial  Restricted-PICC line +---------+------------------+-----+-----------+--------------------+ PTA      119               1.09 multiphasic                     +---------+------------------+-----+-----------+--------------------+ DP       124               1.14 multiphasic                     +---------+------------------+-----+-----------+--------------------+ Great Toe117                    Normal                          +---------+------------------+-----+-----------+--------------------+ +---------+------------------+-----+-----------+-------+ Left     Lt Pressure (mmHg)IndexWaveform   Comment +---------+------------------+-----+-----------+-------+ Brachial 109                    multiphasic        +---------+------------------+-----+-----------+-------+ PTA      116               1.06 multiphasic        +---------+------------------+-----+-----------+-------+ DP       107               0.98 multiphasic        +---------+------------------+-----+-----------+-------+ Andreas Ohm                     Normal             +---------+------------------+-----+-----------+-------+ +-------+---------------+----------------+ ABI/TBIToday's ABI/TBIPrevious ABI/TBI +-------+---------------+----------------+ Right  1.14/1.07                       +-------+---------------+----------------+ Left   1.06/0.79                       +-------+---------------+----------------+  Summary: Right Carotid: The extracranial vessels were near-normal with only minimal wall                thickening or plaque. Left Carotid: The extracranial vessels were near-normal with  only minimal wall               thickening or plaque. Vertebrals:  Left vertebral artery demonstrates antegrade flow. Right vertebral              artery demonstrates an occlusion. Subclavians: Normal flow hemodynamics were seen in bilateral subclavian              arteries.  *See table(s) above for measurements and observations. Right ABI: Resting right ankle-brachial index is within normal range. The right toe-brachial index is normal. Left ABI: Resting left ankle-brachial index is within normal range. The left toe-brachial index is normal.  Electronically signed by Lemar Livings MD on 04/14/2023 at 5:42:38 PM.    Final    VAS [place] LOWER EXTREMITY VENOUS (DVT) Result Date: 04/14/2023  Lower Venous DVT Study Patient Name:  Kenneth [name]  Date of Exam:   04/13/2023 Medical Rec #: [id number]     Accession #:    [id number] Date of Birth: Oct 19, 1965     Patient Gender: M Patient Age:   19 years Exam Location:  [facility] Procedure:      VAS [place] LOWER EXTREMITY VENOUS (DVT) Referring Phys: Reuel Boom BENSIMHON --------------------------------------------------------------------------------  Indications: Pre-op VAD workup.  Comparison Study: No prior  study on file Performing Technologist: Sherren Kerns RVS  Examination Guidelines: A complete evaluation includes B-mode imaging, spectral Doppler, color Doppler, and power Doppler as needed of all accessible portions of each vessel. Bilateral testing is considered an integral part of a complete examination. Limited examinations for reoccurring indications may be performed as noted. The reflux portion of the exam is performed with the patient in reverse Trendelenburg.  +---------+---------------+---------+-----------+---------------+-------------+ RIGHT    CompressibilityPhasicitySpontaneityProperties     Thrombus                                                                 Aging          +---------+---------------+---------+-----------+---------------+-------------+ CFV      Full           Yes      No         pulsatile                                                                waveform                     +---------+---------------+---------+-----------+---------------+-------------+ SFJ      Full                                                            +---------+---------------+---------+-----------+---------------+-------------+ FV Prox  Full                                                            +---------+---------------+---------+-----------+---------------+-------------+ FV Mid   Full                                                            +---------+---------------+---------+-----------+---------------+-------------+ FV DistalFull           Yes      Yes        pulsatile                                                                waveform                     +---------+---------------+---------+-----------+---------------+-------------+ PFV      Full                                                            +---------+---------------+---------+-----------+---------------+-------------+  POP      Full           Yes      No         pulsatile                                                                waveform                     +---------+---------------+---------+-----------+---------------+-------------+ PTV      Full                                                            +---------+---------------+---------+-----------+---------------+-------------+ PERO     Full                                                            +---------+---------------+---------+-----------+---------------+-------------+ Gastroc  Full                                                            +---------+---------------+---------+-----------+---------------+-------------+    +---------+---------------+---------+-----------+---------------+-------------+ LEFT     CompressibilityPhasicitySpontaneityProperties     Thrombus                                                                 Aging         +---------+---------------+---------+-----------+---------------+-------------+ CFV      Full           Yes      No         pulsatile                                                                waveform                     +---------+---------------+---------+-----------+---------------+-------------+ SFJ      Full                                                            +---------+---------------+---------+-----------+---------------+-------------+ FV Prox  Full                                                            +---------+---------------+---------+-----------+---------------+-------------+  FV Mid   Full                                                            +---------+---------------+---------+-----------+---------------+-------------+ FV DistalFull                                                            +---------+---------------+---------+-----------+---------------+-------------+ PFV      Full                                                            +---------+---------------+---------+-----------+---------------+-------------+ POP      Full           Yes      No         pulsatile                                                                waveform                     +---------+---------------+---------+-----------+---------------+-------------+ PTV      Full                                                            +---------+---------------+---------+-----------+---------------+-------------+ PERO     Full                                                            +---------+---------------+---------+-----------+---------------+-------------+ Gastroc  Full                                                             +---------+---------------+---------+-----------+---------------+-------------+     Summary: RIGHT: - There is no evidence of deep vein thrombosis in the lower extremity.  - No cystic structure found in the popliteal fossa. Pulsatile waveforms  LEFT: - There is no evidence of deep vein thrombosis in the lower extremity.  - No cystic structure found in the popliteal fossa. Pulsatile waveforms.  *See table(s) above for measurements and observations. Electronically signed by Lemar Livings MD on 04/14/2023 at 5:39:18 PM.    Final     Medications:   Scheduled Medications:  amiodarone  200 mg Oral BID   aspirin  81 mg Oral Daily   atorvastatin  80 mg Oral Daily   busPIRone  5 mg Oral BID   Chlorhexidine Gluconate Cloth  6 each Topical Daily   digoxin  0.125 mg Oral Daily   insulin aspart  0-5 Units Subcutaneous QHS   insulin aspart  0-9 Units Subcutaneous Q4H   insulin aspart  7 Units Subcutaneous TID WC   [START ON 04/16/2023] insulin glargine-yfgn  16 Units Subcutaneous Daily   insulin glargine-yfgn  4 Units Subcutaneous Once   losartan  12.5 mg Oral Daily   melatonin  2.5 mg Oral QHS   multivitamin with minerals  1 tablet Oral Daily   pantoprazole  40 mg Oral Daily   sodium chloride flush  10-40 mL Intracatheter Q12H   sodium chloride flush  3 mL Intravenous Q12H   spironolactone  12.5 mg Oral Daily    Infusions:  heparin 1,600 Units/hr (04/15/23 0542)   milrinone 0.25 mcg/kg/min (04/14/23 1816)    PRN Medications: sodium chloride flush, sodium chloride flush   Quantarius R Montrose is a 58 y.o. male with a PMH of CAD with PCI to the RCA and OM, chronic heart failure with reduced ejection fraction, diabetes, hyperlipidemia, hypertension, prior CVA, tobacco use who was admitted to [facility] w/ acute on chronic systolic heart failure w/ NYHA IV symptoms.  Has had progressive decline in functional status/exercise tolerance over the last year.     Assessment/Plan:  1. Acute on chronic systolic CHF: ?Primarily ischemic cardiomyopathy.  Echo in 8/24 at time of last NSTEMI with EF 25-30%.  Echo this admission with EF 10-15%, severe LV dilation with LV thrombus, mild RV dysfunction.  His cardiomyopathy does seem out of proportion to his coronary disease.  He does not drink ETOH or use drugs.  No family history of dilated cardiomyopathy (FH of CAD).  He does have uncontrolled DM as well as a chronic wide LBBB (176 msec) which could contribute. Also noted to have frequent PVCs.  PCWP elevated on RHC, CI low at 1.67.   - Currently on Milrinone 0.25 mcg/kg/min Co-ox 58%. Estimated Fick 1.6. May need to increase milrinone. Will check co-ox later this afternoon.  - Volume status stable. Start Torsemide 20 mg daily - Continue digoxin 0.125 daily.  - Continue losartan 12.5 daily - Continue spironolactone 12.5 daily.  - Hold Toprol XL for now with low output.  - Now undergoing LVAD work up.  He has low output HF with quite low EF.  Not a transplant candidate at this time with active smoking prior to admission.  Barriers for LVAD will be poor DM control (hgbA1c 12) and lack of insurance.   As above, his cardiomyopathy appears out of proportion to CAD. Also has wide LBBB + uncontrolled DM + PVCs may all play a role. Pending cardiac MRI today.  - PFTs today  2. LV thrombus: continue heparin  3. PVCs/NSVT: Patient noted to have frequent PVCs on telemetry.   - With addition of milrinone, now on amiodarone 200 mg po bid.  - NSVT stable  4. CAD: Remote PCI to RCA, cath in 8/24 showed chronically occluded proximal RCA.  NSTEMI in 8/24 with occluded OM1 treated with DES.  LAD with minimal disease.  No chest pain currently.  HS-TnI not significantly elevated, no ACS this admission.  - He is now off Plavix (> 6 months post PCI).  - Continue ASA 81.  - Continue statin.  5. DM2: Poor control, hgbA1c 12.   - Diabetes coordinator consult. - On standing  insulin now + SSI.   6. Smoking: He has quit  7. Will need social worker to help [place] with getting insurance set up.   Order in for nursing staff, ok for patient to get some fresh air.    Length of Stay: 3   Alen Bleacher AGACNP-BC  04/15/2023, 10:17 AM  Advanced Heart Failure Team Pager [phone] (M-F; 7a - 4p)  Please contact [facility] for night-coverage after hours (4p -7a ) and weekends on [email or web address]

## 2023-04-15 NOTE — Plan of Care (Signed)

## 2023-04-16 ENCOUNTER — Inpatient Hospital Stay (HOSPITAL_COMMUNITY): Payer: Self-pay

## 2023-04-16 DIAGNOSIS — I255 Ischemic cardiomyopathy: Secondary | ICD-10-CM

## 2023-04-16 DIAGNOSIS — I5023 Acute on chronic systolic (congestive) heart failure: Secondary | ICD-10-CM

## 2023-04-16 LAB — PULMONARY FUNCTION TEST
DL/VA % pred: 86 %
DL/VA: 3.69 ml/min/mmHg/L
DLCO cor % pred: 87 %
DLCO cor: 23.74 ml/min/mmHg
DLCO unc % pred: 90 %
DLCO unc: 24.44 ml/min/mmHg
FEF 25-75 Pre: 3.45 L/s
FEF2575-%Pred-Pre: 115 %
FEV1-%Pred-Pre: 94 %
FEV1-Pre: 3.36 L
FEV1FVC-%Pred-Pre: 107 %
FEV6-%Pred-Pre: 91 %
FEV6-Pre: 4.09 L
FEV6FVC-%Pred-Pre: 104 %
FVC-%Pred-Pre: 87 %
FVC-Pre: 4.1 L
Pre FEV1/FVC ratio: 82 %
Pre FEV6/FVC Ratio: 100 %
RV % pred: 107 %
RV: 2.32 L
TLC % pred: 97 %
TLC: 6.6 L

## 2023-04-16 LAB — BASIC METABOLIC PANEL
Anion gap: 7 (ref 5–15)
BUN: 20 mg/dL (ref 6–20)
CO2: 25 mmol/L (ref 22–32)
Calcium: 8.6 mg/dL — ABNORMAL LOW (ref 8.9–10.3)
Chloride: 104 mmol/L (ref 98–111)
Creatinine, Ser: 1.04 mg/dL (ref 0.61–1.24)
GFR, Estimated: >60 mL/min (ref >60)
Glucose, Bld: 249 mg/dL — ABNORMAL HIGH (ref 70–99)
Potassium: 3.9 mmol/L (ref 3.5–5.1)
Sodium: 136 mmol/L (ref 135–145)

## 2023-04-16 LAB — CBC
HCT: 47.7 % (ref 39.0–52.0)
Hemoglobin: 15.7 g/dL (ref 13.0–17.0)
MCH: 29 pg (ref 26.0–34.0)
MCHC: 32.9 g/dL (ref 30.0–36.0)
MCV: 88.2 fL (ref 80.0–100.0)
Platelets: 328 10*3/uL (ref 150–400)
RBC: 5.41 MIL/uL (ref 4.22–5.81)
RDW: 14.9 % (ref 11.5–15.5)
WBC: 9.2 10*3/uL (ref 4.0–10.5)
nRBC: 0 % (ref 0.0–0.2)

## 2023-04-16 LAB — COOXEMETRY PANEL
Carboxyhemoglobin: 1.1 % (ref 0.5–1.5)
Methemoglobin: <0.7 % (ref 0.0–1.5)
O2 Saturation: 60.3 %
Total hemoglobin: 15.9 g/dL (ref 12.0–16.0)

## 2023-04-16 LAB — GLUCOSE, CAPILLARY
Glucose-Capillary: 164 mg/dL — ABNORMAL HIGH (ref 70–99)
Glucose-Capillary: 180 mg/dL — ABNORMAL HIGH (ref 70–99)
Glucose-Capillary: 207 mg/dL — ABNORMAL HIGH (ref 70–99)
Glucose-Capillary: 207 mg/dL — ABNORMAL HIGH (ref 70–99)
Glucose-Capillary: 208 mg/dL — ABNORMAL HIGH (ref 70–99)

## 2023-04-16 LAB — HEPARIN LEVEL (UNFRACTIONATED)
Heparin Unfractionated: 0.83 [IU]/mL — ABNORMAL HIGH (ref 0.30–0.70)
Heparin Unfractionated: 0.58 [IU]/mL (ref 0.30–0.70)

## 2023-04-16 LAB — MAGNESIUM: Magnesium: 1.8 mg/dL (ref 1.7–2.4)

## 2023-04-16 MED ORDER — INSULIN ASPART 100 UNIT/ML IJ SOLN
10.0000 [IU] | Freq: Three times a day (TID) | INTRAMUSCULAR | Status: DC
  Administered 2023-04-16 – 2023-04-17 (×3): 10 [IU] via SUBCUTANEOUS

## 2023-04-16 MED ORDER — INSULIN GLARGINE-YFGN 100 UNIT/ML ~~LOC~~ SOLN
20.0000 [IU] | Freq: Every day | SUBCUTANEOUS | Status: DC
  Administered 2023-04-16: 20 [IU] via SUBCUTANEOUS
  Filled 2023-04-16: qty 0.2

## 2023-04-16 MED ORDER — SPIRONOLACTONE 25 MG PO TABS
25.0000 mg | ORAL_TABLET | Freq: Every day | ORAL | Status: DC
  Administered 2023-04-16 – 2023-04-25 (×10): 25 mg via ORAL
  Filled 2023-04-16 (×10): qty 1

## 2023-04-16 MED ORDER — POTASSIUM CHLORIDE CRYS ER 20 MEQ PO TBCR
20.0000 meq | EXTENDED_RELEASE_TABLET | Freq: Once | ORAL | Status: AC
  Administered 2023-04-16: 20 meq via ORAL
  Filled 2023-04-16: qty 1

## 2023-04-16 MED ORDER — INSULIN ASPART 100 UNIT/ML IJ SOLN
0.0000 [IU] | Freq: Three times a day (TID) | INTRAMUSCULAR | Status: DC
  Administered 2023-04-17: 7 [IU] via SUBCUTANEOUS
  Administered 2023-04-17 (×2): 1 [IU] via SUBCUTANEOUS
  Administered 2023-04-18: 2 [IU] via SUBCUTANEOUS
  Administered 2023-04-18: 3 [IU] via SUBCUTANEOUS
  Administered 2023-04-18: 2 [IU] via SUBCUTANEOUS
  Administered 2023-04-19: 1 [IU] via SUBCUTANEOUS
  Administered 2023-04-19: 3 [IU] via SUBCUTANEOUS
  Administered 2023-04-19: 5 [IU] via SUBCUTANEOUS
  Administered 2023-04-20 (×2): 3 [IU] via SUBCUTANEOUS
  Administered 2023-04-21: 2 [IU] via SUBCUTANEOUS
  Administered 2023-04-21: 9 [IU] via SUBCUTANEOUS
  Administered 2023-04-22: 5 [IU] via SUBCUTANEOUS
  Administered 2023-04-22 – 2023-04-23 (×2): 2 [IU] via SUBCUTANEOUS
  Administered 2023-04-23: 5 [IU] via SUBCUTANEOUS
  Administered 2023-04-23: 2 [IU] via SUBCUTANEOUS
  Administered 2023-04-24: 5 [IU] via SUBCUTANEOUS
  Administered 2023-04-24: 7 [IU] via SUBCUTANEOUS
  Administered 2023-04-25 (×3): 2 [IU] via SUBCUTANEOUS

## 2023-04-16 MED ORDER — MAGNESIUM SULFATE 2 GM/50ML IV SOLN
2.0000 g | Freq: Once | INTRAVENOUS | Status: AC
  Administered 2023-04-16: 2 g via INTRAVENOUS
  Filled 2023-04-16: qty 50

## 2023-04-16 MED ORDER — INSULIN GLARGINE-YFGN 100 UNIT/ML ~~LOC~~ SOLN
10.0000 [IU] | Freq: Once | SUBCUTANEOUS | Status: AC
  Administered 2023-04-17: 10 [IU] via SUBCUTANEOUS
  Filled 2023-04-16: qty 0.1

## 2023-04-16 MED ORDER — SACUBITRIL-VALSARTAN 24-26 MG PO TABS
1.0000 | ORAL_TABLET | Freq: Two times a day (BID) | ORAL | Status: DC
  Administered 2023-04-16 – 2023-04-25 (×19): 1 via ORAL
  Filled 2023-04-16 (×19): qty 1

## 2023-04-16 MED ORDER — INSULIN GLARGINE-YFGN 100 UNIT/ML ~~LOC~~ SOLN
20.0000 [IU] | Freq: Every day | SUBCUTANEOUS | Status: DC
  Filled 2023-04-16: qty 0.2

## 2023-04-16 NOTE — Inpatient Diabetes Management (Signed)
 Inpatient Diabetes Program Recommendations  AACE/ADA: New Consensus Statement on Inpatient Glycemic Control (2015)  Target Ranges:  Prepandial:   less than 140 mg/dL      Peak postprandial:   less than 180 mg/dL (1-2 hours)      Critically ill patients:  140 - 180 mg/dL   Lab Results  Component Value Date   GLUCAP 207 (H) 04/16/2023   HGBA1C 12.0 (H) 04/09/2023    Review of Glycemic Control  Diabetes history: DM 2 Outpatient Diabetes medications: metformin  Current orders for Inpatient glycemic control:  [place] 20 units qhs Novolog 0-9 units tid + hs Novolog 10 units tid meal coverage   Pt gone to a test. Spoke with wife at bedside regarding insulin changes and glucose trends. Will see pt tomorrow. Thanks,  Christena Deem RN, MSN, BC-ADM Inpatient Diabetes Coordinator Team Pager [phone] (8a-5p)

## 2023-04-16 NOTE — Consult Note (Addendum)
 [address]       Kenneth [name] [address]             [phone]      Cardiothoracic Surgery Consultation   Reason for Consult: Stage D / NYHA IV acute on chronic systolic heart failure. Referring Physician: Dr. Clearnce Hasten  Kenneth [name] is an 58 y.o. male.  HPI:   The patient is a 58 year old gentleman with history of hypertension, hyperlipidemia, poorly controlled diabetes, coronary artery disease status post PCI of the RCA and OM, chronic heart failure with reduced ejection fraction, prior CVA in 2019, and a long smoking history until January 2025 who reports a rapidly progressive course of exertional fatigue and shortness of breath since the middle of December 2024.  He tells me that he felt fine in November but around the middle of December noted that he felt congested.  This continued worsening and he was getting short of breath with low-level activity and sleeping a lot more than he usually did.  He presented to [facility] last week.  2D echocardiogram showed an ejection fraction of 10 to 15% with a severely dilated LV at 6.7 cm with large LV thrombus.  There was mild RV dysfunction.  He was transferred to [facility] for further optimization with inotropes and diuresis and consideration of LVAD.  A cardiac MR yesterday showed an LVEF of 20% with severe dilation.  Right ventricular size was normal with ejection fraction of 34%.  His most recent left heart catheterization in August 2024 after presenting with myocardial infarction showed occlusion of a large first marginal branch.  There was CTO of the large RCA proximal stent with faint right to right and left-to-right collaterals.  He had successful PCI and stenting of the first marginal branch.   He was started on milrinone 0.25 after transfer to [facility] and has felt much better.  He is up walking around without chest pain or shortness of breath.  The patient lives with his wife in [place].  He is a self-employed heavy  Arboriculturist.  He suffered a stroke in 2017 with left arm and leg weakness and MRI of the brain showing a right sided deep white matter infarct affecting the posterior lentiform nucleus.  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  He completely recovered from the strokes.   Past Medical History:  Diagnosis Date   Coronary artery disease    Diabetes mellitus without complication (HCC)    GERD (gastroesophageal reflux disease)    Hypertension    Stroke (cerebrum) (HCC) 2019. Presented with headache and blurred vision.      Past Surgical History:  Procedure Laterality Date   CORONARY ANGIOPLASTY WITH STENT PLACEMENT  06/18/2008   CORONARY ANGIOPLASTY WITH STENT PLACEMENT     CORONARY STENT INTERVENTION N/A 09/04/2022   Procedure: CORONARY STENT INTERVENTION;  Surgeon: Alwyn Pea, MD;  Location: [facility] INVASIVE CV LAB;  Service: Cardiovascular;  Laterality: N/A;   LEFT HEART CATH AND CORONARY ANGIOGRAPHY N/A 09/04/2022   Procedure: LEFT HEART CATH AND CORONARY ANGIOGRAPHY;  Surgeon: Alwyn Pea, MD;  Location: [facility] INVASIVE CV LAB;  Service: Cardiovascular;  Laterality: N/A;   RIGHT HEART CATH N/A 04/11/2023   Procedure: RIGHT HEART CATH;  Surgeon: Marcina Millard, MD;  Location: [facility] INVASIVE CV LAB;  Service: Cardiovascular;  Laterality:  N/A;   SHOULDER ARTHROSCOPY WITH ROTATOR CUFF REPAIR AND SUBACROMIAL DECOMPRESSION Right 04/19/2017   Procedure: SHOULDER ARTHROSCOPY WITH ROTATOR CUFF REPAIR AND SUBACROMIAL DECOMPRESSION;  Surgeon: Signa Kell, MD;  Location: [facility] ORS;  Service: Orthopedics;  Laterality: Right;  distal clavical excision, bicepts tenotomy    Family History  Problem Relation Age of Onset   Heart attack Mother    Heart failure Mother    Coronary artery disease Father    Diabetes Father      Social History:  reports that he has been smoking cigarettes. His smokeless tobacco use includes chew. He reports that he does not drink alcohol and does not use drugs.  Allergies: No Known Allergies  Medications: I have reviewed the patient's current medications. Prior to Admission:  Medications Prior to Admission  Medication Sig Dispense Refill Last Dose/Taking   albuterol (PROVENTIL) (2.5 MG/3ML) 0.083% nebulizer solution Take 2.5 mg by nebulization every 6 (six) hours as needed for wheezing.   04/08/2023   atorvastatin (LIPITOR) 80 MG tablet Take 1 tablet (80 mg total) by mouth daily at 6 PM.   04/08/2023   busPIRone (BUSPAR) 5 MG tablet Take 1 tablet by mouth 2 (two) times daily.   04/08/2023   acetaminophen (TYLENOL) 325 MG tablet Take 2 tablets (650 mg total) by mouth every 6 (six) hours as needed for mild pain (pain score 1-3) (or Fever >/= 101).      acetaminophen (TYLENOL) 650 MG suppository Place 1 suppository (650 mg total) rectally every 6 (six) hours as needed for mild pain (pain score 1-3) (or Fever >/= 101).      aspirin EC 81 MG tablet Take 1 tablet (81 mg total) by mouth daily. Swallow whole.      digoxin (LANOXIN) 0.125 MG tablet Take 1 tablet (0.125 mg total) by mouth daily.      enoxaparin (LOVENOX) 100 MG/ML injection Inject 1 mL (100 mg total) into the skin daily.      guaiFENesin-dextromethorphan (ROBITUSSIN DM) 100-10 MG/5ML syrup Take 5 mLs by mouth every 4 (four) hours as needed for cough.      heparin 16109 UT/250ML infusion Inject 1,500 Units/hr into the vein continuous.      HYDROcodone-acetaminophen (NORCO/VICODIN) 5-325 MG tablet Take 1-2 tablets by mouth every 4 (four) hours as needed for moderate pain (pain score 4-6).      insulin aspart (NOVOLOG) 100 UNIT/ML injection Inject 0-5 Units into the skin at bedtime.      insulin aspart (NOVOLOG) 100 UNIT/ML injection Inject 0-9 Units into the skin 3 (three) times daily with meals.      insulin aspart (NOVOLOG)  100 UNIT/ML injection Inject 3 Units into the skin 3 (three) times daily with meals.      insulin glargine (LANTUS) 100 UNIT/ML injection Inject 0.1 mLs (10 Units total) into the skin daily.      isosorbide mononitrate (IMDUR) 30 MG 24 hr tablet Take 1 tablet (30 mg total) by mouth daily.      losartan (COZAAR) 25 MG tablet Take 0.5 tablets (12.5 mg total) by mouth daily.      melatonin 5 MG TABS Take 0.5 tablets (2.5 mg total) by mouth at bedtime.      metoprolol succinate (TOPROL-XL) 25 MG 24 hr tablet Take 0.5 tablets (12.5 mg total) by mouth daily. (Patient not taking: Reported on 04/12/2023)   Not Taking   mometasone-formoterol (DULERA) 200-5 MCG/ACT AERO Inhale 2 puffs into the lungs 2 (two) times daily.  Morphine Sulfate (MORPHINE, PF,) 2 MG/ML injection Inject 1 mL (2 mg total) into the vein every 2 (two) hours as needed.      nicotine (NICODERM CQ - DOSED IN MG/24 HOURS) 21 mg/24hr patch Place 1 patch (21 mg total) onto the skin daily. (Patient not taking: Reported on 04/09/2023) 28 patch 0    nitroGLYCERIN (NITROSTAT) 0.4 MG SL tablet Place 1 tablet (0.4 mg total) under the tongue every 5 (five) minutes as needed for chest pain.      ondansetron (ZOFRAN) 4 MG tablet Take 1 tablet (4 mg total) by mouth every 6 (six) hours as needed for nausea.      ondansetron (ZOFRAN) 4 MG/2ML SOLN injection Inject 2 mLs (4 mg total) into the vein every 6 (six) hours as needed for nausea.      pantoprazole (PROTONIX) 40 MG tablet Take 1 tablet (40 mg total) by mouth daily.      spironolactone (ALDACTONE) 25 MG tablet Take 0.5 tablets (12.5 mg total) by mouth daily. (Patient not taking: Reported on 04/12/2023)   Not Taking   warfarin (COUMADIN) 5 MG tablet PHARMACY TO DOSE      Scheduled:  amiodarone  200 mg Oral BID   aspirin EC  81 mg Oral Daily   atorvastatin  80 mg Oral Daily   busPIRone  5 mg Oral BID   Chlorhexidine Gluconate Cloth  6 each Topical Daily   digoxin  0.125 mg Oral Daily   insulin  aspart  0-5 Units Subcutaneous QHS   insulin aspart  0-9 Units Subcutaneous Q4H   insulin aspart  10 Units Subcutaneous TID WC   [START ON 04/17/2023] insulin glargine-yfgn  20 Units Subcutaneous QHS   melatonin  2.5 mg Oral QHS   multivitamin with minerals  1 tablet Oral Daily   mupirocin ointment  1 Application Nasal BID   pantoprazole  40 mg Oral Daily   sacubitril-valsartan  1 tablet Oral BID   sodium chloride flush  10-40 mL Intracatheter Q12H   sodium chloride flush  3 mL Intravenous Q12H   spironolactone  25 mg Oral Daily   torsemide  20 mg Oral Daily   Continuous:  heparin 1,500 Units/hr (04/16/23 0629)   milrinone 0.25 mcg/kg/min (04/16/23 0019)   [id number] chloride flush, sodium chloride flush Anti-infectives (From admission, onward)    None       Results for orders placed or performed during the hospital encounter of 04/12/23 (from the past 48 hours)  Glucose, capillary     Status: Abnormal   Collection Time: 04/14/23  4:18 PM  Result Value Ref Range   Glucose-Capillary 287 (H) 70 - 99 mg/dL    Comment: Glucose reference range applies only to samples taken after fasting for at least 8 hours.  Glucose, capillary     Status: Abnormal   Collection Time: 04/14/23  9:14 PM  Result Value Ref Range   Glucose-Capillary 185 (H) 70 - 99 mg/dL    Comment: Glucose reference range applies only to samples taken after fasting for at least 8 hours.   Comment 1 Notify RN   Basic metabolic panel     Status: Abnormal   Collection Time: 04/15/23  5:50 AM  Result Value Ref Range   Sodium 135 135 - 145 mmol/L   Potassium 4.8 3.5 - 5.1 mmol/L   Chloride 97 (L) 98 - 111 mmol/L   CO2 27 22 - 32 mmol/L   Glucose, Bld 394 (H) 70 - 99 mg/dL  Comment: Glucose reference range applies only to samples taken after fasting for at least 8 hours.   BUN 27 (H) 6 - 20 mg/dL   Creatinine, Ser 1.61 (H) 0.61 - 1.24 mg/dL   Calcium 8.9 8.9 - 09.6 mg/dL   GFR, Estimated >04 >54 mL/min     Comment: (NOTE) Calculated using the CKD-EPI Creatinine Equation (2021)    Anion gap 11 5 - 15    Comment: Performed at [facility], [address], [place], [place] [address]  Cooxemetry Panel (carboxy, met, total hgb, O2 sat)     Status: None   Collection Time: 04/15/23  5:50 AM  Result Value Ref Range   Total hemoglobin 15.9 12.0 - 16.0 g/dL   O2 Saturation 91.4 %   Carboxyhemoglobin 1.0 0.5 - 1.5 %   Methemoglobin <0.7 0.0 - 1.5 %    Comment: Performed at [facility], [address] [place], [place] [address]  CBC     Status: None   Collection Time: 04/15/23  5:50 AM  Result Value Ref Range   WBC 8.3 4.0 - 10.5 K/uL   RBC 5.26 4.22 - 5.81 MIL/uL   Hemoglobin 15.4 13.0 - 17.0 g/dL   HCT 62.1 30.8 - 65.7 %   MCV 88.6 80.0 - 100.0 fL   MCH 29.3 26.0 - 34.0 pg   MCHC 33.0 30.0 - 36.0 g/dL   RDW 84.6 96.2 - 95.2 %   Platelets 306 150 - 400 K/uL   nRBC 0.0 0.0 - 0.2 %    Comment: Performed at [facility], [address] [place], [place] [address]  Magnesium     Status: None   Collection Time: 04/15/23  5:50 AM  Result Value Ref Range   Magnesium 1.8 1.7 - 2.4 mg/dL    Comment: Performed at [facility], [address], [place], [place] [address]  Heparin level (unfractionated)     Status: None   Collection Time: 04/15/23  5:50 AM  Result Value Ref Range   Heparin Unfractionated 0.65 0.30 - 0.70 IU/mL    Comment: (NOTE) The clinical reportable range upper limit is being lowered to >1.10 to align with the FDA approved guidance for the current laboratory assay.  If heparin results are below expected values, and patient dosage has  been confirmed, suggest follow up testing of antithrombin III levels. Performed at [facility], [address], [place], [place] [address]   Glucose, capillary     Status: Abnormal   Collection Time: 04/15/23  6:23 AM  Result Value Ref Range   Glucose-Capillary 375 (H) 70 - 99 mg/dL    Comment: Glucose  reference range applies only to samples taken after fasting for at least 8 hours.   Comment 1 Notify RN   Glucose, capillary     Status: Abnormal   Collection Time: 04/15/23  9:41 AM  Result Value Ref Range   Glucose-Capillary 234 (H) 70 - 99 mg/dL    Comment: Glucose reference range applies only to samples taken after fasting for at least 8 hours.  Glucose, capillary     Status: Abnormal   Collection Time: 04/15/23 11:39 AM  Result Value Ref Range   Glucose-Capillary 253 (H) 70 - 99 mg/dL    Comment: Glucose reference range applies only to samples taken after fasting for at least 8 hours.  Cooxemetry Panel (carboxy, met, total hgb, O2 sat)     Status: None  Collection Time: 04/15/23  2:30 PM  Result Value Ref Range   Total hemoglobin 16.0 12.0 - 16.0 g/dL   O2 Saturation 16.1 %   Carboxyhemoglobin 1.3 0.5 - 1.5 %   Methemoglobin 0.9 0.0 - 1.5 %    Comment: Performed at [facility], [address], [place], [place] [address]  Basic metabolic panel     Status: Abnormal   Collection Time: 04/15/23  2:30 PM  Result Value Ref Range   Sodium 133 (L) 135 - 145 mmol/L   Potassium 4.1 3.5 - 5.1 mmol/L   Chloride 96 (L) 98 - 111 mmol/L   CO2 26 22 - 32 mmol/L   Glucose, Bld 138 (H) 70 - 99 mg/dL    Comment: Glucose reference range applies only to samples taken after fasting for at least 8 hours.   BUN 22 (H) 6 - 20 mg/dL   Creatinine, Ser 5.40 0.61 - 1.24 mg/dL   Calcium 9.3 8.9 - 98.1 mg/dL   GFR, Estimated >19 >14 mL/min    Comment: (NOTE) Calculated using the CKD-EPI Creatinine Equation (2021)    Anion gap 11 5 - 15    Comment: Performed at [facility], [address] [place], [place] [address]  Magnesium     Status: None   Collection Time: 04/15/23  2:30 PM  Result Value Ref Range   Magnesium 2.2 1.7 - 2.4 mg/dL    Comment: Performed at [facility], [address], [place], [place] [address]  Glucose, capillary     Status: Abnormal   Collection Time:  04/15/23  5:17 PM  Result Value Ref Range   Glucose-Capillary 104 (H) 70 - 99 mg/dL    Comment: Glucose reference range applies only to samples taken after fasting for at least 8 hours.  Glucose, capillary     Status: Abnormal   Collection Time: 04/15/23  8:24 PM  Result Value Ref Range   Glucose-Capillary 217 (H) 70 - 99 mg/dL    Comment: Glucose reference range applies only to samples taken after fasting for at least 8 hours.  Glucose, capillary     Status: Abnormal   Collection Time: 04/16/23 12:12 AM  Result Value Ref Range   Glucose-Capillary 180 (H) 70 - 99 mg/dL    Comment: Glucose reference range applies only to samples taken after fasting for at least 8 hours.  Basic metabolic panel     Status: Abnormal   Collection Time: 04/16/23  5:20 AM  Result Value Ref Range   Sodium 136 135 - 145 mmol/L   Potassium 3.9 3.5 - 5.1 mmol/L   Chloride 104 98 - 111 mmol/L   CO2 25 22 - 32 mmol/L   Glucose, Bld 249 (H) 70 - 99 mg/dL    Comment: Glucose reference range applies only to samples taken after fasting for at least 8 hours.   BUN 20 6 - 20 mg/dL   Creatinine, Ser 8.65 0.61 - 1.24 mg/dL   Calcium 8.6 (L) 8.9 - 10.3 mg/dL   GFR, Estimated >78 >46 mL/min    Comment: (NOTE) Calculated using the CKD-EPI Creatinine Equation (2021)    Anion gap 7 5 - 15    Comment: Performed at [facility], [address], [place], [place] [address]  Cooxemetry Panel (carboxy, met, total hgb, O2 sat)     Status: None   Collection Time: 04/16/23  5:20 AM  Result Value Ref Range   Total hemoglobin 15.9  12.0 - 16.0 g/dL   O2 Saturation 16.1 %   Carboxyhemoglobin 1.1 0.5 - 1.5 %   Methemoglobin <0.7 0.0 - 1.5 %    Comment: Performed at [facility], [address], [place], [place] [address]  CBC     Status: None   Collection Time: 04/16/23  5:20 AM  Result Value Ref Range   WBC 9.2 4.0 - 10.5 K/uL   RBC 5.41 4.22 - 5.81 MIL/uL   Hemoglobin 15.7 13.0 - 17.0 g/dL   HCT 54.0 98.1 - 19.1  %   MCV 88.2 80.0 - 100.0 fL   MCH 29.0 26.0 - 34.0 pg   MCHC 32.9 30.0 - 36.0 g/dL   RDW 47.8 29.5 - 62.1 %   Platelets 328 150 - 400 K/uL   nRBC 0.0 0.0 - 0.2 %    Comment: Performed at [facility], [address], [place], [place] [address]  Magnesium     Status: None   Collection Time: 04/16/23  5:20 AM  Result Value Ref Range   Magnesium 1.8 1.7 - 2.4 mg/dL    Comment: Performed at [facility], [address], [place], [place] [address]  Heparin level (unfractionated)     Status: Abnormal   Collection Time: 04/16/23  5:20 AM  Result Value Ref Range   Heparin Unfractionated 0.83 (H) 0.30 - 0.70 IU/mL    Comment: (NOTE) The clinical reportable range upper limit is being lowered to >1.10 to align with the FDA approved guidance for the current laboratory assay.  If heparin results are below expected values, and patient dosage has  been confirmed, suggest follow up testing of antithrombin III levels. Performed at [facility], [address], [place], [place] [address]   Glucose, capillary     Status: Abnormal   Collection Time: 04/16/23  6:29 AM  Result Value Ref Range   Glucose-Capillary 207 (H) 70 - 99 mg/dL    Comment: Glucose reference range applies only to samples taken after fasting for at least 8 hours.  Glucose, capillary     Status: Abnormal   Collection Time: 04/16/23 11:26 AM  Result Value Ref Range   Glucose-Capillary 208 (H) 70 - 99 mg/dL    Comment: Glucose reference range applies only to samples taken after fasting for at least 8 hours.  Heparin level (unfractionated)     Status: None   Collection Time: 04/16/23  1:43 PM  Result Value Ref Range   Heparin Unfractionated 0.58 0.30 - 0.70 IU/mL    Comment: (NOTE) The clinical reportable range upper limit is being lowered to >1.10 to align with the FDA approved guidance for the current laboratory assay.  If heparin results are below expected values, and patient dosage has  been confirmed,  suggest follow up testing of antithrombin III levels. Performed at [facility], [address] [place], [place] [address]     MR CARDIAC VELOCITY FLOW MAP Result Date: 04/15/2023 CLINICAL DATA:  Cardiomyopathy of uncertain etiology EXAM: CARDIAC MRI TECHNIQUE: The patient was scanned on a 1.5 Tesla GE magnet. A dedicated cardiac coil was used. Functional imaging was done using Fiesta sequences. 2,3, and 4 chamber views were done to assess for RWMA's. Modified Simpson's rule using a short axis stack was used to calculate an ejection fraction on a dedicated work [employer]. The patient received 10 cc of Gadavist. After 10 minutes inversion recovery sequences were used to assess for  infiltration and scar tissue. FINDINGS: Limited images of the lung fields showed no gross abnormalities. Small pericardial effusion. Severely dilated left ventricle with normal wall thickness. Global hypokinesis with septal-lateral dyssynchrony consistent with LBBB and inferior akinesis, LV EF 20%. Normal RV size with RV EF 34%. Mildly dilated left atrium, normal right atrium. Tricuspid aortic valve with no stenosis or regurgitation noted. No significant mitral regurgitation noted. On delayed enhancement imaging, there is somewhat subtle mid-wall late gadolinium enhancement (LGE) in the basal to mid septum. >50% wall thickness subendocardial LGE in the basal to mid inferior wall, about 50% wall thickness LGE in the mid inferolateral wall. MEASUREMENTS: MEASUREMENTS LVEDV 335 mL LVEDVi 183 mL/m2 LVSV 66 mL LVEF 20% RVEDV 180 mL RVEDVi 98 mL/m2 RVSV 62 mL RVEF 34% Aortic forward volume 67 mL Aortic regurgitant fraction 1% T1 1117, ECV 35% IMPRESSION: 1. Severely dilated LV. Global hypokinesis with septal-lateral dyssynchrony consistent with LBBB, inferior akinesis also noted. LV EF 20%. 2.  Normal RV size with RV EF 34%. 3. LGE in the basal to mid inferior wall and the mid inferolateral wall suggestive of  prior subendocardial MI. There is also subtle mid-wall LGE in the basal to mid septum. This can be nonspecific and seen with dilated cardiomyopathy with volume overload. I do not think that MI-type scar can explain the degree of cardiomyopathy. 4. Mildly elevated extracellular volume percentage suggestive of elevated myocardial fibrotic content (can be explained by area of subendocardial MI). Dalton Mclean Electronically Signed   By: Marca Ancona M.D.   On: 04/15/2023 17:37   MR CARDIAC VELOCITY FLOW MAP Result Date: 04/15/2023 CLINICAL DATA:  Cardiomyopathy of uncertain etiology EXAM: CARDIAC MRI TECHNIQUE: The patient was scanned on a 1.5 Tesla GE magnet. A dedicated cardiac coil was used. Functional imaging was done using Fiesta sequences. 2,3, and 4 chamber views were done to assess for RWMA's. Modified Simpson's rule using a short axis stack was used to calculate an ejection fraction on a dedicated work [employer] officer, [employer]. The patient received 10 cc of Gadavist. After 10 minutes inversion recovery sequences were used to assess for infiltration and scar tissue. FINDINGS: Limited images of the lung fields showed no gross abnormalities. Small pericardial effusion. Severely dilated left ventricle with normal wall thickness. Global hypokinesis with septal-lateral dyssynchrony consistent with LBBB and inferior akinesis, LV EF 20%. Normal RV size with RV EF 34%. Mildly dilated left atrium, normal right atrium. Tricuspid aortic valve with no stenosis or regurgitation noted. No significant mitral regurgitation noted. On delayed enhancement imaging, there is somewhat subtle mid-wall late gadolinium enhancement (LGE) in the basal to mid septum. >50% wall thickness subendocardial LGE in the basal to mid inferior wall, about 50% wall thickness LGE in the mid inferolateral wall. MEASUREMENTS: MEASUREMENTS LVEDV 335 mL LVEDVi 183 mL/m2 LVSV 66 mL LVEF 20% RVEDV 180 mL RVEDVi 98 mL/m2 RVSV 62 mL RVEF 34% Aortic  forward volume 67 mL Aortic regurgitant fraction 1% T1 1117, ECV 35% IMPRESSION: 1. Severely dilated LV. Global hypokinesis with septal-lateral dyssynchrony consistent with LBBB, inferior akinesis also noted. LV EF 20%. 2.  Normal RV size with RV EF 34%. 3. LGE in the basal to mid inferior wall and the mid inferolateral wall suggestive of prior subendocardial MI. There is also subtle mid-wall LGE in the basal to mid septum. This can be nonspecific and seen with dilated cardiomyopathy with volume overload. I do not think that MI-type scar can explain the degree of cardiomyopathy. 4. Mildly elevated extracellular  volume percentage suggestive of elevated myocardial fibrotic content (can be explained by area of subendocardial MI). Dalton Mclean Electronically Signed   By: Marca Ancona M.D.   On: 04/15/2023 17:37   MR CARDIAC MORPHOLOGY W WO CONTRAST Result Date: 04/15/2023 CLINICAL DATA:  Cardiomyopathy of uncertain etiology EXAM: CARDIAC MRI TECHNIQUE: The patient was scanned on a 1.5 Tesla GE magnet. A dedicated cardiac coil was used. Functional imaging was done using Fiesta sequences. 2,3, and 4 chamber views were done to assess for RWMA's. Modified Simpson's rule using a short axis stack was used to calculate an ejection fraction on a dedicated work [employer] officer, [employer]. The patient received 10 cc of Gadavist. After 10 minutes inversion recovery sequences were used to assess for infiltration and scar tissue. FINDINGS: Limited images of the lung fields showed no gross abnormalities. Small pericardial effusion. Severely dilated left ventricle with normal wall thickness. Global hypokinesis with septal-lateral dyssynchrony consistent with LBBB and inferior akinesis, LV EF 20%. Normal RV size with RV EF 34%. Mildly dilated left atrium, normal right atrium. Tricuspid aortic valve with no stenosis or regurgitation noted. No significant mitral regurgitation noted. On delayed enhancement imaging, there is  somewhat subtle mid-wall late gadolinium enhancement (LGE) in the basal to mid septum. >50% wall thickness subendocardial LGE in the basal to mid inferior wall, about 50% wall thickness LGE in the mid inferolateral wall. MEASUREMENTS: MEASUREMENTS LVEDV 335 mL LVEDVi 183 mL/m2 LVSV 66 mL LVEF 20% RVEDV 180 mL RVEDVi 98 mL/m2 RVSV 62 mL RVEF 34% Aortic forward volume 67 mL Aortic regurgitant fraction 1% T1 1117, ECV 35% IMPRESSION: 1. Severely dilated LV. Global hypokinesis with septal-lateral dyssynchrony consistent with LBBB, inferior akinesis also noted. LV EF 20%. 2.  Normal RV size with RV EF 34%. 3. LGE in the basal to mid inferior wall and the mid inferolateral wall suggestive of prior subendocardial MI. There is also subtle mid-wall LGE in the basal to mid septum. This can be nonspecific and seen with dilated cardiomyopathy with volume overload. I do not think that MI-type scar can explain the degree of cardiomyopathy. 4. Mildly elevated extracellular volume percentage suggestive of elevated myocardial fibrotic content (can be explained by area of subendocardial MI). Dalton Mclean Electronically Signed   By: Marca Ancona M.D.   On: 04/15/2023 17:37    Review of Systems  Constitutional:  Positive for activity change and fatigue.  HENT: Negative.    Eyes: Negative.   Respiratory:  Positive for shortness of breath.   Cardiovascular:  Negative for chest pain and leg swelling.  Gastrointestinal: Negative.   Endocrine: Negative.   Genitourinary: Negative.   Musculoskeletal: Negative.   Allergic/Immunologic: Negative.   Neurological:  Negative for dizziness and syncope.  Hematological: Negative.   Psychiatric/Behavioral: Negative.     Blood pressure 113/80, pulse 91, temperature 98.6 F (37 C), temperature source Oral, resp. rate 18, height 5\' 9"  (1.753 m), weight 70.8 kg, SpO2 96%. Physical Exam Constitutional:      Appearance: Normal appearance. He is normal weight.  HENT:     Head:  Normocephalic and atraumatic.     Mouth/Throat:     Mouth: Mucous membranes are moist.     Pharynx: Oropharynx is clear.  Eyes:     Extraocular Movements: Extraocular movements intact.     Conjunctiva/sclera: Conjunctivae normal.     Pupils: Pupils are equal, round, and reactive to light.  Neck:     Vascular: No carotid bruit.  Cardiovascular:  Rate and Rhythm: Normal rate and regular rhythm.     Pulses: Normal pulses.     Heart sounds: Normal heart sounds. No murmur heard. Pulmonary:     Effort: Pulmonary effort is normal.     Breath sounds: Normal breath sounds.  Abdominal:     General: Abdomen is flat. Bowel sounds are normal. There is no distension.     Palpations: Abdomen is soft.     Tenderness: There is no abdominal tenderness.  Musculoskeletal:        General: No swelling.  Skin:    General: Skin is warm and dry.  Neurological:     General: No focal deficit present.     Mental Status: He is alert and oriented to person, place, and time.  Psychiatric:        Mood and Affect: Mood normal.        Behavior: Behavior normal.    Narrative & Impression  CLINICAL DATA:  [id number] Preoperative evaluation to rule out surgical contraindication [id number]   EXAM: ORTHOPANTOGRAM/PANORAMIC   COMPARISON:  None Available.   FINDINGS: No fracture significant osseous abnormality. No retained radiopaque foreign body. Missing left mandibular molar. Well-aerated partially visualized maxillary sinuses.   IMPRESSION: Negative.     Electronically Signed   By: Tish Frederickson M.D.   On: 04/12/2023 21:27    Narrative & Impression  CLINICAL DATA:  Cardiomyopathy of uncertain etiology   EXAM: CARDIAC MRI   TECHNIQUE: The patient was scanned on a 1.5 Tesla GE magnet. A dedicated cardiac coil was used. Functional imaging was done using Fiest[employer] sequences. 2,3, and 4 chamber views were done to assess for RWMA's. Modified Simpson's rule using a short axis stack was used  to calculate an ejection fraction on a dedicated work [employer]. The patient received 10 cc of Gadavist. After 10 minutes inversion recovery sequences were used to assess for infiltration and scar tissue.   FINDINGS: Limited images of the lung fields showed no gross abnormalities.   Small pericardial effusion. Severely dilated left ventricle with normal wall thickness. Global hypokinesis with septal-lateral dyssynchrony consistent with LBBB and inferior akinesis, LV EF 20%. Normal RV size with RV EF 34%. Mildly dilated left atrium, normal right atrium. Tricuspid aortic valve with no stenosis or regurgitation noted. No significant mitral regurgitation noted.   On delayed enhancement imaging, there is somewhat subtle mid-wall late gadolinium enhancement (LGE) in the basal to mid septum. >50% wall thickness subendocardial LGE in the basal to mid inferior wall, about 50% wall thickness LGE in the mid inferolateral wall.   MEASUREMENTS: MEASUREMENTS LVEDV 335 mL   LVEDVi 183 mL/m2   LVSV 66 mL LVEF 20%   RVEDV 180 mL RVEDVi 98 mL/m2   RVSV 62 mL RVEF 34%   Aortic forward volume 67 mL   Aortic regurgitant fraction 1%   T1 1117, ECV 35%   IMPRESSION: 1. Severely dilated LV. Global hypokinesis with septal-lateral dyssynchrony consistent with LBBB, inferior akinesis also noted. LV EF 20%.   2.  Normal RV size with RV EF 34%.   3. LGE in the basal to mid inferior wall and the mid inferolateral wall suggestive of prior subendocardial MI. There is also subtle mid-wall LGE in the basal to mid septum. This can be nonspecific and seen with dilated cardiomyopathy with volume overload. I do not think that MI-type scar can explain the degree of cardiomyopathy.   4. Mildly elevated extracellular volume percentage suggestive of elevated myocardial fibrotic content (  can be explained by area of subendocardial MI).   Dalton Mclean     Electronically Signed    By: Marca Ancona M.D.   On: 04/15/2023 17:37      ECHOCARDIOGRAM REPORT       Patient Name:   RAYMONT ANDREONI Date of Exam: 04/09/2023 Medical Rec #:  [id number]    Height:       69.0 in Accession #:    [id number]   Weight:       168.0 lb Date of Birth:  1965-02-25    BSA:          1.918 m Patient Age:    57 years     BP:           100/88 mmHg Patient Gender: M            HR:           100 bpm. Exam Location:  [facility]  Procedure: 2D Echo, Cardiac Doppler, Color Doppler, Strain Analysis and 3D Echo            (Both Spectral and Color Flow Doppler were utilized during            procedure).  Indications:     CHF-acute systolic I50.21   History:         Patient has prior history of Echocardiogram examinations, most                  recent 09/04/2022. CAD, Stroke; Risk Factors:Diabetes and                  Hypertension.   Sonographer:     Cristela Blue Referring Phys:  [id number] VIP[facility] Diagnosing Phys: Windell Norfolk    Sonographer Comments: Global longitudinal strain was attempted. IMPRESSIONS    1. Large LV thrombus noted. Left ventricular ejection fraction, by estimation, is 10-15%%. The left ventricle has severely decreased function. The left ventricle demonstrates regional wall motion abnormalities (see scoring diagram/findings for description). The left ventricular internal cavity size was severely dilated. Left ventricular diastolic parameters are consistent with Grade II diastolic dysfunction (pseudonormalization).  2. Right ventricular systolic function is mildly reduced. The right ventricular size is mildly enlarged. There is moderately elevated pulmonary artery systolic pressure. The estimated right ventricular systolic pressure is 45.4 mmHg.  3. Left atrial size was mildly dilated.  4. The mitral valve is normal in structure. Mild mitral valve regurgitation.  5. The aortic valve is tricuspid. There is mild thickening of the aortic valve. Aortic valve regurgitation  is trivial.  FINDINGS  Left Ventricle: Large LV thrombus noted. Left ventricular ejection fraction, by estimation, is 10-15%%. The left ventricle has severely decreased function. The left ventricle demonstrates regional wall motion abnormalities. The left ventricular internal  cavity size was severely dilated. There is no left ventricular hypertrophy. Left ventricular diastolic parameters are consistent with Grade II diastolic dysfunction (pseudonormalization).    LV Wall Scoring: The mid and distal anterior septum, entire apex, entire inferior wall, and mid inferoseptal segment are akinetic. The anterior wall, antero-lateral wall, posterior wall, basal anteroseptal segment, and basal inferoseptal segment are hypokinetic.  Right Ventricle: The right ventricular size is mildly enlarged. No increase in right ventricular wall thickness. Right ventricular systolic function is mildly reduced. There is moderately elevated pulmonary artery systolic pressure. The tricuspid regurgitant velocity is 3.18 m/s, and with an assumed right atrial pressure of 5 mmHg, the estimated right ventricular systolic pressure is 45.4 mmHg.  Left  Atrium: Left atrial size was mildly dilated.  Right Atrium: Right atrial size was normal in size.  Pericardium: There is no evidence of pericardial effusion.  Mitral Valve: The mitral valve is normal in structure. Mild mitral valve regurgitation.  Tricuspid Valve: The tricuspid valve is normal in structure. Tricuspid valve regurgitation is mild.  Aortic Valve: The aortic valve is tricuspid. There is mild thickening of the aortic valve. Aortic valve regurgitation is trivial. Aortic valve mean gradient measures 1.0 mmHg. Aortic valve peak gradient measures 1.5 mmHg. Aortic valve area, by VTI measures 3.71 cm.  Pulmonic Valve: The pulmonic valve was not well visualized. Pulmonic valve regurgitation is trivial.  Aorta: The aortic root is normal in size and  structure.  IAS/Shunts: The interatrial septum was not well visualized.    LEFT VENTRICLE PLAX 2D LVIDd:         6.70 cm      Diastology LVIDs:         6.50 cm      LV e' medial:    4.68 cm/s LV PW:         1.10 cm      LV E/e' medial:  15.3 LV IVS:        0.80 cm      LV e' lateral:   4.57 cm/s LVOT diam:     2.20 cm      LV E/e' lateral: 15.7 LV SV:         26 LV SV Index:   14 LVOT Area:     3.80 cm   LV Volumes (MOD) LV vol d, MOD A2C: 202.0 ml LV vol d, MOD A4C: 279.0 ml LV vol s, MOD A2C: 189.0 ml LV vol s, MOD A4C: 259.0 ml LV SV MOD A2C:     13.0 ml LV SV MOD A4C:     279.0 ml LV SV MOD BP:      12.8 ml  RIGHT VENTRICLE RV Basal diam:  4.60 cm RV Mid diam:    3.20 cm RV S prime:     8.16 cm/s TAPSE (M-mode): 1.3 cm  LEFT ATRIUM             Index        RIGHT ATRIUM           Index LA diam:        4.40 cm 2.29 cm/m   RA Area:     16.30 cm LA Vol (A2C):   62.5 ml 32.58 ml/m  RA Volume:   45.60 ml  23.77 ml/m LA Vol (A4C):   36.3 ml 18.92 ml/m LA Biplane Vol: 49.7 ml 25.91 ml/m  AORTIC VALVE AV Area (Vmax):    3.38 cm AV Area (Vmean):   3.23 cm AV Area (VTI):     3.71 cm AV Vmax:           61.00 cm/s AV Vmean:          39.800 cm/s AV VTI:            0.071 m AV Peak Grad:      1.5 mmHg AV Mean Grad:      1.0 mmHg LVOT Vmax:         54.30 cm/s LVOT Vmean:        33.800 cm/s LVOT VTI:          0.070 m LVOT/AV VTI ratio: 0.98   AORTA Ao Root diam: 3.30 cm  MITRAL VALVE  TRICUSPID VALVE MV Area (PHT): 6.83 cm    TR Peak grad:   40.4 mmHg MV Decel Time: 111 msec    TR Vmax:        318.00 cm/s MV E velocity: 71.80 cm/s MV A velocity: 43.50 cm/s  SHUNTS MV E/A ratio:  1.65        Systemic VTI:  0.07 m                            Systemic Diam: 2.20 cm  Windell Norfolk Electronically signed by Windell Norfolk Signature Date/Time: 04/09/2023/5:46:27 PM       Final     Component Ref Range & Units (hover) 4 d ago  FVC-Pre 4.10 P   FVC-%Pred-Pre 87 P  FEV1-Pre 3.36 P  FEV1-%Pred-Pre 94 P  FEV6-Pre 4.09 P  FEV6-%Pred-Pre 91 P  Pre FEV1/FVC ratio 82 P  FEV1FVC-%Pred-Pre 107 P  Pre FEV6/FVC Ratio 100 P  FEV6FVC-%Pred-Pre 104 P  FEF 25-75 Pre 3.45 P  FEF2575-%Pred-Pre 115 P  RV 2.32 P  RV % pred 107 P  TLC 6.60 P  TLC % pred 97 P  DLCO unc 24.44 P  DLCO unc % pred 90 P  DLCO cor 23.74 P  DLCO cor % pred 87 P  DL/VA 1.61 P  DL/VA % pred 86 P  Resulting Agency [facility]   Narrative & Impression  CLINICAL DATA:  Shortness of breath for 2 days. Ongoing cough and congestion for 6 weeks. Recent antibiotics.   EXAM: CT ANGIOGRAPHY CHEST WITH CONTRAST   TECHNIQUE: Multidetector CT imaging of the chest was performed using the standard protocol during bolus administration of intravenous contrast. Multiplanar CT image reconstructions and MIPs were obtained to evaluate the vascular anatomy.   RADIATION DOSE REDUCTION: This exam was performed according to the departmental dose-optimization program which includes automated exposure control, adjustment of the mA and/or kV according to patient size and/or use of iterative reconstruction technique.   CONTRAST:  75mL OMNIPAQUE IOHEXOL 350 MG/ML SOLN   COMPARISON:  Radiographs 09/03/2022 and 03/05/2017   FINDINGS: Cardiovascular: The pulmonary arteries are well opacified with contrast to the level of the segmental branches. There is no evidence of acute pulmonary embolism. Atherosclerosis of the aorta, great vessels and coronary arteries with a probable left circumflex coronary artery stent. There is limited opacification of the thoracic aortic lumen. No acute systemic arterial abnormalities are identified. The heart size is normal. There is no pericardial effusion.   Mediastinum/Nodes: There are mildly prominent mediastinal and hilar lymph nodes bilaterally, likely reactive. No enlarged axillary lymph nodes. The thyroid gland, trachea and esophagus demonstrate  no significant findings.   Lungs/Pleura: No pleural effusion or pneumothorax. Central airway thickening without focal airspace disease or suspicious pulmonary nodularity.   Upper abdomen: Mild reflux of contrast into the IVC and hepatic veins. The visualized upper abdomen otherwise appears unremarkable.   Musculoskeletal/Chest wall: There is no chest wall mass or suspicious osseous finding. Mild spondylosis.   Review of the MIP images confirms the above findings.   IMPRESSION: 1. No evidence of acute pulmonary embolism or other acute vascular findings in the chest. 2. Central airway thickening suggesting bronchitis. No focal airspace disease. 3. Mildly prominent mediastinal and hilar lymph nodes, likely reactive. 4. Aortic Atherosclerosis (ICD10-I70.0).     Electronically Signed   By: Carey Bullocks M.D.   On: 02/25/2023 15:49       Assessment/Plan:  This 58 year old gentleman  has what is felt to be primarily ischemic cardiomyopathy presenting with acute on chronic systolic congestive heart failure with an admission ejection fraction of 10 to 15% by echocardiogram and severe left ventricular dilation with LV thrombus and mild RV dysfunction with NYHA class IV symptoms.  His LVEF was 20% by cardiac MRI.  There is no significant AI, MR, or TR.  He is symptomatically improved on milrinone 0.25 with a Co-ox of 60%.  He has poorly controlled diabetes with a hemoglobin A1c of 12.  I think he is going to require advanced therapy going forward and is not a transplant candidate at this time due to recent smoking until January 2025.  I think left ventricular assist device therapy as destination would be his best option at this time.  I think he would be a reasonable candidate for that although his hemoglobin A1c is 12 and he has poorly controlled diabetes.  He has shown some determination to get better in that he quit smoking and hopefully he can start taking better care of his diabetes.  A  major barrier at this time is his lack of insurance and our Child psychotherapist is assisting him with that.  He will be discussed at our medical review board before making any final determination about his candidacy for LVAD therapy.  I discussed the operative procedure with him including long-term expected results and potential risks and benefits and answered all of his questions at this time.   I spent 45 minutes performing this consultation and > 50% of this time was spent face to face counseling and coordinating the care of this patient's acute on chronic systolic heart failure.   Alleen Borne 04/16/2023, 2:37 PM

## 2023-04-16 NOTE — Progress Notes (Signed)
 CSW met with patient to follow up on pending medicaid application. CSW shared that the caseworker Nigel Bridgeman returned call to share that she continues to explore multiple assets and eligibility for medicaid. Patient informed CSW that he also spoke with her and provided additional information that she will look into and return call to patient later today.   CSW discussed need for disability application to the [facility] and completed forms with patient. CSW will forward application this afternoon. CSW also discussed [facility] in the event that medicaid is not approved. CSW also discussed other options of exploring the [employer] Marketplace for future medical coverage as well. Patient verbalizes understanding and grateful for the information and support from the team. CSW will continue to follow and monitor progress with medicaid application. Lasandra Beech, LCSW, CCSW-MCS [phone]

## 2023-04-16 NOTE — Progress Notes (Signed)
 LVAD Initial Psychosocial Screening  Date/Time Initiated:  04/15/2023 12:30 PM Referral Source:  Carlton Adam, VAD Coordinator Referral Reason:  LVAD Implantation Source of Information:  Pt, wife and chart review  Demographics Name:  Kenneth [name] Address: [address] [place], [place] [address] Home phone:  [phone] (home)    Cell: [phone] Marital Status: Married  Faith:  [employer] Primary Language:  [name] DOB: Aug 09, 1965   Psychological Health Appearance:  In hospital gown Mental Status:  Alert, oriented Eye Contact:  Good Thought Content:  Coherent Speech:  Logical/coherent Mood:  Appropriate  Affect:  Appropriate to circumstance Insight:  Fair Judgement: Unimpaired Interaction Style:  Talkative   Family/Social Information Who lives in your home? Name:   Relationship:   Pam   Wife  Other family members/support persons in your life? Name:   Relationship:  Leandra Kern   Son Dorinda Hill   Brother [place]   Sister  Community Are you active with community agencies/resources/homecare? No  Are you active in a church, synagogue, mosque or other faith based community? Yes Faith based institutions name:  [facility] Do you have other sources do you have for spiritual support? Friends and family Are you active in any clubs or social organizations? Scientist, physiological, [place] [employer] What do you do for fun?  Hobbies?  Interests? work   Home Environment/Personal Care Do you have reliable phone service? Yes  If so, what is the number?  [phone] Do you own or rent your home? own Number of steps into the home? 3 How many levels in the home? 2 Electrical needs for LVAD (3 prong outlets)? yes Second hand smoke exposure in the home? no Travel distance from [facility]? 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 No How do you cope with  this? Budgeting Can you budget for the monthly cost for dressing supplies post procedure? Pending   Primary Health insurance:  uninsured Secondary Insurance: none Prescription plan: none Have you ever had to refuse medication due to cost?  Yes Do you use mail order for your prescriptions?  No Have you applied for Medicaid?  Pending Have you applied for Social Security Disability/(SSI)  Will apply through referral sent to [facility]   Education/Work Information What is the last grade of school you completed? 10th grade Do you have any problems with reading or writing?  No Preferred method of learning?  Hands on Are you currently employed?  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 [employer]  How long have you worked there? 30+ years Did you serve in the [employer]? No    Advance Directives: Do you have a Living Will or Medical POA? Yes completed this admission Would you like to complete a Living Will and Medical POA prior to surgery?  Pending Have you had a consult with the Palliative Care Team at [facility]? Yes   Legal Do you currently have any legal issues/problems?  denies Have you had any legal issues/problems in the past?  none  Medical Information Do you have any family history of heart problems? Mom & Dad Do you have a PCP or other medical provider? pending Are you able to complete your ADL's?  yes Do you have any assistive devices in the home? no How are you currently managing your medications at home? pillbox How many hours do  you sleep at night? 8 hrs How is your appetite? good Do you smoke now or past usage? past usage  40 years  Quit date: January 2025 Do you drink alcohol now or past usage? past usage   socially   Are you currently using illegal drugs or misuse of medication or past usage? never  Have you ever been treated for substance abuse? No        Mental Health History How have you been feeling in the  past year? Up and down  PHQ2 Depression Scale: 0  Have you ever had any problems with depression, anxiety or other mental health concerns? none Have you or are you taking medications for anxiety/depression or any mental health concerns?  No  Do you have a history of a traumatic event? none Have you had any past or current thoughts of suicide? none Are there any other current stressors in your life?  none Do you see a counselor, psychiatrist or therapist?  no If you are currently experiencing problems are you interested in talking with a professional? No What are your coping strategies under stressful situations? Dig deeper and harder. No,can't are not in my vocabulary.  Would you be interested in attending the LVAD support group? yes   Medical & Follow-up Do you take your medications as prescribed by the doctors? yes Do you feel as if you have a good understanding of your medications and what they are for? yes If you experience medical concerns or barriers to care in between appointments how do you manage this? Reach out for help No Show Rate Percentage: 16%   Caregiving Needs Who is the primary caregiver? Pam Health status:  good Do you drive?  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 Physical Limitations:  none Do you have other care giving responsibilities?  none Contact number: [phone]  Who is the secondary caregiver? Blake Health status:  good Do you drive?  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  none Contact number: [phone]   Plan for VAD Implementation Do you know and understand what happens during the VAD surgery? Patient Verbalizes Understanding  of surgery and able to describe details What do you know about the risks and side effect associated with VAD surgery? Patient Verbalizes Understanding  of risks (infection, stroke and death) Explain what will happen right after surgery:  Patient Verbalizes Understanding  of OR to ICU and will be intubated  What is your plan for transportation for the first 8 weeks post-surgery? (Patients are not recommended to drive post-surgery for 8 weeks)  Driver: Elita Quick and other family members Do you have airbags in your vehicle?  Yes.There is a risk of discharging the device if the airbag were to deploy.  What do you know about your diet post-surgery? Patient Verbalizes Understanding  of Heart healthy How do you plan to complete ADL's post-surgery?  Ask for help if needed Will it be difficult to ask for help from your caregivers?  no  Please explain what you hope will be improved about your life as a result of receiving the LVAD? "Satisfactional living-second lease on life" Please tell me your biggest concern or fear about living with the LVAD?  Nothing really Please explain your understanding of how your body will change. Are you worried about these changes? Not at all Do you see any barriers to your surgery or follow-up? no  Understanding of  LVAD Patient states understanding of the following: Surgical procedures and risks, Electrical need for LVAD (3 prong outlets), Safety precautions with LVAD (water, etc.), LVAD daily self-care (dressing changes, computer check, extra supplies), Outpatient follow up (LVAD clinic appts, monitoring blood thinners), and Need for Emergency Planning  Discussed and Reviewed with Patient and Caregiver  Patient's current level of motivation to prepare for LVAD: Highly motivated Patient's present Level of Consent for LVAD: ready    Education provided to patient/family/caregiver:   Caregiver role and responsibiltiy, Financial planning for LVAD, Role of Clinical Social Worker, and Signs of Depression and Anxiety    Discussed and Reviewed with Patient and Caregiver  Caregiver questions Please explain what you hope will be improved about your life and loved one's life as a result of receiving the LVAD?  "Live  a long happy life with me" What is your biggest concern or fear about caregiving with an LVAD patient?  "He could be a difficult patient" What is your plan for availability to provide care 24/7 x2 weeks post op and dressing changes ongoing?  Wife is available for the 24/7 needs Who is the relief/backup caregiver and what is their availability?  Blake Preferred method of learning? Hands on  Do you drive? yes How do you handle stressful situations?  Deal with it Do you think you can do this? yes Is there anything that concerns about caregiving?  no Do you provide caregiving to anyone else?  no  Caregiver's current level of motivation to prepare for LVAD: highly motivated Caregiver's present level of consent for LVAD: Ready  Clinical Interventions Needed:    CSW will monitor signs and symptoms of depression and assist with adjustment to life with an LVAD.  CSW will assist with navigating the insurance and disability process as patient is uninsured and no income. CSW will refer patient for HPOA,  if not completed prior to surgery if still wishing to complete.  CSW encouraged attendance with the LVAD Support Group to assist further with adjustment and post implant peer support.  Clinical Impressions/Recommendations:   Mr. Edmon "Ray" Marling is a 58 yo married male with two adult sons. He has multiple family members who are supportive with his sister Cordelia Pen very involved and assisting with the insurance process. His wife Elita Quick will be the primary caregiver and his son Harrison Mons with assist as back up. 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 His wife draws Tree surgeon retirement, pension and works a part time job. Patient denies any financial concerns although is currently uninsured. He has a 10 th  grade education and denies any difficulties with reading or writing. 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 He states that smoked tobacco for 40+ years and quit this past January 2025. He denies any recent alcohol use and states he only had an occasional drink in the past. He also denies any illegal substance use. He reports he has been feeling "up and down" over the past year but denies any mental health history. He scored a 0 on the PHQ-9. He denies any other stressors in his life besides his current health. He states that he "digs deeper and harder during times of stress". His goal for the VAD is "satisfactional living" and  getting a "second lease on life". He denies any concerns or fears or issues with body image with LVAD implantation.   Patient appears to be a good candidate although lack of insurance is a barrier to implantation.  CSW discussed at length the barrier of being uninsured for pursuit of the LVAD. Patient and family members will work closely with the Medicaid office and if denied possibly proceed with exploring eligibility with the [employer] for insurance. CSW will continue to follow. Lasandra Beech, LCSW, CCSW-MCS [phone]

## 2023-04-16 NOTE — Progress Notes (Signed)
 At bedside for PICC evaluation. Pt c/o discomfort at site. No edema, redness or drainage noted. Site clean, dry and intact. Warm pack wrapped in a cloth and applied to site. RN aware and to reassess. To Reconsult if needed.

## 2023-04-16 NOTE — Progress Notes (Signed)
 Nutrition Follow-up  DOCUMENTATION CODES:  Non-severe (moderate) malnutrition in context of chronic illness  INTERVENTION:  Multivitamin w/ minerals daily Discussed importance of nutrition prior to and status post LVAD placement Double protein with all meals w/ manager check  Liberalize to CHO modified only w/ fluid restriction Add QHS snack of [place] sandwich on whole wheat  NUTRITION DIAGNOSIS:  Moderate Malnutrition related to chronic illness as evidenced by mild fat depletion, mild muscle depletion, moderate muscle depletion. - malnutrition dx established  GOAL:  Patient will meet greater than or equal to 90% of their needs - progressing  MONITOR:  PO intake, Labs, Weight trends, Supplement acceptance  REASON FOR ASSESSMENT:  Consult Assessment of nutrition requirement/status  ASSESSMENT:   58 y.o. male presented to the ED with L sided chest pain. PMH includes T2DM, tobacco use, NSTEMI, CVA, HTN, HLD, and CAD. Pt admitted with acute on chronic CHF.  3/18 - Ad[facility]  3/21 - transferred to [facility] for LVAD evaluation  Spoke with patient and his wife at bedside to complete nutrition-focused physical exam as well as to discuss nutrition care plan leading up to LVAD placement. Main barrier to placement currently, per cardiology, is blood sugar control and lack of insurance coverage.   Average Meal Intake 3/23: 75-100% x2 documented meals 3/24: 100% x1 documented meal  Appetite is excellent. Double portion proteins ordered, however patient still expressing dislike for portion sizes and that he cannot order an extra sandwich to come up with his supper to be consumed throughout the evening and overnight to bridge the timeframe until breakfast. Will add bedtime snack of turkey/chicken salad sandwich. Pt appreciative. Also discussed adding manager check to meal trays to ensure double proteins being sent.   He is noted with plethora of snack options at bedside including: [employer], Nutrigrain bars, and trail mix, among other options. States he will graze throughout the day between meals. He is amenable to this set up currently. Will liberalize diet to carb modified only to promote nutrition optimization prior to surgery.  Discussed what to expect before and after LVAD placement. Discussed typical interventions used to meet nutrition needs s/p surgery. He and his wife verbalize understanding. States he consumes Boost at home sometimes and has not problem accepting these, if indicated, after surgery. Prefers Administrator.  Admit Weight: 72.9kg Current Weight: 70.8kg  Extensive weight hx collected. Pt originally 220lbs in January 2019, at which point he had a stroke and lost his father. Reports losing down to 180lbs by March of that year. UBW endorsed as 175-180lbs since that time until recently. Most recent weight collected today 160lbs. Per chart review, he has ranged from 154-170lbs since August 2024. Has shown 8% wt loss since that time, which is not considered clinically significant for the time frame. UOP desirable with almost 3L UOP yesterday.   Intake/Output Summary (Last 24 hours) at 04/16/2023 1445 Last data filed at 04/16/2023 1318 Gross per 24 hour  Intake --  Output 2800 ml  Net -2800 ml    Net IO Since Admission: -5,820.77 mL [04/16/23 1445]   Remains on significant insulin regimen as this is a barrier to surgery. Discussed importance of protein intake as it relates to surgery prep and blood sugar stabilization. Pt motivated and amicable to plan of care. Carb modified diet remains appropriate.   Meds: SSI 0-5 at bedtime, SSI 0-9 Q4 hours, SSI 10 TID, semglee, melatonin, MVI, pantoprazole, KCl x1, spironolactone, torsemide  Labs:  Na+ 133>136 (wdl) K+  4.1>3.9 (wdl) CBGs 138-394 x48 hours A1c 12.0 (03/2023)  NUTRITION - FOCUSED PHYSICAL EXAM:  Flowsheet Row Most Recent Value  Orbital Region Mild depletion  Upper Arm Region Mild depletion   Thoracic and Lumbar Region Moderate depletion  Buccal Region Mild depletion  Temple Region Moderate depletion  Clavicle Bone Region Mild depletion  Clavicle and Acromion Bone Region Mild depletion  Scapular Bone Region Moderate depletion  Dorsal Hand Mild depletion  Patellar Region Mild depletion  Anterior Thigh Region Mild depletion  Posterior Calf Region Moderate depletion  Edema (RD Assessment) None  Hair Reviewed  Eyes Reviewed  Mouth Reviewed  Skin Reviewed  Nails Reviewed     Diet Order:   Diet Order             Diet heart healthy/carb modified Room service appropriate? Yes; Fluid consistency: Thin; Fluid restriction: 1500 mL Fluid  Diet effective now            EDUCATION NEEDS:  Education needs have been addressed  Skin:  Skin Assessment: Reviewed RN Assessment  Last BM:  3/22 - monitor the need for more formalized bowel regimen  Height:  Ht Readings from Last 1 Encounters:  04/12/23 5\' 9"  (1.753 m)   Weight:  Wt Readings from Last 1 Encounters:  04/16/23 70.8 kg    Ideal Body Weight:  72.7 kg  BMI:  Body mass index is 23.04 kg/m.  Estimated Nutritional Needs:   Kcal:  2100-2300  Protein:  105-125 grams  Fluid:  >/= 1.5 L  Myrtie Cruise MS, RD, LDN Registered Dietitian Clinical Nutrition RD Inpatient Contact Info in Amion

## 2023-04-16 NOTE — Progress Notes (Signed)
 Mobility Specialist Progress Note;   04/16/23 1010  Mobility  Activity Ambulated with assistance in hallway  Level of Assistance Modified independent, requires aide device or extra time  Assistive Device Other (Comment) (IV pole)  Distance Ambulated (ft) 1000 ft  Activity Response Tolerated well  Mobility Referral Yes  Mobility visit 1 Mobility  Mobility Specialist Start Time (ACUTE ONLY) 1010  Mobility Specialist Stop Time (ACUTE ONLY) 1030  Mobility Specialist Time Calculation (min) (ACUTE ONLY) 20 min   Pt eager for mobility; requesting to go outside per new orders. Required no physical assistance during ambulation, ModI. Ambulated outside w/o fault. Returned pt safely back to room with all needs met.   Caesar Bookman Mobility Specialist Please contact via SecureChat or [employer] [phone]

## 2023-04-16 NOTE — Progress Notes (Signed)
 PHARMACY - ANTICOAGULATION CONSULT NOTE  Pharmacy Consult for heparin infusion Indication: Large LV thrombus   No Known Allergies  Patient Measurements: Height: 5\' 9"  (175.3 cm) Weight: 70.8 kg (156 lb) IBW/kg (Calculated) : 70.7 Heparin Dosing Weight: 76.2 kg  Vital Signs: Temp: 98.5 F (36.9 C) (03/25 0512) Temp Source: Oral (03/25 0512) BP: 120/82 (03/25 0512) Pulse Rate: 86 (03/25 0512)  Labs: Recent Labs    04/14/23 0450 04/15/23 0550 04/15/23 1430 04/16/23 0520  HGB 16.4 15.4  --  15.7  HCT 49.8 46.6  --  47.7  PLT 304 306  --  328  HEPARINUNFRC 0.47 0.65  --  0.83*  CREATININE 1.27* 1.33* 1.08 1.04   Estimated Creatinine Clearance: 77.4 mL/min (by C-G formula based on SCr of 1.04 mg/dL).  Medical History: Past Medical History:  Diagnosis Date   Coronary artery disease    Diabetes mellitus without complication (HCC)    GERD (gastroesophageal reflux disease)    Hypertension    Stroke (cerebrum) (HCC)     Medications:  Scheduled:   amiodarone  200 mg Oral BID   aspirin EC  81 mg Oral Daily   atorvastatin  80 mg Oral Daily   busPIRone  5 mg Oral BID   Chlorhexidine Gluconate Cloth  6 each Topical Daily   digoxin  0.125 mg Oral Daily   insulin aspart  0-5 Units Subcutaneous QHS   insulin aspart  0-9 Units Subcutaneous Q4H   insulin aspart  7 Units Subcutaneous TID WC   insulin glargine-yfgn  16 Units Subcutaneous Daily   losartan  12.5 mg Oral Daily   melatonin  2.5 mg Oral QHS   multivitamin with minerals  1 tablet Oral Daily   mupirocin ointment  1 Application Nasal BID   pantoprazole  40 mg Oral Daily   sodium chloride flush  10-40 mL Intracatheter Q12H   sodium chloride flush  3 mL Intravenous Q12H   spironolactone  12.5 mg Oral Daily   torsemide  20 mg Oral Daily   Assessment: 58 y.o. male with CAD s/p PCI in 08/2022, hypertension, GERD, type 2 diabetes, hyperlipidemia, and tobacco use who presented with worsening shortness of breath over  several months. Large LV thrombus noted on ECHO. No chronic anticoagulation noted based on dispense record. Pt does not have insurance.   3/25 AM update:  Heparin level supra-therapeutic  Plan:  Dec heparin to 1500 units/hr Heparin level in 8 hours  Abran Duke, PharmD, BCPS Clinical Pharmacist Phone: [phone]

## 2023-04-16 NOTE — Progress Notes (Signed)
 PHARMACY - ANTICOAGULATION CONSULT NOTE  Pharmacy Consult for heparin infusion Indication: Large LV thrombus   No Known Allergies  Patient Measurements: Height: 5\' 9"  (175.3 cm) Weight: 70.8 kg (156 lb) IBW/kg (Calculated) : 70.7 Heparin Dosing Weight: 76.2 kg  Vital Signs: Temp: 98.6 F (37 C) (03/25 1314) Temp Source: Oral (03/25 1314) BP: 113/80 (03/25 1314) Pulse Rate: 91 (03/25 1314)  Labs: Recent Labs    04/14/23 0450 04/15/23 0550 04/15/23 1430 04/16/23 0520 04/16/23 1343  HGB 16.4 15.4  --  15.7  --   HCT 49.8 46.6  --  47.7  --   PLT 304 306  --  328  --   HEPARINUNFRC 0.47 0.65  --  0.83* 0.58  CREATININE 1.27* 1.33* 1.08 1.04  --    Estimated Creatinine Clearance: 77.4 mL/min (by C-G formula based on SCr of 1.04 mg/dL).  Medical History: Past Medical History:  Diagnosis Date   Coronary artery disease    Diabetes mellitus without complication (HCC)    GERD (gastroesophageal reflux disease)    Hypertension    Stroke (cerebrum) (HCC)     Medications:  Scheduled:   amiodarone  200 mg Oral BID   aspirin EC  81 mg Oral Daily   atorvastatin  80 mg Oral Daily   busPIRone  5 mg Oral BID   Chlorhexidine Gluconate Cloth  6 each Topical Daily   digoxin  0.125 mg Oral Daily   insulin aspart  0-5 Units Subcutaneous QHS   insulin aspart  0-9 Units Subcutaneous Q4H   insulin aspart  10 Units Subcutaneous TID WC   [START ON 04/17/2023] insulin glargine-yfgn  20 Units Subcutaneous QHS   melatonin  2.5 mg Oral QHS   multivitamin with minerals  1 tablet Oral Daily   mupirocin ointment  1 Application Nasal BID   pantoprazole  40 mg Oral Daily   sacubitril-valsartan  1 tablet Oral BID   sodium chloride flush  10-40 mL Intracatheter Q12H   sodium chloride flush  3 mL Intravenous Q12H   spironolactone  25 mg Oral Daily   torsemide  20 mg Oral Daily   Assessment: 58 y.o. male with CAD s/p PCI in 08/2022, hypertension, GERD, type 2 diabetes, hyperlipidemia, and  tobacco use who presented with worsening shortness of breath over several months. Large LV thrombus noted on ECHO. No chronic anticoagulation noted based on dispense record. Pt does not have insurance.   Heparin level 0.58, therapeutic after rate reduction to 1500 units/hr.  Drawn from PICC- confirmed with RN that heparin was paused x47min, line flushed and then drawn.  No overt bleeding or complications noted.  Goal of Therapy:  Heparin level 0.3-0.7 units/ml Monitor platelets by anticoagulation protocol: Yes  Plan:  Continue IV heparin 1500 units/hr. Daily heparin level and CBC.  Trixie Rude, PharmD Clinical Pharmacist 04/16/2023  4:01 PM

## 2023-04-16 NOTE — Progress Notes (Signed)
 Advanced Heart Failure Rounding Note   Subjective:     On milrinone 0.25. Co-ox 60%  Still feels great. Discussed plan of care and discussion  Objective:   Echo LVEF 10-15%, severely dilated LV (6.7 LVIDD) w/ large LV thrombus. RV mildly reduced.    LHC 8/24:  LM ok LAD minor irregs LCx 100% large OM1 -> PCI  RCA CTO prox RHC 04/11/23 RA 8 PA 53/26 (36) PW 26 PVR 3.1 Fick 3.2/1.7 PAPi 2.1  Weight Range:  Vital Signs:   Temp:  [98.5 F (36.9 C)-98.7 F (37.1 C)] 98.5 F (36.9 C) (03/25 0512) Pulse Rate:  [84-92] 86 (03/25 0512) Resp:  [18-19] 18 (03/24 2324) BP: (120-137)/(82-90) 120/82 (03/25 0512) SpO2:  [95 %-98 %] 95 % (03/25 0512) Weight:  [70.8 kg] 70.8 kg (03/25 0500) Last BM Date : 04/16/23  Weight change: Filed Weights   04/14/23 0422 04/15/23 0546 04/16/23 0500  Weight: 67.6 kg 68.9 kg 70.8 kg    Intake/Output:   Intake/Output Summary (Last 24 hours) at 04/16/2023 1021 Last data filed at 04/16/2023 0535 Gross per 24 hour  Intake 325.9 ml  Output 2200 ml  Net -1874.1 ml    CVP unhooked Physical Exam: General:  well appearing.  No respiratory difficulty Neck: supple. JVD flat.  Cor: PMI nondisplaced. Regular rate & rhythm. No rubs, gallops or murmurs. Lungs: clear Abdomen: soft, nontender, nondistended. Good bowel sounds. Extremities: no cyanosis, clubbing, rash, edema. RUE PICC Neuro: alert & oriented x 3. Moves all 4 extremities w/o difficulty. Affect pleasant.   Telemetry: NSR 90s with intt PVCs  (Personally reviewed)     Labs: Basic Metabolic Panel: Recent Labs  Lab 04/13/23 0500 04/14/23 0450 04/15/23 0550 04/15/23 1430 04/16/23 0520  NA 139 134* 135 133* 136  K 3.9 4.0 4.8 4.1 3.9  CL 102 97* 97* 96* 104  CO2 26 27 27 26 25   GLUCOSE 109* 323* 394* 138* 249*  BUN 20 25* 27* 22* 20  CREATININE 1.25* 1.27* 1.33* 1.08 1.04  CALCIUM 8.9 9.1 8.9 9.3 8.6*  MG 1.8 2.0 1.8 2.2 1.8    Liver Function Tests: No results for input(s):  "AST", "ALT", "ALKPHOS", "BILITOT", "PROT", "ALBUMIN" in the last 168 hours.  No results for input(s): "LIPASE", "AMYLASE" in the last 168 hours. No results for input(s): "AMMONIA" in the last 168 hours.  CBC: Recent Labs  Lab 04/12/23 0537 04/13/23 0500 04/14/23 0450 04/15/23 0550 04/16/23 0520  WBC 7.9 9.1 8.8 8.3 9.2  HGB 14.6 14.7 16.4 15.4 15.7  HCT 43.8 44.5 49.8 46.6 47.7  MCV 87.4 88.1 88.5 88.6 88.2  PLT 217 265 304 306 328    Cardiac Enzymes: No results for input(s): "CKTOTAL", "CKMB", "CKMBINDEX", "TROPONINI" in the last 168 hours.  BNP: BNP (last 3 results) Recent Labs    02/18/23 1348 04/09/23 0121  BNP 697.3* 1,185.6*   ProBNP (last 3 results) No results for input(s): "PROBNP" in the last 8760 hours.  Other results:  Medications:   Scheduled Medications:  amiodarone  200 mg Oral BID   aspirin EC  81 mg Oral Daily   atorvastatin  80 mg Oral Daily   busPIRone  5 mg Oral BID   Chlorhexidine Gluconate Cloth  6 each Topical Daily   digoxin  0.125 mg Oral Daily   insulin aspart  0-5 Units Subcutaneous QHS   insulin aspart  0-9 Units Subcutaneous Q4H   insulin aspart  10 Units Subcutaneous TID WC   [  START ON 04/17/2023] insulin glargine-yfgn  20 Units Subcutaneous QHS   melatonin  2.5 mg Oral QHS   multivitamin with minerals  1 tablet Oral Daily   mupirocin ointment  1 Application Nasal BID   pantoprazole  40 mg Oral Daily   sacubitril-valsartan  1 tablet Oral BID   sodium chloride flush  10-40 mL Intracatheter Q12H   sodium chloride flush  3 mL Intravenous Q12H   spironolactone  25 mg Oral Daily   torsemide  20 mg Oral Daily    Infusions:  heparin 1,500 Units/hr (04/16/23 0629)   milrinone 0.25 mcg/kg/min (04/16/23 0019)    PRN Medications: sodium chloride flush, sodium chloride flush   Kenneth [name] is a 58 y.o. male with a PMH of CAD with PCI to the RCA and OM, chronic heart failure with reduced ejection fraction, diabetes, hyperlipidemia,  hypertension, prior CVA, tobacco use who was admitted to [facility] w/ acute on chronic systolic heart failure w/ NYHA IV symptoms.  Has had progressive decline in functional status/exercise tolerance over the last year.    Assessment/Plan:  1. Acute on chronic systolic CHF: ?Primarily ischemic cardiomyopathy.  Echo in 8/24 at time of last NSTEMI with EF 25-30%.  Echo this admission with EF 10-15%, severe LV dilation with LV thrombus, mild RV dysfunction.  His cardiomyopathy does seem out of proportion to his coronary disease.  He does not drink ETOH or use drugs.  No family history of dilated cardiomyopathy (FH of CAD).  He does have uncontrolled DM as well as a chronic wide LBBB (176 msec) which could contribute. Also noted to have frequent PVCs.  PCWP elevated on RHC, CI low at 1.67.   - Currently on Milrinone 0.25 mcg/kg/min Co-ox 60%. Estimated Fick 1.7. Feeling well  - Volume status stable. Continue Torsemide 20 mg daily - Continue digoxin 0.125 daily.  - Transition from losartan to entresto 24/26mg  BID - Increase spironolactone to 25 daily.  - Hold Toprol XL for now with low output.  - Now undergoing LVAD work up.  He has low output HF with quite low EF.  Not a transplant candidate at this time with active smoking prior to admission.  Barriers for LVAD will be poor DM control (hgbA1c 12) and lack of insurance. - I suspect his LBBB is playing a part, but given his advanced symptoms, LV dilation, and lack of insurance I do not believe that CRT-D will meaningfully improve his clinical trajectory - PFTs today  2. LV thrombus: continue heparin  3. PVCs/NSVT: Patient noted to have frequent PVCs on telemetry.   - With addition of milrinone, now on amiodarone 200 mg po bid.  - NSVT stable  4. CAD: Remote PCI to RCA, cath in 8/24 showed chronically occluded proximal RCA.  NSTEMI in 8/24 with occluded OM1 treated with DES.  LAD with minimal disease.  No chest pain currently.  HS-TnI not significantly  elevated, no ACS this admission.  - He is now off Plavix (> 6 months post PCI).  - Continue ASA 81.  - Continue statin.   5. DM2: Poor control, hgbA1c 12.   - Diabetes coordinator consult. - On standing insulin now + SSI.   6. Smoking: He has quit  7. Will need social worker to help [place] with getting insurance set up.   Order in for nursing staff, ok for patient to get some fresh air.    Length of Stay: 4   Romie Minus AGACNP-BC  04/16/2023, 10:21 AM  Advanced Heart Failure Team Pager [phone] (M-F; 7a - 4p)  Please contact [facility] for night-coverage after hours (4p -7a ) and weekends on [email or web address]

## 2023-04-17 LAB — COOXEMETRY PANEL
Carboxyhemoglobin: 1 % (ref 0.5–1.5)
Methemoglobin: 0.8 % (ref 0.0–1.5)
O2 Saturation: 61.1 %
Total hemoglobin: 17 g/dL — ABNORMAL HIGH (ref 12.0–16.0)

## 2023-04-17 LAB — MAGNESIUM: Magnesium: 1.7 mg/dL (ref 1.7–2.4)

## 2023-04-17 LAB — BASIC METABOLIC PANEL
Anion gap: 8 (ref 5–15)
BUN: 30 mg/dL — ABNORMAL HIGH (ref 6–20)
CO2: 26 mmol/L (ref 22–32)
Calcium: 8.7 mg/dL — ABNORMAL LOW (ref 8.9–10.3)
Chloride: 99 mmol/L (ref 98–111)
Creatinine, Ser: 1.16 mg/dL (ref 0.61–1.24)
GFR, Estimated: >60 mL/min (ref >60)
Glucose, Bld: 313 mg/dL — ABNORMAL HIGH (ref 70–99)
Potassium: 4 mmol/L (ref 3.5–5.1)
Sodium: 133 mmol/L — ABNORMAL LOW (ref 135–145)

## 2023-04-17 LAB — FACTOR 5 LEIDEN

## 2023-04-17 LAB — CBC
HCT: 50.5 % (ref 39.0–52.0)
Hemoglobin: 16.4 g/dL (ref 13.0–17.0)
MCH: 28.5 pg (ref 26.0–34.0)
MCHC: 32.5 g/dL (ref 30.0–36.0)
MCV: 87.7 fL (ref 80.0–100.0)
Platelets: 347 10*3/uL (ref 150–400)
RBC: 5.76 MIL/uL (ref 4.22–5.81)
RDW: 15 % (ref 11.5–15.5)
WBC: 9 10*3/uL (ref 4.0–10.5)
nRBC: 0 % (ref 0.0–0.2)

## 2023-04-17 LAB — GLUCOSE, CAPILLARY
Glucose-Capillary: 324 mg/dL — ABNORMAL HIGH (ref 70–99)
Glucose-Capillary: 132 mg/dL — ABNORMAL HIGH (ref 70–99)
Glucose-Capillary: 150 mg/dL — ABNORMAL HIGH (ref 70–99)
Glucose-Capillary: 127 mg/dL — ABNORMAL HIGH (ref 70–99)

## 2023-04-17 LAB — HEPARIN LEVEL (UNFRACTIONATED)
Heparin Unfractionated: 0.87 [IU]/mL — ABNORMAL HIGH (ref 0.30–0.70)
Heparin Unfractionated: 0.58 [IU]/mL (ref 0.30–0.70)
Heparin Unfractionated: 0.53 [IU]/mL (ref 0.30–0.70)

## 2023-04-17 MED ORDER — TORSEMIDE 20 MG PO TABS: 20.0000 mg | ORAL_TABLET | Freq: Every day | ORAL | Status: DC

## 2023-04-17 MED ORDER — MAGNESIUM SULFATE 4 GM/100ML IV SOLN
4.0000 g | Freq: Once | INTRAVENOUS | Status: AC
  Administered 2023-04-17: 4 g via INTRAVENOUS
  Filled 2023-04-17: qty 100

## 2023-04-17 MED ORDER — INSULIN GLARGINE-YFGN 100 UNIT/ML ~~LOC~~ SOLN
25.0000 [IU] | Freq: Every day | SUBCUTANEOUS | Status: DC
  Administered 2023-04-17: 25 [IU] via SUBCUTANEOUS
  Filled 2023-04-17 (×2): qty 0.25

## 2023-04-17 MED ORDER — INSULIN ASPART 100 UNIT/ML IJ SOLN
11.0000 [IU] | Freq: Three times a day (TID) | INTRAMUSCULAR | Status: DC
  Administered 2023-04-17 – 2023-04-20 (×9): 11 [IU] via SUBCUTANEOUS

## 2023-04-17 NOTE — Progress Notes (Signed)
 PHARMACY - ANTICOAGULATION Pharmacy Consult for heparin Indication: LV thrombus  Brief A/P: Heparin level supratherapeutic Decrease Heparin rate  No Known Allergies  Patient Measurements: Height: 5\' 9"  (175.3 cm) Weight: 70.7 kg (155 lb 13.8 oz) IBW/kg (Calculated) : 70.7 Heparin Dosing Weight: 76.2 kg  Vital Signs: Temp: 98.2 F (36.8 C) (03/26 0424) Temp Source: Oral (03/26 0424) BP: 99/74 (03/26 0424) Pulse Rate: 85 (03/26 0424)  Labs: Recent Labs    04/15/23 0550 04/15/23 1430 04/16/23 0520 04/16/23 1343 04/17/23 0435  HGB 15.4  --  15.7  --  16.4  HCT 46.6  --  47.7  --  50.5  PLT 306  --  328  --  347  HEPARINUNFRC 0.65  --  0.83* 0.58 0.87*  CREATININE 1.33* 1.08 1.04  --  1.16   Estimated Creatinine Clearance: 69.4 mL/min (by C-G formula based on SCr of 1.16 mg/dL).  Assessment:  58 y.o. male with CHF and LV thrombus for heparin  Goal of Therapy:  Heparin level 0.3-0.7 units/ml Monitor platelets by anticoagulation protocol: Yes  Plan:  Decrease Heparin 1300 units/hr Check heparin level in 8 hours.  Geannie Risen, PharmD, BCPS  04/17/2023  5:59 AM

## 2023-04-17 NOTE — Progress Notes (Signed)
   Palliative Medicine Inpatient Follow Up Note HPI: Kenneth [name] is a 58 y.o. male with a PMH of CAD with PCI to the RCA and OM, chronic heart failure with reduced ejection fraction, diabetes, hyperlipidemia, hypertension, prior CVA, tobacco use who was admitted to [facility] w/ acute on chronic systolic heart failure w/ NYHA IV symptoms. Has had progressive decline in functional status/exercise tolerance over the last year. The PMT has been asked to support LVAD evaluation.   Today's Discussion 04/17/2023  *Please note that this is a verbal dictation therefore any spelling or grammatical errors are due to the "[employer]agon [employer] One" system interpretation.  Chart reviewed inclusive of vital signs, progress notes, laboratory results, and diagnostic images.   I met with [name] at bedside this morning. He was joined by his long time best friend. Cameo shares with me that he feels upset in that he was told if he neglects to get medicaid then he will not be able to get the VAD. He shares that if that is the reality he would prefer not to be hospitalized if his life expectancy is limited to 3-4 weeks.  We discussed patients feelings in greater detail.  I shared understanding under the given circumstances.   I reviewed with [name] that from my experience the VAD team will do all that they can to get patients who are good candidates this procedure. We reviewed that [name] would be back tomorrow to talk further though in the meanwhile I will alert [name]'s team to how he is feeling.   Questions and concerns addressed/Palliative Support Provided.   Objective Assessment: Vital Signs Vitals:   04/17/23 0424 04/17/23 1022  BP: 99/74 (!) 88/72  Pulse: 85   Resp: 16   Temp: 98.2 F (36.8 C) 98.5 F (36.9 C)  SpO2: 96% 92%    Intake/Output Summary (Last 24 hours) at 04/17/2023 1503 Last data filed at 04/17/2023 0725 Gross per 24 hour  Intake --  Output 1600 ml  Net -1600 ml   Last Weight  Most recent  update: 04/17/2023  4:25 AM    Weight  70.7 kg (155 lb 13.8 oz)            Gen: Middle aged Caucasian M in NAD HEENT: moist mucous membranes CV: Regular rate and rhythm  PULM:  On RA, breathing is even and nonlabored ABD: soft/nontender  EXT: No edema  Neuro: Alert and oriented x3    SUMMARY OF RECOMMENDATIONS   Full Code / Full Scope of Care  Patient is interested in VAD - he is willing to work with MS[employer] for insurance. [name] will meet with [name] tomorrow to  further discuss options   Ongoing support  Billing based on MDM: High - Advanced care planning, care escalation/de-escalation ______________________________________________________________________________________ [name] Palliative Medicine Team Team Cell Phone: [phone] Please utilize secure chat with additional questions, if there is no response within 30 minutes please call the above phone number  Palliative Medicine Team providers are available by phone from 7am to 7pm daily and can be reached through the team cell phone.  Should this patient require assistance outside of these hours, please call the patient's attending physician.

## 2023-04-17 NOTE — Progress Notes (Signed)
 Advanced Heart Failure Rounding Note  Subjective:    On milrinone 0.25. Co-ox 61%  SBP soft but denies dizziness/lightheadedness.   Feels ok, slightly discouraged today as he is still uninsured.   Objective:   Echo LVEF 10-15%, severely dilated LV (6.7 LVIDD) w/ large LV thrombus. RV mildly reduced.   LHC 8/24:  LM ok LAD minor irregs LCx 100% large OM1 -> PCI  RCA CTO prox RHC 04/11/23 RA 8 PA 53/26 (36) PW 26 PVR 3.1 Fick 3.2/1.7 PAPi 2.1  Weight Range:  Vital Signs:   Temp:  [98.2 F (36.8 C)-98.8 F (37.1 C)] 98.2 F (36.8 C) (03/26 0424) Pulse Rate:  [85-106] 85 (03/26 0424) Resp:  [16-18] 16 (03/26 0424) BP: (99-126)/(64-90) 99/74 (03/26 0424) SpO2:  [93 %-96 %] 96 % (03/26 0424) Weight:  [70.7 kg] 70.7 kg (03/26 0424) Last BM Date : 04/16/23  Weight change: Filed Weights   04/15/23 0546 04/16/23 0500 04/17/23 0424  Weight: 68.9 kg 70.8 kg 70.7 kg   Intake/Output:   Intake/Output Summary (Last 24 hours) at 04/17/2023 6213 Last data filed at 04/17/2023 0725 Gross per 24 hour  Intake --  Output 2200 ml  Net -2200 ml    CVP 0-2 Physical Exam: General:  well appearing.  No respiratory difficulty HEENT: normal Neck: supple. JVD flat.  Cor: PMI nondisplaced. Regular rate & rhythm. No rubs, gallops or murmurs. Lungs: clear Extremities: no cyanosis, clubbing, rash, edema. PICC RUE Neuro: alert & oriented x 3. Moves all 4 extremities w/o difficulty. Affect pleasant.   Telemetry: NSR 80s. Brief bigeminy this morning. 5 beat NSVT  (Personally reviewed)     Labs: Basic Metabolic Panel: Recent Labs  Lab 04/14/23 0450 04/15/23 0550 04/15/23 1430 04/16/23 0520 04/17/23 0435  NA 134* 135 133* 136 133*  K 4.0 4.8 4.1 3.9 4.0  CL 97* 97* 96* 104 99  CO2 27 27 26 25 26   GLUCOSE 323* 394* 138* 249* 313*  BUN 25* 27* 22* 20 30*  CREATININE 1.27* 1.33* 1.08 1.04 1.16  CALCIUM 9.1 8.9 9.3 8.6* 8.7*  MG 2.0 1.8 2.2 1.8 1.7    Liver Function Tests: No  results for input(s): "AST", "ALT", "ALKPHOS", "BILITOT", "PROT", "ALBUMIN" in the last 168 hours.  No results for input(s): "LIPASE", "AMYLASE" in the last 168 hours. No results for input(s): "AMMONIA" in the last 168 hours.  CBC: Recent Labs  Lab 04/13/23 0500 04/14/23 0450 04/15/23 0550 04/16/23 0520 04/17/23 0435  WBC 9.1 8.8 8.3 9.2 9.0  HGB 14.7 16.4 15.4 15.7 16.4  HCT 44.5 49.8 46.6 47.7 50.5  MCV 88.1 88.5 88.6 88.2 87.7  PLT 265 304 306 328 347    Cardiac Enzymes: No results for input(s): "CKTOTAL", "CKMB", "CKMBINDEX", "TROPONINI" in the last 168 hours.  BNP: BNP (last 3 results) Recent Labs    02/18/23 1348 04/09/23 0121  BNP 697.3* 1,185.6*   ProBNP (last 3 results) No results for input(s): "PROBNP" in the last 8760 hours.  Other results:  Medications:   Scheduled Medications:  amiodarone  200 mg Oral BID   aspirin EC  81 mg Oral Daily   atorvastatin  80 mg Oral Daily   busPIRone  5 mg Oral BID   Chlorhexidine Gluconate Cloth  6 each Topical Daily   digoxin  0.125 mg Oral Daily   insulin aspart  0-5 Units Subcutaneous QHS   insulin aspart  0-9 Units Subcutaneous TID WC   insulin aspart  11 Units Subcutaneous TID  WC   insulin glargine-yfgn  25 Units Subcutaneous QHS   melatonin  2.5 mg Oral QHS   multivitamin with minerals  1 tablet Oral Daily   mupirocin ointment  1 Application Nasal BID   pantoprazole  40 mg Oral Daily   sacubitril-valsartan  1 tablet Oral BID   sodium chloride flush  10-40 mL Intracatheter Q12H   sodium chloride flush  3 mL Intravenous Q12H   spironolactone  25 mg Oral Daily   torsemide  20 mg Oral Daily    Infusions:  heparin 1,300 Units/hr (04/17/23 0624)   magnesium sulfate bolus IVPB     milrinone 0.25 mcg/kg/min (04/16/23 1845)    PRN Medications: sodium chloride flush, sodium chloride flush  Kenneth [name] is a 58 y.o. male with a PMH of CAD with PCI to the RCA and OM, chronic heart failure with reduced ejection  fraction, diabetes, hyperlipidemia, hypertension, prior CVA, tobacco use who was admitted to [facility] w/ acute on chronic systolic heart failure w/ NYHA IV symptoms.  Has had progressive decline in functional status/exercise tolerance over the last year.    Assessment/Plan:  1. Acute on chronic systolic CHF: ?Primarily ischemic cardiomyopathy.  Echo 8/24 at time of last NSTEMI with EF 25-30%.  Echo this admission with EF 10-15%, severe LV dilation with LV thrombus, mild RV dysfunction.  His cardiomyopathy does seem out of proportion to his coronary disease.  He does not drink ETOH or use drugs.  No family history of dilated cardiomyopathy (FH of CAD).  He does have uncontrolled DM as well as a chronic wide LBBB (176 msec) which could contribute. Also noted to have frequent PVCs.  PCWP elevated on RHC, CI low at 1.67.   - Currently on Milrinone 0.25 mcg/kg/min Co-ox 61%. Estimated Fick 1.61.  - Volume status stable/low. CVP 0-2 Hold Torsemide tomorrow. Restart 3/28 - Continue digoxin 0.125 daily.  - Continue entresto 24/26mg  BID (BP soft but patient asymptomatic) - Continue spironolactone 25 daily.  - Hold Toprol XL for now with low output.  - Now undergoing LVAD work up.  He has low output HF with quite low EF.  Not a transplant candidate at this time with active smoking prior to admission.  Barriers for LVAD will be poor DM control (hgbA1c 12) and lack of insurance. - I suspect his LBBB is playing a part, but given his advanced symptoms, LV dilation, and lack of insurance I do not believe that CRT-D will meaningfully improve his clinical trajectory - PFTs with normal function  2. LV thrombus: continue heparin  3. PVCs/NSVT: Patient noted to have frequent PVCs on telemetry.   - With addition of milrinone, now on amiodarone 200 mg po bid.  - NSVT stable  4. CAD: Remote PCI to RCA, cath in 8/24 showed chronically occluded proximal RCA.  NSTEMI in 8/24 with occluded OM1 treated with DES.  LAD with  minimal disease.  No chest pain currently.  HS-TnI not significantly elevated, no ACS this admission.  - He is now off Plavix (> 6 months post PCI).  - Continue ASA 81.  - Continue statin.   5. DM2: Poor control, hgbA1c 12.   - Diabetes coordinator consult. - On standing insulin now + SSI.   6. Smoking: He has quit  HF social work helping with getting insurance possibly set up.    Length of Stay: 5   Alen Bleacher AGACNP-BC  04/17/2023, 9:38 AM  Advanced Heart Failure Team Pager [phone] (M-F; 7a -  4p)  Please contact [facility] for night-coverage after hours (4p -7a ) and weekends on [email or web address]

## 2023-04-17 NOTE — Progress Notes (Addendum)
 PHARMACY - ANTICOAGULATION CONSULT NOTE  Pharmacy Consult for heparin infusion Indication: Large LV thrombus   No Known Allergies  Patient Measurements: Height: 5\' 9"  (175.3 cm) Weight: 70.7 kg (155 lb 13.8 oz) IBW/kg (Calculated) : 70.7 Heparin Dosing Weight: 76.2 kg  Vital Signs: Temp: 98.4 F (36.9 C) (03/26 2157) Temp Source: Oral (03/26 2157) BP: 138/93 (03/26 2157) Pulse Rate: 99 (03/26 2157)  Labs: Recent Labs    04/15/23 0550 04/15/23 1430 04/16/23 0520 04/16/23 1343 04/17/23 0435 04/17/23 1510 04/17/23 2100  HGB 15.4  --  15.7  --  16.4  --   --   HCT 46.6  --  47.7  --  50.5  --   --   PLT 306  --  328  --  347  --   --   HEPARINUNFRC 0.65  --  0.83*   < > 0.87* 0.58 0.53  CREATININE 1.33* 1.08 1.04  --  1.16  --   --    < > = values in this interval not displayed.   Estimated Creatinine Clearance: 69.4 mL/min (by C-G formula based on SCr of 1.16 mg/dL).  Medical History: Past Medical History:  Diagnosis Date   Coronary artery disease    Diabetes mellitus without complication (HCC)    GERD (gastroesophageal reflux disease)    Hypertension    Stroke (cerebrum) (HCC)     Medications:  Scheduled:   amiodarone  200 mg Oral BID   aspirin EC  81 mg Oral Daily   atorvastatin  80 mg Oral Daily   busPIRone  5 mg Oral BID   Chlorhexidine Gluconate Cloth  6 each Topical Daily   digoxin  0.125 mg Oral Daily   insulin aspart  0-5 Units Subcutaneous QHS   insulin aspart  0-9 Units Subcutaneous TID WC   insulin aspart  11 Units Subcutaneous TID WC   insulin glargine-yfgn  25 Units Subcutaneous QHS   melatonin  2.5 mg Oral QHS   multivitamin with minerals  1 tablet Oral Daily   mupirocin ointment  1 Application Nasal BID   pantoprazole  40 mg Oral Daily   sacubitril-valsartan  1 tablet Oral BID   sodium chloride flush  10-40 mL Intracatheter Q12H   sodium chloride flush  3 mL Intravenous Q12H   spironolactone  25 mg Oral Daily   Assessment: 58 y.o. male  with CAD s/p PCI in 08/2022, hypertension, GERD, type 2 diabetes, hyperlipidemia, and tobacco use who presented with worsening shortness of breath over several months. Large LV thrombus noted on ECHO. No chronic anticoagulation noted based on dispense record. Pt does not have insurance.   3/26 PM:  Heparin level 0.58 therapeutic after rate reduction to 1300 units/hr.  No overt bleeding or complications noted.  Goal of Therapy:  Heparin level 0.3-0.7 units/ml Monitor platelets by anticoagulation protocol: Yes  Plan:  Empirically reduce IV heparin to 1250 units/hr to stay in range Confirmatory 8h level given ongoing supratherapeutic levels despite rate reductions Daily heparin level and CBC  ADDENDUM - confirmatory HL 0.53 on 1250 units/hr.    Trixie Rude, PharmD Clinical Pharmacist 04/17/2023  10:21 PM

## 2023-04-18 ENCOUNTER — Other Ambulatory Visit (HOSPITAL_COMMUNITY): Payer: Self-pay

## 2023-04-18 ENCOUNTER — Inpatient Hospital Stay (HOSPITAL_COMMUNITY): Payer: Self-pay

## 2023-04-18 DIAGNOSIS — I513 Intracardiac thrombosis, not elsewhere classified: Secondary | ICD-10-CM

## 2023-04-18 LAB — BASIC METABOLIC PANEL WITH GFR
Anion gap: 9 (ref 5–15)
BUN: 39 mg/dL — ABNORMAL HIGH (ref 6–20)
CO2: 27 mmol/L (ref 22–32)
Calcium: 9.1 mg/dL (ref 8.9–10.3)
Chloride: 98 mmol/L (ref 98–111)
Creatinine, Ser: 1.4 mg/dL — ABNORMAL HIGH (ref 0.61–1.24)
GFR, Estimated: 58 mL/min — ABNORMAL LOW (ref >60)
Glucose, Bld: 181 mg/dL — ABNORMAL HIGH (ref 70–99)
Potassium: 4.1 mmol/L (ref 3.5–5.1)
Sodium: 134 mmol/L — ABNORMAL LOW (ref 135–145)

## 2023-04-18 LAB — CBC
HCT: 48.8 % (ref 39.0–52.0)
Hemoglobin: 16.2 g/dL (ref 13.0–17.0)
MCH: 29 pg (ref 26.0–34.0)
MCHC: 33.2 g/dL (ref 30.0–36.0)
MCV: 87.5 fL (ref 80.0–100.0)
Platelets: 345 10*3/uL (ref 150–400)
RBC: 5.58 MIL/uL (ref 4.22–5.81)
RDW: 14.9 % (ref 11.5–15.5)
WBC: 9.4 10*3/uL (ref 4.0–10.5)
nRBC: 0 % (ref 0.0–0.2)

## 2023-04-18 LAB — COOXEMETRY PANEL
Carboxyhemoglobin: 1.6 % — ABNORMAL HIGH (ref 0.5–1.5)
Methemoglobin: 0.7 % (ref 0.0–1.5)
O2 Saturation: 78.4 %
Total hemoglobin: 16.7 g/dL — ABNORMAL HIGH (ref 12.0–16.0)

## 2023-04-18 LAB — ECHOCARDIOGRAM LIMITED
Height: 69 in
Weight: 2465.62 [oz_av]

## 2023-04-18 LAB — HEPARIN LEVEL (UNFRACTIONATED): Heparin Unfractionated: 0.56 [IU]/mL (ref 0.30–0.70)

## 2023-04-18 LAB — GLUCOSE, CAPILLARY
Glucose-Capillary: 184 mg/dL — ABNORMAL HIGH (ref 70–99)
Glucose-Capillary: 167 mg/dL — ABNORMAL HIGH (ref 70–99)
Glucose-Capillary: 233 mg/dL — ABNORMAL HIGH (ref 70–99)
Glucose-Capillary: 99 mg/dL (ref 70–99)

## 2023-04-18 LAB — DIGOXIN LEVEL: Digoxin Level: 0.4 ng/mL — ABNORMAL LOW (ref 0.8–2.0)

## 2023-04-18 MED ORDER — INSULIN GLARGINE-YFGN 100 UNIT/ML ~~LOC~~ SOLN
27.0000 [IU] | Freq: Every day | SUBCUTANEOUS | Status: DC
  Administered 2023-04-18 – 2023-04-20 (×3): 27 [IU] via SUBCUTANEOUS
  Filled 2023-04-18 (×4): qty 0.27

## 2023-04-18 MED ORDER — PERFLUTREN LIPID MICROSPHERE
1.0000 mL | INTRAVENOUS | Status: AC | PRN
  Administered 2023-04-18: 2 mL via INTRAVENOUS

## 2023-04-18 NOTE — Progress Notes (Addendum)
 Advanced Heart Failure Rounding Note  Subjective:    On milrinone 0.25. Co-ox 78.4%  Discussion with Child psychotherapist and family at bedside. Plan to pursue marketplace insurance at the moment. Good urine output without diuresis. Mild increase in creatinine today to 1.4.   Objective:   Echo LVEF 10-15%, severely dilated LV (6.7 LVIDD) w/ large LV thrombus. RV mildly reduced.   LHC 8/24:  LM ok LAD minor irregs LCx 100% large OM1 -> PCI  RCA CTO prox RHC 04/11/23 RA 8 PA 53/26 (36) PW 26 PVR 3.1 Fick 3.2/1.7 PAPi 2.1  Weight Range:  Vital Signs:   Temp:  [98.2 F (36.8 C)-98.5 F (36.9 C)] 98.2 F (36.8 C) (03/27 0742) Pulse Rate:  [85-99] 85 (03/27 0352) Resp:  [16-19] 18 (03/27 0742) BP: (88-138)/(68-111) 133/111 (03/27 0742) SpO2:  [92 %-97 %] 97 % (03/27 0742) Weight:  [69.9 kg] 69.9 kg (03/27 0352) Last BM Date : 04/16/23  Weight change: Filed Weights   04/16/23 0500 04/17/23 0424 04/18/23 0352  Weight: 70.8 kg 70.7 kg 69.9 kg   Intake/Output:   Intake/Output Summary (Last 24 hours) at 04/18/2023 0834 Last data filed at 04/18/2023 0743 Gross per 24 hour  Intake 480 ml  Output 3000 ml  Net -2520 ml    CVP not hooked up Physical Exam: General:  well appearing.  No respiratory difficulty HEENT: normal Neck: supple. JVD flat.  Cor: RRR, no m/g/r Lungs: clear Extremities: no cyanosis, clubbing, rash, edema. PICC RUE Neuro: alert & oriented x 3. Moves all 4 extremities w/o difficulty. Affect pleasant.   Telemetry: NSR 80s. Brief bigeminy this morning. 5 beat NSVT  (Personally reviewed)     Labs: Basic Metabolic Panel: Recent Labs  Lab 04/14/23 0450 04/15/23 0550 04/15/23 1430 04/16/23 0520 04/17/23 0435 04/18/23 0612  NA 134* 135 133* 136 133* 134*  K 4.0 4.8 4.1 3.9 4.0 4.1  CL 97* 97* 96* 104 99 98  CO2 27 27 26 25 26 27   GLUCOSE 323* 394* 138* 249* 313* 181*  BUN 25* 27* 22* 20 30* 39*  CREATININE 1.27* 1.33* 1.08 1.04 1.16 1.40*  CALCIUM 9.1 8.9  9.3 8.6* 8.7* 9.1  MG 2.0 1.8 2.2 1.8 1.7  --     Liver Function Tests: No results for input(s): "AST", "ALT", "ALKPHOS", "BILITOT", "PROT", "ALBUMIN" in the last 168 hours.  No results for input(s): "LIPASE", "AMYLASE" in the last 168 hours. No results for input(s): "AMMONIA" in the last 168 hours.  CBC: Recent Labs  Lab 04/14/23 0450 04/15/23 0550 04/16/23 0520 04/17/23 0435 04/18/23 0612  WBC 8.8 8.3 9.2 9.0 9.4  HGB 16.4 15.4 15.7 16.4 16.2  HCT 49.8 46.6 47.7 50.5 48.8  MCV 88.5 88.6 88.2 87.7 87.5  PLT 304 306 328 347 345    Cardiac Enzymes: No results for input(s): "CKTOTAL", "CKMB", "CKMBINDEX", "TROPONINI" in the last 168 hours.  BNP: BNP (last 3 results) Recent Labs    02/18/23 1348 04/09/23 0121  BNP 697.3* 1,185.6*   ProBNP (last 3 results) No results for input(s): "PROBNP" in the last 8760 hours.  Other results:  Medications:   Scheduled Medications:  amiodarone  200 mg Oral BID   aspirin EC  81 mg Oral Daily   atorvastatin  80 mg Oral Daily   busPIRone  5 mg Oral BID   Chlorhexidine Gluconate Cloth  6 each Topical Daily   digoxin  0.125 mg Oral Daily   insulin aspart  0-5 Units Subcutaneous QHS  insulin aspart  0-9 Units Subcutaneous TID WC   insulin aspart  11 Units Subcutaneous TID WC   insulin glargine-yfgn  25 Units Subcutaneous QHS   melatonin  2.5 mg Oral QHS   multivitamin with minerals  1 tablet Oral Daily   mupirocin ointment  1 Application Nasal BID   pantoprazole  40 mg Oral Daily   sacubitril-valsartan  1 tablet Oral BID   sodium chloride flush  10-40 mL Intracatheter Q12H   sodium chloride flush  3 mL Intravenous Q12H   spironolactone  25 mg Oral Daily    Infusions:  heparin 1,300 Units/hr (04/18/23 0648)   milrinone 0.25 mcg/kg/min (04/18/23 0649)    PRN Medications: sodium chloride flush, sodium chloride flush  Kenneth [name] is a 58 y.o. male with a PMH of CAD with PCI to the RCA and OM, chronic heart failure with  reduced ejection fraction, diabetes, hyperlipidemia, hypertension, prior CVA, tobacco use who was admitted to [facility] w/ acute on chronic systolic heart failure w/ NYHA IV symptoms.  Has had progressive decline in functional status/exercise tolerance over the last year.    Assessment/Plan:  1. Acute on chronic systolic CHF: ?Primarily ischemic cardiomyopathy.  Echo 8/24 at time of last NSTEMI with EF 25-30%.  Echo this admission with EF 10-15%, severe LV dilation with LV thrombus, mild RV dysfunction.  His cardiomyopathy does seem out of proportion to his coronary disease.  He does not drink ETOH or use drugs.  No family history of dilated cardiomyopathy (FH of CAD).  He does have uncontrolled DM as well as a chronic wide LBBB (176 msec) which could contribute.   PCWP elevated on RHC, CI low at 1.67.   - Currently on Milrinone 0.25 mcg/kg/min with stable/improving coox  - Volume status stable, holding off on further diuretics - Continue digoxin 0.125 daily.  - Continue entresto 24/26mg  BID - Continue spironolactone 25 daily.  - Hold Toprol XL for now with low output.  - Now undergoing LVAD work up.  He has low output HF with quite low EF.  Not a transplant candidate at this time with active smoking prior to admission.  Barriers for LVAD will be poor DM control (hgbA1c 12) and lack of insurance. - I suspect his LBBB is playing a part, but given his advanced symptoms, LV dilation, and lack of insurance I do not believe that CRT-D will meaningfully improve his clinical trajectory - PFTs with normal function - Insurance continues to be the major barrier  2. LV thrombus: continue heparin. Appears to have resolved on recent cardiac MRI  3. PVCs/NSVT: Patient noted to have frequent PVCs on telemetry.   - With addition of milrinone, now on amiodarone 200 mg po bid.  - NSVT stable  4. CAD: Remote PCI to RCA, cath in 8/24 showed chronically occluded proximal RCA.  NSTEMI in 8/24 with occluded OM1 treated  with DES.  LAD with minimal disease.  No chest pain currently.  HS-TnI not significantly elevated, no ACS this admission.  - He is now off Plavix (> 6 months post PCI).  - Continue ASA 81.  - Continue statin.   5. DM2: Poor control, hgbA1c 12.   - Diabetes coordinator consult. - On standing insulin now + SSI.  - Will need to form a plan for possible discharge without insurance  6. Smoking: He has quit  HF social work helping with getting insurance possibly set up.    Length of Stay: 6   Jamse Arn  Elwyn Lade, MD 04/18/2023, 8:34 AM  Advanced Heart Failure Team Pager [phone] (M-F; 7a - 4p)  Please contact [facility] for night-coverage after hours (4p -7a ) and weekends on [email or web address]

## 2023-04-18 NOTE — Plan of Care (Signed)
   Problem: Education: Goal: Knowledge of General Education information will improve Description: Including pain rating scale, medication(s)/side effects and non-pharmacologic comfort measures Outcome: Progressing   Problem: Activity: Goal: Risk for activity intolerance will decrease Outcome: Progressing   Problem: Coping: Goal: Level of anxiety will decrease Outcome: Progressing

## 2023-04-18 NOTE — Progress Notes (Signed)
 PHARMACY - ANTICOAGULATION CONSULT NOTE  Pharmacy Consult for heparin infusion Indication: Large LV thrombus   No Known Allergies  Patient Measurements: Height: 5\' 9"  (175.3 cm) Weight: 69.9 kg (154 lb 1.6 oz) IBW/kg (Calculated) : 70.7 Heparin Dosing Weight: 76.2 kg  Vital Signs: Temp: 98.2 F (36.8 C) (03/27 0742) Temp Source: Oral (03/27 0742) BP: 133/111 (03/27 0742) Pulse Rate: 85 (03/27 0352)  Labs: Recent Labs    04/16/23 0520 04/16/23 1343 04/17/23 0435 04/17/23 1510 04/17/23 2100 04/18/23 0612 04/18/23 0613  HGB 15.7  --  16.4  --   --  16.2  --   HCT 47.7  --  50.5  --   --  48.8  --   PLT 328  --  347  --   --  345  --   HEPARINUNFRC 0.83*   < > 0.87* 0.58 0.53  --  0.56  CREATININE 1.04  --  1.16  --   --  1.40*  --    < > = values in this interval not displayed.   Estimated Creatinine Clearance: 56.9 mL/min (A) (by C-G formula based on SCr of 1.4 mg/dL (H)).  Medical History: Past Medical History:  Diagnosis Date   Coronary artery disease    Diabetes mellitus without complication (HCC)    GERD (gastroesophageal reflux disease)    Hypertension    Stroke (cerebrum) (HCC)     Medications:  Scheduled:   amiodarone  200 mg Oral BID   aspirin EC  81 mg Oral Daily   atorvastatin  80 mg Oral Daily   busPIRone  5 mg Oral BID   Chlorhexidine Gluconate Cloth  6 each Topical Daily   digoxin  0.125 mg Oral Daily   insulin aspart  0-5 Units Subcutaneous QHS   insulin aspart  0-9 Units Subcutaneous TID WC   insulin aspart  11 Units Subcutaneous TID WC   insulin glargine-yfgn  27 Units Subcutaneous QHS   melatonin  2.5 mg Oral QHS   multivitamin with minerals  1 tablet Oral Daily   mupirocin ointment  1 Application Nasal BID   pantoprazole  40 mg Oral Daily   sacubitril-valsartan  1 tablet Oral BID   sodium chloride flush  10-40 mL Intracatheter Q12H   sodium chloride flush  3 mL Intravenous Q12H   spironolactone  25 mg Oral Daily   Assessment: 58  y.o. male with CAD s/p PCI in 08/2022, hypertension, GERD, type 2 diabetes, hyperlipidemia, and tobacco use who presented with worsening shortness of breath over several months. Large LV thrombus noted on ECHO. No chronic anticoagulation noted based on dispense record. Pt does not have insurance.   3/27 AM:  Heparin level 0.56 therapeutic after rate reduction to 1300 units/hr.  No overt bleeding or complications noted.  Goal of Therapy:  Heparin level 0.3-0.7 units/ml Monitor platelets by anticoagulation protocol: Yes  Plan:  Continue IV heparin at 1300 units/hr. Daily heparin level and CBC  Reece Leader, Colon Flattery, [facility] Clinical Pharmacist  04/18/2023 9:45 AM   [facility] pharmacy phone numbers are listed on [email or web address]

## 2023-04-18 NOTE — TOC Progression Note (Signed)
 Transition of Care [facility]) - Progression Note    Patient Details  Name: QUINTON VOTH MRN: [id number] Date of Birth: 04/03/1965  Transition of Care [facility]) CM/SW Contact  Elliot Cousin, RN Phone Number: [phone] 04/18/2023, 5:13 PM  Clinical Narrative:     TOC CM following for dc needs, Will need MATCH for medication assistance. Will arrange hospital follow up with PCP.     Expected Discharge Plan: IP Rehab Facility Barriers to Discharge: Continued Medical Work up  Expected Discharge Plan and Services   Discharge Planning Services: CM Consult   Living arrangements for the past 2 months: Single Family Home                                       Social Determinants of Health (SDOH) Interventions SDOH Screenings   Food Insecurity: No Food Insecurity (04/12/2023)  Housing: Low Risk  (04/12/2023)  Transportation Needs: No Transportation Needs (04/12/2023)  Utilities: Not At Risk (04/12/2023)  Financial Resource Strain: Low Risk  (02/18/2023)   Received from [facility]  Social Connections: Unknown (04/09/2023)  Tobacco Use: High Risk (04/14/2023)    Readmission Risk Interventions     No data to display

## 2023-04-19 LAB — BASIC METABOLIC PANEL WITH GFR
Anion gap: 9 (ref 5–15)
BUN: 31 mg/dL — ABNORMAL HIGH (ref 6–20)
CO2: 26 mmol/L (ref 22–32)
Calcium: 9.3 mg/dL (ref 8.9–10.3)
Chloride: 100 mmol/L (ref 98–111)
Creatinine, Ser: 1.25 mg/dL — ABNORMAL HIGH (ref 0.61–1.24)
GFR, Estimated: >60 mL/min (ref >60)
Glucose, Bld: 220 mg/dL — ABNORMAL HIGH (ref 70–99)
Potassium: 4.6 mmol/L (ref 3.5–5.1)
Sodium: 135 mmol/L (ref 135–145)

## 2023-04-19 LAB — CBC
HCT: 48.9 % (ref 39.0–52.0)
Hemoglobin: 15.9 g/dL (ref 13.0–17.0)
MCH: 29.1 pg (ref 26.0–34.0)
MCHC: 32.5 g/dL (ref 30.0–36.0)
MCV: 89.6 fL (ref 80.0–100.0)
Platelets: 349 10*3/uL (ref 150–400)
RBC: 5.46 MIL/uL (ref 4.22–5.81)
RDW: 15.1 % (ref 11.5–15.5)
WBC: 8.3 10*3/uL (ref 4.0–10.5)
nRBC: 0 % (ref 0.0–0.2)

## 2023-04-19 LAB — HEPARIN LEVEL (UNFRACTIONATED)
Heparin Unfractionated: 0.8 [IU]/mL — ABNORMAL HIGH (ref 0.30–0.70)
Heparin Unfractionated: 0.61 [IU]/mL (ref 0.30–0.70)

## 2023-04-19 LAB — GLUCOSE, CAPILLARY
Glucose-Capillary: 132 mg/dL — ABNORMAL HIGH (ref 70–99)
Glucose-Capillary: 220 mg/dL — ABNORMAL HIGH (ref 70–99)

## 2023-04-19 LAB — COOXEMETRY PANEL
Carboxyhemoglobin: 1.6 % — ABNORMAL HIGH (ref 0.5–1.5)
Methemoglobin: 0.7 % (ref 0.0–1.5)
O2 Saturation: 74.3 %
Total hemoglobin: 16.4 g/dL — ABNORMAL HIGH (ref 12.0–16.0)

## 2023-04-19 LAB — MAGNESIUM: Magnesium: 1.9 mg/dL (ref 1.7–2.4)

## 2023-04-19 MED ORDER — MAGNESIUM SULFATE 2 GM/50ML IV SOLN
2.0000 g | Freq: Once | INTRAVENOUS | Status: AC
  Administered 2023-04-19: 2 g via INTRAVENOUS
  Filled 2023-04-19: qty 50

## 2023-04-19 NOTE — Progress Notes (Signed)
 Nutrition Brief Note  Patient requesting to speak with RD as he feels he is not getting adequate nutrition d/t restrictions in place on carb modified diet. States that he received only half [place] sandwich for last night's at bedtime snack. Discussed concerns with patient and MD. MD amicable to liberalizing diet to optimize nutrition prior to LVAD placement. Order updated and diabetes coordinator alerted to the possible need for insulin regimen modification 2/2 increased carbohydrate intake.   Given his blood sugar control at baseline, he is likely to continue to fluctuate significantly independently of food choices. He is making desirable carbohydrate choices like whole wheat bread and fruits w/ cottage cheese per review or most recently ordered meals. Discussed, with patient, the importance of continuing to make appropriate carbohydrate choices while admitted to promote improved blood sugar control and become appropriate for surgery. He verbalizes understanding.  INTERVENTION:  Multivitamin w/ minerals daily Discussed importance of nutrition prior to and status post LVAD placement Double protein with all meals w/ manager check  Liberalize to regular diet w/ fluid restriction Continue QHS snack of [place] sandwich on whole wheat   NUTRITION DIAGNOSIS:  Moderate Malnutrition related to chronic illness as evidenced by mild fat depletion, mild muscle depletion, moderate muscle depletion. - remains applicable   GOAL:  Patient will meet greater than or equal to 90% of their needs - progressing   MONITOR:  PO intake, Labs, Weight trends, Supplement acceptance  Myrtie Cruise MS, RD, LDN Registered Dietitian Clinical Nutrition RD Inpatient Contact Info in Amion

## 2023-04-19 NOTE — Plan of Care (Signed)

## 2023-04-19 NOTE — Inpatient Diabetes Management (Addendum)
 Inpatient Diabetes Program Recommendations  AACE/ADA: New Consensus Statement on Inpatient Glycemic Control (2015)  Target Ranges:  Prepandial:   less than 140 mg/dL      Peak postprandial:   less than 180 mg/dL (1-2 hours)      Critically ill patients:  140 - 180 mg/dL   Lab Results  Component Value Date   GLUCAP 220 (H) 04/19/2023   HGBA1C 12.0 (H) 04/09/2023    Review of Glycemic Control  Latest Reference Range & Units 04/18/23 06:08 04/18/23 11:06 04/18/23 16:14 04/18/23 21:08 04/19/23 06:13 04/19/23 11:23  Glucose-Capillary 70 - 99 mg/dL 409 (H) 811 (H) 914 (H) 99 132 (H) 220 (H)   Diabetes history: DM type 2 Outpatient Diabetes medications: metformin in the past Current orders for Inpatient glycemic control:  Semglee 27 units qhs Novolog 0-9 units tid + hs Novolog 11 units tid meal coverage  A1c 12% on 3/18  Inpatient Diabetes Program Recommendations:    -   Increase Novolog meal coverage to 12-13 units tid if eating >50% of meals  Spoke with pt at bedside. Pt says the finger sticks are a lot. Messaged Dr. Elwyn Lade to see if he would approve the pt using CGM for glucose checks with [facility] once a shift for glucose verification. RN to chart glucose in the "CGM flowsheet."  Freestyle Libre 3 placed on pt left arm. Assisted pt in download of app on his cell phone. RN to chart glucose trends in "CGM flowsheet" sensor to be replaced in 15 days. Check fingerstick bid, once each shift.  Thanks,  Christena Deem RN, MSN, BC-ADM Inpatient Diabetes Coordinator Team Pager [phone] (8a-5p)

## 2023-04-19 NOTE — Progress Notes (Addendum)
 PHARMACY - ANTICOAGULATION CONSULT NOTE  Pharmacy Consult for heparin infusion Indication: Large LV thrombus   No Known Allergies  Patient Measurements: Height: 5\' 9"  (175.3 cm) Weight: 71.7 kg (158 lb 1.1 oz) IBW/kg (Calculated) : 70.7 Heparin Dosing Weight: 76.2 kg  Vital Signs: Temp: 98.1 F (36.7 C) (03/28 0424) Temp Source: Oral (03/28 0424) BP: 112/69 (03/28 0424) Pulse Rate: 71 (03/28 0424)  Labs: Recent Labs    04/17/23 0435 04/17/23 1510 04/17/23 2100 04/18/23 0612 04/18/23 0613 04/19/23 0551 04/19/23 0552  HGB 16.4  --   --  16.2  --   --  15.9  HCT 50.5  --   --  48.8  --   --  48.9  PLT 347  --   --  345  --   --  349  HEPARINUNFRC 0.87*   < > 0.53  --  0.56 0.80*  --   CREATININE 1.16  --   --  1.40*  --   --  1.25*   < > = values in this interval not displayed.   Estimated Creatinine Clearance: 64.4 mL/min (A) (by C-G formula based on SCr of 1.25 mg/dL (H)).  Medical History: Past Medical History:  Diagnosis Date   Coronary artery disease    Diabetes mellitus without complication (HCC)    GERD (gastroesophageal reflux disease)    Hypertension    Stroke (cerebrum) (HCC)     Medications:  Scheduled:   amiodarone  200 mg Oral BID   aspirin EC  81 mg Oral Daily   atorvastatin  80 mg Oral Daily   busPIRone  5 mg Oral BID   Chlorhexidine Gluconate Cloth  6 each Topical Daily   digoxin  0.125 mg Oral Daily   insulin aspart  0-5 Units Subcutaneous QHS   insulin aspart  0-9 Units Subcutaneous TID WC   insulin aspart  11 Units Subcutaneous TID WC   insulin glargine-yfgn  27 Units Subcutaneous QHS   melatonin  2.5 mg Oral QHS   multivitamin with minerals  1 tablet Oral Daily   mupirocin ointment  1 Application Nasal BID   pantoprazole  40 mg Oral Daily   sacubitril-valsartan  1 tablet Oral BID   sodium chloride flush  10-40 mL Intracatheter Q12H   sodium chloride flush  3 mL Intravenous Q12H   spironolactone  25 mg Oral Daily   Assessment: 58  y.o. male with CAD s/p PCI in 08/2022, hypertension, GERD, type 2 diabetes, hyperlipidemia, and tobacco use who presented with worsening shortness of breath over several months. Large LV thrombus noted on ECHO. No chronic anticoagulation noted based on dispense record. Pt does not have insurance.   3/27 AM:  Heparin level 0.80 is supratherapeutic on 1300 units/hr.  No overt bleeding or complications noted.  Confirmed heparin was paused/PICC flushed before drawing.    PM - heparin level 0.61, therapeutic. No issues  Goal of Therapy:  Heparin level 0.3-0.7 units/ml Monitor platelets by anticoagulation protocol: Yes  Plan:  Continue IV heparin to 1200 units/hr Daily heparin level and CBC  Trixie Rude, PharmD Clinical Pharmacist 04/19/2023  7:29 AM

## 2023-04-19 NOTE — Progress Notes (Signed)
 Advanced Heart Failure Rounding Note  Subjective:    On milrinone 0.25. Co-ox 74.  Patient overall stable on milrinone therapy. Meeting with marketplace insurance representative tomorrow at 1pm.     Objective:   Echo LVEF 10-15%, severely dilated LV (6.7 LVIDD) w/ large LV thrombus. RV mildly reduced.   LHC 8/24:  LM ok LAD minor irregs LCx 100% large OM1 -> PCI  RCA CTO prox RHC 04/11/23 RA 8 PA 53/26 (36) PW 26 PVR 3.1 Fick 3.2/1.7 PAPi 2.1  Weight Range:  Vital Signs:   Temp:  [97.8 F (36.6 C)-98.6 F (37 C)] 98.6 F (37 C) (03/28 1124) Pulse Rate:  [71-84] 84 (03/28 0941) Resp:  [16-19] 19 (03/28 1124) BP: (107-121)/(65-81) 117/78 (03/28 1124) SpO2:  [96 %-98 %] 98 % (03/28 1124) Weight:  [71.7 kg] 71.7 kg (03/28 0424) Last BM Date : 04/19/23  Weight change: Filed Weights   04/17/23 0424 04/18/23 0352 04/19/23 0424  Weight: 70.7 kg 69.9 kg 71.7 kg   Intake/Output:   Intake/Output Summary (Last 24 hours) at 04/19/2023 1218 Last data filed at 04/19/2023 1153 Gross per 24 hour  Intake 247.35 ml  Output 1100 ml  Net -852.65 ml     Physical Exam: General:  well appearing.  No respiratory difficulty HEENT: normal Neck: supple. JVD flat.  Cor: RRR, no m/g/r Lungs: clear Extremities: no cyanosis, clubbing, rash, edema. PICC RUE Neuro: alert & oriented x 3. Moves all 4 extremities w/o difficulty. Affect pleasant.   Telemetry: NSR 80s. Brief bigeminy this morning. 5 beat NSVT  (Personally reviewed)     Labs: Basic Metabolic Panel: Recent Labs  Lab 04/15/23 0550 04/15/23 1430 04/16/23 0520 04/17/23 0435 04/18/23 0612 04/19/23 0552  NA 135 133* 136 133* 134* 135  K 4.8 4.1 3.9 4.0 4.1 4.6  CL 97* 96* 104 99 98 100  CO2 27 26 25 26 27 26   GLUCOSE 394* 138* 249* 313* 181* 220*  BUN 27* 22* 20 30* 39* 31*  CREATININE 1.33* 1.08 1.04 1.16 1.40* 1.25*  CALCIUM 8.9 9.3 8.6* 8.7* 9.1 9.3  MG 1.8 2.2 1.8 1.7  --  1.9    Liver Function Tests: No results  for input(s): "AST", "ALT", "ALKPHOS", "BILITOT", "PROT", "ALBUMIN" in the last 168 hours.  No results for input(s): "LIPASE", "AMYLASE" in the last 168 hours. No results for input(s): "AMMONIA" in the last 168 hours.  CBC: Recent Labs  Lab 04/15/23 0550 04/16/23 0520 04/17/23 0435 04/18/23 0612 04/19/23 0552  WBC 8.3 9.2 9.0 9.4 8.3  HGB 15.4 15.7 16.4 16.2 15.9  HCT 46.6 47.7 50.5 48.8 48.9  MCV 88.6 88.2 87.7 87.5 89.6  PLT 306 328 347 345 349    Cardiac Enzymes: No results for input(s): "CKTOTAL", "CKMB", "CKMBINDEX", "TROPONINI" in the last 168 hours.  BNP: BNP (last 3 results) Recent Labs    02/18/23 1348 04/09/23 0121  BNP 697.3* 1,185.6*   ProBNP (last 3 results) No results for input(s): "PROBNP" in the last 8760 hours.  Other results:  Medications:   Scheduled Medications:  amiodarone  200 mg Oral BID   aspirin EC  81 mg Oral Daily   atorvastatin  80 mg Oral Daily   busPIRone  5 mg Oral BID   digoxin  0.125 mg Oral Daily   insulin aspart  0-5 Units Subcutaneous QHS   insulin aspart  0-9 Units Subcutaneous TID WC   insulin aspart  11 Units Subcutaneous TID WC   insulin glargine-yfgn  27 Units Subcutaneous QHS   melatonin  2.5 mg Oral QHS   multivitamin with minerals  1 tablet Oral Daily   mupirocin ointment  1 Application Nasal BID   pantoprazole  40 mg Oral Daily   sacubitril-valsartan  1 tablet Oral BID   sodium chloride flush  10-40 mL Intracatheter Q12H   sodium chloride flush  3 mL Intravenous Q12H   spironolactone  25 mg Oral Daily    Infusions:  heparin 1,200 Units/hr (04/19/23 0947)   milrinone 0.25 mcg/kg/min (04/19/23 1153)    PRN Medications: sodium chloride flush, sodium chloride flush  Kenneth [name] is a 58 y.o. male with a PMH of CAD with PCI to the RCA and OM, chronic heart failure with reduced ejection fraction, diabetes, hyperlipidemia, hypertension, prior CVA, tobacco use who was admitted to [facility] w/ acute on chronic systolic  heart failure w/ NYHA IV symptoms.  Has had progressive decline in functional status/exercise tolerance over the last year.    Assessment/Plan:  1. Acute on chronic systolic CHF: ?Primarily ischemic cardiomyopathy.  Echo 8/24 at time of last NSTEMI with EF 25-30%.  Echo this admission with EF 10-15%, severe LV dilation with LV thrombus, mild RV dysfunction.  His cardiomyopathy does seem out of proportion to his coronary disease.  He does not drink ETOH or use drugs.  No family history of dilated cardiomyopathy (FH of CAD).  He does have uncontrolled DM as well as a chronic wide LBBB (176 msec) which could contribute.   PCWP elevated on RHC, CI low at 1.67.   - Currently on Milrinone 0.25 mcg/kg/min with coox of 74.  - Volume status stable, holding off on further diuretics - Continue digoxin 0.125 daily.  - Continue entresto 24/26mg  BID - Continue spironolactone 25 daily.  - Hold Toprol XL for now with low output.  - Now undergoing LVAD work up.  He has low output HF with quite low EF.  Not a transplant candidate at this time with active smoking prior to admission.  Barriers for LVAD will be poor DM control (hgbA1c 12) and lack of insurance. - I suspect his LBBB is playing a part, but given his advanced symptoms, LV dilation, and lack of insurance I do not believe that CRT-D will meaningfully improve his clinical trajectory - PFTs with normal function - Insurance continues to be the major barrier, meeting arranged for tomorrow  2. LV thrombus: continue heparin. Appears to have resolved on recent cardiac MRI, not seen on most recent echocardiogram 3/27.  3. PVCs/NSVT: Patient noted to have frequent PVCs on telemetry.   - With addition of milrinone, now on amiodarone 200 mg po bid.  - NSVT stable  4. CAD: Remote PCI to RCA, cath in 8/24 showed chronically occluded proximal RCA.  NSTEMI in 8/24 with occluded OM1 treated with DES.  LAD with minimal disease.  No chest pain currently.  HS-TnI not  significantly elevated, no ACS this admission.  - He is now off Plavix (> 6 months post PCI).  - Continue ASA 81.  - Continue statin.   5. DM2: Poor control, hgbA1c 12.   - Diabetes coordinator consult. - On standing insulin now + SSI.  - TOC clinic helping with discharge plan for insulin  6. Smoking: He has quit  HF social work helping with getting insurance possibly set up.    Length of Stay: 7   Romie Minus, MD 04/19/2023, 12:18 PM  Advanced Heart Failure Team Pager [phone] (M-F; 7a -  4p)  Please contact [facility] for night-coverage after hours (4p -7a ) and weekends on [email or web address]

## 2023-04-19 NOTE — Progress Notes (Signed)
 CSW met with family yesterday morning and throughout the day in regards to insurance process. CSW informed by [facility] office Nigel Bridgeman [phone]) that patient has multiple assets including motor vehicles and property and in addition the wife's income is over the limit. Patient was denied medicaid and a letter of proof was provided for application on [employer] site.   CSW met with patient and sister Cordelia Pen at bedside this morning and Cordelia Pen was able to contact a Administrator, Civil Service and made an appointment for tomorrow to pursue insurance. Sister was informed she will need denial letter from [facility] which was provided by the county office. CSW continues to follow and provide supportive needs as identified. Lasandra Beech, LCSW, CCSW-MCS [phone]

## 2023-04-20 LAB — COOXEMETRY PANEL
Carboxyhemoglobin: 1.6 % — ABNORMAL HIGH (ref 0.5–1.5)
Methemoglobin: <0.7 % (ref 0.0–1.5)
O2 Saturation: 67.7 %
Total hemoglobin: 16.1 g/dL — ABNORMAL HIGH (ref 12.0–16.0)

## 2023-04-20 LAB — BASIC METABOLIC PANEL WITH GFR
Anion gap: 8 (ref 5–15)
BUN: 21 mg/dL — ABNORMAL HIGH (ref 6–20)
CO2: 27 mmol/L (ref 22–32)
Calcium: 9.3 mg/dL (ref 8.9–10.3)
Chloride: 103 mmol/L (ref 98–111)
Creatinine, Ser: 1.04 mg/dL (ref 0.61–1.24)
GFR, Estimated: >60 mL/min (ref >60)
Glucose, Bld: 162 mg/dL — ABNORMAL HIGH (ref 70–99)
Potassium: 4.3 mmol/L (ref 3.5–5.1)
Sodium: 138 mmol/L (ref 135–145)

## 2023-04-20 LAB — CBC
HCT: 48.2 % (ref 39.0–52.0)
Hemoglobin: 15.5 g/dL (ref 13.0–17.0)
MCH: 28.7 pg (ref 26.0–34.0)
MCHC: 32.2 g/dL (ref 30.0–36.0)
MCV: 89.3 fL (ref 80.0–100.0)
Platelets: 372 10*3/uL (ref 150–400)
RBC: 5.4 MIL/uL (ref 4.22–5.81)
RDW: 15.2 % (ref 11.5–15.5)
WBC: 9.1 10*3/uL (ref 4.0–10.5)
nRBC: 0 % (ref 0.0–0.2)

## 2023-04-20 LAB — GLUCOSE, CAPILLARY
Glucose-Capillary: 115 mg/dL — ABNORMAL HIGH (ref 70–99)
Glucose-Capillary: 55 mg/dL — ABNORMAL LOW (ref 70–99)
Glucose-Capillary: 253 mg/dL — ABNORMAL HIGH (ref 70–99)

## 2023-04-20 LAB — HEPARIN LEVEL (UNFRACTIONATED): Heparin Unfractionated: 0.56 [IU]/mL (ref 0.30–0.70)

## 2023-04-20 MED ORDER — CHLORHEXIDINE GLUCONATE CLOTH 2 % EX PADS
6.0000 | MEDICATED_PAD | Freq: Every day | CUTANEOUS | Status: DC
  Administered 2023-04-20 – 2023-04-25 (×5): 6 via TOPICAL

## 2023-04-20 MED ORDER — INSULIN ASPART 100 UNIT/ML IJ SOLN
9.0000 [IU] | Freq: Three times a day (TID) | INTRAMUSCULAR | Status: DC
  Administered 2023-04-20 – 2023-04-23 (×8): 9 [IU] via SUBCUTANEOUS

## 2023-04-20 NOTE — Plan of Care (Signed)

## 2023-04-20 NOTE — Progress Notes (Signed)
 PHARMACY - ANTICOAGULATION CONSULT NOTE  Pharmacy Consult for heparin infusion Indication: Large LV thrombus   No Known Allergies  Patient Measurements: Height: 5\' 9"  (175.3 cm) Weight: 71.4 kg (157 lb 6.5 oz) IBW/kg (Calculated) : 70.7 Heparin Dosing Weight: 76.2 kg  Vital Signs: Temp: 97.7 F (36.5 C) (03/29 0728) Temp Source: Oral (03/29 0728) BP: 93/55 (03/29 0728) Pulse Rate: 87 (03/29 0854)  Labs: Recent Labs    04/18/23 0612 04/18/23 1610 04/19/23 0551 04/19/23 0552 04/19/23 1426 04/20/23 0559  HGB 16.2  --   --  15.9  --  15.5  HCT 48.8  --   --  48.9  --  48.2  PLT 345  --   --  349  --  372  HEPARINUNFRC  --    < > 0.80*  --  0.61 0.56  CREATININE 1.40*  --   --  1.25*  --  1.04   < > = values in this interval not displayed.   Estimated Creatinine Clearance: 77.4 mL/min (by C-G formula based on SCr of 1.04 mg/dL).  Medical History: Past Medical History:  Diagnosis Date   Coronary artery disease    Diabetes mellitus without complication (HCC)    GERD (gastroesophageal reflux disease)    Hypertension    Stroke (cerebrum) (HCC)     Medications:  Scheduled:   amiodarone  200 mg Oral BID   aspirin EC  81 mg Oral Daily   atorvastatin  80 mg Oral Daily   busPIRone  5 mg Oral BID   Chlorhexidine Gluconate Cloth  6 each Topical Q0600   digoxin  0.125 mg Oral Daily   insulin aspart  0-5 Units Subcutaneous QHS   insulin aspart  0-9 Units Subcutaneous TID WC   insulin aspart  9 Units Subcutaneous TID WC   insulin glargine-yfgn  27 Units Subcutaneous QHS   melatonin  2.5 mg Oral QHS   multivitamin with minerals  1 tablet Oral Daily   pantoprazole  40 mg Oral Daily   sacubitril-valsartan  1 tablet Oral BID   sodium chloride flush  10-40 mL Intracatheter Q12H   sodium chloride flush  3 mL Intravenous Q12H   spironolactone  25 mg Oral Daily   Assessment: 58 y.o. male with CAD s/p PCI in 08/2022, hypertension, GERD, type 2 diabetes, hyperlipidemia, and  tobacco use who presented with worsening shortness of breath over several months. Large LV thrombus noted on ECHO. No chronic anticoagulation noted based on dispense record. Pt does not have insurance.   3/28 AM:  Heparin level 0.56 is therapeutic on 1200 units/hr.  No overt bleeding or complications noted.  Confirmed heparin was paused/PICC flushed before drawing.    Goal of Therapy:  Heparin level 0.3-0.7 units/ml Monitor platelets by anticoagulation protocol: Yes  Plan:  Continue IV heparin to 1200 units/hr Daily heparin level and CBC  Trixie Rude, PharmD Clinical Pharmacist 04/20/2023  10:04 AM

## 2023-04-20 NOTE — Progress Notes (Signed)
 Advanced Heart Failure Rounding Note  Subjective:    On milrinone 0.25. Co-ox 68.  Creatinine normal. Awaiting insurance meeting to determine marketplace eligibility.     Objective:   Echo LVEF 10-15%, severely dilated LV (6.7 LVIDD) w/ large LV thrombus. RV mildly reduced.   LHC 8/24:  LM ok LAD minor irregs LCx 100% large OM1 -> PCI  RCA CTO prox RHC 04/11/23 RA 8 PA 53/26 (36) PW 26 PVR 3.1 Fick 3.2/1.7 PAPi 2.1  Weight Range:  Vital Signs:   Temp:  [97.7 F (36.5 C)-98.7 F (37.1 C)] 97.9 F (36.6 C) (03/29 1046) Pulse Rate:  [80-93] 80 (03/29 1046) Resp:  [18-20] 18 (03/29 1046) BP: (93-114)/(55-87) 103/68 (03/29 1046) SpO2:  [95 %-98 %] 95 % (03/29 1046) Weight:  [71.4 kg] 71.4 kg (03/29 0507) Last BM Date : 04/19/23  Weight change: Filed Weights   04/18/23 0352 04/19/23 0424 04/20/23 0507  Weight: 69.9 kg 71.7 kg 71.4 kg   Intake/Output:   Intake/Output Summary (Last 24 hours) at 04/20/2023 1153 Last data filed at 04/20/2023 1047 Gross per 24 hour  Intake 1240.07 ml  Output 2350 ml  Net -1109.93 ml     Physical Exam: General:  well appearing.  No respiratory difficulty HEENT: normal Neck: supple. JVD flat.  Cor: RRR, no m/g/r Lungs: clear Extremities: . PICC RUE Neuro: alert & oriented x 3. Moves all 4 extremities w/o difficulty. Affect pleasant.   Telemetry: NSR 80s. Brief bigeminy this morning. 5 beat NSVT  (Personally reviewed)     Labs: Basic Metabolic Panel: Recent Labs  Lab 04/15/23 0550 04/15/23 1430 04/16/23 0520 04/17/23 0435 04/18/23 0612 04/19/23 0552 04/20/23 0559  NA 135 133* 136 133* 134* 135 138  K 4.8 4.1 3.9 4.0 4.1 4.6 4.3  CL 97* 96* 104 99 98 100 103  CO2 27 26 25 26 27 26 27   GLUCOSE 394* 138* 249* 313* 181* 220* 162*  BUN 27* 22* 20 30* 39* 31* 21*  CREATININE 1.33* 1.08 1.04 1.16 1.40* 1.25* 1.04  CALCIUM 8.9 9.3 8.6* 8.7* 9.1 9.3 9.3  MG 1.8 2.2 1.8 1.7  --  1.9  --     Liver Function Tests: No results for  input(s): "AST", "ALT", "ALKPHOS", "BILITOT", "PROT", "ALBUMIN" in the last 168 hours.  No results for input(s): "LIPASE", "AMYLASE" in the last 168 hours. No results for input(s): "AMMONIA" in the last 168 hours.  CBC: Recent Labs  Lab 04/16/23 0520 04/17/23 0435 04/18/23 0612 04/19/23 0552 04/20/23 0559  WBC 9.2 9.0 9.4 8.3 9.1  HGB 15.7 16.4 16.2 15.9 15.5  HCT 47.7 50.5 48.8 48.9 48.2  MCV 88.2 87.7 87.5 89.6 89.3  PLT 328 347 345 349 372    Cardiac Enzymes: No results for input(s): "CKTOTAL", "CKMB", "CKMBINDEX", "TROPONINI" in the last 168 hours.  BNP: BNP (last 3 results) Recent Labs    02/18/23 1348 04/09/23 0121  BNP 697.3* 1,185.6*   ProBNP (last 3 results) No results for input(s): "PROBNP" in the last 8760 hours.  Other results:  Medications:   Scheduled Medications:  amiodarone  200 mg Oral BID   aspirin EC  81 mg Oral Daily   atorvastatin  80 mg Oral Daily   busPIRone  5 mg Oral BID   Chlorhexidine Gluconate Cloth  6 each Topical Q0600   digoxin  0.125 mg Oral Daily   insulin aspart  0-5 Units Subcutaneous QHS   insulin aspart  0-9 Units Subcutaneous TID WC  insulin aspart  9 Units Subcutaneous TID WC   insulin glargine-yfgn  27 Units Subcutaneous QHS   melatonin  2.5 mg Oral QHS   multivitamin with minerals  1 tablet Oral Daily   pantoprazole  40 mg Oral Daily   sacubitril-valsartan  1 tablet Oral BID   sodium chloride flush  10-40 mL Intracatheter Q12H   sodium chloride flush  3 mL Intravenous Q12H   spironolactone  25 mg Oral Daily    Infusions:  heparin 1,200 Units/hr (04/19/23 2207)   milrinone 0.25 mcg/kg/min (04/19/23 1637)    PRN Medications: sodium chloride flush, sodium chloride flush  Kenneth [name] is a 58 y.o. male with a PMH of CAD with PCI to the RCA and OM, chronic heart failure with reduced ejection fraction, diabetes, hyperlipidemia, hypertension, prior CVA, tobacco use who was admitted to [facility] w/ acute on chronic systolic  heart failure w/ NYHA IV symptoms.  Has had progressive decline in functional status/exercise tolerance over the last year.    Assessment/Plan:  1. Acute on chronic systolic CHF: ?Primarily ischemic cardiomyopathy.  Echo 8/24 at time of last NSTEMI with EF 25-30%.  Echo this admission with EF 10-15%, severe LV dilation with LV thrombus, mild RV dysfunction.  His cardiomyopathy does seem out of proportion to his coronary disease.  He does not drink ETOH or use drugs.  No family history of dilated cardiomyopathy (FH of CAD).  He does have uncontrolled DM as well as a chronic wide LBBB (176 msec) which could contribute.   PCWP elevated on RHC, CI low at 1.67.   - Currently on Milrinone 0.25 mcg/kg/min with coox of 68.  - Volume status stable, holding off on further diuretics - Continue digoxin 0.125 daily.  - Continue entresto 24/26mg  BID - Continue spironolactone 25 daily.  - Hold Toprol XL with low output.  - Now undergoing LVAD work up. Not a transplant candidate at this time with active smoking prior to admission.  Barriers for LVAD will be poor DM control (hgbA1c 12) and lack of insurance. - Repeat A1c on Monday - I suspect his LBBB is playing a part, but given his advanced symptoms, LV dilation, and lack of insurance I do not believe that CRT-D will meaningfully improve his clinical trajectory - PFTs with normal function - Insurance continues to be the major barrier, meeting today  2. LV thrombus: continue heparin. Appears to have resolved on recent cardiac MRI, not seen on most recent echocardiogram 3/27.  3. PVCs/NSVT: Patient noted to have frequent PVCs on telemetry.   - With addition of milrinone, now on amiodarone 200 mg po bid.  - NSVT stable  4. CAD: Remote PCI to RCA, cath in 8/24 showed chronically occluded proximal RCA.  NSTEMI in 8/24 with occluded OM1 treated with DES.  LAD with minimal disease.  No chest pain currently.  HS-TnI not significantly elevated, no ACS this admission.   - He is now off Plavix (> 6 months post PCI).  - Continue ASA 81.  - Continue statin.   5. DM2: Poor control, hgbA1c 12.   - Diabetes coordinator consult. - On standing insulin now + SSI.  - TOC clinic helping with discharge plan for insulin -   6. Smoking: He has quit  HF social work helping with getting insurance possibly set up.    Length of Stay: 8   Romie Minus, MD 04/20/2023, 11:53 AM  Advanced Heart Failure Team Pager [phone] (M-F; 7a - 4p)  Please  contact [facility] for night-coverage after hours (4p -7a ) and weekends on [email or web address]

## 2023-04-21 LAB — CBC
HCT: 48.4 % (ref 39.0–52.0)
Hemoglobin: 15.5 g/dL (ref 13.0–17.0)
MCH: 28.8 pg (ref 26.0–34.0)
MCHC: 32 g/dL (ref 30.0–36.0)
MCV: 89.8 fL (ref 80.0–100.0)
Platelets: 365 10*3/uL (ref 150–400)
RBC: 5.39 MIL/uL (ref 4.22–5.81)
RDW: 15.3 % (ref 11.5–15.5)
WBC: 9.7 10*3/uL (ref 4.0–10.5)
nRBC: 0 % (ref 0.0–0.2)

## 2023-04-21 LAB — GLUCOSE, CAPILLARY
Glucose-Capillary: 278 mg/dL — ABNORMAL HIGH (ref 70–99)
Glucose-Capillary: 190 mg/dL — ABNORMAL HIGH (ref 70–99)

## 2023-04-21 LAB — BASIC METABOLIC PANEL WITH GFR
Anion gap: 8 (ref 5–15)
BUN: 18 mg/dL (ref 6–20)
CO2: 25 mmol/L (ref 22–32)
Calcium: 9.4 mg/dL (ref 8.9–10.3)
Chloride: 105 mmol/L (ref 98–111)
Creatinine, Ser: 1.09 mg/dL (ref 0.61–1.24)
GFR, Estimated: >60 mL/min (ref >60)
Glucose, Bld: 118 mg/dL — ABNORMAL HIGH (ref 70–99)
Potassium: 4.4 mmol/L (ref 3.5–5.1)
Sodium: 138 mmol/L (ref 135–145)

## 2023-04-21 LAB — HEPARIN LEVEL (UNFRACTIONATED)
Heparin Unfractionated: 0.72 [IU]/mL — ABNORMAL HIGH (ref 0.30–0.70)
Heparin Unfractionated: >1.1 [IU]/mL — ABNORMAL HIGH (ref 0.30–0.70)
Heparin Unfractionated: 0.47 [IU]/mL (ref 0.30–0.70)

## 2023-04-21 LAB — COOXEMETRY PANEL
Carboxyhemoglobin: 1.6 % — ABNORMAL HIGH (ref 0.5–1.5)
Methemoglobin: 0.8 % (ref 0.0–1.5)
O2 Saturation: 67.8 %
Total hemoglobin: 16 g/dL (ref 12.0–16.0)

## 2023-04-21 LAB — MAGNESIUM: Magnesium: 1.9 mg/dL (ref 1.7–2.4)

## 2023-04-21 MED ORDER — INSULIN GLARGINE-YFGN 100 UNIT/ML ~~LOC~~ SOLN
25.0000 [IU] | Freq: Every day | SUBCUTANEOUS | Status: DC
  Administered 2023-04-21 – 2023-04-24 (×4): 25 [IU] via SUBCUTANEOUS
  Filled 2023-04-21 (×5): qty 0.25

## 2023-04-21 MED ORDER — MAGNESIUM SULFATE 2 GM/50ML IV SOLN
2.0000 g | Freq: Once | INTRAVENOUS | Status: AC
  Administered 2023-04-21: 2 g via INTRAVENOUS
  Filled 2023-04-21: qty 50

## 2023-04-21 MED ORDER — HEPARIN (PORCINE) 25000 UT/250ML-% IV SOLN
1100.0000 [IU]/h | INTRAVENOUS | Status: DC
  Administered 2023-04-21: 1000 [IU]/h via INTRAVENOUS
  Administered 2023-04-23: 1100 [IU]/h via INTRAVENOUS
  Filled 2023-04-21 (×3): qty 250

## 2023-04-21 NOTE — Plan of Care (Signed)
   Problem: Education: Goal: Knowledge of General Education information will improve Description Including pain rating scale, medication(s)/side effects and non-pharmacologic comfort measures Outcome: Progressing

## 2023-04-21 NOTE — Progress Notes (Signed)
 PHARMACY - ANTICOAGULATION CONSULT NOTE  Pharmacy Consult for heparin infusion Indication: Large LV thrombus   No Known Allergies  Patient Measurements: Height: 5\' 9"  (175.3 cm) Weight: 71.7 kg (158 lb 1.1 oz) IBW/kg (Calculated) : 70.7 Heparin Dosing Weight: 76.2 kg  Vital Signs: Temp: 98.3 F (36.8 C) (03/30 0422) Temp Source: Oral (03/30 0422) BP: 102/70 (03/30 0422) Pulse Rate: 72 (03/30 0422)  Labs: Recent Labs    04/19/23 0552 04/19/23 1426 04/20/23 0559 04/21/23 0500  HGB 15.9  --  15.5 15.5  HCT 48.9  --  48.2 48.4  PLT 349  --  372 365  HEPARINUNFRC  --  0.61 0.56 0.72*  CREATININE 1.25*  --  1.04 1.09   Estimated Creatinine Clearance: 73.9 mL/min (by C-G formula based on SCr of 1.09 mg/dL).  Medical History: Past Medical History:  Diagnosis Date   Coronary artery disease    Diabetes mellitus without complication (HCC)    GERD (gastroesophageal reflux disease)    Hypertension    Stroke (cerebrum) (HCC)     Medications:  Scheduled:   amiodarone  200 mg Oral BID   aspirin EC  81 mg Oral Daily   atorvastatin  80 mg Oral Daily   busPIRone  5 mg Oral BID   Chlorhexidine Gluconate Cloth  6 each Topical Q0600   digoxin  0.125 mg Oral Daily   insulin aspart  0-5 Units Subcutaneous QHS   insulin aspart  0-9 Units Subcutaneous TID WC   insulin aspart  9 Units Subcutaneous TID WC   insulin glargine-yfgn  27 Units Subcutaneous QHS   melatonin  2.5 mg Oral QHS   multivitamin with minerals  1 tablet Oral Daily   pantoprazole  40 mg Oral Daily   sacubitril-valsartan  1 tablet Oral BID   sodium chloride flush  10-40 mL Intracatheter Q12H   sodium chloride flush  3 mL Intravenous Q12H   spironolactone  25 mg Oral Daily   Assessment: 58 y.o. male with CAD s/p PCI in 08/2022, hypertension, GERD, type 2 diabetes, hyperlipidemia, and tobacco use who presented with worsening shortness of breath over several months. Large LV thrombus noted on ECHO. No chronic  anticoagulation noted based on dispense record. Pt does not have insurance.   3/30 AM:  Heparin level 0.72 is slightly supra-therapeutic on 1200 units/hr.  No overt bleeding or complications noted.   Goal of Therapy:  Heparin level 0.3-0.7 units/ml Monitor platelets by anticoagulation protocol: Yes  Plan:  Decrease IV heparin to 1150 units/hr 8h heparin level Daily heparin level and CBC  Arabella Merles, PharmD. Clinical Pharmacist 04/21/2023 6:07 AM

## 2023-04-21 NOTE — Progress Notes (Signed)
 Mobility Specialist Progress Note:    04/21/23 1400  Mobility  Activity Ambulated with assistance in hallway  Level of Assistance Modified independent, requires aide device or extra time  Assistive Device Other (Comment) (IV Pole)  Distance Ambulated (ft) 1000 ft  Activity Response Tolerated well  Mobility Referral Yes  Mobility visit 1 Mobility  Mobility Specialist Start Time (ACUTE ONLY) 1150  Mobility Specialist Stop Time (ACUTE ONLY) 1222  Mobility Specialist Time Calculation (min) (ACUTE ONLY) 32 min   Received pt in bed having no complaints and agreeable to mobility. Pt was asymptomatic throughout ambulation and returned to room w/o fault. Left in chair w/ call bell in reach and all needs met.   Kenneth [name] Mobility Specialist Please contact via Special educational needs teacher or Rehab office at [phone]

## 2023-04-21 NOTE — Progress Notes (Signed)
 PHARMACY - ANTICOAGULATION CONSULT NOTE  Pharmacy Consult for heparin Indication:  LV thrombus  Labs: Recent Labs    04/19/23 0552 04/19/23 1426 04/20/23 0559 04/21/23 0500 04/21/23 1400 04/21/23 2230  HGB 15.9  --  15.5 15.5  --   --   HCT 48.9  --  48.2 48.4  --   --   PLT 349  --  372 365  --   --   HEPARINUNFRC  --    < > 0.56 0.72* >1.10* 0.47  CREATININE 1.25*  --  1.04 1.09  --   --    < > = values in this interval not displayed.   Assessment/Plan:  58yo male therapeutic on heparin after rate change. Will continue infusion at current rate of 1000 units/hr and confirm stable with am labs.  Vernard Gambles, PharmD, BCPS 04/21/2023 11:19 PM

## 2023-04-21 NOTE — Progress Notes (Signed)
 MD notified of hypoglycemia blood sugar 59.

## 2023-04-21 NOTE — Progress Notes (Signed)
   Palliative Medicine Inpatient Follow Up Note HPI: Kenneth [name] is a 58 y.o. male with a PMH of CAD with PCI to the RCA and OM, chronic heart failure with reduced ejection fraction, diabetes, hyperlipidemia, hypertension, prior CVA, tobacco use who was admitted to [facility] w/ acute on chronic systolic heart failure w/ NYHA IV symptoms. Has had progressive decline in functional status/exercise tolerance over the last year. The PMT has been asked to support LVAD evaluation.   Today's Discussion 04/21/2023  *Please note that this is a verbal dictation therefore any spelling or grammatical errors are due to the "Dragon Medical One" system interpretation.  Chart reviewed inclusive of vital signs, progress notes, laboratory results, and diagnostic images.   I met Kenneth [name] at bedside this morning. He is resting comfortably in bed. We discussed that there was a lot of excitement yesterday that he had gotten approved for insurance and then was denied roughly an hour later. He shares the plan for a meeting tomorrow among the advanced heart failure team to determine next steps.   Offered support to [facility] given the level of difficult information he has received over the past week.   Kenneth [name] shares he is not ready to die yet and if support cannot be provided here then he will seek care elsewhere.   Questions and concerns addressed/Palliative Support Provided.   Objective Assessment: Vital Signs Vitals:   04/21/23 0422 04/21/23 0708  BP: 102/70 106/67  Pulse: 72 88  Resp: 18 18  Temp: 98.3 F (36.8 C) 97.7 F (36.5 C)  SpO2: 96%     Intake/Output Summary (Last 24 hours) at 04/21/2023 0953 Last data filed at 04/21/2023 0713 Gross per 24 hour  Intake 1078 ml  Output 2450 ml  Net -1372 ml   Last Weight  Most recent update: 04/21/2023  4:31 AM    Weight  71.7 kg (158 lb 1.1 oz)            Gen: Middle aged Caucasian M in NAD HEENT: moist mucous membranes CV: Regular rate and rhythm  PULM:  On RA,  breathing is even and nonlabored ABD: soft/nontender  EXT: No edema  Neuro: Alert and oriented x3    SUMMARY OF RECOMMENDATIONS   Full Code / Full Scope of Care  Awaiting insurance for VAD   Ongoing support  MDM: Moderate  ______________________________________________________________________________________ [name] [place] Palliative Medicine Team Team Cell Phone: [phone] Please utilize secure chat with additional questions, if there is no response within 30 minutes please call the above phone number  Palliative Medicine Team providers are available by phone from 7am to 7pm daily and can be reached through the team cell phone.  Should this patient require assistance outside of these hours, please call the patient's attending physician.

## 2023-04-21 NOTE — Progress Notes (Signed)
 PHARMACY - ANTICOAGULATION CONSULT NOTE  Pharmacy Consult for heparin infusion Indication: Large LV thrombus   No Known Allergies  Patient Measurements: Height: 5\' 9"  (175.3 cm) Weight: 71.7 kg (158 lb 1.1 oz) IBW/kg (Calculated) : 70.7 Heparin Dosing Weight: 76.2 kg  Vital Signs: Temp: 98.5 F (36.9 C) (03/30 1055) Temp Source: Oral (03/30 1055) BP: 107/65 (03/30 1055) Pulse Rate: 79 (03/30 1055)  Labs: Recent Labs    04/19/23 0552 04/19/23 1426 04/20/23 0559 04/21/23 0500 04/21/23 1400  HGB 15.9  --  15.5 15.5  --   HCT 48.9  --  48.2 48.4  --   PLT 349  --  372 365  --   HEPARINUNFRC  --    < > 0.56 0.72* >1.10*  CREATININE 1.25*  --  1.04 1.09  --    < > = values in this interval not displayed.   Estimated Creatinine Clearance: 73.9 mL/min (by C-G formula based on SCr of 1.09 mg/dL).  Medical History: Past Medical History:  Diagnosis Date   Coronary artery disease    Diabetes mellitus without complication (HCC)    GERD (gastroesophageal reflux disease)    Hypertension    Stroke (cerebrum) (HCC)     Medications:  Scheduled:   amiodarone  200 mg Oral BID   aspirin EC  81 mg Oral Daily   atorvastatin  80 mg Oral Daily   busPIRone  5 mg Oral BID   Chlorhexidine Gluconate Cloth  6 each Topical Q0600   digoxin  0.125 mg Oral Daily   insulin aspart  0-5 Units Subcutaneous QHS   insulin aspart  0-9 Units Subcutaneous TID WC   insulin aspart  9 Units Subcutaneous TID WC   insulin glargine-yfgn  25 Units Subcutaneous QHS   melatonin  2.5 mg Oral QHS   multivitamin with minerals  1 tablet Oral Daily   pantoprazole  40 mg Oral Daily   sacubitril-valsartan  1 tablet Oral BID   sodium chloride flush  10-40 mL Intracatheter Q12H   sodium chloride flush  3 mL Intravenous Q12H   spironolactone  25 mg Oral Daily   Assessment: 58 y.o. male with CAD s/p PCI in 08/2022, hypertension, GERD, type 2 diabetes, hyperlipidemia, and tobacco use who presented with worsening  shortness of breath over several months. Large LV thrombus noted on ECHO. No chronic anticoagulation noted based on dispense record. Pt does not have insurance.   3/30 AM:  Heparin level 0.72 is slightly supra-therapeutic on 1200 units/hr.  No overt bleeding or complications noted.   3/30 PM: heparin level >1.1 after rate reduction to 1150 units/hr.  Confirmed with RN that heparin was paused and line flushed before drawing from PICC.  Will pause x1h, restart at reduced rate and switch heparin to PIV.  Goal of Therapy:  Heparin level 0.3-0.7 units/ml Monitor platelets by anticoagulation protocol: Yes  Plan:  HOLD heparin infusion x 1 hour Decrease IV heparin to 1000 units/hr 6h heparin level Daily heparin level and CBC  Trixie Rude, PharmD Clinical Pharmacist 04/21/2023  2:49 PM

## 2023-04-21 NOTE — Progress Notes (Signed)
 Advanced Heart Failure Rounding Note  Subjective:    On milrinone 0.25. Co-ox stable at 68.  Unfortunately was denied insurance due to lack of a "qualifying life event". Will discuss tomorrow, but likely will either plan for transfer to [facility] if they are able to use charity care or try to pursue CRT-D and wean off milrinone in an attempt to bridge until open enrollment in December. Would plan on EP consult tomorrow to assess if CRT could be done without insurance.     Objective:   Echo LVEF 10-15%, severely dilated LV (6.7 LVIDD) w/ large LV thrombus. RV mildly reduced.   LHC 8/24:  LM ok LAD minor irregs LCx 100% large OM1 -> PCI  RCA CTO prox RHC 04/11/23 RA 8 PA 53/26 (36) PW 26 PVR 3.1 Fick 3.2/1.7 PAPi 2.1  Weight Range:  Vital Signs:   Temp:  [97.7 F (36.5 C)-98.6 F (37 C)] 98.5 F (36.9 C) (03/30 1055) Pulse Rate:  [72-88] 79 (03/30 1055) Resp:  [17-18] 18 (03/30 1055) BP: (102-114)/(65-70) 107/65 (03/30 1055) SpO2:  [96 %-98 %] 98 % (03/30 1055) Weight:  [71.7 kg] 71.7 kg (03/30 0422) Last BM Date : 04/19/23  Weight change: Filed Weights   04/19/23 0424 04/20/23 0507 04/21/23 0422  Weight: 71.7 kg 71.4 kg 71.7 kg   Intake/Output:   Intake/Output Summary (Last 24 hours) at 04/21/2023 1512 Last data filed at 04/21/2023 1100 Gross per 24 hour  Intake 720 ml  Output 2450 ml  Net -1730 ml     Physical Exam: General:  well appearing.  No respiratory difficulty HEENT: normal Neck: supple. JVD flat.  Cor: RRR, no m/g/r Lungs: clear Extremities: . PICC RUE Neuro: alert & oriented x 3. Moves all 4 extremities w/o difficulty. Affect pleasant.   Telemetry: NSR 80s. Brief bigeminy this morning. 5 beat NSVT  (Personally reviewed)     Labs: Basic Metabolic Panel: Recent Labs  Lab 04/15/23 1430 04/16/23 0520 04/17/23 0435 04/18/23 0612 04/19/23 0552 04/20/23 0559 04/21/23 0500  NA 133* 136 133* 134* 135 138 138  K 4.1 3.9 4.0 4.1 4.6 4.3 4.4  CL 96* 104  99 98 100 103 105  CO2 26 25 26 27 26 27 25   GLUCOSE 138* 249* 313* 181* 220* 162* 118*  BUN 22* 20 30* 39* 31* 21* 18  CREATININE 1.08 1.04 1.16 1.40* 1.25* 1.04 1.09  CALCIUM 9.3 8.6* 8.7* 9.1 9.3 9.3 9.4  MG 2.2 1.8 1.7  --  1.9  --  1.9    Liver Function Tests: No results for input(s): "AST", "ALT", "ALKPHOS", "BILITOT", "PROT", "ALBUMIN" in the last 168 hours.  No results for input(s): "LIPASE", "AMYLASE" in the last 168 hours. No results for input(s): "AMMONIA" in the last 168 hours.  CBC: Recent Labs  Lab 04/17/23 0435 04/18/23 0612 04/19/23 0552 04/20/23 0559 04/21/23 0500  WBC 9.0 9.4 8.3 9.1 9.7  HGB 16.4 16.2 15.9 15.5 15.5  HCT 50.5 48.8 48.9 48.2 48.4  MCV 87.7 87.5 89.6 89.3 89.8  PLT 347 345 349 372 365    Cardiac Enzymes: No results for input(s): "CKTOTAL", "CKMB", "CKMBINDEX", "TROPONINI" in the last 168 hours.  BNP: BNP (last 3 results) Recent Labs    02/18/23 1348 04/09/23 0121  BNP 697.3* 1,185.6*   ProBNP (last 3 results) No results for input(s): "PROBNP" in the last 8760 hours.  Other results:  Medications:   Scheduled Medications:  amiodarone  200 mg Oral BID   aspirin EC  81 mg Oral Daily   atorvastatin  80 mg Oral Daily   busPIRone  5 mg Oral BID   Chlorhexidine Gluconate Cloth  6 each Topical Q0600   digoxin  0.125 mg Oral Daily   insulin aspart  0-5 Units Subcutaneous QHS   insulin aspart  0-9 Units Subcutaneous TID WC   insulin aspart  9 Units Subcutaneous TID WC   insulin glargine-yfgn  25 Units Subcutaneous QHS   melatonin  2.5 mg Oral QHS   multivitamin with minerals  1 tablet Oral Daily   pantoprazole  40 mg Oral Daily   sacubitril-valsartan  1 tablet Oral BID   sodium chloride flush  10-40 mL Intracatheter Q12H   sodium chloride flush  3 mL Intravenous Q12H   spironolactone  25 mg Oral Daily    Infusions:  heparin     magnesium sulfate bolus IVPB 2 g (04/21/23 1433)   milrinone 0.25 mcg/kg/min (04/21/23 0433)     PRN Medications: sodium chloride flush, sodium chloride flush  Kenneth [name] is a 58 y.o. male with a PMH of CAD with PCI to the RCA and OM, chronic heart failure with reduced ejection fraction, diabetes, hyperlipidemia, hypertension, prior CVA, tobacco use who was admitted to [facility] w/ acute on chronic systolic heart failure w/ NYHA IV symptoms.  Has had progressive decline in functional status/exercise tolerance over the last year.    Assessment/Plan:  1. Acute on chronic systolic CHF: ?Primarily ischemic cardiomyopathy.  Echo 8/24 at time of last NSTEMI with EF 25-30%.  Echo this admission with EF 10-15%, severe LV dilation with LV thrombus, mild RV dysfunction.  His cardiomyopathy does seem out of proportion to his coronary disease.  He does not drink ETOH or use drugs.  No family history of dilated cardiomyopathy (FH of CAD).  He does have uncontrolled DM as well as a chronic wide LBBB (176 msec) which could contribute.   PCWP elevated on RHC, CI low at 1.67.   - Currently on Milrinone 0.25 mcg/kg/min with coox of 68.  - Volume status stable, holding off on further diuretics - Continue digoxin 0.125 daily.  - Continue entresto 24/26mg  BID - Continue spironolactone 25 daily.  - Hold Toprol XL with low output.  - Now undergoing LVAD work up. Not a transplant candidate at this time with active smoking prior to admission.  Barriers for LVAD will be poor DM control (hgbA1c 12) and lack of insurance. - Repeat A1c on Monday - I suspect his LBBB is playing a part, and without insurance may be able to bridge him until open enrollment - PFTs with normal function - Insurance continues to be the major barrier  2. LV thrombus: continue heparin. Appears to have resolved on recent cardiac MRI, not seen on most recent echocardiogram 3/27.  3. PVCs/NSVT: Patient noted to have frequent PVCs on telemetry.   - With addition of milrinone, now on amiodarone 200 mg po bid.  - NSVT stable  4. CAD: Remote  PCI to RCA, cath in 8/24 showed chronically occluded proximal RCA.  NSTEMI in 8/24 with occluded OM1 treated with DES.  LAD with minimal disease.  No chest pain currently.  HS-TnI not significantly elevated, no ACS this admission.  - He is now off Plavix (> 6 months post PCI).  - Continue ASA 81.  - Continue statin.   5. DM2: Poor control, hgbA1c 12.   - Diabetes coordinator consult. - On standing insulin now + SSI.  - TOC clinic  helping with discharge plan for insulin  6. Smoking: He has quit  HF social work helping with getting insurance possibly set up.    Length of Stay: 9   Romie Minus, MD 04/21/2023, 3:12 PM  Advanced Heart Failure Team Pager [phone] (M-F; 7a - 4p)  Please contact [facility] for night-coverage after hours (4p -7a ) and weekends on [email or web address]

## 2023-04-22 DIAGNOSIS — E440 Moderate protein-calorie malnutrition: Secondary | ICD-10-CM | POA: Insufficient documentation

## 2023-04-22 LAB — GLUCOSE, CAPILLARY
Glucose-Capillary: 253 mg/dL — ABNORMAL HIGH (ref 70–99)
Glucose-Capillary: 168 mg/dL — ABNORMAL HIGH (ref 70–99)

## 2023-04-22 LAB — CBC
HCT: 45.1 % (ref 39.0–52.0)
Hemoglobin: 14.5 g/dL (ref 13.0–17.0)
MCH: 28.6 pg (ref 26.0–34.0)
MCHC: 32.2 g/dL (ref 30.0–36.0)
MCV: 89 fL (ref 80.0–100.0)
Platelets: 351 10*3/uL (ref 150–400)
RBC: 5.07 MIL/uL (ref 4.22–5.81)
RDW: 15.2 % (ref 11.5–15.5)
WBC: 8.5 10*3/uL (ref 4.0–10.5)
nRBC: 0 % (ref 0.0–0.2)

## 2023-04-22 LAB — BASIC METABOLIC PANEL WITH GFR
Anion gap: 8 (ref 5–15)
BUN: 14 mg/dL (ref 6–20)
CO2: 27 mmol/L (ref 22–32)
Calcium: 8.8 mg/dL — ABNORMAL LOW (ref 8.9–10.3)
Chloride: 101 mmol/L (ref 98–111)
Creatinine, Ser: 1.13 mg/dL (ref 0.61–1.24)
GFR, Estimated: >60 mL/min (ref >60)
Glucose, Bld: 264 mg/dL — ABNORMAL HIGH (ref 70–99)
Potassium: 4.5 mmol/L (ref 3.5–5.1)
Sodium: 136 mmol/L (ref 135–145)

## 2023-04-22 LAB — COOXEMETRY PANEL
Carboxyhemoglobin: 1.4 % (ref 0.5–1.5)
Methemoglobin: <0.7 % (ref 0.0–1.5)
O2 Saturation: 72.1 %
Total hemoglobin: 14.6 g/dL (ref 12.0–16.0)

## 2023-04-22 LAB — MAGNESIUM: Magnesium: 2.1 mg/dL (ref 1.7–2.4)

## 2023-04-22 LAB — HEPARIN LEVEL (UNFRACTIONATED)
Heparin Unfractionated: 0.29 [IU]/mL — ABNORMAL LOW (ref 0.30–0.70)
Heparin Unfractionated: 0.47 [IU]/mL (ref 0.30–0.70)
Heparin Unfractionated: 0.33 [IU]/mL (ref 0.30–0.70)

## 2023-04-22 LAB — HEMOGLOBIN A1C
Hgb A1c MFr Bld: 11.6 % — ABNORMAL HIGH (ref 4.8–5.6)
Mean Plasma Glucose: 286.22 mg/dL

## 2023-04-22 NOTE — Inpatient Diabetes Management (Signed)
 Inpatient Diabetes Program Recommendations  AACE/ADA: New Consensus Statement on Inpatient Glycemic Control (2015)  Target Ranges:  Prepandial:   less than 140 mg/dL      Peak postprandial:   less than 180 mg/dL (1-2 hours)      Critically ill patients:  140 - 180 mg/dL   Lab Results  Component Value Date   GLUCAP 168 (H) 04/22/2023   HGBA1C 11.6 (H) 04/22/2023    Review of Glycemic Control  Latest Reference Range & Units 04/20/23 08:00 04/20/23 15:57 04/21/23 11:00 04/21/23 16:06 04/22/23 06:16 04/22/23 11:37  Glucose-Capillary 70 - 99 mg/dL 213 (H) 086 (H) 578 (H) 190 (H) 253 (H) 168 (H)  (H): Data is abnormally high Diabetes history: DM 2 Outpatient Diabetes medications: metformin  Current orders for Inpatient glycemic control:  [place] 25 units qhs Novolog 0-9 units tid + hs Novolog 9 units tid meal coverage  Inpatient Diabetes Program Recommendations:    Consider increasing Novolog to 11 units TID (assuming patient is consuming >50% of meals)  Thanks, Lujean Rave, MSN, RNC-OB Diabetes Coordinator [phone] (8a-5p)

## 2023-04-22 NOTE — Progress Notes (Signed)
 PHARMACY - ANTICOAGULATION CONSULT NOTE  Pharmacy Consult for heparin infusion Indication: Large LV thrombus   No Known Allergies  Patient Measurements: Height: 5\' 9"  (175.3 cm) Weight: 72 kg (158 lb 11.7 oz) IBW/kg (Calculated) : 70.7 Heparin Dosing Weight: 76.2 kg  Vital Signs: Temp: 98.4 F (36.9 C) (03/31 0727) Temp Source: Oral (03/31 0727) BP: 130/78 (03/31 0727) Pulse Rate: 98 (03/31 0727)  Labs: Recent Labs    04/20/23 0559 04/21/23 0500 04/21/23 1400 04/21/23 2230 04/22/23 0450  HGB 15.5 15.5  --   --  14.5  HCT 48.2 48.4  --   --  45.1  PLT 372 365  --   --  351  HEPARINUNFRC 0.56 0.72* >1.10* 0.47 0.29*  CREATININE 1.04 1.09  --   --  1.13   Estimated Creatinine Clearance: 71.3 mL/min (by C-G formula based on SCr of 1.13 mg/dL).  Medical History: Past Medical History:  Diagnosis Date   Coronary artery disease    Diabetes mellitus without complication (HCC)    GERD (gastroesophageal reflux disease)    Hypertension    Stroke (cerebrum) (HCC)     Medications:  Scheduled:   amiodarone  200 mg Oral BID   aspirin EC  81 mg Oral Daily   atorvastatin  80 mg Oral Daily   busPIRone  5 mg Oral BID   Chlorhexidine Gluconate Cloth  6 each Topical Q0600   digoxin  0.125 mg Oral Daily   insulin aspart  0-5 Units Subcutaneous QHS   insulin aspart  0-9 Units Subcutaneous TID WC   insulin aspart  9 Units Subcutaneous TID WC   insulin glargine-yfgn  25 Units Subcutaneous QHS   melatonin  2.5 mg Oral QHS   multivitamin with minerals  1 tablet Oral Daily   pantoprazole  40 mg Oral Daily   sacubitril-valsartan  1 tablet Oral BID   sodium chloride flush  10-40 mL Intracatheter Q12H   sodium chloride flush  3 mL Intravenous Q12H   spironolactone  25 mg Oral Daily   Assessment: 58 y.o. male with CAD s/p PCI in 08/2022, hypertension, GERD, type 2 diabetes, hyperlipidemia, and tobacco use who presented with worsening shortness of breath over several months. Large LV  thrombus noted on ECHO. No chronic anticoagulation noted based on dispense record. Pt does not have insurance.   3/31: heparin level 0.29 on 1000 units/hr.  Confirmed with RN heparin running through left PIV, level drawn from PICC. CBC stable.  No issues noted.  Goal of Therapy:  Heparin level 0.3-0.7 units/ml Monitor platelets by anticoagulation protocol: Yes  Plan:  Increase IV heparin to 1100 units/hr 6h heparin level Daily heparin level and CBC  Trixie Rude, PharmD Clinical Pharmacist 04/22/2023  9:42 AM

## 2023-04-22 NOTE — Plan of Care (Signed)

## 2023-04-22 NOTE — Ethics Note (Addendum)
 Ethics Consult Note  Initial contact w/ medical team 04/22/23, which was on patient's hospital day 10   Source of Consult: Dr Elwyn Lade   Current attending physician/service: Romie Minus, MD  Reason(s) for consult / relevant ethical question(s):    Information-gathering: Discussion with source of consult yes  Chart review @TODAY @    Narrative:  Medical situation:  Description of case from person requesting consult:  Mr. Dirocco has diagnosis of end stage heart failure. He was admitted 10 days ago and he has received aggressive medical therapy now achieving medical stability. The medical team has determined that he will benefit form a left ventricle assist device, but unfortunately Mr.Seats has no medical insurance. Considering his advanced disease, this device may prolong his life.   Social services has looked into different ways of getting insurance coverage and the medical team has personally called to tertiary care centers in order to get this device for Mr. Brau. Despite all this efforts this resource, at this point in time, is not available to him and this situation has created significant frustration in Mr. Zollars and his family.     From the medical ethical perspective the treating medical team is compromised in providing the best treatment to the patient, within it's level of expertise and available resources. In this particular situation unfortunately this advanced therapy is not available to the patient, inside or outside the health system.  The team is looking into other available treatment options for Mr.Broome.    Patient's decision-making capacity: yes Any formal meetings held: I will be available if Mr. Treaster or his family would like to set a meeting.     Recommendations / Ethical Analysis:  Continue looking into options that will help Mr Drummond maintain his medical stability and hopefully access more advance therapies, aligned with his goals of care.    AUTONOMY  Competence  Capacity  Informed Consent/Refusal  BENEFICENCE & NON-MALEFICENCE   Forcing or withholding treatment(s)  Emergency  JUSTICE  Principles equally/equitably applied  Triage  PATERNALISM   Unilateral decision-making by the attending physician  MEDICAL FUTILITY   Relevant underlying ethical principles were discussed directly with Dr Elwyn Lade and Brynda Peon NP  Other resources were discussed directly with them, if needed. Ethics committee strives to ensure that all necessary and appropriate steps are taken by the medical team such that all decisions made for this patient are ethically appropriate. Please note that Ethics committee members will NOT offer legal or medical counsel.   Thank you for this consult. Ethics will continue to follow this case.   Treatment team has my personal cell phone number and has my permission to share this number at their discretion.  Secure message on Epic is also welcome but may not receive an immediate response.  Please reference AMION for on-call committee member if needed.    Lateisha Thurlow [facility] Ethics Committee

## 2023-04-22 NOTE — Progress Notes (Signed)
 Mobility Specialist Progress Note;   04/22/23 1100  Mobility  Activity Ambulated with assistance in hallway (Outside)  Level of Assistance Modified independent, requires aide device or extra time  Assistive Device Other (Comment) (IV pole)  Distance Ambulated (ft) 1000 ft  Activity Response Tolerated well  Mobility Referral Yes  Mobility visit 1 Mobility  Mobility Specialist Start Time (ACUTE ONLY) 1100  Mobility Specialist Stop Time (ACUTE ONLY) 1122  Mobility Specialist Time Calculation (min) (ACUTE ONLY) 22 min   Pt requesting to ambulate outside w/ MS. Required no physical assistance during ambulation, ModI. Pt returned safely back to room with all needs met, left in chair w/ family.  Caesar Bookman Mobility Specialist Please contact via SecureChat or [employer] [phone]

## 2023-04-22 NOTE — Progress Notes (Addendum)
 Advanced Heart Failure Rounding Note  Subjective:    On milrinone 0.25. Co-ox stable at 72.  Friend at bedside. Feels ok, had a fair weekend.   Objective:   Echo LVEF 10-15%, severely dilated LV (6.7 LVIDD) w/ large LV thrombus. RV mildly reduced.   LHC 8/24:  LM ok LAD minor irregs LCx 100% large OM1 -> PCI  RCA CTO prox RHC 04/11/23 RA 8 PA 53/26 (36) PW 26 PVR 3.1 Fick 3.2/1.7 PAPi 2.1  Weight Range:  Vital Signs:   Temp:  [97.8 F (36.6 C)-98.5 F (36.9 C)] 98.3 F (36.8 C) (03/31 0447) Pulse Rate:  [77-79] 77 (03/30 1554) Resp:  [17-19] 17 (03/31 0447) BP: (106-116)/(64-69) 106/69 (03/31 0447) SpO2:  [98 %-99 %] 98 % (03/31 0447) Weight:  [72 kg] 72 kg (03/31 0447) Last BM Date : 04/19/23  Weight change: Filed Weights   04/20/23 0507 04/21/23 0422 04/22/23 0447  Weight: 71.4 kg 71.7 kg 72 kg   Intake/Output:   Intake/Output Summary (Last 24 hours) at 04/22/2023 0717 Last data filed at 04/22/2023 0447 Gross per 24 hour  Intake 430.92 ml  Output 1775 ml  Net -1344.08 ml     Physical Exam: General:  well appearing.  No respiratory difficulty HEENT: normal Neck: supple. JVD flat.  Cor: PMI nondisplaced. Regular rate & rhythm. No rubs, gallops or murmurs. Lungs: clear Abdomen: soft, nontender, nondistended. Good bowel sounds. Extremities: no cyanosis, clubbing, rash, edema. PICC RUE Neuro: alert & oriented x 3. Moves all 4 extremities w/o difficulty. Affect pleasant.   Telemetry: ST 100-110s  (Personally reviewed)     Labs: Basic Metabolic Panel: Recent Labs  Lab 04/15/23 1430 04/16/23 0520 04/17/23 0435 04/18/23 0612 04/19/23 0552 04/20/23 0559 04/21/23 0500 04/22/23 0450  NA 133* 136 133* 134* 135 138 138 136  K 4.1 3.9 4.0 4.1 4.6 4.3 4.4 4.5  CL 96* 104 99 98 100 103 105 101  CO2 26 25 26 27 26 27 25 27   GLUCOSE 138* 249* 313* 181* 220* 162* 118* 264*  BUN 22* 20 30* 39* 31* 21* 18 14  CREATININE 1.08 1.04 1.16 1.40* 1.25* 1.04 1.09 1.13   CALCIUM 9.3 8.6* 8.7* 9.1 9.3 9.3 9.4 8.8*  MG 2.2 1.8 1.7  --  1.9  --  1.9  --     Liver Function Tests: No results for input(s): "AST", "ALT", "ALKPHOS", "BILITOT", "PROT", "ALBUMIN" in the last 168 hours.  No results for input(s): "LIPASE", "AMYLASE" in the last 168 hours. No results for input(s): "AMMONIA" in the last 168 hours.  CBC: Recent Labs  Lab 04/18/23 0612 04/19/23 0552 04/20/23 0559 04/21/23 0500 04/22/23 0450  WBC 9.4 8.3 9.1 9.7 8.5  HGB 16.2 15.9 15.5 15.5 14.5  HCT 48.8 48.9 48.2 48.4 45.1  MCV 87.5 89.6 89.3 89.8 89.0  PLT 345 349 372 365 351    Cardiac Enzymes: No results for input(s): "CKTOTAL", "CKMB", "CKMBINDEX", "TROPONINI" in the last 168 hours.  BNP: BNP (last 3 results) Recent Labs    02/18/23 1348 04/09/23 0121  BNP 697.3* 1,185.6*   ProBNP (last 3 results) No results for input(s): "PROBNP" in the last 8760 hours.  Other results:  Medications:   Scheduled Medications:  amiodarone  200 mg Oral BID   aspirin EC  81 mg Oral Daily   atorvastatin  80 mg Oral Daily   busPIRone  5 mg Oral BID   Chlorhexidine Gluconate Cloth  6 each Topical Q0600   digoxin  0.125 mg Oral Daily   insulin aspart  0-5 Units Subcutaneous QHS   insulin aspart  0-9 Units Subcutaneous TID WC   insulin aspart  9 Units Subcutaneous TID WC   insulin glargine-yfgn  25 Units Subcutaneous QHS   melatonin  2.5 mg Oral QHS   multivitamin with minerals  1 tablet Oral Daily   pantoprazole  40 mg Oral Daily   sacubitril-valsartan  1 tablet Oral BID   sodium chloride flush  10-40 mL Intracatheter Q12H   sodium chloride flush  3 mL Intravenous Q12H   spironolactone  25 mg Oral Daily    Infusions:  heparin 1,000 Units/hr (04/21/23 1646)   milrinone 0.25 mcg/kg/min (04/21/23 1936)    PRN Medications: sodium chloride flush, sodium chloride flush  Kenneth [name] is a 58 y.o. male with a PMH of CAD with PCI to the RCA and OM, chronic heart failure with reduced  ejection fraction, diabetes, hyperlipidemia, hypertension, prior CVA, tobacco use who was admitted to [facility] w/ acute on chronic systolic heart failure w/ NYHA IV symptoms.  Has had progressive decline in functional status/exercise tolerance over the last year.    Assessment/Plan:  1. Acute on chronic systolic CHF: ?Primarily ischemic cardiomyopathy.  Echo 8/24 at time of last NSTEMI with EF 25-30%.  Echo this admission with EF 10-15%, severe LV dilation with LV thrombus, mild RV dysfunction.  His cardiomyopathy does seem out of proportion to his coronary disease.  He does not drink ETOH or use drugs.  No family history of dilated cardiomyopathy (FH of CAD).  He does have uncontrolled DM as well as a chronic wide LBBB (176 msec) which could contribute.   PCWP elevated on RHC, CI low at 1.67.   - Currently on Milrinone 0.25 mcg/kg/min with coox of 72.  - Volume status stable, holding off on further diuretics - Continue digoxin 0.125 daily.  - Continue entresto 24/26mg  BID - Continue spironolactone 25 daily.  - Holding off on SGLT2i until procedures complete - Hold Toprol XL with low output.  - Now undergoing LVAD work up. Not a transplant candidate at this time with active smoking prior to admission.  Barriers for LVAD will be poor DM control (hgbA1c 12) and lack of insurance. - Repeat A1c today - I suspect his LBBB is playing a part, and without insurance may be able to bridge him until open enrollment - PFTs with normal function - Insurance continues to be the major barrier  2. LV thrombus: continue heparin. Appears to have resolved on recent cardiac MRI, not seen on most recent echocardiogram 3/27.  3. PVCs/NSVT: Patient noted to have frequent PVCs on telemetry.   - With addition of milrinone, now on amiodarone 200 mg po bid.  - NSVT stable  4. CAD: Remote PCI to RCA, cath in 8/24 showed chronically occluded proximal RCA.  NSTEMI in 8/24 with occluded OM1 treated with DES.  LAD with minimal  disease.  No chest pain currently.  HS-TnI not significantly elevated, no ACS this admission.  - He is now off Plavix (> 6 months post PCI).  - Continue ASA 81.  - Continue statin.   5. DM2: Poor control, hgbA1c 12.   - Diabetes coordinator consult. - On standing insulin now + SSI.  - TOC clinic helping with discharge plan for insulin  6. Smoking: He has quit  Unfortunately was denied insurance due to lack of a "qualifying life event". Will discuss today, but likely will either plan for transfer  to [facility] if they are able to use charity care or try to pursue CRT-D and wean off milrinone in an attempt to bridge until open enrollment in December. Reached out to EP today to see if he can get CRT-D while admitted.   Length of Stay: [address], AGACNP-BC 04/22/2023, 7:17 AM  Advanced Heart Failure Team Pager [phone] (M-F; 7a - 4p)  Please contact [facility] for night-coverage after hours (4p -7a ) and weekends on [email or web address]

## 2023-04-22 NOTE — Progress Notes (Signed)
   Palliative Medicine Inpatient Follow Up Note HPI: Kenneth [name] is a 58 y.o. male with a PMH of CAD with PCI to the RCA and OM, chronic heart failure with reduced ejection fraction, diabetes, hyperlipidemia, hypertension, prior CVA, tobacco use who was admitted to [facility] w/ acute on chronic systolic heart failure w/ NYHA IV symptoms. Has had progressive decline in functional status/exercise tolerance over the last year. The PMT has been asked to support LVAD evaluation.   Today's Discussion 04/22/2023  *Please note that this is a verbal dictation therefore any spelling or grammatical errors are due to the "Dragon Medical One" system interpretation.  Chart reviewed inclusive of vital signs, progress notes, laboratory results, and diagnostic images.   I met with Kenneth [name] this morning. He is lying in bed in NAD. He shares with me great deal of frustration about his present clinical case. He states that he feels upset that insurance has not been authorized. He expresses a strong desire to live and shares if [facility] cannot help him then he would like to transfer to [facility].   We discussed the uncertainty associated with the future. Offered time and support through therapeutic presence as well as reflective listening.   Questions and concerns addressed.  Objective Assessment: Vital Signs Vitals:   04/22/23 0447 04/22/23 0727  BP: 106/69 130/78  Pulse:  98  Resp: 17 16  Temp: 98.3 F (36.8 C) 98.4 F (36.9 C)  SpO2: 98% 95%    Intake/Output Summary (Last 24 hours) at 04/22/2023 1113 Last data filed at 04/22/2023 0830 Gross per 24 hour  Intake 670.92 ml  Output 1575 ml  Net -904.08 ml   Last Weight  Most recent update: 04/22/2023  4:53 AM    Weight  72 kg (158 lb 11.7 oz)            Gen: Middle aged Caucasian M in NAD HEENT: moist mucous membranes CV: Regular rate and rhythm  PULM:  On RA, breathing is even and nonlabored ABD: soft/nontender  EXT: No edema  Neuro: Alert and oriented x3     SUMMARY OF RECOMMENDATIONS   Full Code / Full Scope of Care  Has not been approved for insurance, patients family searching for insurance though all requests have been denied to date  If patient is not approved for VAD at [facility] he is interested in transferring to [facility]   Ongoing support  MDM: Moderate  ______________________________________________________________________________________ [name] [place] Palliative Medicine Team Team Cell Phone: [phone] Please utilize secure chat with additional questions, if there is no response within 30 minutes please call the above phone number  Palliative Medicine Team providers are available by phone from 7am to 7pm daily and can be reached through the team cell phone.  Should this patient require assistance outside of these hours, please call the patient's attending physician.

## 2023-04-22 NOTE — Progress Notes (Signed)
 PHARMACY - ANTICOAGULATION CONSULT NOTE  Pharmacy Consult for heparin infusion Indication: Large LV thrombus   No Known Allergies  Patient Measurements: Height: 5\' 9"  (175.3 cm) Weight: Kenneth kg (158 lb 11.7 oz) IBW/kg (Calculated) : 70.7 Heparin Dosing Weight: 76.2 kg  Vital Signs: Temp: 98.1 F (36.7 C) (03/31 1737) Temp Source: Oral (03/31 1737) BP: 101/68 (03/31 1737) Pulse Rate: 80 (03/31 1737)  Labs: Recent Labs    04/20/23 0559 04/21/23 0500 04/21/23 1400 04/21/23 2230 04/22/23 0450 04/22/23 1730  HGB 15.5 15.5  --   --  14.5  --   HCT 48.2 48.4  --   --  45.1  --   PLT 372 365  --   --  351  --   HEPARINUNFRC 0.56 0.Kenneth*   < > 0.47 0.29* 0.47  CREATININE 1.04 1.09  --   --  1.13  --    < > = values in this interval not displayed.   Estimated Creatinine Clearance: 71.3 mL/min (by C-G formula based on SCr of 1.13 mg/dL).  Medical History: Past Medical History:  Diagnosis Date   Coronary artery disease    Diabetes mellitus without complication (HCC)    GERD (gastroesophageal reflux disease)    Hypertension    Stroke (cerebrum) (HCC)     Medications:  Scheduled:   amiodarone  200 mg Oral BID   aspirin EC  81 mg Oral Daily   atorvastatin  80 mg Oral Daily   busPIRone  5 mg Oral BID   Chlorhexidine Gluconate Cloth  6 each Topical Q0600   digoxin  0.125 mg Oral Daily   insulin aspart  0-5 Units Subcutaneous QHS   insulin aspart  0-9 Units Subcutaneous TID WC   insulin aspart  9 Units Subcutaneous TID WC   insulin glargine-yfgn  25 Units Subcutaneous QHS   melatonin  2.5 mg Oral QHS   multivitamin with minerals  1 tablet Oral Daily   pantoprazole  40 mg Oral Daily   sacubitril-valsartan  1 tablet Oral BID   sodium chloride flush  10-40 mL Intracatheter Q12H   sodium chloride flush  3 mL Intravenous Q12H   spironolactone  25 mg Oral Daily   Assessment: 58 y.o. Kenneth [name] with CAD s/p PCI in 08/2022, hypertension, GERD, type 2 diabetes, hyperlipidemia, and  tobacco use who presented with worsening shortness of breath over several months. Large LV thrombus noted on ECHO. No chronic anticoagulation noted based on dispense record. Pt does not have insurance.   3/31: heparin level 0.47 on 1100 units/hr.  Confirmed with RN heparin running through left PIV, level drawn from PICC. CBC stable.  No issues noted.  Goal of Therapy:  Heparin level 0.3-0.7 units/ml Monitor platelets by anticoagulation protocol: Yes  Plan:  Continue IV heparin at 1100 units/hr Check confirmatory heparin level in 6hrs Daily heparin level and CBC  Loralee Pacas, PharmD, BCPS Clinical Pharmacist 04/22/2023  5:58 PM

## 2023-04-22 NOTE — Consult Note (Signed)
 ELECTROPHYSIOLOGY CONSULT NOTE    Patient ID: Kenneth [name] MRN: [id number], DOB/AGE: [birth year] 59 y.o.  Admit date: 04/12/2023 Date of Consult: 04/22/2023  Primary Physician: Marguarite Arbour, MD Primary Cardiologist: None  Electrophysiologist: Dr. Nelly Laurence   Referring Provider: Dr. Elwyn Lade  Patient Profile: Kenneth [name] is a 57 y.o. male with a history of CAD s/p PCI to RCA/OM, HFrEF, HTN, HLD, CVA, DM, tobacco abuse who is being seen today for the evaluation of LBBB at the request of Dr. Elwyn Lade.  HPI:  Kenneth [name] is a 58 y.o. male who initially presented to [facility] 04/09/23 with acute on chronic systolic HF.    Per pt/family he has had a significant functional decline over the last year. ECHO showed LVEF 10-15%, severely dilated LV with large LV thrombus, RV systolic mildly reduced. He was started on heparin infusion. He was transferred to [facility] for further evaluation by Advanced HF Team. He underwent RHC on 04/11/23 which demonstrated severely elevated left sided filling pressures with severely low CI.  Given his recent smoking hx, he would not be eligible for transplant.  He was started on inotrope's, IV diuresis by the AHF Team and is in process for VAD work up.   He denies chest pain, palpitations, dyspnea, PND, orthopnea, nausea, vomiting, dizziness, syncope, edema, weight gain, or early satiety.   Labs Potassium4.5 (03/31 0450) Magnesium  2.1 (03/31 0500) Creatinine, ser  1.13 (03/31 0450) PLT  351 (03/31 0450) HGB  14.5 (03/31 0450) WBC 8.5 (03/31 0450)  .    Past Medical History:  Diagnosis Date   Coronary artery disease    Diabetes mellitus without complication (HCC)    GERD (gastroesophageal reflux disease)    Hypertension    Stroke (cerebrum) (HCC)      Surgical History:  Past Surgical History:  Procedure Laterality Date   CORONARY ANGIOPLASTY WITH STENT PLACEMENT  06/18/2008   CORONARY ANGIOPLASTY WITH STENT PLACEMENT     CORONARY STENT INTERVENTION N/A  09/04/2022   Procedure: CORONARY STENT INTERVENTION;  Surgeon: Alwyn Pea, MD;  Location: [facility] INVASIVE CV LAB;  Service: Cardiovascular;  Laterality: N/A;   LEFT HEART CATH AND CORONARY ANGIOGRAPHY N/A 09/04/2022   Procedure: LEFT HEART CATH AND CORONARY ANGIOGRAPHY;  Surgeon: Alwyn Pea, MD;  Location: [facility] INVASIVE CV LAB;  Service: Cardiovascular;  Laterality: N/A;   RIGHT HEART CATH N/A 04/11/2023   Procedure: RIGHT HEART CATH;  Surgeon: Marcina Millard, MD;  Location: [facility] INVASIVE CV LAB;  Service: Cardiovascular;  Laterality: N/A;   SHOULDER ARTHROSCOPY WITH ROTATOR CUFF REPAIR AND SUBACROMIAL DECOMPRESSION Right 04/19/2017   Procedure: SHOULDER ARTHROSCOPY WITH ROTATOR CUFF REPAIR AND SUBACROMIAL DECOMPRESSION;  Surgeon: Signa Kell, MD;  Location: [facility] ORS;  Service: Orthopedics;  Laterality: Right;  distal clavical excision, bicepts tenotomy     Medications Prior to Admission  Medication Sig Dispense Refill Last Dose/Taking   albuterol (PROVENTIL) (2.5 MG/3ML) 0.083% nebulizer solution Take 2.5 mg by nebulization every 6 (six) hours as needed for wheezing.   04/08/2023   atorvastatin (LIPITOR) 80 MG tablet Take 1 tablet (80 mg total) by mouth daily at 6 PM.   04/08/2023   busPIRone (BUSPAR) 5 MG tablet Take 1 tablet by mouth 2 (two) times daily.   04/08/2023   acetaminophen (TYLENOL) 325 MG tablet Take 2 tablets (650 mg total) by mouth every 6 (six) hours as needed for mild pain (pain score 1-3) (or Fever >/= 101).      acetaminophen (TYLENOL)  650 MG suppository Place 1 suppository (650 mg total) rectally every 6 (six) hours as needed for mild pain (pain score 1-3) (or Fever >/= 101).      aspirin EC 81 MG tablet Take 1 tablet (81 mg total) by mouth daily. Swallow whole.      digoxin (LANOXIN) 0.125 MG tablet Take 1 tablet (0.125 mg total) by mouth daily.      enoxaparin (LOVENOX) 100 MG/ML injection Inject 1 mL (100 mg total) into the skin daily.       guaiFENesin-dextromethorphan (ROBITUSSIN DM) 100-10 MG/5ML syrup Take 5 mLs by mouth every 4 (four) hours as needed for cough.      heparin 57846 UT/250ML infusion Inject 1,500 Units/hr into the vein continuous.      HYDROcodone-acetaminophen (NORCO/VICODIN) 5-325 MG tablet Take 1-2 tablets by mouth every 4 (four) hours as needed for moderate pain (pain score 4-6).      insulin aspart (NOVOLOG) 100 UNIT/ML injection Inject 0-5 Units into the skin at bedtime.      insulin aspart (NOVOLOG) 100 UNIT/ML injection Inject 0-9 Units into the skin 3 (three) times daily with meals.      insulin aspart (NOVOLOG) 100 UNIT/ML injection Inject 3 Units into the skin 3 (three) times daily with meals.      insulin glargine (LANTUS) 100 UNIT/ML injection Inject 0.1 mLs (10 Units total) into the skin daily.      isosorbide mononitrate (IMDUR) 30 MG 24 hr tablet Take 1 tablet (30 mg total) by mouth daily.      losartan (COZAAR) 25 MG tablet Take 0.5 tablets (12.5 mg total) by mouth daily.      melatonin 5 MG TABS Take 0.5 tablets (2.5 mg total) by mouth at bedtime.      metoprolol succinate (TOPROL-XL) 25 MG 24 hr tablet Take 0.5 tablets (12.5 mg total) by mouth daily. (Patient not taking: Reported on 04/12/2023)   Not Taking   mometasone-formoterol (DULERA) 200-5 MCG/ACT AERO Inhale 2 puffs into the lungs 2 (two) times daily.      Morphine Sulfate (MORPHINE, PF,) 2 MG/ML injection Inject 1 mL (2 mg total) into the vein every 2 (two) hours as needed.      nicotine (NICODERM CQ - DOSED IN MG/24 HOURS) 21 mg/24hr patch Place 1 patch (21 mg total) onto the skin daily. (Patient not taking: Reported on 04/09/2023) 28 patch 0    nitroGLYCERIN (NITROSTAT) 0.4 MG SL tablet Place 1 tablet (0.4 mg total) under the tongue every 5 (five) minutes as needed for chest pain.      ondansetron (ZOFRAN) 4 MG tablet Take 1 tablet (4 mg total) by mouth every 6 (six) hours as needed for nausea.      ondansetron (ZOFRAN) 4 MG/2ML SOLN  injection Inject 2 mLs (4 mg total) into the vein every 6 (six) hours as needed for nausea.      pantoprazole (PROTONIX) 40 MG tablet Take 1 tablet (40 mg total) by mouth daily.      spironolactone (ALDACTONE) 25 MG tablet Take 0.5 tablets (12.5 mg total) by mouth daily. (Patient not taking: Reported on 04/12/2023)   Not Taking   warfarin (COUMADIN) 5 MG tablet PHARMACY TO DOSE       Inpatient Medications:   amiodarone  200 mg Oral BID   aspirin EC  81 mg Oral Daily   atorvastatin  80 mg Oral Daily   busPIRone  5 mg Oral BID   Chlorhexidine Gluconate Cloth  6 each  Topical Q0600   digoxin  0.125 mg Oral Daily   insulin aspart  0-5 Units Subcutaneous QHS   insulin aspart  0-9 Units Subcutaneous TID WC   insulin aspart  9 Units Subcutaneous TID WC   insulin glargine-yfgn  25 Units Subcutaneous QHS   melatonin  2.5 mg Oral QHS   multivitamin with minerals  1 tablet Oral Daily   pantoprazole  40 mg Oral Daily   sacubitril-valsartan  1 tablet Oral BID   sodium chloride flush  10-40 mL Intracatheter Q12H   sodium chloride flush  3 mL Intravenous Q12H   spironolactone  25 mg Oral Daily    Allergies: No Known Allergies  Family History  Problem Relation Age of Onset   Heart attack Mother    Heart failure Mother    Coronary artery disease Father    Diabetes Father      Physical Exam: Vitals:   04/21/23 2313 04/22/23 0447 04/22/23 0727 04/22/23 1138  BP: 111/64 106/69 130/78 112/67  Pulse:   98 91  Resp: 19 17 16 16   Temp: 97.8 F (36.6 C) 98.3 F (36.8 C) 98.4 F (36.9 C) 98.5 F (36.9 C)  TempSrc: Oral Oral Oral Oral  SpO2: 99% 98% 95% 93%  Weight:  72 kg    Height:        GEN- pleasant adult male in NAD, A&O x 3, normal affect  HEENT: Normocephalic, atraumatic Lungs- CTAB, Normal effort.  Heart- Regular rate and rhythm, No M/G/R.  GI- Soft, NT, ND.  Extremities- No clubbing, cyanosis, or edema   Radiology/Studies: ECHOCARDIOGRAM LIMITED Result Date: 04/18/2023     ECHOCARDIOGRAM LIMITED REPORT   Patient Name:   Kenneth [name] Date of Exam: 04/18/2023 Medical Rec #:  [id number]    Height:       69.0 in Accession #:    [id number]   Weight:       154.1 lb Date of Birth:  1965/08/08    BSA:          1.849 m Patient Age:    57 years     BP:           139/117 mmHg Patient Gender: M            HR:           78 bpm. Exam Location:  Inpatient Procedure: Limited Echo and Intracardiac Opacification Agent (Both Spectral and            Color Flow Doppler were utilized during procedure). Indications:    LV Mural Thrombus  History:        Patient has prior history of Echocardiogram examinations. CHF                 and NSTEMI, TIA; Risk Factors:Hypertension and Diabetes.  Sonographer:    Lamont Snowball Referring Phys: [phone] LINDSAY NICOLE FINCH IMPRESSIONS  1. Left ventricular ejection fraction, by estimation, is 20 to 25%. The left ventricle has severely decreased function. The left ventricle demonstrates global hypokinesis. FINDINGS  Left Ventricle: Left ventricular ejection fraction, by estimation, is 20 to 25%. The left ventricle has severely decreased function. The left ventricle demonstrates global hypokinesis. Donato Schultz MD Electronically signed by Donato Schultz MD Signature Date/Time: 04/18/2023/3:26:54 PM    Final    MR CARDIAC VELOCITY FLOW MAP Result Date: 04/15/2023 CLINICAL DATA:  Cardiomyopathy of uncertain etiology EXAM: CARDIAC MRI TECHNIQUE: The patient was scanned on a 1.5 Tesla GE magnet. A dedicated cardiac  coil was used. Functional imaging was done using Fiesta sequences. 2,3, and 4 chamber views were done to assess for RWMA's. Modified Simpson's rule using a short axis stack was used to calculate an ejection fraction on a dedicated work [employer]. The patient received 10 cc of Gadavist. After 10 minutes inversion recovery sequences were used to assess for infiltration and scar tissue. FINDINGS: Limited images of the lung fields showed no gross  abnormalities. Small pericardial effusion. Severely dilated left ventricle with normal wall thickness. Global hypokinesis with septal-lateral dyssynchrony consistent with LBBB and inferior akinesis, LV EF 20%. Normal RV size with RV EF 34%. Mildly dilated left atrium, normal right atrium. Tricuspid aortic valve with no stenosis or regurgitation noted. No significant mitral regurgitation noted. On delayed enhancement imaging, there is somewhat subtle mid-wall late gadolinium enhancement (LGE) in the basal to mid septum. >50% wall thickness subendocardial LGE in the basal to mid inferior wall, about 50% wall thickness LGE in the mid inferolateral wall. MEASUREMENTS: MEASUREMENTS LVEDV 335 mL LVEDVi 183 mL/m2 LVSV 66 mL LVEF 20% RVEDV 180 mL RVEDVi 98 mL/m2 RVSV 62 mL RVEF 34% Aortic forward volume 67 mL Aortic regurgitant fraction 1% T1 1117, ECV 35% IMPRESSION: 1. Severely dilated LV. Global hypokinesis with septal-lateral dyssynchrony consistent with LBBB, inferior akinesis also noted. LV EF 20%. 2.  Normal RV size with RV EF 34%. 3. LGE in the basal to mid inferior wall and the mid inferolateral wall suggestive of prior subendocardial MI. There is also subtle mid-wall LGE in the basal to mid septum. This can be nonspecific and seen with dilated cardiomyopathy with volume overload. I do not think that MI-type scar can explain the degree of cardiomyopathy. 4. Mildly elevated extracellular volume percentage suggestive of elevated myocardial fibrotic content (can be explained by area of subendocardial MI). Dalton Mclean Electronically Signed   By: Marca Ancona M.D.   On: 04/15/2023 17:37   MR CARDIAC VELOCITY FLOW MAP Result Date: 04/15/2023 CLINICAL DATA:  Cardiomyopathy of uncertain etiology EXAM: CARDIAC MRI TECHNIQUE: The patient was scanned on a 1.5 Tesla GE magnet. A dedicated cardiac coil was used. Functional imaging was done using Fiesta sequences. 2,3, and 4 chamber views were done to assess for RWMA's.  Modified Simpson's rule using a short axis stack was used to calculate an ejection fraction on a dedicated work [employer] officer, [employer]. The patient received 10 cc of Gadavist. After 10 minutes inversion recovery sequences were used to assess for infiltration and scar tissue. FINDINGS: Limited images of the lung fields showed no gross abnormalities. Small pericardial effusion. Severely dilated left ventricle with normal wall thickness. Global hypokinesis with septal-lateral dyssynchrony consistent with LBBB and inferior akinesis, LV EF 20%. Normal RV size with RV EF 34%. Mildly dilated left atrium, normal right atrium. Tricuspid aortic valve with no stenosis or regurgitation noted. No significant mitral regurgitation noted. On delayed enhancement imaging, there is somewhat subtle mid-wall late gadolinium enhancement (LGE) in the basal to mid septum. >50% wall thickness subendocardial LGE in the basal to mid inferior wall, about 50% wall thickness LGE in the mid inferolateral wall. MEASUREMENTS: MEASUREMENTS LVEDV 335 mL LVEDVi 183 mL/m2 LVSV 66 mL LVEF 20% RVEDV 180 mL RVEDVi 98 mL/m2 RVSV 62 mL RVEF 34% Aortic forward volume 67 mL Aortic regurgitant fraction 1% T1 1117, ECV 35% IMPRESSION: 1. Severely dilated LV. Global hypokinesis with septal-lateral dyssynchrony consistent with LBBB, inferior akinesis also noted. LV EF 20%. 2.  Normal RV size with RV  EF 34%. 3. LGE in the basal to mid inferior wall and the mid inferolateral wall suggestive of prior subendocardial MI. There is also subtle mid-wall LGE in the basal to mid septum. This can be nonspecific and seen with dilated cardiomyopathy with volume overload. I do not think that MI-type scar can explain the degree of cardiomyopathy. 4. Mildly elevated extracellular volume percentage suggestive of elevated myocardial fibrotic content (can be explained by area of subendocardial MI). Dalton Mclean Electronically Signed   By: Marca Ancona M.D.   On:  04/15/2023 17:37   MR CARDIAC MORPHOLOGY W WO CONTRAST Result Date: 04/15/2023 CLINICAL DATA:  Cardiomyopathy of uncertain etiology EXAM: CARDIAC MRI TECHNIQUE: The patient was scanned on a 1.5 Tesla GE magnet. A dedicated cardiac coil was used. Functional imaging was done using Fiesta sequences. 2,3, and 4 chamber views were done to assess for RWMA's. Modified Simpson's rule using a short axis stack was used to calculate an ejection fraction on a dedicated work [employer] officer, [employer]. The patient received 10 cc of Gadavist. After 10 minutes inversion recovery sequences were used to assess for infiltration and scar tissue. FINDINGS: Limited images of the lung fields showed no gross abnormalities. Small pericardial effusion. Severely dilated left ventricle with normal wall thickness. Global hypokinesis with septal-lateral dyssynchrony consistent with LBBB and inferior akinesis, LV EF 20%. Normal RV size with RV EF 34%. Mildly dilated left atrium, normal right atrium. Tricuspid aortic valve with no stenosis or regurgitation noted. No significant mitral regurgitation noted. On delayed enhancement imaging, there is somewhat subtle mid-wall late gadolinium enhancement (LGE) in the basal to mid septum. >50% wall thickness subendocardial LGE in the basal to mid inferior wall, about 50% wall thickness LGE in the mid inferolateral wall. MEASUREMENTS: MEASUREMENTS LVEDV 335 mL LVEDVi 183 mL/m2 LVSV 66 mL LVEF 20% RVEDV 180 mL RVEDVi 98 mL/m2 RVSV 62 mL RVEF 34% Aortic forward volume 67 mL Aortic regurgitant fraction 1% T1 1117, ECV 35% IMPRESSION: 1. Severely dilated LV. Global hypokinesis with septal-lateral dyssynchrony consistent with LBBB, inferior akinesis also noted. LV EF 20%. 2.  Normal RV size with RV EF 34%. 3. LGE in the basal to mid inferior wall and the mid inferolateral wall suggestive of prior subendocardial MI. There is also subtle mid-wall LGE in the basal to mid septum. This can be nonspecific and  seen with dilated cardiomyopathy with volume overload. I do not think that MI-type scar can explain the degree of cardiomyopathy. 4. Mildly elevated extracellular volume percentage suggestive of elevated myocardial fibrotic content (can be explained by area of subendocardial MI). Dalton Mclean Electronically Signed   By: Marca Ancona M.D.   On: 04/15/2023 17:37   VAS US DOPPLER PRE VAD Result Date: 04/14/2023 PERIOPERATIVE VASCULAR EVALUATION Patient Name:  Kenneth [name]  Date of Exam:   04/13/2023 Medical Rec #: [id number]     Accession #:    [id number] Date of Birth: [birth year]     Patient Gender: M Patient Age:   70 years Exam Location:  [facility] Procedure:      VAS US DOPPLER PRE VAD Referring Phys: Reuel Boom BENSIMHON --------------------------------------------------------------------------------  Indications:      Pre VAD workup. HFrEF 10-15% with large LV thrombus Risk Factors:     Hypertension, hyperlipidemia, Diabetes, past history of                   smoking, prior MI, coronary artery disease, prior CVA. 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 Limitations:      arrythmia Comparison Study: Prior normal carotid duplex done at [facility] 12/29/2015 Performing Technologist: Sherren Kerns RVS  Examination Guidelines: A complete evaluation includes B-mode imaging, spectral Doppler, color Doppler, and power Doppler as needed of all accessible portions of each vessel. Bilateral testing is considered an integral part of a complete examination. Limited examinations for reoccurring indications may be performed as noted.  Right Carotid Findings: +----------+--------+--------+--------+------------+------------------+           PSV cm/sEDV cm/sStenosisDescribe    Comments           +----------+--------+--------+--------+------------+------------------+ CCA Prox  87      18                          intimal thickening  +----------+--------+--------+--------+------------+------------------+ CCA Mid   68      24              homogeneous                    +----------+--------+--------+--------+------------+------------------+ CCA Distal80      16                          intimal thickening +----------+--------+--------+--------+------------+------------------+ ICA Prox  49      18              heterogenous                   +----------+--------+--------+--------+------------+------------------+ ICA Mid   76      25                                             +----------+--------+--------+--------+------------+------------------+ ICA Distal90      29                                             +----------+--------+--------+--------+------------+------------------+ ECA       62      18                                             +----------+--------+--------+--------+------------+------------------+ +----------+--------+-------+--------+------------+           PSV cm/sEDV cmsDescribeArm Pressure +----------+--------+-------+--------+------------+ Subclavian51                                  +----------+--------+-------+--------+------------+ +---------+-------+--------+---------------------------------------------------+ VertebralPSV    EDV cm/sappears occluded, history of right vertebral                 cm/s           dissection                                          +---------+-------+--------+---------------------------------------------------+ Left Carotid Findings: +----------+--------+--------+--------+------------+------------------+           PSV cm/sEDV cm/sStenosisDescribe    Comments           +----------+--------+--------+--------+------------+------------------+  CCA Prox  77      13                          intimal thickening +----------+--------+--------+--------+------------+------------------+ CCA Distal71      16                           intimal thickening +----------+--------+--------+--------+------------+------------------+ ICA Prox  84      31              heterogenous                   +----------+--------+--------+--------+------------+------------------+ ICA Mid   91      36                                             +----------+--------+--------+--------+------------+------------------+ ICA Distal85      33                                             +----------+--------+--------+--------+------------+------------------+ ECA       109     11                                             +----------+--------+--------+--------+------------+------------------+ +----------+--------+--------+--------+------------+ SubclavianPSV cm/sEDV cm/sDescribeArm Pressure +----------+--------+--------+--------+------------+           87                      109          +----------+--------+--------+--------+------------+ +---------+--------+--+--------+--+---------+ VertebralPSV cm/s50EDV cm/s17Antegrade +---------+--------+--+--------+--+---------+  ABI Findings: +---------+------------------+-----+-----------+--------------------+ Right    Rt Pressure (mmHg)IndexWaveform   Comment              +---------+------------------+-----+-----------+--------------------+ Brachial                                   Restricted-PICC line +---------+------------------+-----+-----------+--------------------+ PTA      119               1.09 multiphasic                     +---------+------------------+-----+-----------+--------------------+ DP       124               1.14 multiphasic                     +---------+------------------+-----+-----------+--------------------+ Kerby Nora                    Normal                          +---------+------------------+-----+-----------+--------------------+ +---------+------------------+-----+-----------+-------+ Left     Lt Pressure  (mmHg)IndexWaveform   Comment +---------+------------------+-----+-----------+-------+ Brachial 109                    multiphasic        +---------+------------------+-----+-----------+-------+ PTA      116  1.06 multiphasic        +---------+------------------+-----+-----------+-------+ DP       107               0.98 multiphasic        +---------+------------------+-----+-----------+-------+ Great Toe86                     Normal             +---------+------------------+-----+-----------+-------+ +-------+---------------+----------------+ ABI/TBIToday's ABI/TBIPrevious ABI/TBI +-------+---------------+----------------+ Right  1.14/1.07                       +-------+---------------+----------------+ Left   1.06/0.79                       +-------+---------------+----------------+  Summary: Right Carotid: The extracranial vessels were near-normal with only minimal wall                thickening or plaque. Left Carotid: The extracranial vessels were near-normal with only minimal wall               thickening or plaque. Vertebrals:  Left vertebral artery demonstrates antegrade flow. Right vertebral              artery demonstrates an occlusion. Subclavians: Normal flow hemodynamics were seen in bilateral subclavian              arteries.  *See table(s) above for measurements and observations. Right ABI: Resting right ankle-brachial index is within normal range. The right toe-brachial index is normal. Left ABI: Resting left ankle-brachial index is within normal range. The left toe-brachial index is normal.  Electronically signed by Lemar Livings MD on 04/14/2023 at 5:42:38 PM.    Final    VAS [place] LOWER EXTREMITY VENOUS (DVT) Result Date: 04/14/2023  Lower Venous DVT Study Patient Name:  Kenneth [name]  Date of Exam:   04/13/2023 Medical Rec #: [id number]     Accession #:    [id number] Date of Birth: 1965-12-22     Patient Gender: M Patient Age:   66 years Exam  Location:  [facility] Procedure:      VAS [place] LOWER EXTREMITY VENOUS (DVT) Referring Phys: Reuel Boom BENSIMHON --------------------------------------------------------------------------------  Indications: Pre-op VAD workup.  Comparison Study: No prior study on file Performing Technologist: Sherren Kerns RVS  Examination Guidelines: A complete evaluation includes B-mode imaging, spectral Doppler, color Doppler, and power Doppler as needed of all accessible portions of each vessel. Bilateral testing is considered an integral part of a complete examination. Limited examinations for reoccurring indications may be performed as noted. The reflux portion of the exam is performed with the patient in reverse Trendelenburg.  +---------+---------------+---------+-----------+---------------+-------------+ RIGHT    CompressibilityPhasicitySpontaneityProperties     Thrombus                                                                 Aging         +---------+---------------+---------+-----------+---------------+-------------+ CFV      Full           Yes      No         pulsatile  waveform                     +---------+---------------+---------+-----------+---------------+-------------+ SFJ      Full                                                            +---------+---------------+---------+-----------+---------------+-------------+ FV Prox  Full                                                            +---------+---------------+---------+-----------+---------------+-------------+ FV Mid   Full                                                            +---------+---------------+---------+-----------+---------------+-------------+ FV DistalFull           Yes      Yes        pulsatile                                                                waveform                      +---------+---------------+---------+-----------+---------------+-------------+ PFV      Full                                                            +---------+---------------+---------+-----------+---------------+-------------+ POP      Full           Yes      No         pulsatile                                                                waveform                     +---------+---------------+---------+-----------+---------------+-------------+ PTV      Full                                                            +---------+---------------+---------+-----------+---------------+-------------+ PERO     Full                                                            +---------+---------------+---------+-----------+---------------+-------------+  Gastroc  Full                                                            +---------+---------------+---------+-----------+---------------+-------------+   +---------+---------------+---------+-----------+---------------+-------------+ LEFT     CompressibilityPhasicitySpontaneityProperties     Thrombus                                                                 Aging         +---------+---------------+---------+-----------+---------------+-------------+ CFV      Full           Yes      No         pulsatile                                                                waveform                     +---------+---------------+---------+-----------+---------------+-------------+ SFJ      Full                                                            +---------+---------------+---------+-----------+---------------+-------------+ FV Prox  Full                                                            +---------+---------------+---------+-----------+---------------+-------------+ FV Mid   Full                                                             +---------+---------------+---------+-----------+---------------+-------------+ FV DistalFull                                                            +---------+---------------+---------+-----------+---------------+-------------+ PFV      Full                                                            +---------+---------------+---------+-----------+---------------+-------------+ POP  Full           Yes      No         pulsatile                                                                waveform                     +---------+---------------+---------+-----------+---------------+-------------+ PTV      Full                                                            +---------+---------------+---------+-----------+---------------+-------------+ PERO     Full                                                            +---------+---------------+---------+-----------+---------------+-------------+ Gastroc  Full                                                            +---------+---------------+---------+-----------+---------------+-------------+     Summary: RIGHT: - There is no evidence of deep vein thrombosis in the lower extremity.  - No cystic structure found in the popliteal fossa. Pulsatile waveforms  LEFT: - There is no evidence of deep vein thrombosis in the lower extremity.  - No cystic structure found in the popliteal fossa. Pulsatile waveforms.  *See table(s) above for measurements and observations. Electronically signed by Lemar Livings MD on 04/14/2023 at 5:39:18 PM.    Final    CT ABDOMEN PELVIS W CONTRAST Result Date: 04/12/2023 CLINICAL DATA:  Preop for potential LVAD. EXAM: CT ABDOMEN AND PELVIS WITH CONTRAST TECHNIQUE: Multidetector CT imaging of the abdomen and pelvis was performed using the standard protocol following bolus administration of intravenous contrast. RADIATION DOSE REDUCTION: This exam was performed according to the  departmental dose-optimization program which includes automated exposure control, adjustment of the mA and/or kV according to patient size and/or use of iterative reconstruction technique. CONTRAST:  60mL OMNIPAQUE IOHEXOL 350 MG/ML SOLN COMPARISON:  None Available. FINDINGS: Lower chest: The heart is enlarged. Mild septal thickening at the lung bases may represent edema. Trace right pleural effusion. Hepatobiliary: No focal liver abnormality. Liver is normal in size. The gallbladder is nondistended. No calcified gallstone. No biliary dilatation. Pancreas: No ductal dilatation or inflammation. Spleen: Normal in size without focal abnormality. Adrenals/Urinary Tract: Normal adrenal glands. No hydronephrosis or renal calculi. No suspicious renal lesion. Partially distended urinary bladder, mildly thick walled. Stomach/Bowel: Mild wall thickening at the gastroesophageal junction. The stomach is physiologically distended. There is a duodenal diverticulum. Fecalization of distal small bowel contents. The appendix is normal. Moderate colonic stool burden. Left colonic diverticulosis without diverticulitis. Vascular/Lymphatic: Moderate aortic and branch atherosclerosis.  Circumferential noncalcified plaque throughout the abdominal aorta. 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 no enlarged lymph nodes in the abdomen or pelvis. Reproductive: Enlarged prostate gland. Other: Small volume abdominopelvic ascites. No free air. Mild generalized body wall edema. Musculoskeletal: There are no acute or suspicious osseous abnormalities. Lower lumbar facet hypertrophy. IMPRESSION: 1. Cardiomegaly with mild septal thickening at the lung bases, trace right pleural effusion, and small volume abdominopelvic ascites. Findings suggest CHF. 2. Colonic diverticulosis without diverticulitis. 3. Enlarged prostate gland. Mild wall thickening of the urinary bladder, may be due to  chronic outlet obstruction. Electronically Signed   By: Narda Rutherford M.D.   On: 04/12/2023 22:13   DG Orthopantogram Result Date: 04/12/2023 CLINICAL DATA:  [id number] Preoperative evaluation to rule out surgical contraindication [id number] EXAM: ORTHOPANTOGRAM/PANORAMIC COMPARISON:  None Available. FINDINGS: No fracture significant osseous abnormality. No retained radiopaque foreign body. Missing left mandibular molar. Well-aerated partially visualized maxillary sinuses. IMPRESSION: Negative. Electronically Signed   By: Tish Frederickson M.D.   On: 04/12/2023 21:27   [place] EKG SITE RITE Result Date: 04/12/2023 If Site Rite image not attached, placement could not be confirmed due to current cardiac rhythm.  CARDIAC CATHETERIZATION Result Date: 04/11/2023   Hemodynamic findings consistent with moderate pulmonary hypertension. 1.  Moderate pulmonary hypertension, PA mean 36 mmHg 2.  Pulmonary capillary wedge 26 mmHg 3.  Fick cardiac output 3.19   ECHOCARDIOGRAM COMPLETE Result Date: 04/09/2023    ECHOCARDIOGRAM REPORT   Patient Name:   Kenneth [name] Date of Exam: 04/09/2023 Medical Rec #:  [id number]    Height:       69.0 in Accession #:    [id number]   Weight:       168.0 lb Date of Birth:  19-Jun-1965    BSA:          1.918 m Patient Age:    57 years     BP:           100/88 mmHg Patient Gender: M            HR:           100 bpm. Exam Location:  [facility] Procedure: 2D Echo, Cardiac Doppler, Color Doppler, Strain Analysis and 3D Echo            (Both Spectral and Color Flow Doppler were utilized during            procedure). Indications:     CHF-acute systolic I50.21  History:         Patient has prior history of Echocardiogram examinations, most                  recent 09/04/2022. CAD, Stroke; Risk Factors:Diabetes and                  Hypertension.  Sonographer:     Cristela Blue Referring Phys:  [id number] VIP[facility] Diagnosing Phys: Windell Norfolk  Sonographer Comments: Global longitudinal strain was attempted. IMPRESSIONS   1. Large LV thrombus noted. Left ventricular ejection fraction, by estimation, is 10-15%%. The left ventricle has severely decreased function. The left ventricle demonstrates regional wall motion abnormalities (see scoring diagram/findings for description). The left ventricular internal cavity size was severely dilated. Left ventricular diastolic parameters are consistent with Grade II diastolic dysfunction (pseudonormalization).  2. Right ventricular systolic function is mildly reduced. The right ventricular size is mildly enlarged. There is moderately elevated pulmonary artery systolic pressure. The estimated  right ventricular systolic pressure is 45.4 mmHg.  3. Left atrial size was mildly dilated.  4. The mitral valve is normal in structure. Mild mitral valve regurgitation.  5. The aortic valve is tricuspid. There is mild thickening of the aortic valve. Aortic valve regurgitation is trivial. FINDINGS  Left Ventricle: Large LV thrombus noted. Left ventricular ejection fraction, by estimation, is 10-15%%. The left ventricle has severely decreased function. The left ventricle demonstrates regional wall motion abnormalities. The left ventricular internal  cavity size was severely dilated. There is no left ventricular hypertrophy. Left ventricular diastolic parameters are consistent with Grade II diastolic dysfunction (pseudonormalization).  LV Wall Scoring: The mid and distal anterior septum, entire apex, entire inferior wall, and mid inferoseptal segment are akinetic. The anterior wall, antero-lateral wall, posterior wall, basal anteroseptal segment, and basal inferoseptal segment are hypokinetic. Right Ventricle: The right ventricular size is mildly enlarged. No increase in right ventricular wall thickness. Right ventricular systolic function is mildly reduced. There is moderately elevated pulmonary artery systolic pressure. The tricuspid regurgitant velocity is 3.18 m/s, and with an assumed right atrial pressure  of 5 mmHg, the estimated right ventricular systolic pressure is 45.4 mmHg. Left Atrium: Left atrial size was mildly dilated. Right Atrium: Right atrial size was normal in size. Pericardium: There is no evidence of pericardial effusion. Mitral Valve: The mitral valve is normal in structure. Mild mitral valve regurgitation. Tricuspid Valve: The tricuspid valve is normal in structure. Tricuspid valve regurgitation is mild. Aortic Valve: The aortic valve is tricuspid. There is mild thickening of the aortic valve. Aortic valve regurgitation is trivial. Aortic valve mean gradient measures 1.0 mmHg. Aortic valve peak gradient measures 1.5 mmHg. Aortic valve area, by VTI measures 3.71 cm. Pulmonic Valve: The pulmonic valve was not well visualized. Pulmonic valve regurgitation is trivial. Aorta: The aortic root is normal in size and structure. IAS/Shunts: The interatrial septum was not well visualized.  LEFT VENTRICLE PLAX 2D LVIDd:         6.70 cm      Diastology LVIDs:         6.50 cm      LV e' medial:    4.68 cm/s LV PW:         1.10 cm      LV E/e' medial:  15.3 LV IVS:        0.80 cm      LV e' lateral:   4.57 cm/s LVOT diam:     2.20 cm      LV E/e' lateral: 15.7 LV SV:         26 LV SV Index:   14 LVOT Area:     3.80 cm  LV Volumes (MOD) LV vol d, MOD A2C: 202.0 ml LV vol d, MOD A4C: 279.0 ml LV vol s, MOD A2C: 189.0 ml LV vol s, MOD A4C: 259.0 ml LV SV MOD A2C:     13.0 ml LV SV MOD A4C:     279.0 ml LV SV MOD BP:      12.8 ml RIGHT VENTRICLE RV Basal diam:  4.60 cm RV Mid diam:    3.20 cm RV S prime:     8.16 cm/s TAPSE (M-mode): 1.3 cm LEFT ATRIUM             Index        RIGHT ATRIUM           Index LA diam:        4.40 cm 2.29  cm/m   RA Area:     16.30 cm LA Vol (A2C):   62.5 ml 32.58 ml/m  RA Volume:   45.60 ml  23.77 ml/m LA Vol (A4C):   36.3 ml 18.92 ml/m LA Biplane Vol: 49.7 ml 25.91 ml/m  AORTIC VALVE AV Area (Vmax):    3.38 cm AV Area (Vmean):   3.23 cm AV Area (VTI):     3.71 cm AV Vmax:            61.00 cm/s AV Vmean:          39.800 cm/s AV VTI:            0.071 m AV Peak Grad:      1.5 mmHg AV Mean Grad:      1.0 mmHg LVOT Vmax:         54.30 cm/s LVOT Vmean:        33.800 cm/s LVOT VTI:          0.070 m LVOT/AV VTI ratio: 0.98  AORTA Ao Root diam: 3.30 cm MITRAL VALVE               TRICUSPID VALVE MV Area (PHT): 6.83 cm    TR Peak grad:   40.4 mmHg MV Decel Time: 111 msec    TR Vmax:        318.00 cm/s MV E velocity: 71.80 cm/s MV A velocity: 43.50 cm/s  SHUNTS MV E/A ratio:  1.65        Systemic VTI:  0.07 m                            Systemic Diam: 2.20 cm Windell Norfolk Electronically signed by Windell Norfolk Signature Date/Time: 04/09/2023/5:46:27 PM    Final    DG Chest Port 1 View Result Date: 04/09/2023 CLINICAL DATA:  Chest pain EXAM: PORTABLE CHEST 1 VIEW COMPARISON:  03/25/2023 FINDINGS: Cardiomegaly. No confluent airspace opacities, effusions or edema. No acute bony abnormality. IMPRESSION: Cardiomegaly.  No active disease. Electronically Signed   By: Charlett Nose M.D.   On: 04/09/2023 01:36   DG Chest 2 View Result Date: 03/25/2023 CLINICAL DATA:  Shortness of breath.  Weakness. EXAM: CHEST - 2 VIEW COMPARISON:  09/03/2022 FINDINGS: The heart size and mediastinal contours are within normal limits. Both lungs are clear. The visualized skeletal structures are unremarkable. IMPRESSION: No active cardiopulmonary disease. Electronically Signed   By: Danae Orleans M.D.   On: 03/25/2023 13:28    EKG:04/09/23 ST with IVCD / LBBB, PVC's (personally reviewed)  TELEMETRY: SR 70- ST 110 bpm (personally reviewed)  DEVICE HISTORY: n/a  Assessment/Plan:  LBBB  HFrEF  LVEF 10-15%, (down from 20-25% in 08/2022) severely elevated left sided filling pressures & low CI on RHC 04/11/23. NYHA IV symptoms.  -currently on > Toprol, spironolactone, Imdur, losartan, digoxin -QRS 140-160 ms -he has been on ACE-I and beta blocker  -based on guidelines, he would qualify for CRT-D for resynchronization   -plan for NPO after MN on 4/3 for CRT-D placement      For questions or updates, please contact [facility] Please consult [email or web address] for contact info under Cardiology/STEMI.  Signed, Canary Brim, NP-C, AGACNP-BC [facility] - Electrophysiology  04/22/2023, 2:28 PM

## 2023-04-22 NOTE — Progress Notes (Signed)
 On Saturday 04-20-2023, CSW received call from patient's sister Kenneth [name]. She shared that she met with the agent from healthcare marketplace and was informed that patient would not have a "qualifying life event" allowing enrollment in insurance through the marketplace. She explained that she was told that the hospitalization would not qualify because he did not have insurance prior to the admission. Kenneth [name] shared that she "was deflated" over the news of not qualifying. CSW will share with the team and follow up on Monday when return to the hospital. Lasandra Beech, LCSW, CCSW-MCS [phone]

## 2023-04-22 NOTE — Progress Notes (Signed)
 PHARMACY - ANTICOAGULATION CONSULT NOTE  Pharmacy Consult for heparin infusion Indication: Large LV thrombus   No Known Allergies  Patient Measurements: Height: 5\' 9"  (175.3 cm) Weight: 72 kg (158 lb 11.7 oz) IBW/kg (Calculated) : 70.7 Heparin Dosing Weight: 76.2 kg  Vital Signs: Temp: 98.5 F (36.9 C) (03/31 2309) Temp Source: Oral (03/31 2309) BP: 111/72 (03/31 2309) Pulse Rate: 79 (03/31 2309)  Labs: Recent Labs    04/20/23 0559 04/21/23 0500 04/21/23 1400 04/22/23 0450 04/22/23 1730 04/22/23 2327  HGB 15.5 15.5  --  14.5  --   --   HCT 48.2 48.4  --  45.1  --   --   PLT 372 365  --  351  --   --   HEPARINUNFRC 0.56 0.72*   < > 0.29* 0.47 0.33  CREATININE 1.04 1.09  --  1.13  --   --    < > = values in this interval not displayed.   Estimated Creatinine Clearance: 71.3 mL/min (by C-G formula based on SCr of 1.13 mg/dL).  Medical History: Past Medical History:  Diagnosis Date   Coronary artery disease    Diabetes mellitus without complication (HCC)    GERD (gastroesophageal reflux disease)    Hypertension    Stroke (cerebrum) (HCC)     Medications:  Scheduled:   amiodarone  200 mg Oral BID   aspirin EC  81 mg Oral Daily   atorvastatin  80 mg Oral Daily   busPIRone  5 mg Oral BID   Chlorhexidine Gluconate Cloth  6 each Topical Q0600   digoxin  0.125 mg Oral Daily   insulin aspart  0-5 Units Subcutaneous QHS   insulin aspart  0-9 Units Subcutaneous TID WC   insulin aspart  9 Units Subcutaneous TID WC   insulin glargine-yfgn  25 Units Subcutaneous QHS   melatonin  2.5 mg Oral QHS   multivitamin with minerals  1 tablet Oral Daily   pantoprazole  40 mg Oral Daily   sacubitril-valsartan  1 tablet Oral BID   sodium chloride flush  10-40 mL Intracatheter Q12H   spironolactone  25 mg Oral Daily   Assessment: 58 y.o. male with CAD s/p PCI in 08/2022, hypertension, GERD, type 2 diabetes, hyperlipidemia, and tobacco use who presented with worsening shortness  of breath over several months. Large LV thrombus noted on ECHO. No chronic anticoagulation noted based on dispense record. Pt does not have insurance.   3/31:  confirmatory heparin level 0.33 on 1100 units/hr.  Confirmed with RN heparin running through left PIV, level drawn from PICC. CBC stable.  No issues noted.  Goal of Therapy:  Heparin level 0.3-0.7 units/ml Monitor platelets by anticoagulation protocol: Yes  Plan:  Continue IV heparin at 1100 units/hr Daily heparin level and CBC  Arabella Merles, PharmD. Clinical Pharmacist 04/23/2023 12:00 AM

## 2023-04-23 ENCOUNTER — Telehealth (HOSPITAL_COMMUNITY): Payer: Self-pay

## 2023-04-23 LAB — BASIC METABOLIC PANEL WITH GFR
Anion gap: 12 (ref 5–15)
BUN: 17 mg/dL (ref 6–20)
CO2: 25 mmol/L (ref 22–32)
Calcium: 9.4 mg/dL (ref 8.9–10.3)
Chloride: 100 mmol/L (ref 98–111)
Creatinine, Ser: 1.14 mg/dL (ref 0.61–1.24)
GFR, Estimated: >60 mL/min (ref >60)
Glucose, Bld: 207 mg/dL — ABNORMAL HIGH (ref 70–99)
Potassium: 4.7 mmol/L (ref 3.5–5.1)
Sodium: 137 mmol/L (ref 135–145)

## 2023-04-23 LAB — CBC
HCT: 47.1 % (ref 39.0–52.0)
Hemoglobin: 15.1 g/dL (ref 13.0–17.0)
MCH: 28.6 pg (ref 26.0–34.0)
MCHC: 32.1 g/dL (ref 30.0–36.0)
MCV: 89.2 fL (ref 80.0–100.0)
Platelets: 347 10*3/uL (ref 150–400)
RBC: 5.28 MIL/uL (ref 4.22–5.81)
RDW: 15.1 % (ref 11.5–15.5)
WBC: 8.1 10*3/uL (ref 4.0–10.5)
nRBC: 0 % (ref 0.0–0.2)

## 2023-04-23 LAB — COOXEMETRY PANEL
Carboxyhemoglobin: 1.2 % (ref 0.5–1.5)
Carboxyhemoglobin: 1.9 % — ABNORMAL HIGH (ref 0.5–1.5)
Methemoglobin: <0.7 % (ref 0.0–1.5)
Methemoglobin: <0.7 % (ref 0.0–1.5)
O2 Saturation: 58.6 %
O2 Saturation: 73.2 %
Total hemoglobin: 15.8 g/dL (ref 12.0–16.0)
Total hemoglobin: 15.3 g/dL (ref 12.0–16.0)

## 2023-04-23 LAB — HEPARIN LEVEL (UNFRACTIONATED): Heparin Unfractionated: 0.38 [IU]/mL (ref 0.30–0.70)

## 2023-04-23 MED ORDER — INSULIN ASPART 100 UNIT/ML IJ SOLN
11.0000 [IU] | Freq: Three times a day (TID) | INTRAMUSCULAR | Status: DC
  Administered 2023-04-23 – 2023-04-25 (×7): 11 [IU] via SUBCUTANEOUS

## 2023-04-23 NOTE — Progress Notes (Signed)
   Palliative Medicine Inpatient Follow Up Note HPI: Kenneth [name] is a 58 y.o. male with a PMH of CAD with PCI to the RCA and OM, chronic heart failure with reduced ejection fraction, diabetes, hyperlipidemia, hypertension, prior CVA, tobacco use who was admitted to [facility] w/ acute on chronic systolic heart failure w/ NYHA IV symptoms. Has had progressive decline in functional status/exercise tolerance over the last year. The PMT has been asked to support LVAD evaluation.   Today's Discussion 04/23/2023  *Please note that this is a verbal dictation therefore any spelling or grammatical errors are due to the "Dragon [employer] One" system interpretation.  Chart reviewed inclusive of vital signs, progress notes, laboratory results, and diagnostic images.   I met with Zelma this morning in the presence of his spouse and best friend. He shares that to date he still does not have insurance. He and his family continue to work on this though in the meanwhile [name] has agreed to getting CRT-D with the hope of this working as a bridge until open enrollment starts or alternatively until he can obtain insurance (possibly sooner).   Offered time and presence for [name] to discuss his concerns for the future. He shares that he is not ready to die and wants to continue living for his grandchildren.   Questions and concerns addressed.  Objective Assessment: Vital Signs Vitals:   04/23/23 0741 04/23/23 0955  BP: 107/69   Pulse: 81 77  Resp: 18   Temp:    SpO2: 98%     Intake/Output Summary (Last 24 hours) at 04/23/2023 1113 Last data filed at 04/23/2023 0725 Gross per 24 hour  Intake 840 ml  Output 2630 ml  Net -1790 ml   Last Weight  Most recent update: 04/23/2023  5:18 AM    Weight  72.4 kg (159 lb 9.6 oz)            Gen: Middle aged Caucasian M in NAD HEENT: moist mucous membranes CV: Regular rate and rhythm  PULM:  On RA, breathing is even and nonlabored ABD: soft/nontender  EXT: No edema   Neuro: Alert and oriented x3    SUMMARY OF RECOMMENDATIONS   Full Code / Full Scope of Care  Has not been approved for insurance, patients family searching for insurance though all requests have been denied to date  Patient is agreeable to CRT-D   Ongoing support  MDM: Moderate  ______________________________________________________________________________________ [name] [place] Palliative Medicine Team Team Cell Phone: [phone] Please utilize secure chat with additional questions, if there is no response within 30 minutes please call the above phone number  Palliative Medicine Team providers are available by phone from 7am to 7pm daily and can be reached through the team cell phone.  Should this patient require assistance outside of these hours, please call the patient's attending physician.

## 2023-04-23 NOTE — Progress Notes (Signed)
 PHARMACY - ANTICOAGULATION CONSULT NOTE  Pharmacy Consult for heparin infusion Indication: Large LV thrombus   No Known Allergies  Patient Measurements: Height: 5\' 9"  (175.3 cm) Weight: 72.4 kg (159 lb 9.6 oz) IBW/kg (Calculated) : 70.7 Heparin Dosing Weight: 76.2 kg  Vital Signs: Temp: 97.6 F (36.4 C) (04/01 0722) Temp Source: Oral (04/01 0722) BP: 107/69 (04/01 0741) Pulse Rate: 77 (04/01 0955)  Labs: Recent Labs    04/21/23 0500 04/21/23 1400 04/22/23 0450 04/22/23 1730 04/22/23 2327 04/23/23 0420  HGB 15.5  --  14.5  --   --  15.1  HCT 48.4  --  45.1  --   --  47.1  PLT 365  --  351  --   --  347  HEPARINUNFRC 0.72*   < > 0.29* 0.47 0.33 0.38  CREATININE 1.09  --  1.13  --   --  1.14   < > = values in this interval not displayed.   Estimated Creatinine Clearance: 70.6 mL/min (by C-G formula based on SCr of 1.14 mg/dL).  Medical History: Past Medical History:  Diagnosis Date   Coronary artery disease    Diabetes mellitus without complication (HCC)    GERD (gastroesophageal reflux disease)    Hypertension    Stroke (cerebrum) (HCC)     Medications:  Scheduled:   amiodarone  200 mg Oral BID   aspirin EC  81 mg Oral Daily   atorvastatin  80 mg Oral Daily   busPIRone  5 mg Oral BID   Chlorhexidine Gluconate Cloth  6 each Topical Q0600   digoxin  0.125 mg Oral Daily   insulin aspart  0-5 Units Subcutaneous QHS   insulin aspart  0-9 Units Subcutaneous TID WC   insulin aspart  9 Units Subcutaneous TID WC   insulin glargine-yfgn  25 Units Subcutaneous QHS   melatonin  2.5 mg Oral QHS   multivitamin with minerals  1 tablet Oral Daily   pantoprazole  40 mg Oral Daily   sacubitril-valsartan  1 tablet Oral BID   sodium chloride flush  10-40 mL Intracatheter Q12H   spironolactone  25 mg Oral Daily   Assessment: 58 y.o. male with CAD s/p PCI in 08/2022, hypertension, GERD, type 2 diabetes, hyperlipidemia, and tobacco use who presented with worsening shortness  of breath over several months. Large LV thrombus noted on ECHO. No chronic anticoagulation noted based on dispense record. Pt does not have insurance.   Heparin level this morning remains within goal range at 0.38.  No overt bleeding or complications noted.  Remains on heparin while awaiting ICD Thursday.  Plans for long term anticoagulation are unclear.  Goal of Therapy:  Heparin level 0.3-0.7 units/ml Monitor platelets by anticoagulation protocol: Yes  Plan:  Continue IV heparin at 1100 units/hr Daily heparin level and CBC  Reece Leader, Loura Back, BCPS, [facility] Clinical Pharmacist  04/23/2023 10:18 AM   [facility] pharmacy phone numbers are listed on [email or web address]

## 2023-04-23 NOTE — Progress Notes (Signed)
 Advanced Heart Failure Rounding Note  Subjective:    Remains on 0.25 milrinone with CO-OX 58.6 and Fick CI 1.5 L/min/m2. Repeat pending.  Feeling well. No dyspnea. Has been walking the halls and going outside regularly. Eager to go home.    Objective:   Echo LVEF 10-15%, severely dilated LV (6.7 LVIDD) w/ large LV thrombus. RV mildly reduced.   LHC 8/24:  LM ok LAD minor irregs LCx 100% large OM1 -> PCI  RCA CTO prox RHC 04/11/23 RA 8 PA 53/26 (36) PW 26 PVR 3.1 Fick 3.2/1.7 PAPi 2.1  Vital Signs:   Temp:  [97.6 F (36.4 C)-98.5 F (36.9 C)] 97.6 F (36.4 C) (04/01 0722) Pulse Rate:  [75-91] 81 (04/01 0741) Resp:  [15-18] 18 (04/01 0741) BP: (101-134)/(67-91) 107/69 (04/01 0741) SpO2:  [93 %-98 %] 98 % (04/01 0741) Weight:  [72.4 kg] 72.4 kg (04/01 0500) Last BM Date : 04/22/23  Weight change: Filed Weights   04/21/23 0422 04/22/23 0447 04/23/23 0500  Weight: 71.7 kg 72 kg 72.4 kg   Intake/Output:   Intake/Output Summary (Last 24 hours) at 04/23/2023 0902 Last data filed at 04/23/2023 0725 Gross per 24 hour  Intake 840 ml  Output 2630 ml  Net -1790 ml     Physical Exam: General:  Well appearing. Sitting up on side of bed. Friend present. Neck: JVP 7-8 cm Cor: Regular rate & rhythm. No rubs, gallops or murmurs. Lungs: clear Abdomen: soft, nontender, nondistended.  Extremities: no edema, RUE PICC Neuro: alert & orientedx3. Affect pleasant    Labs: Basic Metabolic Panel: Recent Labs  Lab 04/17/23 0435 04/18/23 0612 04/19/23 0552 04/20/23 0559 04/21/23 0500 04/22/23 0450 04/22/23 0500 04/23/23 0420  NA 133*   < > 135 138 138 136  --  137  K 4.0   < > 4.6 4.3 4.4 4.5  --  4.7  CL 99   < > 100 103 105 101  --  100  CO2 26   < > 26 27 25 27   --  25  GLUCOSE 313*   < > 220* 162* 118* 264*  --  207*  BUN 30*   < > 31* 21* 18 14  --  17  CREATININE 1.16   < > 1.25* 1.04 1.09 1.13  --  1.14  CALCIUM 8.7*   < > 9.3 9.3 9.4 8.8*  --  9.4  MG 1.7  --  1.9   --  1.9  --  2.1  --    < > = values in this interval not displayed.    Liver Function Tests: No results for input(s): "AST", "ALT", "ALKPHOS", "BILITOT", "PROT", "ALBUMIN" in the last 168 hours.  No results for input(s): "LIPASE", "AMYLASE" in the last 168 hours. No results for input(s): "AMMONIA" in the last 168 hours.  CBC: Recent Labs  Lab 04/19/23 0552 04/20/23 0559 04/21/23 0500 04/22/23 0450 04/23/23 0420  WBC 8.3 9.1 9.7 8.5 8.1  HGB 15.9 15.5 15.5 14.5 15.1  HCT 48.9 48.2 48.4 45.1 47.1  MCV 89.6 89.3 89.8 89.0 89.2  PLT 349 372 365 351 347    Cardiac Enzymes: No results for input(s): "CKTOTAL", "CKMB", "CKMBINDEX", "TROPONINI" in the last 168 hours.  BNP: BNP (last 3 results) Recent Labs    02/18/23 1348 04/09/23 0121  BNP 697.3* 1,185.6*   ProBNP (last 3 results) No results for input(s): "PROBNP" in the last 8760 hours.  Other results:  Medications:   Scheduled Medications:  amiodarone  200 mg Oral BID   aspirin EC  81 mg Oral Daily   atorvastatin  80 mg Oral Daily   busPIRone  5 mg Oral BID   Chlorhexidine Gluconate Cloth  6 each Topical Q0600   digoxin  0.125 mg Oral Daily   insulin aspart  0-5 Units Subcutaneous QHS   insulin aspart  0-9 Units Subcutaneous TID WC   insulin aspart  9 Units Subcutaneous TID WC   insulin glargine-yfgn  25 Units Subcutaneous QHS   melatonin  2.5 mg Oral QHS   multivitamin with minerals  1 tablet Oral Daily   pantoprazole  40 mg Oral Daily   sacubitril-valsartan  1 tablet Oral BID   sodium chloride flush  10-40 mL Intracatheter Q12H   spironolactone  25 mg Oral Daily    Infusions:  heparin 1,100 Units/hr (04/22/23 1020)   milrinone 0.25 mcg/kg/min (04/22/23 1520)    PRN Medications:   Assessment/Plan:  1. Acute on chronic systolic CHF: ?Primarily ischemic cardiomyopathy.  Echo 8/24 at time of last NSTEMI with EF 25-30%.  Echo this admission with EF 10-15%, severe LV dilation with LV thrombus, mild RV  dysfunction.  His cardiomyopathy does seem out of proportion to his coronary disease.  He does not drink ETOH or use drugs.  No family history of dilated cardiomyopathy (FH of CAD).  He does have uncontrolled DM as well as a chronic wide LBBB (176 msec) which could contribute.   PCWP elevated on RHC, CI low at 1.67.   - Remains on 0.25 milrinone. CO-OX 58.6% with estimated Fick CI of 1.5. Recheck.  - Volume appears stable. - Continue digoxin 0.125 daily.  - Continue entresto 24/26mg  BID - Continue spironolactone 25 daily.  - Consider SGLT2i following device implant later this week (will check dost with PharmD). - Hold Toprol XL with low output.  - PFTs okay - Insurance major barrier to VAD implant at this point.  - Suspect his LBBB is playing a part in his heart failure. Without insurance may be able to bridge him with CRT-D until open enrollment. HF CSW looking into options to see if we could potentially get him home with milrinone and try to wean over the next couple of months.   2. LV thrombus: continue heparin. Appears to have resolved on recent cardiac MRI, not seen on most recent echocardiogram 3/27.  3. PVCs/NSVT: Patient noted to have frequent PVCs on telemetry.   - With addition of milrinone, now on amiodarone 200 mg po bid.  - NSVT stable  4. CAD: Remote PCI to RCA, cath in 8/24 showed chronically occluded proximal RCA.  NSTEMI in 8/24 with occluded OM1 treated with DES.  LAD with minimal disease.  No chest pain currently.  HS-TnI not significantly elevated, no ACS this admission.  - He is now off Plavix (> 6 months post PCI).  - Continue ASA 81.  - Continue statin.   5. DM2: Poor control, hgbA1c 12>>11.6.   - Diabetes coordinator following - On standing insulin now + SSI.  - TOC clinic helping with discharge plan for insulin  6. Smoking: He has quit. -Last cigarette in January 2025  Unfortunately was denied insurance due to lack of a "qualifying life event". Planning to  place CRT-D and possible home inotrope in effort to try to bridge him to open enrollment in December.   Length of Stay: 11   Kenneth [name], AGACNP-BC 04/23/2023, 9:02 AM  Advanced Heart Failure Team Pager [phone] (M-F; 7a -  4p)  Please contact [facility] for night-coverage after hours (4p -7a ) and weekends on [email or web address]

## 2023-04-23 NOTE — Progress Notes (Signed)
 Outpatient LVAD CSW received call from patient's sister Cordelia Pen stating that she submitted an expedited appeal with the [employer] yesterday and they returned call today to share that his case was in process with the appeal. Cordelia Pen shared that she was told it might take 5-7 days for a decision on the appeal. She shared how grateful she is to the team for continuing to support her brother and discuss options. Outpatient LVAD CSW will forward this information to Dr Elwyn Lade. CSW available if needed. Lasandra Beech, LCSW, CCSW-MCS [phone]

## 2023-04-23 NOTE — Progress Notes (Cosign Needed Addendum)
  Patient Name: Kenneth [name] Date of Encounter: 04/23/2023  Primary Cardiologist: None Electrophysiologist: None  Interval Summary   The patient is anxious to get his procedure completed, has concerns about being told that we are planned for Thursday & does not want it to get changed.   At this time, the patient denies chest pain, shortness of breath, or any new concerns.  Vital Signs    Vitals:   04/22/23 2309 04/23/23 0500 04/23/23 0722 04/23/23 0741  BP: 111/72 108/67 (!) 134/91 107/69  Pulse: 79 84 75 81  Resp:  15 18 18   Temp: 98.5 F (36.9 C) 97.7 F (36.5 C) 97.6 F (36.4 C)   TempSrc: Oral Oral Oral   SpO2: 96% 97% 93% 98%  Weight:  72.4 kg    Height:        Intake/Output Summary (Last 24 hours) at 04/23/2023 0820 Last data filed at 04/23/2023 0725 Gross per 24 hour  Intake 1080 ml  Output 2630 ml  Net -1550 ml   Filed Weights   04/21/23 0422 04/22/23 0447 04/23/23 0500  Weight: 71.7 kg 72 kg 72.4 kg    Physical Exam    GEN- chronically ill appearing adult male, lying in bed in NAD, alert and oriented x 3 today.   Lungs- Clear to ausculation bilaterally, normal work of breathing Cardiac- Regular rate and rhythm, no murmurs, rubs or gallops GI- soft, NT, ND, + BS Extremities- no clubbing or cyanosis. No edema  Telemetry    SR 60-ST 105 bpm, LBBB, occ PVC's / NSVT (personally reviewed)  Hospital Course    Kenneth [name] is a 58 y.o. male with a history of CAD s/p PCI to RCA/OM, HFrEF, HTN, HLD, CVA, DM, tobacco abuse (quit 02/2023) admitted on 04/12/23 with acute on chronic systolic HF.  Not an LVAD candidate.  Pending CRT-D.   Assessment & Plan    LBBB  HFrEF  LVEF 10-15%, (down from 20-25% in 08/2022) severely elevated left sided filling pressures & low CI on RHC 04/11/23. NYHA IV symptoms. QRS > 150 ms. Felt that global EF depression is out of proportion to scar distribution   -continue losartan, toprol, imdur, digoxin  -has been on GDMT with up titration  for the last 3 months  -NPO after MN on 4/3 for CRT-D insertion  -heparin infusion for now > will determine hold time with MD (MN vs 0600) -reviewed anticoagulation plan with Advanced HF team > ok to begin coumadin post implant and allow INR to drift up given resolution of thrombus on ECHO  LV Thrombus  -resolved, continue heparin infusion for now   For questions or updates, please contact [facility] Please consult [email or web address] for contact info under Cardiology/STEMI.  Signed, Canary Brim, NP-C, AGACNP-BC [facility] - Electrophysiology  04/23/2023, 8:20 AM

## 2023-04-23 NOTE — Plan of Care (Signed)
  Problem: Education: Goal: Knowledge of General Education information will improve Description: Including pain rating scale, medication(s)/side effects and non-pharmacologic comfort measures Outcome: Progressing   Problem: Health Behavior/Discharge Planning: Goal: Ability to manage health-related needs will improve Outcome: Progressing   Problem: Clinical Measurements: Goal: Will remain free from infection Outcome: Progressing   Problem: Nutrition: Goal: Adequate nutrition will be maintained Outcome: Progressing   Problem: Pain Managment: Goal: General experience of comfort will improve and/or be controlled Outcome: Progressing   Problem: Safety: Goal: Ability to remain free from injury will improve Outcome: Progressing   Problem: Coping: Goal: Ability to adjust to condition or change in health will improve Outcome: Progressing   Problem: Health Behavior/Discharge Planning: Goal: Ability to manage health-related needs will improve Outcome: Progressing   Problem: Nutritional: Goal: Maintenance of adequate nutrition will improve Outcome: Progressing   Problem: Skin Integrity: Goal: Risk for impaired skin integrity will decrease Outcome: Progressing

## 2023-04-23 NOTE — Progress Notes (Signed)
 Nutrition Follow-up  DOCUMENTATION CODES:   Non-severe (moderate) malnutrition in context of chronic illness  INTERVENTION:  Multivitamin w/ minerals daily Double protein with all meals w/ manager check  Liberalize to regular diet w/ fluid restriction Continue QHS snack of [place] sandwich on whole wheat  NUTRITION DIAGNOSIS:  Moderate Malnutrition related to chronic illness as evidenced by mild fat depletion, mild muscle depletion, moderate muscle depletion. - remains applicable  GOAL:  Patient will meet greater than or equal to 90% of their needs - progressing/meeting  MONITOR:  PO intake, Labs, Weight trends, Supplement acceptance  REASON FOR ASSESSMENT:  Consult Assessment of nutrition requirement/status  ASSESSMENT:  58 y.o. male presented to the ED with L sided chest pain. PMH includes T2DM, tobacco use, NSTEMI, CVA, HTN, HLD, and CAD. Pt admitted with acute on chronic CHF.  3/18 - Admitted to [facility] 3/21 - transferred to [facility] for LVAD evaluation 3/27 - ECHO: LV thrombus resolved  Pt has been unable to acquire insurance to cover LVAD procedure. He is now being worked up for CRT-D placement to bridge the gap until insurance coverage can be obtained. Procedure tentatively scheduled for 04/25/2023. HF CSW also working toward possible home inotrope therapy in an effort to wean over the next few months and make it to open enrollment.   Average Meal Intake 3/29: 50-100% x2 documented meals 3/31: 50% x1; 100% x2 documented meals 4/1: 100% x1 documented meal  Intake remains desirable. Between snack offerings, pt snacks at bedside, and double portion proteins, pt is likely consistently meeting estimated calorie and protein needs. Continue to focus on optimizing nutrition status prior to surgery to promote desirable recovery and minimal loss of lean body mass.   Admit Weight: 72.9kg Current Weight: 72.4kg Lowest Weight: 67.6kg on 3/23  No edema noted. Skin intact. Bowels  stable. Wt stable since admission.   Blood sugars remain elevated, but trending down. Diabetes coordinator engaged. Most recent recommendation: increasing Novolog to 11 units TID. Messaged heart failure PA-C regarding this recommendation.  Meds: SSI 0-5 QHS, SSI 0-9 TID, SSI 9 units TID, semglee 25 QHS, melatonin, MVI, pantoprazole, spironolactone Drips: Milrinone @ 5.19mL/hr  Labs:  CBGs 207-264 x24 hours A1c 11.6 (04/22/2023)  Diet Order:   Diet Order             Diet regular Room service appropriate? Yes; Fluid consistency: Thin  Diet effective now             EDUCATION NEEDS:  Education needs have been addressed  Skin:  Skin Assessment: Reviewed RN Assessment  Last BM:  3/31  Height:  Ht Readings from Last 1 Encounters:  04/12/23 5\' 9"  (1.753 m)    Weight:  Wt Readings from Last 1 Encounters:  04/23/23 72.4 kg   Ideal Body Weight:  72.7 kg  BMI:  Body mass index is 23.57 kg/m.  Estimated Nutritional Needs:   Kcal:  2100-2300  Protein:  105-125 grams  Fluid:  >/= 1.5 L  Myrtie Cruise MS, RD, LDN Registered Dietitian Clinical Nutrition RD Inpatient Contact Info in Amion

## 2023-04-23 NOTE — Plan of Care (Signed)

## 2023-04-23 NOTE — Telephone Encounter (Signed)
 Advanced Heart Failure Patient Advocate Encounter  Application for Entresto faxed to [employer] on 04/23/2023. Application form attached to patient chart.  Kenneth [name], CPhT Rx Patient Advocate Phone: [phone]

## 2023-04-24 LAB — CBC
HCT: 46 % (ref 39.0–52.0)
Hemoglobin: 15 g/dL (ref 13.0–17.0)
MCH: 29.1 pg (ref 26.0–34.0)
MCHC: 32.6 g/dL (ref 30.0–36.0)
MCV: 89.3 fL (ref 80.0–100.0)
Platelets: 365 10*3/uL (ref 150–400)
RBC: 5.15 MIL/uL (ref 4.22–5.81)
RDW: 15 % (ref 11.5–15.5)
WBC: 11.5 10*3/uL — ABNORMAL HIGH (ref 4.0–10.5)
nRBC: 0 % (ref 0.0–0.2)

## 2023-04-24 LAB — BASIC METABOLIC PANEL WITH GFR
Anion gap: 14 (ref 5–15)
BUN: 23 mg/dL — ABNORMAL HIGH (ref 6–20)
CO2: 22 mmol/L (ref 22–32)
Calcium: 9.2 mg/dL (ref 8.9–10.3)
Chloride: 99 mmol/L (ref 98–111)
Creatinine, Ser: 1.21 mg/dL (ref 0.61–1.24)
GFR, Estimated: >60 mL/min (ref >60)
Glucose, Bld: 244 mg/dL — ABNORMAL HIGH (ref 70–99)
Potassium: 4.3 mmol/L (ref 3.5–5.1)
Sodium: 135 mmol/L (ref 135–145)

## 2023-04-24 LAB — HEPARIN LEVEL (UNFRACTIONATED): Heparin Unfractionated: 0.47 [IU]/mL (ref 0.30–0.70)

## 2023-04-24 LAB — COOXEMETRY PANEL
Carboxyhemoglobin: 1.4 % (ref 0.5–1.5)
Methemoglobin: 0.8 % (ref 0.0–1.5)
O2 Saturation: 71.5 %
Total hemoglobin: 15.4 g/dL (ref 12.0–16.0)

## 2023-04-24 LAB — GLUCOSE, CAPILLARY
Glucose-Capillary: 293 mg/dL — ABNORMAL HIGH (ref 70–99)
Glucose-Capillary: 38 mg/dL — CL (ref 70–99)
Glucose-Capillary: 107 mg/dL — ABNORMAL HIGH (ref 70–99)

## 2023-04-24 MED ORDER — CHLORHEXIDINE GLUCONATE 4 % EX SOLN: 60.0000 mL | Freq: Once | CUTANEOUS | Status: DC

## 2023-04-24 MED ORDER — CEFAZOLIN SODIUM-DEXTROSE 2-4 GM/100ML-% IV SOLN: 2.0000 g | INTRAVENOUS | Status: DC

## 2023-04-24 MED ORDER — SODIUM CHLORIDE 0.9 % IV SOLN: INTRAVENOUS | Status: DC

## 2023-04-24 MED ORDER — SODIUM CHLORIDE 0.9% FLUSH: 3.0000 mL | Freq: Two times a day (BID) | INTRAVENOUS | Status: DC

## 2023-04-24 MED ORDER — SODIUM CHLORIDE 0.9% FLUSH
3.0000 mL | INTRAVENOUS | Status: DC | PRN
Start: 2023-04-24 — End: 2023-04-24

## 2023-04-24 MED ORDER — SODIUM CHLORIDE 0.9 % IV SOLN: 80.0000 mg | INTRAVENOUS | Status: DC

## 2023-04-24 MED ORDER — HEPARIN (PORCINE) 25000 UT/250ML-% IV SOLN
1100.0000 [IU]/h | INTRAVENOUS | Status: DC
  Administered 2023-04-24: 1100 [IU]/h via INTRAVENOUS
  Filled 2023-04-24: qty 250

## 2023-04-24 NOTE — Progress Notes (Addendum)
 Patient pending appeal for LVAD.  CRT-D canceled in light of ongoing advanced therapy support work up.  If he is unable to have LVAD placed, please notify EP for device review.  EP will be available PRN.  Please call back if new needs arise.    Canary Brim, NP-C, AGACNP-BC [facility] - Electrophysiology  04/24/2023, 6:52 AM

## 2023-04-24 NOTE — Plan of Care (Signed)
  Problem: Education: Goal: Knowledge of General Education information will improve Description: Including pain rating scale, medication(s)/side effects and non-pharmacologic comfort measures Outcome: Progressing   Problem: Health Behavior/Discharge Planning: Goal: Ability to manage health-related needs will improve Outcome: Progressing   Problem: Clinical Measurements: Goal: Ability to maintain clinical measurements within normal limits will improve Outcome: Progressing   Problem: Activity: Goal: Risk for activity intolerance will decrease Outcome: Progressing   Problem: Nutrition: Goal: Adequate nutrition will be maintained Outcome: Progressing   Problem: Elimination: Goal: Will not experience complications related to bowel motility Outcome: Progressing Goal: Will not experience complications related to urinary retention Outcome: Progressing   Problem: Pain Managment: Goal: General experience of comfort will improve and/or be controlled Outcome: Progressing   Problem: Safety: Goal: Ability to remain free from injury will improve Outcome: Progressing   Problem: Skin Integrity: Goal: Risk for impaired skin integrity will decrease Outcome: Progressing   Problem: Health Behavior/Discharge Planning: Goal: Ability to manage health-related needs will improve Outcome: Progressing   Problem: Nutritional: Goal: Progress toward achieving an optimal weight will improve Outcome: Progressing   Problem: Skin Integrity: Goal: Risk for impaired skin integrity will decrease Outcome: Progressing

## 2023-04-24 NOTE — Plan of Care (Signed)

## 2023-04-24 NOTE — Progress Notes (Signed)
 PHARMACY - ANTICOAGULATION CONSULT NOTE  Pharmacy Consult for heparin infusion Indication: Large LV thrombus   No Known Allergies  Patient Measurements: Height: 5\' 9"  (175.3 cm) Weight: 65.7 kg (159 lb 9.6 oz) IBW/kg (Calculated) : 70.7 Heparin Dosing Weight: 76.2 kg  Vital Signs: Temp: 98 F (36.7 C) (04/02 0814) Temp Source: Oral (04/02 0814) BP: 112/78 (04/02 0814) Pulse Rate: 81 (04/02 1029)  Labs: Recent Labs    04/22/23 0450 04/22/23 1730 04/22/23 2327 04/23/23 0420 04/24/23 0348  HGB 14.5  --   --  15.1 15.0  HCT 45.1  --   --  47.1 46.0  PLT 351  --   --  347 365  HEPARINUNFRC 0.29*   < > 0.33 0.38 0.47  CREATININE 1.13  --   --  1.14 1.21   < > = values in this interval not displayed.   Estimated Creatinine Clearance: 66.5 mL/min (by C-G formula based on SCr of 1.21 mg/dL).  Medical History: Past Medical History:  Diagnosis Date   Coronary artery disease    Diabetes mellitus without complication (HCC)    GERD (gastroesophageal reflux disease)    Hypertension    Stroke (cerebrum) (HCC)     Medications:  Scheduled:   amiodarone  200 mg Oral BID   aspirin EC  81 mg Oral Daily   atorvastatin  80 mg Oral Daily   busPIRone  5 mg Oral BID   Chlorhexidine Gluconate Cloth  6 each Topical Q0600   digoxin  0.125 mg Oral Daily   insulin aspart  0-5 Units Subcutaneous QHS   insulin aspart  0-9 Units Subcutaneous TID WC   insulin aspart  11 Units Subcutaneous TID WC   insulin glargine-yfgn  25 Units Subcutaneous QHS   melatonin  2.5 mg Oral QHS   multivitamin with minerals  1 tablet Oral Daily   pantoprazole  40 mg Oral Daily   sacubitril-valsartan  1 tablet Oral BID   sodium chloride flush  10-40 mL Intracatheter Q12H   spironolactone  25 mg Oral Daily   Assessment: 58 y.o. male with CAD s/p PCI in 08/2022, hypertension, GERD, type 2 diabetes, hyperlipidemia, and tobacco use who presented with worsening shortness of breath over several months. Large LV  thrombus noted on ECHO. No chronic anticoagulation noted based on dispense record. Pt does not have insurance.   Heparin level this morning remains within goal range at 0.47.  No overt bleeding or complications noted.  Remains on heparin while awaiting insurance approval.  Plans for long term anticoagulation are unclear at the moment.  Goal of Therapy:  Heparin level 0.3-0.7 units/ml Monitor platelets by anticoagulation protocol: Yes  Plan:  Continue IV heparin at 1100 units/hr Daily heparin level and CBC  Reece Leader, Loura Back, BCPS, [facility] Clinical Pharmacist  04/24/2023 12:39 PM   [facility] pharmacy phone numbers are listed on [email or web address]

## 2023-04-24 NOTE — Plan of Care (Signed)

## 2023-04-24 NOTE — Inpatient Diabetes Management (Signed)
 Inpatient Diabetes Program Recommendations  AACE/ADA: New Consensus Statement on Inpatient Glycemic Control (2015)  Target Ranges:  Prepandial:   less than 140 mg/dL      Peak postprandial:   less than 180 mg/dL (1-2 hours)      Critically ill patients:  140 - 180 mg/dL   Lab Results  Component Value Date   GLUCAP 293 (H) 04/24/2023   HGBA1C 11.6 (H) 04/22/2023    Review of Glycemic Control  Latest Reference Range & Units 04/24/23 08:12  Glucose-Capillary 70 - 99 mg/dL 161 (H)   Diabetes history: DM 2 Outpatient Diabetes medications: metformin  Current orders for Inpatient glycemic control:  [place] 25 units qhs Novolog 0-9 units tid + hs Novolog 11 units tid meal coverage  Note: Novolog meal coverage increased  Inpatient Diabetes Program Recommendations:    Consider increasing Semglee to 30 units  NURSES: please document CBGs under the "CGM flowsheet" from his continuous glucose monitor so we have record of glucose trends and can more easily dose insulin.  Thanks, Christena Deem RN, MSN, BC-ADM Inpatient Diabetes Coordinator Team Pager [phone] (8a-5p)

## 2023-04-24 NOTE — Progress Notes (Signed)
 Advanced Heart Failure Rounding Note  Subjective:    Remains on 0.25 milrinone with CO-OX 72 and Fick CI 2.45 L/min/m2.   CVP 2  Feels good this morning. Wife and friend at bedside.   Objective:   Echo LVEF 10-15%, severely dilated LV (6.7 LVIDD) w/ large LV thrombus. RV mildly reduced.   LHC 8/24:  LM ok LAD minor irregs LCx 100% large OM1 -> PCI  RCA CTO prox RHC 04/11/23 RA 8 PA 53/26 (36) PW 26 PVR 3.1 Fick 3.2/1.7 PAPi 2.1  Vital Signs:   Temp:  [97.8 F (36.6 C)-98.5 F (36.9 C)] 98 F (36.7 C) (04/02 0345) Pulse Rate:  [67-84] 75 (04/02 0345) Resp:  [19-20] 20 (04/02 0345) BP: (111-116)/(68-74) 116/73 (04/02 0345) SpO2:  [96 %-98 %] 98 % (04/02 0345) Last BM Date : 04/23/23  Weight change: Filed Weights   04/21/23 0422 04/22/23 0447 04/23/23 0500  Weight: 71.7 kg 72 kg 72.4 kg   Intake/Output:   Intake/Output Summary (Last 24 hours) at 04/24/2023 0813 Last data filed at 04/24/2023 0346 Gross per 24 hour  Intake --  Output 2300 ml  Net -2300 ml     Physical Exam: General:  well appearing.  No respiratory difficulty Neck: supple. JVD flat.  Cor: PMI nondisplaced. Regular rate & rhythm. No rubs, gallops or murmurs. Lungs: clear Extremities: no cyanosis, clubbing, rash, edema. PICC RUE Neuro: alert & oriented x 3. Moves all 4 extremities w/o difficulty. Affect pleasant.   Tele: NSR 70s (Personally reviewed)    Labs: Basic Metabolic Panel: Recent Labs  Lab 04/19/23 0552 04/20/23 0559 04/21/23 0500 04/22/23 0450 04/22/23 0500 04/23/23 0420 04/24/23 0348  NA 135 138 138 136  --  137 135  K 4.6 4.3 4.4 4.5  --  4.7 4.3  CL 100 103 105 101  --  100 99  CO2 26 27 25 27   --  25 22  GLUCOSE 220* 162* 118* 264*  --  207* 244*  BUN 31* 21* 18 14  --  17 23*  CREATININE 1.25* 1.04 1.09 1.13  --  1.14 1.21  CALCIUM 9.3 9.3 9.4 8.8*  --  9.4 9.2  MG 1.9  --  1.9  --  2.1  --   --     Liver Function Tests: No results for input(s): "AST", "ALT",  "ALKPHOS", "BILITOT", "PROT", "ALBUMIN" in the last 168 hours.  No results for input(s): "LIPASE", "AMYLASE" in the last 168 hours. No results for input(s): "AMMONIA" in the last 168 hours.  CBC: Recent Labs  Lab 04/20/23 0559 04/21/23 0500 04/22/23 0450 04/23/23 0420 04/24/23 0348  WBC 9.1 9.7 8.5 8.1 11.5*  HGB 15.5 15.5 14.5 15.1 15.0  HCT 48.2 48.4 45.1 47.1 46.0  MCV 89.3 89.8 89.0 89.2 89.3  PLT 372 365 351 347 365    Cardiac Enzymes: No results for input(s): "CKTOTAL", "CKMB", "CKMBINDEX", "TROPONINI" in the last 168 hours.  BNP: BNP (last 3 results) Recent Labs    02/18/23 1348 04/09/23 0121  BNP 697.3* 1,185.6*   ProBNP (last 3 results) No results for input(s): "PROBNP" in the last 8760 hours.  Other results:  Medications:   Scheduled Medications:  amiodarone  200 mg Oral BID   aspirin EC  81 mg Oral Daily   atorvastatin  80 mg Oral Daily   busPIRone  5 mg Oral BID   Chlorhexidine Gluconate Cloth  6 each Topical Q0600   digoxin  0.125 mg Oral Daily  insulin aspart  0-5 Units Subcutaneous QHS   insulin aspart  0-9 Units Subcutaneous TID WC   insulin aspart  11 Units Subcutaneous TID WC   insulin glargine-yfgn  25 Units Subcutaneous QHS   melatonin  2.5 mg Oral QHS   multivitamin with minerals  1 tablet Oral Daily   pantoprazole  40 mg Oral Daily   sacubitril-valsartan  1 tablet Oral BID   sodium chloride flush  10-40 mL Intracatheter Q12H   spironolactone  25 mg Oral Daily    Infusions:  heparin 1,100 Units/hr (04/23/23 1424)   milrinone 0.25 mcg/kg/min (04/23/23 2202)    PRN Medications:   Assessment/Plan:  1. Acute on chronic systolic CHF: ?Primarily ischemic cardiomyopathy.  Echo 8/24 at time of last NSTEMI with EF 25-30%.  Echo this admission with EF 10-15%, severe LV dilation with LV thrombus, mild RV dysfunction.  His cardiomyopathy does seem out of proportion to his coronary disease.  He does not drink ETOH or use drugs.  No family  history of dilated cardiomyopathy (FH of CAD).  He does have uncontrolled DM as well as a chronic wide LBBB (176 msec) which could contribute.   PCWP elevated on RHC, CI low at 1.67.   - Remains on 0.25 milrinone. CO-OX 72% with estimated Fick CI of 2.45.   - Volume appears stable. - Continue digoxin 0.125 daily.  - Continue entresto 24/26mg  BID - Continue spironolactone 25 daily.  - Consider SGLT2i though now pending insurance and possiblt CRT-D. Will wait to start until procedures complete (will check dost with PharmD). - Hold Toprol XL with low output.  - PFTs okay - Insurance major barrier to VAD implant at this point.  - Suspect his LBBB is playing a part in his heart failure. Without insurance may be able to bridge him with CRT-D until open enrollment. HF CSW looking into options to see if we could potentially get him home with milrinone and try to wean over the next couple of months.   2. LV thrombus: continue heparin. Appears to have resolved on recent cardiac MRI, not seen on most recent echocardiogram 3/27.  3. PVCs/NSVT: Patient noted to have frequent PVCs on telemetry.   - With addition of milrinone, now on amiodarone 200 mg po bid.  - NSVT stable  4. CAD: Remote PCI to RCA, cath in 8/24 showed chronically occluded proximal RCA.  NSTEMI in 8/24 with occluded OM1 treated with DES.  LAD with minimal disease.  No chest pain currently.  HS-TnI not significantly elevated, no ACS this admission.  - He is now off Plavix (> 6 months post PCI).  - Continue ASA 81.  - Continue statin.   5. DM2: Poor control, hgbA1c 12>>11.6.   - Diabetes coordinator following - On standing insulin now + SSI.  - TOC clinic helping with discharge plan for insulin  6. Smoking: He has quit. -Last cigarette in January 2025  Unfortunately was initially denied insurance due to lack of a "qualifying life event". Plan made to place CRT-D and possible home inotrope in effort to try to bridge him to open  enrollment in December. Now family is working on Restaurant manager, fast food so CRT-D cancelled with hopes of having insurance approved.   Length of Stay: [address], AGACNP-BC 04/24/2023, 8:13 AM  Advanced Heart Failure Team Pager [phone] (M-F; 7a - 4p)  Please contact [facility] for night-coverage after hours (4p -7a ) and weekends on [email or web address]

## 2023-04-25 ENCOUNTER — Other Ambulatory Visit (HOSPITAL_COMMUNITY): Payer: Self-pay

## 2023-04-25 LAB — COOXEMETRY PANEL
Carboxyhemoglobin: 1.3 % (ref 0.5–1.5)
Carboxyhemoglobin: 1.4 % (ref 0.5–1.5)
Carboxyhemoglobin: 1.3 % (ref 0.5–1.5)
Carboxyhemoglobin: 1.5 % (ref 0.5–1.5)
Methemoglobin: <0.7 % (ref 0.0–1.5)
Methemoglobin: <0.7 % (ref 0.0–1.5)
Methemoglobin: <0.7 % (ref 0.0–1.5)
Methemoglobin: <0.7 % (ref 0.0–1.5)
O2 Saturation: 70.7 %
O2 Saturation: 62.5 %
O2 Saturation: 63.9 %
O2 Saturation: 69.9 %
Total hemoglobin: 15.4 g/dL (ref 12.0–16.0)
Total hemoglobin: 16.1 g/dL — ABNORMAL HIGH (ref 12.0–16.0)
Total hemoglobin: 15.8 g/dL (ref 12.0–16.0)
Total hemoglobin: 15.3 g/dL (ref 12.0–16.0)

## 2023-04-25 LAB — BASIC METABOLIC PANEL WITH GFR
Anion gap: 10 (ref 5–15)
BUN: 22 mg/dL — ABNORMAL HIGH (ref 6–20)
CO2: 27 mmol/L (ref 22–32)
Calcium: 9.1 mg/dL (ref 8.9–10.3)
Chloride: 97 mmol/L — ABNORMAL LOW (ref 98–111)
Creatinine, Ser: 1.18 mg/dL (ref 0.61–1.24)
GFR, Estimated: >60 mL/min (ref >60)
Glucose, Bld: 239 mg/dL — ABNORMAL HIGH (ref 70–99)
Potassium: 4.6 mmol/L (ref 3.5–5.1)
Sodium: 134 mmol/L — ABNORMAL LOW (ref 135–145)

## 2023-04-25 LAB — GLUCOSE, CAPILLARY
Glucose-Capillary: 173 mg/dL — ABNORMAL HIGH (ref 70–99)
Glucose-Capillary: 176 mg/dL — ABNORMAL HIGH (ref 70–99)
Glucose-Capillary: 61 mg/dL — ABNORMAL LOW (ref 70–99)
Glucose-Capillary: 84 mg/dL (ref 70–99)

## 2023-04-25 LAB — CBC
HCT: 45.5 % (ref 39.0–52.0)
Hemoglobin: 15 g/dL (ref 13.0–17.0)
MCH: 29.1 pg (ref 26.0–34.0)
MCHC: 33 g/dL (ref 30.0–36.0)
MCV: 88.3 fL (ref 80.0–100.0)
Platelets: 360 10*3/uL (ref 150–400)
RBC: 5.15 MIL/uL (ref 4.22–5.81)
RDW: 15.2 % (ref 11.5–15.5)
WBC: 11.1 10*3/uL — ABNORMAL HIGH (ref 4.0–10.5)
nRBC: 0 % (ref 0.0–0.2)

## 2023-04-25 LAB — HEPARIN LEVEL (UNFRACTIONATED): Heparin Unfractionated: 0.29 [IU]/mL — ABNORMAL LOW (ref 0.30–0.70)

## 2023-04-25 SURGERY — BIV ICD INSERTION CRT-D

## 2023-04-25 MED ORDER — AMIODARONE HCL 200 MG PO TABS
200.0000 mg | ORAL_TABLET | Freq: Two times a day (BID) | ORAL | 5 refills | Status: DC
  Filled 2023-04-25 – 2023-05-20 (×2): qty 60, 30d supply, fill #0
  Filled 2023-06-19: qty 60, 30d supply, fill #1
  Filled 2023-06-19: qty 60, 30d supply, fill #0
  Filled 2023-07-19: qty 60, 30d supply, fill #1
  Filled 2023-08-19 (×3): qty 60, 30d supply, fill #2
  Filled 2023-09-16 (×2): qty 60, 30d supply, fill #3

## 2023-04-25 MED ORDER — BLOOD GLUCOSE MONITORING SUPPL DEVI
1.0000 | Freq: Three times a day (TID) | 0 refills | Status: AC
  Filled 2023-04-25: qty 1, fill #0
  Filled 2023-05-20: qty 1, 30d supply, fill #0

## 2023-04-25 MED ORDER — FREESTYLE LIBRE 3 SENSOR MISC
1.0000 | Freq: Once | 3 refills | Status: AC
  Filled 2023-04-25: qty 2, fill #0
  Filled 2023-07-19: qty 2, 28d supply, fill #0

## 2023-04-25 MED ORDER — SACUBITRIL-VALSARTAN 24-26 MG PO TABS
1.0000 | ORAL_TABLET | Freq: Two times a day (BID) | ORAL | 5 refills | Status: AC
Start: 2023-04-25 — End: ?
  Filled 2023-04-25 – 2023-07-19 (×3): qty 60, 30d supply, fill #0

## 2023-04-25 MED ORDER — PANTOPRAZOLE SODIUM 40 MG PO TBEC
40.0000 mg | DELAYED_RELEASE_TABLET | Freq: Every day | ORAL | 5 refills | Status: DC
Start: 2023-04-25 — End: 2023-10-28
  Filled 2023-04-25 – 2023-05-20 (×2): qty 30, 30d supply, fill #0
  Filled 2023-06-19: qty 30, 30d supply, fill #1
  Filled 2023-06-19: qty 30, 30d supply, fill #0
  Filled 2023-07-19: qty 30, 30d supply, fill #1
  Filled 2023-08-19 (×3): qty 30, 30d supply, fill #2
  Filled 2023-09-16 (×2): qty 30, 30d supply, fill #3

## 2023-04-25 MED ORDER — BLOOD GLUCOSE TEST VI STRP
1.0000 | ORAL_STRIP | Freq: Three times a day (TID) | 0 refills | Status: AC
Start: 2023-04-25 — End: 2023-05-28
  Filled 2023-04-25: qty 100, 30d supply, fill #0

## 2023-04-25 MED ORDER — APIXABAN 5 MG PO TABS
5.0000 mg | ORAL_TABLET | Freq: Two times a day (BID) | ORAL | Status: DC
  Administered 2023-04-25: 5 mg via ORAL
  Filled 2023-04-25: qty 1

## 2023-04-25 MED ORDER — ATORVASTATIN CALCIUM 80 MG PO TABS
80.0000 mg | ORAL_TABLET | Freq: Every day | ORAL | 5 refills | Status: DC
  Filled 2023-04-25 – 2023-06-19 (×3): qty 30, 30d supply, fill #0
  Filled 2023-06-19: qty 30, 30d supply, fill #1
  Filled 2023-06-19: qty 30, 30d supply, fill #0
  Filled 2023-07-19: qty 30, 30d supply, fill #1
  Filled 2023-08-19 (×3): qty 30, 30d supply, fill #2
  Filled 2023-09-16 (×2): qty 30, 30d supply, fill #3

## 2023-04-25 MED ORDER — TRUEPLUS LANCETS 28G MISC
1.0000 | Freq: Three times a day (TID) | 0 refills | Status: AC
  Filled 2023-04-25: qty 100, 30d supply, fill #0

## 2023-04-25 MED ORDER — DIGOXIN 125 MCG PO TABS
0.1250 mg | ORAL_TABLET | Freq: Every day | ORAL | 5 refills | Status: DC
  Filled 2023-04-25 – 2023-05-20 (×2): qty 30, 30d supply, fill #0
  Filled 2023-06-19: qty 30, 30d supply, fill #1
  Filled 2023-06-19: qty 30, 30d supply, fill #0
  Filled 2023-07-19: qty 30, 30d supply, fill #1
  Filled 2023-08-19 (×3): qty 30, 30d supply, fill #2
  Filled 2023-09-16 (×2): qty 30, 30d supply, fill #3

## 2023-04-25 MED ORDER — SPIRONOLACTONE 25 MG PO TABS
25.0000 mg | ORAL_TABLET | Freq: Every day | ORAL | 5 refills | Status: DC
Start: 2023-04-26 — End: 2023-10-28
  Filled 2023-04-25 – 2023-06-19 (×3): qty 30, 30d supply, fill #0
  Filled 2023-06-19 – 2023-07-19 (×2): qty 30, 30d supply, fill #1
  Filled 2023-08-19 (×3): qty 30, 30d supply, fill #2
  Filled 2023-09-16 (×2): qty 30, 30d supply, fill #3

## 2023-04-25 MED ORDER — INSULIN PEN NEEDLE 32G X 4 MM MISC
0 refills | Status: AC
  Filled 2023-04-25: qty 100, 30d supply, fill #0

## 2023-04-25 MED ORDER — BUSPIRONE HCL 5 MG PO TABS
5.0000 mg | ORAL_TABLET | Freq: Two times a day (BID) | ORAL | 5 refills | Status: DC
  Filled 2023-04-25 – 2023-06-19 (×3): qty 60, 30d supply, fill #0
  Filled 2023-06-19 – 2023-07-19 (×2): qty 60, 30d supply, fill #1
  Filled 2023-08-19 (×3): qty 60, 30d supply, fill #2
  Filled 2023-09-16 (×2): qty 60, 30d supply, fill #3

## 2023-04-25 MED ORDER — LANCET DEVICE MISC
1.0000 | Freq: Three times a day (TID) | 0 refills | Status: AC
  Filled 2023-04-25: qty 1, 30d supply, fill #0

## 2023-04-25 MED ORDER — APIXABAN 5 MG PO TABS
5.0000 mg | ORAL_TABLET | Freq: Two times a day (BID) | ORAL | 5 refills | Status: DC
Start: 2023-04-25 — End: 2023-07-17
  Filled 2023-04-25 – 2023-05-27 (×4): qty 60, 30d supply, fill #0
  Filled 2023-06-19: qty 60, 30d supply, fill #1

## 2023-04-25 MED ORDER — INSULIN ISOPHANE & REGULAR (HUMAN 70-30)100 UNIT/ML KWIKPEN
20.0000 [IU] | PEN_INJECTOR | Freq: Two times a day (BID) | SUBCUTANEOUS | 11 refills | Status: AC
  Filled 2023-04-25 – 2023-05-20 (×2): qty 15, 30d supply, fill #0
  Filled 2023-06-19: qty 15, 30d supply, fill #1
  Filled 2023-06-19: qty 15, 30d supply, fill #0
  Filled 2023-07-19: qty 15, 30d supply, fill #1
  Filled 2023-08-19 (×2): qty 15, 30d supply, fill #2
  Filled 2023-09-16 (×2): qty 15, 30d supply, fill #3
  Filled 2023-10-28: qty 15, 30d supply, fill #4

## 2023-04-25 NOTE — Plan of Care (Signed)
  Problem: Education: Goal: Knowledge of General Education information will improve Description: Including pain rating scale, medication(s)/side effects and non-pharmacologic comfort measures Outcome: Progressing   Problem: Health Behavior/Discharge Planning: Goal: Ability to manage health-related needs will improve Outcome: Progressing   Problem: Clinical Measurements: Goal: Respiratory complications will improve Outcome: Progressing Goal: Cardiovascular complication will be avoided Outcome: Progressing   Problem: Activity: Goal: Risk for activity intolerance will decrease Outcome: Progressing   Problem: Nutrition: Goal: Adequate nutrition will be maintained Outcome: Progressing   Problem: Elimination: Goal: Will not experience complications related to bowel motility Outcome: Progressing Goal: Will not experience complications related to urinary retention Outcome: Progressing   Problem: Pain Managment: Goal: General experience of comfort will improve and/or be controlled Outcome: Progressing   Problem: Safety: Goal: Ability to remain free from injury will improve Outcome: Progressing

## 2023-04-25 NOTE — Progress Notes (Signed)
 Date and time results received: 04/24/23 at 2002 (use smartphrase ".now" to insert current time)  Test: CBG Critical Value: 38  Orders Received? Or Actions Taken?: Actions Taken: 4 oz orange juice X3 given Recheck CBG: 107

## 2023-04-25 NOTE — Telephone Encounter (Signed)
 [employer] is requesting proof of income before making a determination. Spoke with patient by phone, [employer] has docusigned an Research scientist (medical) for pt to sign and return. Pt is accessing the email with docusign link now. Will continue to follow for updates.

## 2023-04-25 NOTE — Inpatient Diabetes Management (Signed)
 Inpatient Diabetes Program Recommendations  AACE/ADA: New Consensus Statement on Inpatient Glycemic Control (2015)  Target Ranges:  Prepandial:   less than 140 mg/dL      Peak postprandial:   less than 180 mg/dL (1-2 hours)      Critically ill patients:  140 - 180 mg/dL   Lab Results  Component Value Date   GLUCAP 173 (H) 04/25/2023   HGBA1C 11.6 (H) 04/22/2023    Review of Glycemic Control  Diabetes history: DM 2 Outpatient Diabetes medications: metformin  Current orders for Inpatient glycemic control:  [place] 25 units qhs Novolog 0-9 units tid + hs Novolog 11 units tid meal coverage   Inpatient Diabetes Program Recommendations:    -   for discharge recommend ReliOn Novolin 70/30 20 units bid insulin pen, glucose meter kit from [place] and if pt desires Freestyle Libre 3 Sensors $75/month with good RX coupon.  NURSES: please document CBGs under the "CGM flowsheet" from his continuous glucose monitor so we have record of glucose trends and can more easily dose insulin.  Thanks, Christena Deem RN, MSN, BC-ADM Inpatient Diabetes Coordinator Team Pager [phone] (8a-5p)

## 2023-04-25 NOTE — TOC Transition Note (Signed)
 Transition of Care [facility]) - Discharge Note   Patient Details  Name: Kenneth [name] MRN: [id number] Date of Birth: 11-Dec-1965  [facility]) CM/SW Contact:  Elliot Cousin, RN Phone Number: [phone] 04/25/2023, 5:08 PM   Clinical Narrative:     HF TOC CM spoke to pt and wife at bedside. Utilized MATCH for medications not covered by [employer]. Arranged follow up with pt's PCP office with Mahalia Longest PA. Pt made aware. Wife will provide transportation home. Pt has Living Better with HF booklet. Has scale at home for daily weights.   Final next level of care: Home/Self Care Barriers to Discharge: No Barriers Identified   Patient Goals and CMS Choice Patient states their goals for this hospitalization and ongoing recovery are:: wants to get better          Discharge Placement                       Discharge Plan and Services Additional resources added to the After Visit Summary for     Discharge Planning Services: CM Consult                                 Social Drivers of Health (SDOH) Interventions SDOH Screenings   Food Insecurity: No Food Insecurity (04/12/2023)  Housing: Low Risk  (04/12/2023)  Transportation Needs: No Transportation Needs (04/12/2023)  Utilities: Not At Risk (04/12/2023)  Financial Resource Strain: Low Risk  (02/18/2023)   Received from [facility]  Social Connections: Unknown (04/09/2023)  Tobacco Use: High Risk (04/14/2023)     Readmission Risk Interventions     No data to display

## 2023-04-25 NOTE — Progress Notes (Signed)
 PHARMACY - ANTICOAGULATION CONSULT NOTE  Pharmacy Consult for heparin infusion > switch to Eliquis Indication: Large LV thrombus   No Known Allergies  Patient Measurements: Height: 5\' 9"  (175.3 cm) Weight: 72.7 kg (160 lb 3.2 oz) IBW/kg (Calculated) : 70.7 Heparin Dosing Weight: 76.2 kg  Vital Signs: Temp: 98.3 F (36.8 C) (04/03 0726) Temp Source: Oral (04/03 0726) BP: 101/66 (04/03 0529) Pulse Rate: 79 (04/03 0857)  Labs: Recent Labs    04/23/23 0420 04/24/23 0348 04/25/23 0529  HGB 15.1 15.0 15.0  HCT 47.1 46.0 45.5  PLT 347 365 360  HEPARINUNFRC 0.38 0.47 0.29*  CREATININE 1.14 1.21 1.18   Estimated Creatinine Clearance: 68.2 mL/min (by C-G formula based on SCr of 1.18 mg/dL).  Medical History: Past Medical History:  Diagnosis Date   Coronary artery disease    Diabetes mellitus without complication (HCC)    GERD (gastroesophageal reflux disease)    Hypertension    Stroke (cerebrum) (HCC)     Medications:  Scheduled:   amiodarone  200 mg Oral BID   apixaban  5 mg Oral BID   aspirin EC  81 mg Oral Daily   atorvastatin  80 mg Oral Daily   busPIRone  5 mg Oral BID   Chlorhexidine Gluconate Cloth  6 each Topical Q0600   digoxin  0.125 mg Oral Daily   insulin aspart  0-5 Units Subcutaneous QHS   insulin aspart  0-9 Units Subcutaneous TID WC   insulin aspart  11 Units Subcutaneous TID WC   insulin glargine-yfgn  25 Units Subcutaneous QHS   melatonin  2.5 mg Oral QHS   multivitamin with minerals  1 tablet Oral Daily   pantoprazole  40 mg Oral Daily   sacubitril-valsartan  1 tablet Oral BID   sodium chloride flush  10-40 mL Intracatheter Q12H   spironolactone  25 mg Oral Daily   Assessment:  58 y.o. male with CAD s/p PCI in 08/2022, hypertension, GERD, type 2 diabetes, hyperlipidemia, and tobacco use who presented with worsening shortness of breath over several months. Large LV thrombus noted on ECHO. No chronic anticoagulation noted based on dispense  record. Pt does not have insurance.   Heparin level this morning remains within goal range at 0.29.  No overt bleeding or complications noted.  Remains on heparin while awaiting insurance approval.  Planning to go home this afternoon on Eliquis while awaiting insurance approval.  Goal of Therapy:  Heparin level 0.3-0.7 units/ml Monitor platelets by anticoagulation protocol: Yes  Plan:  Stop IV heparin Start Eliquis 5 mg po BID. Can use 30d free card at discharge, will likely need patient assistance going forward if he qualifies.  Reece Leader, Colon Flattery, BCCP Clinical Pharmacist  04/25/2023 10:56 AM   [facility] pharmacy phone numbers are listed on [email or web address]

## 2023-04-25 NOTE — Inpatient Diabetes Management (Deleted)
 Na

## 2023-04-25 NOTE — Progress Notes (Addendum)
 Patient provided with verbal discharge instructions. Paper copy of discharge provided to patient and wife. RN answered all questions. VSS at discharge. IV removed. PICC line removed and bedrest completed. Patient belongings sent with patient. Prescriptions at bedside. Patient dc'd to private vehicle

## 2023-04-25 NOTE — Progress Notes (Signed)
 RA TL PICC removed per protocol per MD order. Manual pressure applied for 4 mins. Vaseline gauze, gauze, and Tegaderm applied over insertion site. No bleeding or swelling noted. Instructed patient to remain in bed for thirty mins. Educated patient about S/S of infection and when to call MD; no heavy lifting or pressure on right side for 24 hours; keep dressing dry and intact for 24 hours. Pt verbalized comprehension.

## 2023-04-25 NOTE — Discharge Instructions (Addendum)
 [facility]  Novolin ReliOn 70/30 insulin 20 units twice a day before breakfast and dinner You will also need to ensure you have the insulin pen needles to be able to inject the insulin

## 2023-04-25 NOTE — Progress Notes (Signed)
 Advanced Heart Failure Rounding Note  Subjective:    CO-OX 71% on 0.125 milrinone. Renal function stable.   Blood glucose has been labile, 293 yesterday am >> 38 last night.  Wants to go home.     Objective:   Echo LVEF 10-15%, severely dilated LV (6.7 LVIDD) w/ large LV thrombus. RV mildly reduced.   LHC 8/24:  LM ok LAD minor irregs LCx 100% large OM1 -> PCI  RCA CTO prox RHC 04/11/23 RA 8 PA 53/26 (36) PW 26 PVR 3.1 Fick 3.2/1.7 PAPi 2.1  Vital Signs:   Temp:  [97.6 F (36.4 C)-98.6 F (37 C)] 98.3 F (36.8 C) (04/03 0726) Pulse Rate:  [76-89] 89 (04/03 0726) Resp:  [17-18] 17 (04/03 0726) BP: (101-124)/(61-82) 101/66 (04/03 0529) SpO2:  [95 %-98 %] 95 % (04/03 0726) Weight:  [72.7 kg] 72.7 kg (04/03 0500) Last BM Date : 04/23/23  Weight change: Filed Weights   04/22/23 0447 04/23/23 0500 04/25/23 0500  Weight: 72 kg 72.4 kg 72.7 kg   Intake/Output:   Intake/Output Summary (Last 24 hours) at 04/25/2023 0824 Last data filed at 04/25/2023 0802 Gross per 24 hour  Intake 1518.39 ml  Output 3175 ml  Net -1656.61 ml     Physical Exam: General:  Well appearing. Sitting up on side of bed Neck: no JVD.  Lungs: Breathing nonlabored Extremities: no edema, RUE PICC Neuro: alert & orientedx3.    Tele: SR 80s (personally reviewed)  Labs: Basic Metabolic Panel: Recent Labs  Lab 04/19/23 0552 04/20/23 0559 04/21/23 0500 04/22/23 0450 04/22/23 0500 04/23/23 0420 04/24/23 0348 04/25/23 0529  NA 135   < > 138 136  --  137 135 134*  K 4.6   < > 4.4 4.5  --  4.7 4.3 4.6  CL 100   < > 105 101  --  100 99 97*  CO2 26   < > 25 27  --  25 22 27   GLUCOSE 220*   < > 118* 264*  --  207* 244* 239*  BUN 31*   < > 18 14  --  17 23* 22*  CREATININE 1.25*   < > 1.09 1.13  --  1.14 1.21 1.18  CALCIUM 9.3   < > 9.4 8.8*  --  9.4 9.2 9.1  MG 1.9  --  1.9  --  2.1  --   --   --    < > = values in this interval not displayed.    Liver Function Tests: No results for  input(s): "AST", "ALT", "ALKPHOS", "BILITOT", "PROT", "ALBUMIN" in the last 168 hours.  No results for input(s): "LIPASE", "AMYLASE" in the last 168 hours. No results for input(s): "AMMONIA" in the last 168 hours.  CBC: Recent Labs  Lab 04/21/23 0500 04/22/23 0450 04/23/23 0420 04/24/23 0348 04/25/23 0529  WBC 9.7 8.5 8.1 11.5* 11.1*  HGB 15.5 14.5 15.1 15.0 15.0  HCT 48.4 45.1 47.1 46.0 45.5  MCV 89.8 89.0 89.2 89.3 88.3  PLT 365 351 347 365 360    Cardiac Enzymes: No results for input(s): "CKTOTAL", "CKMB", "CKMBINDEX", "TROPONINI" in the last 168 hours.  BNP: BNP (last 3 results) Recent Labs    02/18/23 1348 04/09/23 0121  BNP 697.3* 1,185.6*   ProBNP (last 3 results) No results for input(s): "PROBNP" in the last 8760 hours.  Other results:  Medications:   Scheduled Medications:  amiodarone  200 mg Oral BID   aspirin EC  81 mg Oral Daily  atorvastatin  80 mg Oral Daily   busPIRone  5 mg Oral BID   Chlorhexidine Gluconate Cloth  6 each Topical Q0600   digoxin  0.125 mg Oral Daily   insulin aspart  0-5 Units Subcutaneous QHS   insulin aspart  0-9 Units Subcutaneous TID WC   insulin aspart  11 Units Subcutaneous TID WC   insulin glargine-yfgn  25 Units Subcutaneous QHS   melatonin  2.5 mg Oral QHS   multivitamin with minerals  1 tablet Oral Daily   pantoprazole  40 mg Oral Daily   sacubitril-valsartan  1 tablet Oral BID   sodium chloride flush  10-40 mL Intracatheter Q12H   spironolactone  25 mg Oral Daily    Infusions:  heparin 1,100 Units/hr (04/24/23 2321)   milrinone 0.125 mcg/kg/min (04/24/23 2321)    PRN Medications:   Assessment/Plan:  1. Acute on chronic systolic CHF: ?Primarily ischemic cardiomyopathy.  Echo 8/24 at time of last NSTEMI with EF 25-30%.  Echo this admission with EF 10-15%, severe LV dilation with LV thrombus, mild RV dysfunction.  His cardiomyopathy does seem out of proportion to his coronary disease.  He does not drink ETOH or  use drugs.  No family history of dilated cardiomyopathy (FH of CAD).  He does have uncontrolled DM as well as a chronic wide LBBB (176 msec) which could contribute.   PCWP elevated on RHC, CI low at 1.67.   - CO-OX 71% on 0.125 milrinone. Stop milrinone today. Check CO-OX at noon.  Hopeful that we can keep him off inotrope support and he can go home to await VAD implant. - Volume appears stable. - Continue digoxin 0.125 daily.  - Continue entresto 24/26mg  BID - Continue spironolactone 25 daily.  - SGLT2i currently cost prohibitive - Hold Toprol XL with low output.  - PFTs okay - Insurance major barrier to VAD implant at this point. Denied insurance d/t lack of qualifying life event. 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  2. LV thrombus: continue heparin. Appears to have resolved on recent cardiac MRI, not seen on most recent echocardiogram 3/27.  3. PVCs/NSVT: Patient noted to have frequent PVCs on telemetry.   - Suppressed. now on amiodarone 200 mg po bid.  - NSVT stable  4. CAD: Remote PCI to RCA, cath in 8/24 showed chronically occluded proximal RCA.  NSTEMI in 8/24 with occluded OM1 treated with DES.  LAD with minimal disease.  No chest pain currently.  HS-TnI not significantly elevated, no ACS this admission.  - He is now off Plavix (> 6 months post PCI).  - Continue ASA 81.  - Continue statin.   5. DM2: Poor control, hgbA1c 12>>11.6.   - Diabetes coordinator following - On standing insulin now + SSI.  - TOC clinic helping with discharge plan for insulin  6. Smoking: He has quit. -Last cigarette in January 2025    Length of Stay: [address], [place], [place] 04/25/2023, 8:24 AM  Advanced Heart Failure Team Pager [phone] (M-F; 7a - 4p)  Please contact [facility] for night-coverage after hours (4p -7a ) and weekends on [email or web address]

## 2023-04-25 NOTE — Discharge Summary (Signed)
 Advanced Heart Failure Team  Discharge Summary   Patient ID: Kenneth [name] MRN: [id number], DOB/AGE: [id number] y.o. Admit date: 04/12/2023 D/C date:     04/25/2023   Primary Discharge Diagnoses:  Acute on chronic systolic CHF  Secondary Discharge Diagnoses:  LV thrombus NSVT CAD DM II Tobacco use  Hospital Course:   [name] is a 58 y.o. male with history of CAD, chronic HFrEF, LBBB, poorly controlled DM II, HTN, HLD, prior CVA, tobacco use.   Admitted to [facility] 03/2023 with acute on chronic CHF and NYHA IV symptoms. Echo with EF 10-15%, severely dilated LV, large LV thrombus and mildly reduced RV. RHC w/ severely elevated filling pressures and severely reduced CI at 1.67. Not eligible for transplant d/t smoking history. He was transferred to [facility] for optimization with inotropes/IV diuresis and urgent LVAD workup. He responded well to milrinone. GDMT was optimized and he was diuresed. Unable to proceed with LVAD implant d/t lack of insurance. He was denied Psychologist, counselling. His family appealed Dealer and is awaiting insurance approval. He successfully weaned off milrinone and was stable for discharge home. Outpatient follow-up with Advanced Heart Failure Clinic at [facility] arranged.    Medications sent to [facility] pharmacy. 30 day free cards used for eliquis and entresto, MATCH/HF fund used for remainder of medications.   See below for hospital course by Problem.    Hospital Course by Problem: 1. Acute on chronic systolic CHF: ?Primarily ischemic cardiomyopathy.  Echo 8/24 at time of last NSTEMI with EF 25-30%.  Echo this admission with EF 10-15%, severe LV dilation with LV thrombus, mild RV dysfunction.  His cardiomyopathy does seem out of proportion to his coronary disease.  He does not drink ETOH or use drugs.  No family history of dilated cardiomyopathy (FH of CAD).  He does have uncontrolled DM as well as a chronic wide LBBB (176 msec) which could contribute.    PCWP elevated on RHC, CI low at 1.67.   - Milrinone stopped today with stable CO-OX this afternoon. PICC pulled. Hopeful that we can keep him off inotrope support and he can go home to await VAD implant. - Volume appears stable. - Continue digoxin 0.125 daily.  - Continue entresto 24/26mg  BID - Continue spironolactone 25 daily.  - SGLT2i currently cost prohibitive - Hold Toprol XL with low output.  - PFTs okay - Insurance major barrier to VAD implant at this point. Denied insurance d/t lack of qualifying life event. 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   2. LV thrombus: continue eliquis. Appears to have resolved on recent cardiac MRI, not seen on most recent echocardiogram 3/27.   3. PVCs/NSVT: Patient noted to have frequent PVCs on telemetry.   - Now suppressed on amiodarone 200 mg po bid.    4. CAD: Remote PCI to RCA, cath in 8/24 showed chronically occluded proximal RCA.  NSTEMI in 8/24 with occluded OM1 treated with DES.  LAD with minimal disease.  No chest pain currently.  HS-TnI not significantly elevated, no ACS this admission.  - He is now off Plavix (> 6 months post PCI).  - Continue ASA 81.  - Continue statin.    5. DM2: Poor control, hgbA1c 12>>11.6.   - On standing insulin 70/30, 20 units twice daily - diabetes coordinator assisted with ordering home supplies - PCP f/u arranged   6. Smoking: He  has quit. -Last cigarette in January 2025   Discharge Vitals: Blood pressure 107/72, pulse 86, temperature 98.7 F (37.1 C), temperature source Oral, resp. rate 16, height 5\' 9"  (1.753 m), weight 72.7 kg, SpO2 98%.  Labs: Lab Results  Component Value Date   WBC 11.1 (H) 04/25/2023   HGB 15.0 04/25/2023   HCT 45.5 04/25/2023   MCV 88.3 04/25/2023   PLT 360 04/25/2023    Recent Labs  Lab 04/25/23 0529  NA 134*  K 4.6  CL 97*  CO2 27  BUN 22*  CREATININE 1.18   CALCIUM 9.1  GLUCOSE 239*   Lab Results  Component Value Date   CHOL 106 04/12/2023   HDL 29 (L) 04/12/2023   LDLCALC 55 04/12/2023   TRIG 108 04/12/2023   BNP (last 3 results) Recent Labs    02/18/23 1348 04/09/23 0121  BNP 697.3* 1,185.6*    ProBNP (last 3 results) No results for input(s): "PROBNP" in the last 8760 hours.   Diagnostic Studies/Procedures   cMRI 04/15/23 IMPRESSION: 1. Severely dilated LV. Global hypokinesis with septal-lateral dyssynchrony consistent with LBBB, inferior akinesis also noted. LV EF 20%. 2.  Normal RV size with RV EF 34%.  3. LGE in the basal to mid inferior wall and the mid inferolateral wall suggestive of prior subendocardial MI. There is also subtle mid-wall LGE in the basal to mid septum. This can be nonspecific and seen with dilated cardiomyopathy with volume overload. I do not think that MI-type scar can explain the degree of cardiomyopathy. 4. Mildly elevated extracellular volume percentage suggestive of elevated myocardial fibrotic content (can be explained by area of subendocardial MI).    Discharge Medications   Allergies as of 04/25/2023   No Known Allergies      Medication List     STOP taking these medications    acetaminophen 325 MG tablet Commonly known as: TYLENOL   acetaminophen 650 MG suppository Commonly known as: TYLENOL   albuterol (2.5 MG/3ML) 0.083% nebulizer solution Commonly known as: PROVENTIL   enoxaparin 100 MG/ML injection Commonly known as: LOVENOX   guaiFENesin-dextromethorphan 100-10 MG/5ML syrup Commonly known as: ROBITUSSIN DM   heparin 20254 UT/250ML infusion   HYDROcodone-acetaminophen 5-325 MG tablet Commonly known as: NORCO/VICODIN   insulin aspart 100 UNIT/ML injection Commonly known as: novoLOG   insulin glargine 100 UNIT/ML injection Commonly known as: LANTUS   isosorbide mononitrate 30 MG 24 hr tablet Commonly known as: IMDUR   losartan 25 MG tablet Commonly known as:  COZAAR   melatonin 5 MG Tabs   metoprolol succinate 25 MG 24 hr tablet Commonly known as: TOPROL-XL   mometasone-formoterol 200-5 MCG/ACT Aero Commonly known as: DULERA   morphine (PF) 2 MG/ML injection   Nicotine Step 1 21 MG/24HR patch Generic drug: nicotine   nitroGLYCERIN 0.4 MG SL tablet Commonly known as: NITROSTAT   ondansetron 4 MG tablet Commonly known as: ZOFRAN   ondansetron 4 MG/2ML Soln injection Commonly known as: ZOFRAN   warfarin 5 MG tablet Commonly known as: COUMADIN       TAKE these medications    amiodarone 200 MG tablet Commonly known as: PACERONE Take 1 tablet (200 mg total) by mouth 2 (two) times daily.   aspirin EC 81 MG tablet Take 1 tablet (81 mg total) by mouth daily. Swallow whole.   atorvastatin 80 MG tablet Commonly known as: LIPITOR Take 1 tablet (80 mg total) by mouth daily. Start taking on: April 26, 2023 What changed: when to take  this   Blood Glucose Monitoring Suppl Devi 1 each by Does not apply route in the morning, at noon, and at bedtime. May substitute to any manufacturer covered by patient's insurance.   busPIRone 5 MG tablet Commonly known as: BUSPAR Take 1 tablet (5 mg total) by mouth 2 (two) times daily.   digoxin 0.125 MG tablet Commonly known as: LANOXIN Take 1 tablet (0.125 mg total) by mouth daily. Start taking on: April 26, 2023   Eliquis 5 MG Tabs tablet Generic drug: apixaban Take 1 tablet (5 mg total) by mouth 2 (two) times daily.   Entresto 24-26 MG Generic drug: sacubitril-valsartan Take 1 tablet by mouth 2 (two) times daily.   FreeStyle Libre 3 Sensor Misc 1 Device by Does not apply route once for 1 dose. Place 1 sensor on the skin every 14 days. Use to check glucose continuously   HumuLIN 70/30 KwikPen (70-30) 100 UNIT/ML KwikPen Generic drug: insulin isophane & regular human KwikPen Inject 20 Units into the skin 2 (two) times daily.   pantoprazole 40 MG tablet Commonly known as:  PROTONIX Take 1 tablet (40 mg total) by mouth daily.   spironolactone 25 MG tablet Commonly known as: ALDACTONE Take 1 tablet (25 mg total) by mouth daily. Start taking on: April 26, 2023 What changed: how much to take   TechLite Plus Pen Needles 32G X 4 MM Misc Generic drug: Insulin Pen Needle Use with insulin pen   True Metrix Blood Glucose Test test strip Generic drug: glucose blood Use in the morning, at noon, and at bedtime. May substitute to any manufacturer covered by patient's insurance.   TRUEdraw Lancing Device Misc Use in the morning, at noon, and at bedtime.   TRUEplus Lancets 28G Misc Use 1 each in the morning, at noon, and at bedtime.        Disposition   The patient will be discharged in stable condition to home. Discharge Instructions     (HEART FAILURE PATIENTS) Call MD:  Anytime you have any of the following symptoms: 1) 3 pound weight gain in 24 hours or 5 pounds in 1 week 2) shortness of breath, with or without a dry hacking cough 3) swelling in the hands, feet or stomach 4) if you have to sleep on extra pillows at night in order to breathe.   Complete by: As directed    Diet - low sodium heart healthy   Complete by: As directed    Heart Failure patients record your daily weight using the same scale at the same time of day   Complete by: As directed    Increase activity slowly   Complete by: As directed    PICC line removal   Complete by: As directed    STOP any activity that causes chest pain, shortness of breath, dizziness, sweating, or exessive weakness   Complete by: As directed        Follow-up Information     [facility]. Go on 05/07/2023.   Specialty: Cardiology Why: Hospital Follow-Up 05/07/23 @ 2:15PM Please bring all medications to follow-up appointment [facility] Floor Free Valet parking at the door Contact information: [address] [place] [address] [phone]        Marguarite Arbour, MD Follow up.   Specialty: Internal Medicine Why: Appt arranged with [facility] on 05/01/2023 at 930 am. your provider did not have hospital follow up. Contact  information: [address] [facility] [place] [facility] [place] [address] [phone]                   Duration of Discharge Encounter: Greater than 35 minutes   Signed, [facility], Naia Ruff N  04/25/2023, 5:04 PM

## 2023-04-25 NOTE — TOC CM/SW Note (Addendum)
 MATCH MEDICATION ASSISTANCE CARD Pharmacies please call [phone] for claim processing assistance.  Rx BIN: [id number] Rx Group: [id number] Rx PCN: [id number] Relationship Code: 1 Person Code: 01  Patient ID (MRN): MOSES  [id number]    Patient Name: Kenneth [name]   Patient DOB: 23-Feb-1965   Discharge Date: 04/25/2023  Expiration Date: (must be filled within 7 days of discharge)

## 2023-04-26 ENCOUNTER — Other Ambulatory Visit (HOSPITAL_COMMUNITY): Payer: Self-pay

## 2023-04-29 NOTE — Telephone Encounter (Signed)
 Patient was approved to receive Entresto from [employer] Effective 04/26/2023 to 04/25/2024.  Patient has been informed by phone, and provided with the contact number for [employer].

## 2023-05-06 ENCOUNTER — Telehealth: Payer: Self-pay | Admitting: Cardiology

## 2023-05-06 NOTE — Telephone Encounter (Incomplete)
 Called to confirm/remind patient of their appointment at the Advanced Heart Failure Clinic on 05/07/23.   Appointment:   [x] Confirmed  [] Left mess   [] No answer/No voice mail  [] Phone not in service  Patient reminded to bring all medications and/or complete list.  Confirmed patient has transportation. Gave directions, instructed to utilize valet parking.

## 2023-05-07 ENCOUNTER — Ambulatory Visit: Payer: MEDICAID | Attending: Cardiology | Admitting: Cardiology

## 2023-05-07 VITALS — BP 98/65 | HR 82 | Wt 170.0 lb

## 2023-05-07 DIAGNOSIS — I5042 Chronic combined systolic (congestive) and diastolic (congestive) heart failure: Secondary | ICD-10-CM

## 2023-05-07 DIAGNOSIS — I63211 Cerebral infarction due to unspecified occlusion or stenosis of right vertebral arteries: Secondary | ICD-10-CM

## 2023-05-07 NOTE — Patient Instructions (Signed)
 Great to see you today!!!  No changes, continue current medications  Your physician recommends that you schedule a follow-up appointment in: 1 month  Do the following things EVERYDAY: Weigh yourself in the morning before breakfast. Write it down and keep it in a log. Take your medicines as prescribed Eat low salt foods--Limit salt (sodium) to 2000 mg per day.  Stay as active as you can everyday Limit all fluids for the day to less than 2 liters   If you have any questions or concerns before your next appointment please send us  a message through [place] or call our office at [phone], If it is after office hours your call will be answered by our answering service and directed appropriately.     At the Advanced Heart Failure Clinic, you and your health needs are our priority. We have a designated team specialized in the treatment of Heart Failure. This Care Team includes your primary Heart Failure Specialized Cardiologist (physician), Advanced Practice Providers (APPs- Physician Assistants and Nurse Practitioners), and Pharmacist who all work together to provide you with the care you need, when you need it.   You may see any of the following providers on your designated Care Team at your next follow up:  Dr. Jules Oar Dr. Peder Bourdon Dr. Alwin Baars Dr. Judyth Nunnery Nieves Bars, NP Ruddy Corral, [place] [address] [place], [place] Dennise Fitz, NP [place] Lee, NP Shaw[place] Dellen, NP Bevely Brush, PharmD

## 2023-05-07 NOTE — Progress Notes (Unsigned)
 ADVANCED HEART FAILURE FOLLOW UP CLINIC NOTE  Referring Physician: Yehuda Helms, MD  Primary Care: Yehuda Helms, MD Primary Cardiologist:  HPI: Kenneth [name] is a 58 y.o. male with a past medical history of CAD SP PCI to the RCA and OM, uncontrolled diabetes, hyperlipidemia, hypertension, history of CVA, tobacco abuse, HFrEF who presents for follow up of chronic systolic heart failure.      Patient has a longstanding history of cardiac disease.  His cardiomyopathy history dates back to at least 2017, where he had a limited echo showing ejection fraction 45 to 50%.  His EF worsened in late 2017 to 30 to 35%.  He was hospitalized 08/2022 with NSTEMI with further reduction in EF to 20 to 25% and new left bundle branch block.  He underwent PCI to the OM at that time.  Symptoms progressed over the next few months and he was admitted to the hospital 03/2023 for NYHA class IV HF symptoms.  He was initially diuresed but remained symptomatic so right heart catheterization was performed which showed elevated left-sided filling pressures and severely reduced cardiac index of 1.6.  Patient was transferred to [facility] for evaluation for LVAD.  Not a candidate for transplant given recent tobacco use as well as A1c of 12.  He improved rapidly with IV milrinone .  However, his course was complicated by issues with insurance.  Despite multiple attempts, patient did not qualify for Medicaid and was unable to obtain [employer].  This was in spite of multiple social work consultations, appreciate their extensive assistance with this case.  Patient was considered for CRT-D given his wide left bundle branch block.  However, given ongoing appeals to [employer] we elected to hold off at this time.  Patient was weaned off milrinone  successfully and discharged in stable condition.     SUBJECTIVE:  Patient overall reports that he has been doing well.  He is a very active person and  has found it difficult to stay away from his job.  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  He denies any worsening shortness of breath, orthopnea, PND, lower extremity swelling.  His weight is up since discharge, but he reports that is because he has been eating better.  He denies any recent lower extremity swelling.  Still waiting on insurance appeals, no room for titration of medication.  PMH, current medications, allergies, social history, and family history reviewed in epic.  PHYSICAL EXAM: Vitals:   05/07/23 1415  BP: 98/65  Pulse: 82  SpO2: 99%   GENERAL: Well nourished and in no apparent distress at rest.  PULM:  Normal work of breathing, clear to auscultation bilaterally. Respirations are unlabored.  CARDIAC:  JVP: Flat         Normal rate with regular rhythm. No murmurs, rubs or gallops.  No lower extremity edema. Warm and well perfused extremities. ABDOMEN: Soft, non-tender, non-distended. NEUROLOGIC: Patient is oriented x3 with no focal or lateralizing neurologic deficits.    DATA REVIEW  ECG: 04/12/23: NSR, LBBB    ECHO: 04/18/23: LVEF 20-25%, normal RV, no LV thrombus   CATH: 09/04/2022: CTO of the RCA with [place] to collateralization, PCI to 100% occluded OM1, LAD with minor irregularities  Heart failure review: - Classification: Heart failure with reduced EF - Etiology: Ischemic and Cardiomyopathy due to LBBB - NYHA Class: III - Volume status: Euvolemic - ACEi/ARB/ARNI: Currently up-titrating - Aldosterone antagonist:  Maximally tolerated dose - Beta-blocker: Intolerant - Digoxin : Maximally tolerated dose - Hydralazine /Nitrates: Not indicated - SGLT2i: Intolerant - GLP-1: Consider in future - Advanced therapies: Actively working up - ICD: Advanced therapies evaluation  ASSESSMENT & PLAN:  Chronic systolic CHF: Primarily ischemic cardiomyopathy, though likely with some element due to LBBB.  Recent  admission for ACC/AHA stage D cardiomyopathy, NYHA Class IV. Planned LVAD, but delayed due to inusrance as above. Weaned off milrinone  with improvement of symptoms. Insurance appeal ongoing, otherwise will try to bridge to open enrollement, if A1c improved could consider transplant at that time.    - Euvolemic - Continue digoxin  0.125 daily.  - Continue entresto  24/26mg  BID - Continue spironolactone  25 daily.  - Holding SGLT-2 with diabetes, can consider in the future - Hold Toprol  XL with low output.  - PFTs okay - Insurance major barrier to VAD implant at this point.  - If insurance denies can consider CRT-D as he would have time to recover prior to implant, discuss at next visit   2. LV thrombus: Resolved on imaging, function still reduced so conitnue OAC. - Continue apixaban  5mg  BID, aspirin  81mg  daily   3. PVCs/NSVT: Patient noted to have frequent PVCs on telemetry while inpatient. - On amiodarone  200 mg po bid.  - Consider weaning off if CRT-D placed now that off milrinone    4. CAD: Remote PCI to RCA, cath in 8/24 showed chronically occluded proximal RCA.  NSTEMI in 8/24 with occluded OM1 treated with DES.  LAD with minimal disease.  No anginal symptoms. - He is now off Plavix  (> 6 months post PCI).  - Continue ASA 81, apixaban  - Off aspirin  in 08/2023 - Continue statin.    5. DM2: Poor control, hgbA1c 12>>11.6.   - Instruced to reach out to PCP for insulin  refills   6. Smoking: He has quit. -Last cigarette in January 2025  7. Prior CVA:  - No deficits  Follow up in 1 month  Arta Lark, MD Advanced Heart Failure Mechanical Circulatory Support 05/10/23

## 2023-05-20 ENCOUNTER — Other Ambulatory Visit (HOSPITAL_COMMUNITY): Payer: Self-pay

## 2023-05-20 ENCOUNTER — Other Ambulatory Visit: Payer: Self-pay

## 2023-05-21 ENCOUNTER — Other Ambulatory Visit: Payer: Self-pay

## 2023-05-21 ENCOUNTER — Other Ambulatory Visit (HOSPITAL_COMMUNITY): Payer: Self-pay

## 2023-05-21 ENCOUNTER — Encounter: Payer: Self-pay | Admitting: Pharmacist

## 2023-05-22 ENCOUNTER — Other Ambulatory Visit (HOSPITAL_COMMUNITY): Payer: Self-pay

## 2023-05-23 ENCOUNTER — Other Ambulatory Visit (HOSPITAL_COMMUNITY): Payer: Self-pay

## 2023-05-23 ENCOUNTER — Other Ambulatory Visit: Payer: Self-pay

## 2023-05-24 ENCOUNTER — Telehealth (HOSPITAL_COMMUNITY): Payer: Self-pay

## 2023-05-27 ENCOUNTER — Other Ambulatory Visit: Payer: Self-pay

## 2023-05-30 ENCOUNTER — Telehealth (HOSPITAL_COMMUNITY): Payer: Self-pay

## 2023-05-30 NOTE — Telephone Encounter (Signed)
 Advanced Heart Failure Patient Advocate Encounter  Application for Eliquis  faxed to [employer] on 05/30/2023. Application form attached to patient chart.  Kennis Peacock, CPhT Rx Patient Advocate Phone: [phone]

## 2023-05-31 ENCOUNTER — Telehealth: Payer: Self-pay | Admitting: Cardiology

## 2023-05-31 NOTE — Telephone Encounter (Signed)
 Called to confirm/remind patient of their appointment at the Advanced Heart Failure Clinic on 06/03/23.   Appointment:   [x] Confirmed  [] Left mess   [] No answer/No voice mail  [] VM Full/unable to leave message  [] Phone not in service  Patient reminded to bring all medications and/or complete list.  Confirmed patient has transportation. Gave directions, instructed to utilize valet parking.

## 2023-06-03 ENCOUNTER — Ambulatory Visit: Payer: MEDICAID | Attending: Cardiology | Admitting: Cardiology

## 2023-06-03 ENCOUNTER — Encounter: Payer: Self-pay | Admitting: Cardiology

## 2023-06-03 VITALS — BP 118/70 | HR 78 | Wt 163.0 lb

## 2023-06-03 DIAGNOSIS — E785 Hyperlipidemia, unspecified: Secondary | ICD-10-CM | POA: Insufficient documentation

## 2023-06-03 DIAGNOSIS — I252 Old myocardial infarction: Secondary | ICD-10-CM | POA: Insufficient documentation

## 2023-06-03 DIAGNOSIS — Z7901 Long term (current) use of anticoagulants: Secondary | ICD-10-CM | POA: Insufficient documentation

## 2023-06-03 DIAGNOSIS — Z7982 Long term (current) use of aspirin: Secondary | ICD-10-CM | POA: Insufficient documentation

## 2023-06-03 DIAGNOSIS — Z8673 Personal history of transient ischemic attack (TIA), and cerebral infarction without residual deficits: Secondary | ICD-10-CM | POA: Insufficient documentation

## 2023-06-03 DIAGNOSIS — Z955 Presence of coronary angioplasty implant and graft: Secondary | ICD-10-CM | POA: Insufficient documentation

## 2023-06-03 DIAGNOSIS — I493 Ventricular premature depolarization: Secondary | ICD-10-CM | POA: Insufficient documentation

## 2023-06-03 DIAGNOSIS — I447 Left bundle-branch block, unspecified: Secondary | ICD-10-CM | POA: Insufficient documentation

## 2023-06-03 DIAGNOSIS — F17210 Nicotine dependence, cigarettes, uncomplicated: Secondary | ICD-10-CM | POA: Insufficient documentation

## 2023-06-03 DIAGNOSIS — I110 Hypertensive heart disease with heart failure: Secondary | ICD-10-CM | POA: Insufficient documentation

## 2023-06-03 DIAGNOSIS — I5022 Chronic systolic (congestive) heart failure: Secondary | ICD-10-CM | POA: Insufficient documentation

## 2023-06-03 DIAGNOSIS — I5042 Chronic combined systolic (congestive) and diastolic (congestive) heart failure: Secondary | ICD-10-CM

## 2023-06-03 DIAGNOSIS — I5084 End stage heart failure: Secondary | ICD-10-CM | POA: Insufficient documentation

## 2023-06-03 DIAGNOSIS — Z79899 Other long term (current) drug therapy: Secondary | ICD-10-CM | POA: Insufficient documentation

## 2023-06-03 DIAGNOSIS — M549 Dorsalgia, unspecified: Secondary | ICD-10-CM | POA: Insufficient documentation

## 2023-06-03 DIAGNOSIS — I255 Ischemic cardiomyopathy: Secondary | ICD-10-CM | POA: Insufficient documentation

## 2023-06-03 DIAGNOSIS — I2510 Atherosclerotic heart disease of native coronary artery without angina pectoris: Secondary | ICD-10-CM | POA: Insufficient documentation

## 2023-06-03 DIAGNOSIS — E1165 Type 2 diabetes mellitus with hyperglycemia: Secondary | ICD-10-CM | POA: Insufficient documentation

## 2023-06-03 DIAGNOSIS — Z7902 Long term (current) use of antithrombotics/antiplatelets: Secondary | ICD-10-CM | POA: Insufficient documentation

## 2023-06-03 MED ORDER — NICOTINE POLACRILEX 2 MG MT GUM: 2.0000 mg | CHEWING_GUM | OROMUCOSAL | 0 refills | Status: AC | PRN

## 2023-06-03 MED ORDER — NICOTINE 14 MG/24HR TD PT24: 14.0000 mg | MEDICATED_PATCH | Freq: Every day | TRANSDERMAL | 0 refills | Status: AC

## 2023-06-03 NOTE — Progress Notes (Unsigned)
 Patient declined to wait for AVS paperwork and discharge information. Pt to view his MyChart for today's ofv information.

## 2023-06-03 NOTE — Patient Instructions (Addendum)
 Special Instructions:  Your Nicotine  gum and patches have been sent to your pharmacy. Please follow up with your Primary Care Provider for further refills.   Referrals:  We have placed a referral today to [facility] for a CRT-D consult. They should reach out to you within 1-2 weeks. If they do not, please reach out to them. Their information is below on this AVS. They are also located in this building on the Lower Level.   Follow-Up in: 2 months with Dr. Alease Amend.  Our Doctors' schedules are NOT open yet for 2 months. We will place you on our recall list. Once they are available, we will call you to schedule your follow up appointment.   At the Advanced Heart Failure Clinic, you and your health needs are our priority. We have a designated team specialized in the treatment of Heart Failure. This Care Team includes your primary Heart Failure Specialized Cardiologist (physician), Advanced Practice Providers (APPs- Physician Assistants and Nurse Practitioners), and Pharmacist who all work together to provide you with the care you need, when you need it.   You may see any of the following providers on your designated Care Team at your next follow up:  Dr. Jules Oar Dr. Peder Bourdon Dr. Alwin Baars Dr. Judyth Nunnery Shawnee Dellen, FNP Bevely Brush, RPH-CPP  Please be sure to bring in all your medications bottles to every appointment.   Need to Contact Us :  If you have any questions or concerns before your next appointment please send us  a message through [place] or call our office at [phone].    TO LEAVE A MESSAGE FOR THE NURSE SELECT OPTION 2, PLEASE LEAVE A MESSAGE INCLUDING: YOUR NAME DATE OF BIRTH CALL BACK NUMBER REASON FOR CALL**this is important as we prioritize the call backs  YOU WILL RECEIVE A CALL BACK THE SAME DAY AS LONG AS YOU CALL BEFORE 4:00 PM

## 2023-06-03 NOTE — Progress Notes (Unsigned)
 ADVANCED HEART FAILURE FOLLOW UP CLINIC NOTE  Referring Physician: Yehuda Helms, MD  Primary Care: Yehuda Helms, MD Primary Cardiologist:  HPI: Kenneth [name] is a 57 y.o. male with a past medical history of CAD SP PCI to the RCA and OM, uncontrolled diabetes, hyperlipidemia, hypertension, history of CVA, tobacco abuse, HFrEF who presents for follow up of chronic systolic heart failure.      Patient has a longstanding history of cardiac disease.  His cardiomyopathy history dates back to at least 2017, where he had a limited echo showing ejection fraction 45 to 50%.  His EF worsened in late 2017 to 30 to 35%.  He was hospitalized 08/2022 with NSTEMI with further reduction in EF to 20 to 25% and new left bundle branch block.  He underwent PCI to the OM at that time.  Symptoms progressed over the next few months and he was admitted to the hospital 03/2023 for NYHA class IV HF symptoms.  He was initially diuresed but remained symptomatic so right heart catheterization was performed which showed elevated left-sided filling pressures and severely reduced cardiac index of 1.6.  Patient was transferred to [facility] for evaluation for LVAD.  Not a candidate for transplant given recent tobacco use as well as A1c of 12.  He improved rapidly with IV milrinone .  However, his course was complicated by issues with insurance.  Despite multiple attempts, patient did not qualify for Medicaid and was unable to obtain [employer].  This was in spite of multiple social work consultations, appreciate their extensive assistance with this case.  Patient was considered for CRT-D given his wide left bundle branch block.  However, given ongoing appeals to [employer] we elected to hold off at this time.  Patient was weaned off milrinone  successfully and discharged in stable condition.     SUBJECTIVE:  Patient overall reports that he has been doing well.  He is a very active person and  has found it difficult to stay away from his job.  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  He denies any worsening shortness of breath, orthopnea, PND, lower extremity swelling.  His weight is up since discharge, but he reports that is because he has been eating better.  He denies any recent lower extremity swelling.  Still waiting on insurance appeals, no room for titration of medication.  PMH, current medications, allergies, social history, and family history reviewed in epic.  PHYSICAL EXAM: Vitals:   06/03/23 1412  BP: 118/70  Pulse: 78  SpO2: 100%   GENERAL: Well nourished and in no apparent distress at rest.  PULM:  Normal work of breathing, clear to auscultation bilaterally. Respirations are unlabored.  CARDIAC:  JVP: Flat         Normal rate with regular rhythm. No murmurs, rubs or gallops.  No lower extremity edema. Warm and well perfused extremities. ABDOMEN: Soft, non-tender, non-distended. NEUROLOGIC: Patient is oriented x3 with no focal or lateralizing neurologic deficits.    DATA REVIEW  ECG: 04/12/23: NSR, LBBB    ECHO: 04/18/23: LVEF 20-25%, normal RV, no LV thrombus   CATH: 09/04/2022: CTO of the RCA with [place] to collateralization, PCI to 100% occluded OM1, LAD with minor irregularities  Heart failure review: - Classification: Heart failure with reduced EF - Etiology: Ischemic and Cardiomyopathy due to LBBB - NYHA Class: III - Volume status: Euvolemic - ACEi/ARB/ARNI: Currently up-titrating - Aldosterone antagonist:  Maximally tolerated dose - Beta-blocker: Intolerant - Digoxin : Maximally tolerated dose - Hydralazine /Nitrates: Not indicated - SGLT2i: Intolerant - GLP-1: Consider in future - Advanced therapies: Actively working up - ICD: Advanced therapies evaluation  ASSESSMENT & PLAN:  Chronic systolic CHF: Primarily ischemic cardiomyopathy, though likely with some element due to LBBB.  Recent  admission for ACC/AHA stage D cardiomyopathy, NYHA Class IV. Planned LVAD, but delayed due to inusrance as above. Weaned off milrinone  with improvement of symptoms. Insurance appeal ongoing, otherwise will try to bridge to open enrollement, if A1c improved could consider transplant at that time.    - Euvolemic - Continue digoxin  0.125 daily.  - Continue entresto  24/26mg  BID - Continue spironolactone  25 daily.  - Holding SGLT-2 with diabetes, can consider in the future - Hold Toprol  XL with low output.  - PFTs okay - Insurance major barrier to VAD implant at this point.  - If insurance denies can consider CRT-D as he would have time to recover prior to implant, discuss at next visit   2. LV thrombus: Resolved on imaging, function still reduced so conitnue OAC. - Continue apixaban  5mg  BID, aspirin  81mg  daily   3. PVCs/NSVT: Patient noted to have frequent PVCs on telemetry while inpatient. - On amiodarone  200 mg po bid.  - Consider weaning off if CRT-D placed now that off milrinone    4. CAD: Remote PCI to RCA, cath in 8/24 showed chronically occluded proximal RCA.  NSTEMI in 8/24 with occluded OM1 treated with DES.  LAD with minimal disease.  No anginal symptoms. - He is now off Plavix  (> 6 months post PCI).  - Continue ASA 81, apixaban  - Off aspirin  in 08/2023 - Continue statin.    5. DM2: Poor control, hgbA1c 12>>11.6.   - Instruced to reach out to PCP for insulin  refills   6. Smoking: He has quit. -Last cigarette in January 2025  7. Prior CVA:  - No deficits  Follow up in 1 month  Arta Lark, MD Advanced Heart Failure Mechanical Circulatory Support 06/03/23

## 2023-06-05 NOTE — Telephone Encounter (Signed)
 error

## 2023-06-09 NOTE — Progress Notes (Signed)
 Electrophysiology Office Note:   Date:  06/11/2023  ID:  Kenneth [name], DOB January 13, 1966, MRN [id number]  Primary Cardiologist: None Electrophysiologist: Ardeen Kohler, MD      History of Present Illness:   Kenneth [name] is a 58 y.o. male with h/o CAD s/p PCI to the RCA and OM, uncontrolled diabetes, hyperlipidemia, hypertension, history of CVA, tobacco abuse, and chronic systolic heart failure who is being seen today for evaluation for CRT-D implant at the request of Dr. Alease Amend.  Discussed the use of AI scribe software for clinical note transcription with the patient, who gave verbal consent to proceed.  History of Present Illness Kenneth [name] is a 58 year old male with coronary artery disease and left bundle branch block who presents for evaluation of CRT-D candidacy. He was referred by Dr. Alease Amend for evaluation of CRT-D candidacy. He has a history of coronary artery disease, leading to weakened heart function, and a left bundle branch block causing dyssynchrony in the heart's main pumping chamber. He was previously being considered for a left ventricular assist device (LVAD), but there have been delays due to insurance issues. He is experiencing issues with diabetes management. Previously on metformin , he was switched to insulin  following a recent hospitalization at [facility]. This change has resulted in significant fluctuations in blood sugar levels, including episodes of hypoglycemia. He experiences nocturnal hypoglycemia, requiring intervention with food and drink to stabilize. He has done well managing his volume status. No issues with fluid retention or breathing difficulties. He is able to lay flat in bed at night without discomfort. He recently had a tooth extraction, which temporarily affected his sleep pattern. Otherwise no new or acute complaints.   Review of systems complete and found to be negative unless listed in HPI.   EP Information / Studies Reviewed:    EKG is not ordered today.  EKG from 04/11/23 reviewed which showed sinus with LBBB, QRS .      Cardiac MRI 04/15/23: IMPRESSION: 1. Severely dilated LV. Global hypokinesis with septal-lateral dyssynchrony consistent with LBBB, inferior akinesis also noted. LV EF 20%.   2.  Normal RV size with RV EF 34%.   3. LGE in the basal to mid inferior wall and the mid inferolateral wall suggestive of prior subendocardial MI. There is also subtle mid-wall LGE in the basal to mid septum. This can be nonspecific and seen with dilated cardiomyopathy with volume overload. I do not think that MI-type scar can explain the degree of cardiomyopathy.   4. Mildly elevated extracellular volume percentage suggestive of elevated myocardial fibrotic content (can be explained by area of subendocardial MI).  Physical Exam:   VS:  BP 118/72 (BP Location: Left Arm, Patient Position: Sitting, Cuff Size: Normal)   Pulse 75   Ht 5\' 9"  (1.753 m)   Wt 163 lb 12.8 oz (74.3 kg)   BMI 24.19 kg/m    Wt Readings from Last 3 Encounters:  06/11/23 163 lb 12.8 oz (74.3 kg)  06/03/23 163 lb (73.9 kg)  05/07/23 170 lb (77.1 kg)     GEN: Well nourished, well developed in no acute distress NECK: No JVD CARDIAC: Normal rate, regular rhythm RESPIRATORY:  Clear to auscultation without rales, wheezing or rhonchi  ABDOMEN: Soft, non-distended EXTREMITIES:  No edema; No deformity   ASSESSMENT AND PLAN:    #. Chronic systolic heart failure: LVEF 20%. There is dyssnchrony noted on his MRI and echo. At least some component of LBBB induced cardiomyopathy. Also has CAD  and some scare on MRI so ischemic component as well, but degree of scar is felt to not be sufficient enough to explain his degree of cardiomyopathy. Unclear which is the major driver of his CHF, but reasonable to believe we can have improvement with CRT. He is NYHA class III.  #. LBBB: Typical appearing. Development of LBBB based on ECGs does correlate with drop in LVEF.  - Continue  GDMT regimen Entresto  24/26 BID, spironolactone  25mg  daily for now. Follow up with Dr. Alease Amend.  - Explained risks, benefits, and alternatives to CRT-D implantation, including but not limited to bleeding, infection, damage to heart or lungs, heart attack, stroke, or death.  Pt verbalized understanding and agrees to proceed if indicated.    #. H/o LV thrombus: Resolved on imaging, but continued on OAC d/t low LVEF. - We will try to minimize interruption in Eliquis  for CRT-D implant. Hold for 48 hours prior.    #. PVCs/NSVT: Patient noted to have frequent PVCs on telemetry while inpatient. - Continue on amiodarone  200 mg BID for now.  - Will monitor burden with ICD.    #. CAD: Remote PCI to RCA, cath in 8/24 showed chronically occluded proximal RCA.  NSTEMI in 8/24 with occluded OM1 treated with DES.  LAD with minimal disease.  Denies chest pain. - Continue ASA 81mg  BID and atorvastatin  80mg  daily.   #. DM2: Poor control, HgbA1c 11.6. Much improved on continuous glucose monitor with regards to hyperglycemia but now experiencing occasional hypoglycemia, usually nocturnal.   - Will communicate with his PCP, Dr. Claudius Cumins, at patient's request to see if we can expedite follow up for diabetes management either with him or Endocrinology. Will need strict glucose control after ICD implant to avoid infection. Emphasized this to patient and he voiced understanding.   Follow up with Dr. Daneil Dunker 3 months after CRT-D.   Signed, Ardeen Kohler, MD

## 2023-06-10 NOTE — Telephone Encounter (Signed)
 Patient was approved to receive Eliquis  from [employer] Effective 06/10/2023 to 06/08/2024

## 2023-06-11 ENCOUNTER — Ambulatory Visit: Payer: Self-pay | Attending: Cardiology | Admitting: Cardiology

## 2023-06-11 VITALS — BP 118/72 | HR 75 | Ht 69.0 in | Wt 163.8 lb

## 2023-06-11 DIAGNOSIS — I513 Intracardiac thrombosis, not elsewhere classified: Secondary | ICD-10-CM

## 2023-06-11 DIAGNOSIS — I5042 Chronic combined systolic (congestive) and diastolic (congestive) heart failure: Secondary | ICD-10-CM

## 2023-06-11 DIAGNOSIS — I447 Left bundle-branch block, unspecified: Secondary | ICD-10-CM

## 2023-06-11 DIAGNOSIS — I4729 Other ventricular tachycardia: Secondary | ICD-10-CM

## 2023-06-11 DIAGNOSIS — I493 Ventricular premature depolarization: Secondary | ICD-10-CM

## 2023-06-11 DIAGNOSIS — I2510 Atherosclerotic heart disease of native coronary artery without angina pectoris: Secondary | ICD-10-CM

## 2023-06-11 NOTE — Patient Instructions (Signed)
 Medication Instructions:  Your physician recommends that you continue on your current medications as directed. Please refer to the Current Medication list given to you today.  *If you need a refill on your cardiac medications before your next appointment, please call your pharmacy*  Lab Work: BMET and CBC - please have these done within 30 days of your procedure  Testing/Procedures: ICD Implant Your physician has recommended that you have a defibrillator inserted. An implantable cardioverter defibrillator (ICD) is a small device that is placed in your chest or, in rare cases, your abdomen. This device uses electrical pulses or shocks to help control life-threatening, irregular heartbeats that could lead the heart to suddenly stop beating (sudden cardiac arrest). Leads are attached to the ICD that goes into your heart. This is done in the hospital and usually requires an overnight stay.  Please see the instruction sheet given to you today for more information.  Follow-Up: At [facility], you and your health needs are our priority.  As part of our continuing mission to provide you with exceptional heart care, our providers are all part of one team.  This team includes your primary Cardiologist (physician) and Advanced Practice Providers or APPs (Physician Assistants and Nurse Practitioners) who all work together to provide you with the care you need, when you need it.  Your next appointment:   We will contact you to arrange your post-procedure appointments

## 2023-06-19 ENCOUNTER — Other Ambulatory Visit (HOSPITAL_COMMUNITY): Payer: Self-pay

## 2023-06-19 ENCOUNTER — Other Ambulatory Visit: Payer: Self-pay

## 2023-07-10 ENCOUNTER — Other Ambulatory Visit: Payer: Self-pay

## 2023-07-10 DIAGNOSIS — I5042 Chronic combined systolic (congestive) and diastolic (congestive) heart failure: Secondary | ICD-10-CM

## 2023-07-11 ENCOUNTER — Ambulatory Visit: Payer: Self-pay

## 2023-07-11 ENCOUNTER — Encounter: Payer: Self-pay | Admitting: Emergency Medicine

## 2023-07-11 LAB — CBC WITH DIFFERENTIAL/PLATELET
Basophils Absolute: 0.1 10*3/uL (ref 0.0–0.2)
Basos: 1 %
EOS (ABSOLUTE): 0.2 10*3/uL (ref 0.0–0.4)
Eos: 2 %
Hematocrit: 45.7 % (ref 37.5–51.0)
Hemoglobin: 15.2 g/dL (ref 13.0–17.7)
Immature Grans (Abs): 0 10*3/uL (ref 0.0–0.1)
Immature Granulocytes: 0 %
Lymphocytes Absolute: 2.1 10*3/uL (ref 0.7–3.1)
Lymphs: 18 %
MCH: 30.6 pg (ref 26.6–33.0)
MCHC: 33.3 g/dL (ref 31.5–35.7)
MCV: 92 fL (ref 79–97)
Monocytes Absolute: 0.8 10*3/uL (ref 0.1–0.9)
Monocytes: 7 %
Neutrophils Absolute: 8.6 10*3/uL — ABNORMAL HIGH (ref 1.4–7.0)
Neutrophils: 72 %
Platelets: 349 10*3/uL (ref 150–450)
RBC: 4.97 x10E6/uL (ref 4.14–5.80)
RDW: 14.4 % (ref 11.6–15.4)
WBC: 11.8 10*3/uL — ABNORMAL HIGH (ref 3.4–10.8)

## 2023-07-11 LAB — BASIC METABOLIC PANEL WITH GFR
BUN/Creatinine Ratio: 15 (ref 9–20)
BUN: 19 mg/dL (ref 6–24)
CO2: 19 mmol/L — ABNORMAL LOW (ref 20–29)
Calcium: 9.3 mg/dL (ref 8.7–10.2)
Chloride: 102 mmol/L (ref 96–106)
Creatinine, Ser: 1.25 mg/dL (ref 0.76–1.27)
Glucose: 125 mg/dL — ABNORMAL HIGH (ref 70–99)
Potassium: 4.4 mmol/L (ref 3.5–5.2)
Sodium: 140 mmol/L (ref 134–144)
eGFR: 67 mL/min/{1.73_m2} (ref >59)

## 2023-07-17 ENCOUNTER — Ambulatory Visit (HOSPITAL_COMMUNITY): Payer: Self-pay

## 2023-07-17 ENCOUNTER — Other Ambulatory Visit: Payer: Self-pay

## 2023-07-17 ENCOUNTER — Ambulatory Visit (HOSPITAL_COMMUNITY)
Admission: RE | Admit: 2023-07-17 | Discharge: 2023-07-17 | Disposition: A | Discharge status: Home / Self Care | Payer: MEDICAID | Attending: Cardiology | Admitting: Cardiology

## 2023-07-17 ENCOUNTER — Encounter (HOSPITAL_COMMUNITY)
Admission: RE | Disposition: A | Discharge status: Home / Self Care | Payer: Self-pay | Source: Home / Self Care | Attending: Cardiology

## 2023-07-17 DIAGNOSIS — Z79899 Other long term (current) drug therapy: Secondary | ICD-10-CM | POA: Insufficient documentation

## 2023-07-17 DIAGNOSIS — E785 Hyperlipidemia, unspecified: Secondary | ICD-10-CM | POA: Insufficient documentation

## 2023-07-17 DIAGNOSIS — I110 Hypertensive heart disease with heart failure: Secondary | ICD-10-CM | POA: Insufficient documentation

## 2023-07-17 DIAGNOSIS — I472 Ventricular tachycardia, unspecified: Secondary | ICD-10-CM | POA: Insufficient documentation

## 2023-07-17 DIAGNOSIS — Z8673 Personal history of transient ischemic attack (TIA), and cerebral infarction without residual deficits: Secondary | ICD-10-CM | POA: Insufficient documentation

## 2023-07-17 DIAGNOSIS — Z794 Long term (current) use of insulin: Secondary | ICD-10-CM | POA: Insufficient documentation

## 2023-07-17 DIAGNOSIS — I2510 Atherosclerotic heart disease of native coronary artery without angina pectoris: Secondary | ICD-10-CM | POA: Insufficient documentation

## 2023-07-17 DIAGNOSIS — Z7901 Long term (current) use of anticoagulants: Secondary | ICD-10-CM | POA: Insufficient documentation

## 2023-07-17 DIAGNOSIS — I429 Cardiomyopathy, unspecified: Secondary | ICD-10-CM | POA: Insufficient documentation

## 2023-07-17 DIAGNOSIS — Z87891 Personal history of nicotine dependence: Secondary | ICD-10-CM | POA: Insufficient documentation

## 2023-07-17 DIAGNOSIS — Z955 Presence of coronary angioplasty implant and graft: Secondary | ICD-10-CM | POA: Insufficient documentation

## 2023-07-17 DIAGNOSIS — I447 Left bundle-branch block, unspecified: Secondary | ICD-10-CM

## 2023-07-17 DIAGNOSIS — I5022 Chronic systolic (congestive) heart failure: Secondary | ICD-10-CM

## 2023-07-17 DIAGNOSIS — I493 Ventricular premature depolarization: Secondary | ICD-10-CM | POA: Insufficient documentation

## 2023-07-17 DIAGNOSIS — E11649 Type 2 diabetes mellitus with hypoglycemia without coma: Secondary | ICD-10-CM | POA: Insufficient documentation

## 2023-07-17 HISTORY — PX: BIV ICD INSERTION CRT-D: EP1195

## 2023-07-17 LAB — GLUCOSE, CAPILLARY: Glucose-Capillary: 133 mg/dL — ABNORMAL HIGH (ref 70–99)

## 2023-07-17 SURGERY — BIV ICD INSERTION CRT-D

## 2023-07-17 MED ORDER — FENTANYL CITRATE (PF) 100 MCG/2ML IJ SOLN
INTRAMUSCULAR | Status: AC
  Filled 2023-07-17: qty 2

## 2023-07-17 MED ORDER — CHLORHEXIDINE GLUCONATE 4 % EX SOLN
4.0000 | Freq: Once | CUTANEOUS | Status: AC
Start: 2023-07-17 — End: 2023-07-17
  Administered 2023-07-17: 4 via TOPICAL

## 2023-07-17 MED ORDER — MIDAZOLAM HCL 5 MG/5ML IJ SOLN
INTRAMUSCULAR | Status: DC | PRN
  Administered 2023-07-17: .5 mg via INTRAVENOUS
  Administered 2023-07-17 (×2): 1 mg via INTRAVENOUS

## 2023-07-17 MED ORDER — FENTANYL CITRATE (PF) 100 MCG/2ML IJ SOLN
INTRAMUSCULAR | Status: DC | PRN
  Administered 2023-07-17 (×2): 50 ug via INTRAVENOUS
  Administered 2023-07-17: 12.5 ug via INTRAVENOUS

## 2023-07-17 MED ORDER — HEPARIN (PORCINE) IN NACL 1000-0.9 UT/500ML-% IV SOLN
INTRAVENOUS | Status: DC | PRN
  Administered 2023-07-17: 500 mL

## 2023-07-17 MED ORDER — SODIUM CHLORIDE 0.9 % IV SOLN: INTRAVENOUS | Status: DC

## 2023-07-17 MED ORDER — ONDANSETRON HCL 4 MG/2ML IJ SOLN: 4.0000 mg | Freq: Four times a day (QID) | INTRAMUSCULAR | Status: DC | PRN

## 2023-07-17 MED ORDER — LIDOCAINE HCL (PF) 1 % IJ SOLN
INTRAMUSCULAR | Status: DC | PRN
  Administered 2023-07-17: 60 mL

## 2023-07-17 MED ORDER — CEFAZOLIN SODIUM-DEXTROSE 2-4 GM/100ML-% IV SOLN
INTRAVENOUS | Status: AC
  Filled 2023-07-17: qty 100

## 2023-07-17 MED ORDER — MIDAZOLAM HCL 5 MG/5ML IJ SOLN
INTRAMUSCULAR | Status: AC
  Filled 2023-07-17: qty 5

## 2023-07-17 MED ORDER — SODIUM CHLORIDE 0.9 % IV SOLN
80.0000 mg | INTRAVENOUS | Status: AC
  Administered 2023-07-17: 80 mg

## 2023-07-17 MED ORDER — IOHEXOL 350 MG/ML SOLN
INTRAVENOUS | Status: DC | PRN
Start: 2023-07-17 — End: 2023-07-18
  Administered 2023-07-17: 5 mL

## 2023-07-17 MED ORDER — POVIDONE-IODINE 10 % EX SWAB
2.0000 | Freq: Once | CUTANEOUS | Status: AC
  Administered 2023-07-17: 2 via TOPICAL

## 2023-07-17 MED ORDER — GENTAMICIN SULFATE 40 MG/ML IJ SOLN
INTRAMUSCULAR | Status: AC
  Filled 2023-07-17: qty 2

## 2023-07-17 MED ORDER — APIXABAN 5 MG PO TABS: 5.0000 mg | ORAL_TABLET | Freq: Two times a day (BID) | ORAL | 5 refills | Status: AC

## 2023-07-17 MED ORDER — FENTANYL CITRATE (PF) 100 MCG/2ML IJ SOLN
INTRAMUSCULAR | Status: AC
Start: 2023-07-17 — End: 2023-07-17
  Filled 2023-07-17: qty 2

## 2023-07-17 MED ORDER — LIDOCAINE HCL 1 % IJ SOLN
INTRAMUSCULAR | Status: AC
  Filled 2023-07-17: qty 60

## 2023-07-17 MED ORDER — ACETAMINOPHEN 325 MG PO TABS: 325.0000 mg | ORAL_TABLET | ORAL | Status: DC | PRN

## 2023-07-17 MED ORDER — CEFAZOLIN SODIUM-DEXTROSE 2-4 GM/100ML-% IV SOLN
2.0000 g | INTRAVENOUS | Status: AC
  Administered 2023-07-17: 2 g via INTRAVENOUS

## 2023-07-17 SURGICAL SUPPLY — 17 items
CABLE SURGICAL S-101-97-12 (CABLE) ×1 IMPLANT
CATH ATTAIN COM SURV 6250V-EH (CATHETERS) IMPLANT
CATH ATTAIN COMMAND 6250-MB2 (CATHETERS) IMPLANT
ICD COBALT XT QUAD CRT DTPA2QQ (ICD Generator) IMPLANT
KIT ESSENTIALS PG (KITS) IMPLANT
LEAD ATTAIN PERFORMA S 4598-88 (Lead) IMPLANT
LEAD CAPSURE NOVUS 5076-52CM (Lead) IMPLANT
LEAD SPRINT QUAT SEC 6935M-62 (Lead) IMPLANT
PAD DEFIB RADIO PHYSIO CONN (PAD) ×1 IMPLANT
SHEATH 7FR PRELUDE SNAP 13 (SHEATH) IMPLANT
SHEATH 9.5FR PRELUDE SNAP 13 (SHEATH) IMPLANT
SHEATH 9FR PRELUDE SNAP 13 (SHEATH) IMPLANT
SHEATH PROBE COVER 6X72 (BAG) IMPLANT
SLITTER 6232ADJ (MISCELLANEOUS) IMPLANT
TRAY PACEMAKER INSERTION (PACKS) ×1 IMPLANT
WIRE ACUITY WHISPER EDS 4648 (WIRE) IMPLANT
WIRE HI TORQ VERSACORE-J 145CM (WIRE) IMPLANT

## 2023-07-17 NOTE — H&P (Signed)
 Electrophysiology Note:   Date:  07/17/23  ID:  Kenneth [name], DOB 17-Apr-1965, MRN [id number]   Primary Cardiologist: None Electrophysiologist: Fonda Kitty, MD       History of Present Illness:   Kenneth [name] is a 58 y.o. male with h/o CAD s/p PCI to the RCA and OM, uncontrolled diabetes, hyperlipidemia, hypertension, history of CVA, tobacco abuse, and chronic systolic heart failure who is being seen today for evaluation for CRT-D implant at the request of Dr. Zenaida.   Discussed the use of AI scribe software for clinical note transcription with the patient, who gave verbal consent to proceed.   History of Present Illness Kenneth [name] is a 58 year old male with coronary artery disease and left bundle branch block who presents for evaluation of CRT-D candidacy. He was referred by Dr. Zenaida for evaluation of CRT-D candidacy. He has a history of coronary artery disease, leading to weakened heart function, and a left bundle branch block causing dyssynchrony in the heart's main pumping chamber. He was previously being considered for a left ventricular assist device (LVAD), but there have been delays due to insurance issues. He is experiencing issues with diabetes management. Previously on metformin , he was switched to insulin  following a recent hospitalization at [facility]. This change has resulted in significant fluctuations in blood sugar levels, including episodes of hypoglycemia. He experiences nocturnal hypoglycemia, requiring intervention with food and drink to stabilize. He has done well managing his volume status. No issues with fluid retention or breathing difficulties. He is able to lay flat in bed at night without discomfort. He recently had a tooth extraction, which temporarily affected his sleep pattern. Otherwise no new or acute complaints.   Interval: Patient presents today for scheduled CRT-D implant. Reports feeling relatively well. No new or acute complaints today.    Review of  systems complete and found to be negative unless listed in HPI.    EP Information / Studies Reviewed:     EKG is not ordered today. EKG from 04/11/23 reviewed which showed sinus with LBBB, QRS .       Cardiac MRI 04/15/23: IMPRESSION: 1. Severely dilated LV. Global hypokinesis with septal-lateral dyssynchrony consistent with LBBB, inferior akinesis also noted. LV EF 20%.   2.  Normal RV size with RV EF 34%.   3. LGE in the basal to mid inferior wall and the mid inferolateral wall suggestive of prior subendocardial MI. There is also subtle mid-wall LGE in the basal to mid septum. This can be nonspecific and seen with dilated cardiomyopathy with volume overload. I do not think that MI-type scar can explain the degree of cardiomyopathy.   4. Mildly elevated extracellular volume percentage suggestive of elevated myocardial fibrotic content (can be explained by area of subendocardial MI).   Physical Exam:    Today's Vitals   07/17/23 1226 07/17/23 1338  BP: 111/64   Pulse: 66   Resp: 16   Temp: 99 F (37.2 C)   TempSrc: Oral   SpO2: 96%   Weight: 75.8 kg   Height: 5' 9 (1.753 m)   PainSc:  0-No pain   Body mass index is 24.66 kg/m.  GEN: Well nourished, well developed in no acute distress NECK: No JVD CARDIAC: Normal rate, regular rhythm RESPIRATORY:  Clear to auscultation without rales, wheezing or rhonchi  ABDOMEN: Soft, non-distended EXTREMITIES:  No edema; No deformity    ASSESSMENT AND PLAN:     #. Chronic systolic heart failure: LVEF 20%.  There is dyssnchrony noted on his MRI and echo. At least some component of LBBB induced cardiomyopathy. Also has CAD and some scare on MRI so ischemic component as well, but degree of scar is felt to not be sufficient enough to explain his degree of cardiomyopathy. Unclear which is the major driver of his CHF, but reasonable to believe we can have improvement with CRT. He is NYHA class III.  #. LBBB: Typical appearing.  Development of LBBB based on ECGs does correlate with drop in LVEF.  - Continue GDMT regimen Entresto  24/26 BID, spironolactone  25mg  daily for now. Follow up with Dr. Zenaida.  - Explained risks, benefits, and alternatives to CRT-D implantation, including but not limited to bleeding, infection, damage to heart or lungs, heart attack, stroke, or death.  Pt verbalized understanding and agrees to proceed if indicated.    #. H/o LV thrombus: Resolved on imaging, but continued on OAC d/t low LVEF. - We will try to minimize interruption in Eliquis  for CRT-D implant. Hold for 48 hours prior.    #. PVCs/NSVT: Patient noted to have frequent PVCs on telemetry while inpatient. - Continue on amiodarone  200 mg BID for now.  - Will monitor burden with ICD.     Follow up with Dr. Kennyth 3 months after CRT-D.    Signed, Fonda Kennyth, MD

## 2023-07-17 NOTE — Discharge Instructions (Addendum)
 Resume Eliquis  on the morning of 6/28. After Your ICD (Implantable Cardiac Defibrillator)   You have a [employer] ICD  ACTIVITY Do not lift your arm above shoulder height for 1 week after your procedure. After 7 days, you may progress as below.  You should remove your sling 24 hours after your procedure, unless otherwise instructed by your provider.     Wednesday July 24, 2023  Thursday July 25, 2023 Friday July 26, 2023 Saturday July 27, 2023   Do not lift, push, pull, or carry anything over 10 pounds with the affected arm until 6 weeks (Wednesday August 28, 2023 ) after your procedure.   You may drive AFTER your wound check, unless you have been told otherwise by your provider.   Ask your healthcare provider when you can go back to work   INCISION/Dressing If you are on a blood thinner such as Coumadin , Xarelto, Eliquis , Plavix , or Pradaxa please confirm with your provider when this should be resumed.   If large square, outer bandage is left in place, this can be removed after 24 hours from your procedure. Do not remove steri-strips or glue as below.   Monitor your defibrillator site for redness, swelling, and drainage. Call the device clinic at [phone] if you experience these symptoms or fever/chills.  If your incision is sealed with Steri-strips or staples, you may shower 7 days after your procedure or when told by your provider. Do not remove the steri-strips or let the shower hit directly on your site. You may wash around your site with soap and water.    If you were discharged in a sling, please do not wear this during the day more than 48 hours after your surgery unless otherwise instructed. This may increase the risk of stiffness and soreness in your shoulder.   Avoid lotions, ointments, or perfumes over your incision until it is well-healed.  You may use a hot tub or a pool AFTER your wound check appointment if the incision is completely closed.  Your ICD is designed to  protect you from life threatening heart rhythms. Because of this, you may receive a shock.   1 shock with no symptoms:  Call the office during business hours. 1 shock with symptoms (chest pain, chest pressure, dizziness, lightheadedness, shortness of breath, overall feeling unwell):  Call 911. If you experience 2 or more shocks in 24 hours:  Call 911. If you receive a shock, you should not drive for 6 months per the [place] DMV IF you receive appropriate therapy from your ICD.   ICD Alerts:  Some alerts are vibratory and others beep. These are NOT emergencies. Please call our office to let us  know. If this occurs at night or on weekends, it can wait until the next business day. Send a remote transmission.  If your device is capable of reading fluid status (for heart failure), you will be offered monthly monitoring to review this with you.   DEVICE MANAGEMENT Remote monitoring is used to monitor your ICD from home. This monitoring is scheduled every 91 days by our office. It allows us  to keep an eye on the functioning of your device to ensure it is working properly. You will routinely see your Electrophysiologist annually (more often if necessary).   You should receive your ID card for your new device in 4-8 weeks. Keep this card with you at all times once received. Consider wearing a medical alert bracelet or necklace.  Your ICD  may be MRI compatible.  This will be discussed at your next office visit/wound check.  You should avoid contact with strong electric or magnetic fields.   Do not use amateur (ham) radio equipment or electric (arc) welding torches. MP3 player headphones with magnets should not be used. Some devices are safe to use if held at least 12 inches (30 cm) from your defibrillator. These include power tools, lawn mowers, and speakers. If you are unsure if something is safe to use, ask your health care provider.  When using your cell phone, hold it to the ear that is on the opposite side  from the defibrillator. Do not leave your cell phone in a pocket over the defibrillator.  You may safely use electric blankets, heating pads, computers, and microwave ovens.  Call the office right away if: You have chest pain. You feel more than one shock. You feel more short of breath than you have felt before. You feel more light-headed than you have felt before. Your incision starts to open up.  This information is not intended to replace advice given to you by your health care provider. Make sure you discuss any questions you have with your health care provider.

## 2023-07-18 ENCOUNTER — Encounter (HOSPITAL_COMMUNITY): Payer: Self-pay | Admitting: Cardiology

## 2023-07-19 ENCOUNTER — Telehealth: Payer: Self-pay

## 2023-07-19 ENCOUNTER — Other Ambulatory Visit (HOSPITAL_COMMUNITY): Payer: Self-pay

## 2023-07-19 ENCOUNTER — Other Ambulatory Visit: Payer: Self-pay

## 2023-07-19 NOTE — Telephone Encounter (Signed)
 Follow-up after same day discharge: Implant date: 07/17/2023 MD: JP  Device: ICD MDT  Location: L chest   Wound check visit: yes 90 day MD follow-up: yes  Remote Transmission received:yes  Dressing/sling removed: n/a  Confirm OAC restart on: LVM for pt to call back   Please continue to monitor your cardiac device site for redness, swelling, and drainage. Call the device clinic at [phone] if you experience these symptoms, fever/chills, or have questions about your device.   Remote monitoring is used to monitor your cardiac device from home. This monitoring is scheduled every 91 days by our office. It allows us  to keep an eye on the functioning of your device to ensure it is working properly.

## 2023-07-19 NOTE — Telephone Encounter (Signed)
 Spoke with pt he is feeling well and was given the device clinic number if he has any questions

## 2023-07-22 ENCOUNTER — Other Ambulatory Visit: Payer: Self-pay

## 2023-08-01 ENCOUNTER — Ambulatory Visit: Payer: Self-pay

## 2023-08-06 ENCOUNTER — Ambulatory Visit: Payer: Self-pay | Attending: Cardiology

## 2023-08-06 DIAGNOSIS — I5042 Chronic combined systolic (congestive) and diastolic (congestive) heart failure: Secondary | ICD-10-CM

## 2023-08-06 NOTE — Progress Notes (Signed)
 Normal Bi-V ICD wound check. Wound well healed. Presenting rhythm: AS/BV-71. Routine testing performed. Thresholds, sensing, and impedances consistent with implant measurements with 3.5V safety margin/auto capture until 3 month visit. No treated arrhythmias. Reviewed arm restrictions to continue for 6 weeks total post op. Reviewed shock plan.  Pt enrolled in remote follow-up.

## 2023-08-06 NOTE — Patient Instructions (Signed)

## 2023-08-07 ENCOUNTER — Telehealth: Payer: Self-pay | Admitting: Licensed Clinical Social Worker

## 2023-08-07 NOTE — Progress Notes (Unsigned)
 Heart and Vascular Care Navigation  08/07/2023  Kenneth [name] 05-29-1965 [id number]  Reason for Referral: self pay Patient is participating in a Managed Medicaid Plan: No, not eligible  Engaged with patient by telephone for initial visit for Heart and Vascular Care Coordination.                                                                                                   Assessment:                                     LCSW was able to reach pt wife at preferred number, DPR on file for [employer]. Introduced self, role, reason for call. Confirmed home address, PCP, and pt currently with pending disability application. Per notes pt was denied for Medicaid due to assets at this time. We discussed assistance through [employer] for medical bills and [facility] for assistance with pt generic medications. Pt wife very interested in these and able to assist with completing. They are okay with it being sent to home address. LCSW inquired about transportation, housing, utilities and food- share they are doing okay at this time. Encouraged them to call if any other resources needed.   HRT/VAS Care Coordination     Patients Home Cardiology Office --  [facility]   Outpatient Care Team Social Worker   Social Worker Name: Marit Lark, [place], [phone]   Living arrangements for the past 2 months Single Family Home   Lives with: Spouse   Patient Current Insurance Coverage Self-Pay   Patient Has Concern With Paying Medical Bills Yes   Patient Concerns With Medical Bills self pay, not medicaid eligible   Medical Bill Referrals: [facility]   Does Patient Have Prescription Coverage? No   Patient Prescription Assistance Programs [place] [facility]   Home Assistive Devices/Equipment Eyeglasses       Social History:                                                                             SDOH Screenings   Food Insecurity: Food Insecurity Present (08/07/2023)  Housing: Low Risk   (08/07/2023)  Recent Concern: Housing - High Risk (07/02/2023)   Received from [facility]  Transportation Needs: No Transportation Needs (08/07/2023)  Utilities: Not At Risk (08/07/2023)  Recent Concern: Utilities - At Risk (07/02/2023)   Received from [facility]  Financial Resource Strain: High Risk (08/07/2023)  Social Connections: Unknown (04/09/2023)  Tobacco Use: High Risk (07/30/2023)   Received from [facility]  Health Literacy: Adequate Health Literacy (08/07/2023)    SDOH Interventions: Financial Resources:  Financial Strain Interventions: [employer] Provided, [place] DSS for financial assistance and Financial Counseling  for [employer] Health Discount Program  Food Insecurity:  Food Insecurity Interventions: [employer] Provided  Housing Insecurity:  Housing Interventions: Intervention Not Indicated  Transportation:   Transportation Interventions: Intervention Not Indicated     Other Care Navigation Interventions:     Provided Pharmacy assistance resources [place] [facility]   Follow-up plan:   LCSW has sent pt the following: my card, [employer] application, Haematologist. Will f/u to ensure received and answer any other questions.

## 2023-08-15 ENCOUNTER — Other Ambulatory Visit (HOSPITAL_COMMUNITY): Payer: Self-pay

## 2023-08-15 ENCOUNTER — Other Ambulatory Visit: Payer: Self-pay

## 2023-08-16 ENCOUNTER — Other Ambulatory Visit (HOSPITAL_COMMUNITY): Payer: Self-pay

## 2023-08-16 ENCOUNTER — Other Ambulatory Visit (HOSPITAL_COMMUNITY): Payer: Self-pay | Admitting: Physician Assistant

## 2023-08-19 ENCOUNTER — Other Ambulatory Visit (HOSPITAL_COMMUNITY): Payer: Self-pay

## 2023-08-19 ENCOUNTER — Other Ambulatory Visit: Payer: Self-pay

## 2023-08-20 ENCOUNTER — Telehealth: Payer: Self-pay | Admitting: Licensed Clinical Social Worker

## 2023-08-20 NOTE — Telephone Encounter (Signed)
 H&V Care Navigation CSW Progress Note   Clinical Social Worker contacted caregiver (wife Sharlet, [place] on file) by phone at [phone] to f/u on assistance applications mailed to pt home. Was able to confirm she received them. Is working on filling out needed information, no questions at this time. Remain available as needed for any questions. Pt wife encouraged to call me when application complete/documents gathered and I can help her with submitting or reviewing applications.  Patient is participating in a Managed Medicaid Plan:  No, self pay only   SDOH Screenings   Food Insecurity: Food Insecurity Present (08/07/2023)  Housing: Low Risk  (08/07/2023)  Recent Concern: Housing - High Risk (07/02/2023)   Received from [facility]  Transportation Needs: No Transportation Needs (08/07/2023)  Utilities: Not At Risk (08/07/2023)  Recent Concern: Utilities - At Risk (07/02/2023)   Received from [facility]  Financial Resource Strain: High Risk (08/07/2023)  Social Connections: Unknown (04/09/2023)  Tobacco Use: High Risk (08/15/2023)   Received from [facility]  Health Literacy: Adequate Health Literacy (08/07/2023)    Marit Lark, MSW, LCSW Clinical Social Worker II [facility] Heart/Vascular Care Navigation  [phone]- work cell phone (preferred)

## 2023-08-26 ENCOUNTER — Other Ambulatory Visit (HOSPITAL_COMMUNITY): Payer: Self-pay

## 2023-08-28 ENCOUNTER — Telehealth: Payer: Self-pay | Admitting: Licensed Clinical Social Worker

## 2023-08-28 NOTE — Telephone Encounter (Signed)
 H&V Care Navigation CSW Progress Note  Clinical Social Worker contacted caregiver (wife Sharlet, [place] on file) by phone at [phone] to f/u on assistance applications mailed to pt home. No additional questions at this time. Remain available as needed for any questions. Pt wife encouraged to call me when application complete/documents gathered and I can help her with submitting or reviewing applications. Updated her regarding upcoming time off, if needed my colleague's number will be on my voicemail.    Patient is participating in a Managed Medicaid Plan:  No, self pay only  SDOH Screenings   Food Insecurity: Food Insecurity Present (08/07/2023)  Housing: Low Risk  (08/07/2023)  Recent Concern: Housing - High Risk (07/02/2023)   Received from [facility]  Transportation Needs: No Transportation Needs (08/07/2023)  Utilities: Not At Risk (08/07/2023)  Recent Concern: Utilities - At Risk (07/02/2023)   Received from [facility]  Financial Resource Strain: High Risk (08/07/2023)  Social Connections: Unknown (04/09/2023)  Tobacco Use: High Risk (08/15/2023)   Received from [facility]  Health Literacy: Adequate Health Literacy (08/07/2023)    Marit Lark, MSW, LCSW Clinical Social Worker II [facility] Heart/Vascular Care Navigation  [phone]- work cell phone (preferred)

## 2023-08-29 ENCOUNTER — Ambulatory Visit: Payer: Self-pay

## 2023-08-29 DIAGNOSIS — I447 Left bundle-branch block, unspecified: Secondary | ICD-10-CM

## 2023-08-29 LAB — CUP PACEART REMOTE DEVICE CHECK
Battery Remaining Longevity: 103 mo
Battery Voltage: 3.1 V
Brady Statistic AP VP Percent: 0.24 %
Brady Statistic AP VS Percent: 0.02 %
Brady Statistic AS VP Percent: 99.45 %
Brady Statistic AS VS Percent: 0.3 %
Brady Statistic RA Percent Paced: 0.26 %
Brady Statistic RV Percent Paced: 99.68 %
Date Time Interrogation Session: 20250806192201
HighPow Impedance: 68 Ohm
Implantable Lead Connection Status: 753985
Implantable Lead Connection Status: 753985
Implantable Lead Connection Status: 753985
Implantable Lead Implant Date: 20250625
Implantable Lead Implant Date: 20250625
Implantable Lead Implant Date: 20250625
Implantable Lead Location: 753858
Implantable Lead Location: 753859
Implantable Lead Location: 753860
Implantable Lead Model: 4598
Implantable Lead Model: 5076
Implantable Pulse Generator Implant Date: 20250625
Lead Channel Impedance Value: 361 Ohm
Lead Channel Impedance Value: 399 Ohm
Lead Channel Impedance Value: 437 Ohm
Lead Channel Impedance Value: 513 Ohm
Lead Channel Impedance Value: 532 Ohm
Lead Channel Impedance Value: 551 Ohm
Lead Channel Impedance Value: 589 Ohm
Lead Channel Impedance Value: 779 Ohm
Lead Channel Impedance Value: 817 Ohm
Lead Channel Impedance Value: 855 Ohm
Lead Channel Impedance Value: 912 Ohm
Lead Channel Impedance Value: 912 Ohm
Lead Channel Impedance Value: 969 Ohm
Lead Channel Pacing Threshold Amplitude: 0.625 V
Lead Channel Pacing Threshold Amplitude: 0.75 V
Lead Channel Pacing Threshold Amplitude: 1.5 V
Lead Channel Pacing Threshold Pulse Width: 0.4 ms
Lead Channel Pacing Threshold Pulse Width: 0.4 ms
Lead Channel Pacing Threshold Pulse Width: 0.4 ms
Lead Channel Sensing Intrinsic Amplitude: 1.5 mV
Lead Channel Sensing Intrinsic Amplitude: 9.5 mV
Lead Channel Setting Pacing Amplitude: 2.5 V
Lead Channel Setting Pacing Amplitude: 3.5 V
Lead Channel Setting Pacing Amplitude: 3.5 V
Lead Channel Setting Pacing Pulse Width: 0.4 ms
Lead Channel Setting Pacing Pulse Width: 0.4 ms
Lead Channel Setting Sensing Sensitivity: 0.3 mV
Zone Setting Status: 755011
Zone Setting Status: 755011
Zone Setting Status: 755011

## 2023-09-04 ENCOUNTER — Ambulatory Visit: Payer: Self-pay | Admitting: Cardiology

## 2023-09-05 ENCOUNTER — Other Ambulatory Visit (HOSPITAL_COMMUNITY): Payer: Self-pay

## 2023-09-06 ENCOUNTER — Other Ambulatory Visit (HOSPITAL_COMMUNITY): Payer: Self-pay

## 2023-09-09 ENCOUNTER — Telehealth: Payer: Self-pay | Admitting: Licensed Clinical Social Worker

## 2023-09-09 NOTE — Telephone Encounter (Signed)
 H&V Care Navigation CSW Progress Note  Clinical Social Worker contacted caregiver (wife Sharlet, [place] on file) by phone at [phone] to f/u on assistance applications mailed to pt home. No answer today, left voicemail. Remain available as needed for any questions. Pt wife encouraged to call me when application complete/documents gathered and I can help her with submitting or reviewing applications.   Patient is participating in a Managed Medicaid Plan:  No, self pay only  SDOH Screenings   Food Insecurity: Food Insecurity Present (08/07/2023)  Housing: Low Risk  (08/07/2023)  Recent Concern: Housing - High Risk (07/02/2023)   Received [facility]  Transportation Needs: No Transportation Needs (08/07/2023)  Utilities: Not At Risk (08/07/2023)  Recent Concern: Utilities - At Risk (07/02/2023)   [facility]  Financial Resource Strain: High Risk (08/07/2023)  Social Connections: Unknown (04/09/2023)  Tobacco Use: High Risk (08/15/2023)   Received from [facility]  Health Literacy: Adequate Health Literacy (08/07/2023)     Marit Lark, MSW, LCSW Clinical Social Worker II [facility] Heart/Vascular Care Navigation  [phone]- work cell phone (preferred)

## 2023-09-11 ENCOUNTER — Telehealth: Payer: Self-pay | Admitting: Licensed Clinical Social Worker

## 2023-09-11 NOTE — Telephone Encounter (Signed)
 H&V Care Navigation CSW Progress Note  Clinical Social Worker contacted caregiver (wife Sharlet, [place] on file) by phone at [phone] to f/u on assistance applications mailed to pt home. Was able to reach pt wife today. Pt wife was again encouraged to call me when application complete/documents gathered and I can help her with submitting or reviewing applications.  Remain available as needed for any questions.    Patient is participating in a Managed Medicaid Plan:  No, self pay only  SDOH Screenings   Food Insecurity: Food Insecurity Present (08/07/2023)  Housing: Low Risk  (08/07/2023)  Recent Concern: Housing - High Risk (07/02/2023)   Received from [facility]  Transportation Needs: No Transportation Needs (08/07/2023)  Utilities: Not At Risk (08/07/2023)  Recent Concern: Utilities - At Risk (07/02/2023)   Received from [facility]  Financial Resource Strain: High Risk (08/07/2023)  Social Connections: Unknown (04/09/2023)  Tobacco Use: High Risk (08/15/2023)   Received from [facility]  Health Literacy: Adequate Health Literacy (08/07/2023)    Marit Lark, MSW, LCSW Clinical Social Worker II [facility] Heart/Vascular Care Navigation  [phone]- work cell phone (preferred)

## 2023-09-16 ENCOUNTER — Other Ambulatory Visit: Payer: Self-pay

## 2023-09-16 ENCOUNTER — Other Ambulatory Visit (HOSPITAL_COMMUNITY): Payer: Self-pay

## 2023-09-27 ENCOUNTER — Telehealth: Payer: Self-pay | Admitting: Licensed Clinical Social Worker

## 2023-09-27 NOTE — Telephone Encounter (Signed)
 H&V Care Navigation CSW Progress Note  Clinical Social Worker completed chart review as no return calls from pt or pt spouse regarding assistance applications. Pt had been contacted on 7/16, 7/29, 8/6, 8/16 and 8/20. No notes indicating applications submitted. Remain available should pt/pt spouse reach out again, will not actively follow at this time.   Patient is participating in a Managed Medicaid Plan:  No, self pay only  SDOH Screenings   Food Insecurity: Food Insecurity Present (08/07/2023)  Housing: Low Risk  (08/07/2023)  Recent Concern: Housing - High Risk (07/02/2023)   Received from [facility]  Transportation Needs: No Transportation Needs (08/07/2023)  Utilities: Not At Risk (08/07/2023)  Recent Concern: Utilities - At Risk (07/02/2023)   Received from [facility]  Financial Resource Strain: High Risk (08/07/2023)  Social Connections: Unknown (04/09/2023)  Tobacco Use: High Risk (08/15/2023)   Received from [facility]  Health Literacy: Adequate Health Literacy (08/07/2023)    Marit Lark, MSW, LCSW Clinical Social Worker II [facility] Heart/Vascular Care Navigation  [phone]- work cell phone (preferred)

## 2023-10-16 ENCOUNTER — Other Ambulatory Visit (HOSPITAL_COMMUNITY): Payer: Self-pay

## 2023-10-18 ENCOUNTER — Ambulatory Visit: Payer: Self-pay | Attending: Cardiology | Admitting: Cardiology

## 2023-10-18 NOTE — Progress Notes (Deleted)
 Electrophysiology Clinic Note    Date:  10/18/2023  Patient ID:  Kenneth, [name] [birth year], MRN [id number] PCP:  Auston Reyes BIRCH, MD  Cardiologist:  Marsa Dooms, MD HF cardiologist - Zenaida  Electrophysiologist:  Fonda Kitty, MD    ***refresh  Discussed the use of AI scribe software for clinical note transcription with the patient, who gave verbal consent to proceed.   Patient Profile    Chief Complaint: ***  History of Present Illness: Kenneth [name] is a 58 y.o. male with PMH notable for CAD s/p PCI to RCA/OM, HFrEF, HTN, HLD, CVA, DM, tobacco abuse ; seen today for Fonda Kitty, MD for routine electrophysiology follow-up s/p BiV Defibrillator implant.  He is s/p CRT-D implant 06/2023 by Dr. Kitty.   On follow-up today, *** fluid status, exercise tolerance  - talk about defib  - update TTE  - chronic leads - needs [facility] appt  Since last being seen in our clinic the patient reports doing ***.  he denies chest pain, palpitations, dyspnea, PND, orthopnea, nausea, vomiting, dizziness, syncope, edema, weight gain, or early satiety.       Arrhythmia/Device History MDT CRT-D, imp 06/2023; dx HFrEF, LBBB    ROS:  Please see the history of present illness. All other systems are reviewed and otherwise negative.    Physical Exam    VS:  There were no vitals taken for this visit. BMI: There is no height or weight on file to calculate BMI.           Wt Readings from Last 3 Encounters:  07/17/23 167 lb (75.8 kg)  06/11/23 163 lb 12.8 oz (74.3 kg)  06/03/23 163 lb (73.9 kg)     GEN- The patient is well appearing, alert and oriented x 3 today.   Lungs- Clear to ausculation bilaterally, normal work of breathing.  Heart- {Blank single:19197::Regular,Irregularly irregular} rate and rhythm, no murmurs, rubs or gallops Extremities- {EDEMA LEVEL:28147::No} peripheral edema, warm, dry Skin-  *** device pocket well-healed, no tethering   Device  interrogation done today and reviewed by myself:  Battery *** Lead thresholds, impedence, sensing stable *** *** episodes *** changes made today   Studies Reviewed   Previous EP, cardiology notes.    EKG is ordered. Personal review of EKG from today shows:  ***        Limited TTE, 04/18/2023  1. Left ventricular ejection fraction, by estimation, is 20 to 25%. The left ventricle has severely decreased function. The left ventricle demonstrates global hypokinesis.   Cardiac MRI, 04/15/2023 1. Severely dilated LV. Global hypokinesis with septal-lateral dyssynchrony consistent with LBBB, inferior akinesis also noted. LV EF 20%.  2.  Normal RV size with RV EF 34%.  3. LGE in the basal to mid inferior wall and the mid inferolateral wall suggestive of prior subendocardial MI. There is also subtle mid-wall LGE in the basal to mid septum. This can be nonspecific and seen with dilated cardiomyopathy with volume overload. I do not think that MI-type scar can explain the degree of cardiomyopathy.  4. Mildly elevated extracellular volume percentage suggestive of elevated myocardial fibrotic content (can be explained by area of subendocardial MI).  RHC, 04/11/2023   Hemodynamic findings consistent with moderate pulmonary hypertension.   1.  Moderate pulmonary hypertension, PA mean 36 mmHg 2.  Pulmonary capillary wedge 26 mmHg 3.  Fick cardiac output 3.19  TTE, 04/09/2023  1. Large LV thrombus noted. Left ventricular ejection fraction, by estimation, is 10-15%%.  The left ventricle has severely decreased function. The left ventricle demonstrates regional wall motion abnormalities (see scoring diagram/findings for description). The left ventricular internal cavity size was severely dilated. Left ventricular diastolic parameters are consistent with Grade II diastolic dysfunction (pseudonormalization).   2. Right ventricular systolic function is mildly reduced. The right ventricular size is mildly  enlarged. There is moderately elevated pulmonary artery systolic pressure. The estimated right ventricular systolic pressure is 45.4 mmHg.   3. Left atrial size was mildly dilated.   4. The mitral valve is normal in structure. Mild mitral valve regurgitation.   5. The aortic valve is tricuspid. There is mild thickening of the aortic valve. Aortic valve regurgitation is trivial.    Assessment and Plan     #) CRT-D   #) HFrEF    #) ***   {Are you ordering a CV Procedure (e.g. stress test, cath, DCCV, TEE, etc)?   Press F2        :[id number]}   Current medicines are reviewed at length with the patient today.   The patient {ACTIONS; HAS/DOES NOT HAVE:19233} concerns regarding his medicines.  The following changes were made today:  {NONE DEFAULTED:18576}  Labs/ tests ordered today include: *** No orders of the defined types were placed in this encounter.    Disposition: Follow up with {EPMDS:28135::EP Team} or EP APP {EPFOLLOW UP:28173}   Signed, Chantal Needle, NP  10/18/23  8:43 AM  Electrophysiology [facility]

## 2023-10-19 NOTE — Progress Notes (Signed)
 Remote ICD Transmission

## 2023-10-28 ENCOUNTER — Other Ambulatory Visit (HOSPITAL_COMMUNITY): Payer: Self-pay | Admitting: Physician Assistant

## 2023-10-28 ENCOUNTER — Other Ambulatory Visit (HOSPITAL_COMMUNITY): Payer: Self-pay

## 2023-10-28 ENCOUNTER — Other Ambulatory Visit: Payer: Self-pay

## 2023-10-28 MED ORDER — AMIODARONE HCL 200 MG PO TABS
200.0000 mg | ORAL_TABLET | Freq: Two times a day (BID) | ORAL | 5 refills | Status: AC
  Filled 2023-10-28 – 2023-12-02 (×4): qty 60, 30d supply, fill #0
  Filled 2023-12-02 – 2023-12-30 (×3): qty 60, 30d supply, fill #1
  Filled 2024-01-14 – 2024-02-04 (×3): qty 60, 30d supply, fill #2
  Filled 2024-02-13: qty 60, 30d supply, fill #3

## 2023-10-28 MED ORDER — PANTOPRAZOLE SODIUM 40 MG PO TBEC
40.0000 mg | DELAYED_RELEASE_TABLET | Freq: Every day | ORAL | 5 refills | Status: AC
  Filled 2023-10-28 – 2023-12-02 (×4): qty 30, 30d supply, fill #0
  Filled 2023-12-02 – 2023-12-30 (×3): qty 30, 30d supply, fill #1
  Filled 2024-01-14 – 2024-02-04 (×3): qty 30, 30d supply, fill #2

## 2023-10-28 MED ORDER — ATORVASTATIN CALCIUM 80 MG PO TABS
80.0000 mg | ORAL_TABLET | Freq: Every day | ORAL | 5 refills | Status: AC
  Filled 2023-10-28 – 2023-10-29 (×3): qty 30, 30d supply, fill #0
  Filled 2023-12-02: qty 30, 30d supply, fill #1
  Filled 2023-12-02: qty 30, 30d supply, fill #0
  Filled 2023-12-28 – 2023-12-30 (×2): qty 30, 30d supply, fill #1
  Filled 2024-01-14 – 2024-02-04 (×3): qty 30, 30d supply, fill #2
  Filled 2024-02-13: qty 30, 30d supply, fill #3

## 2023-10-28 MED ORDER — DIGOXIN 125 MCG PO TABS
0.1250 mg | ORAL_TABLET | Freq: Every day | ORAL | 5 refills | Status: AC
  Filled 2023-10-28 – 2023-10-29 (×3): qty 30, 30d supply, fill #0
  Filled 2023-12-02: qty 30, 30d supply, fill #1
  Filled 2023-12-02: qty 30, 30d supply, fill #0
  Filled 2023-12-28 – 2023-12-30 (×2): qty 30, 30d supply, fill #1
  Filled 2024-01-14 – 2024-02-04 (×3): qty 30, 30d supply, fill #2

## 2023-10-28 MED ORDER — BUSPIRONE HCL 5 MG PO TABS
5.0000 mg | ORAL_TABLET | Freq: Two times a day (BID) | ORAL | 5 refills | Status: AC
  Filled 2023-10-28 – 2023-10-29 (×3): qty 60, 30d supply, fill #0
  Filled 2023-12-20: qty 60, 30d supply, fill #1
  Filled 2023-12-28 – 2024-01-14 (×2): qty 60, 30d supply, fill #2
  Filled 2024-02-13: qty 60, 30d supply, fill #3

## 2023-10-28 MED ORDER — SPIRONOLACTONE 25 MG PO TABS
25.0000 mg | ORAL_TABLET | Freq: Every day | ORAL | 5 refills | Status: AC
  Filled 2023-10-28 – 2023-12-02 (×4): qty 30, 30d supply, fill #0
  Filled 2023-12-02 – 2023-12-30 (×3): qty 30, 30d supply, fill #1
  Filled 2024-01-14 – 2024-02-04 (×3): qty 30, 30d supply, fill #2

## 2023-10-29 ENCOUNTER — Other Ambulatory Visit (HOSPITAL_COMMUNITY): Payer: Self-pay

## 2023-10-29 ENCOUNTER — Other Ambulatory Visit: Payer: Self-pay

## 2023-11-28 ENCOUNTER — Ambulatory Visit: Payer: Self-pay

## 2023-11-28 DIAGNOSIS — I447 Left bundle-branch block, unspecified: Secondary | ICD-10-CM

## 2023-11-28 LAB — CUP PACEART REMOTE DEVICE CHECK
Battery Remaining Longevity: 111 mo
Battery Voltage: 3.01 V
Brady Statistic RV Percent Paced: 99.91 %
Date Time Interrogation Session: 20251105192914
HighPow Impedance: 67 Ohm
Implantable Lead Connection Status: 753985
Implantable Lead Connection Status: 753985
Implantable Lead Connection Status: 753985
Implantable Lead Implant Date: 20250625
Implantable Lead Implant Date: 20250625
Implantable Lead Implant Date: 20250625
Implantable Lead Location: 753858
Implantable Lead Location: 753859
Implantable Lead Location: 753860
Implantable Lead Model: 4598
Implantable Lead Model: 5076
Implantable Pulse Generator Implant Date: 20250625
Lead Channel Impedance Value: 399 Ohm
Lead Channel Impedance Value: 399 Ohm
Lead Channel Impedance Value: 399 Ohm
Lead Channel Impedance Value: 418 Ohm
Lead Channel Impedance Value: 437 Ohm
Lead Channel Impedance Value: 513 Ohm
Lead Channel Impedance Value: 551 Ohm
Lead Channel Impedance Value: 589 Ohm
Lead Channel Impedance Value: 741 Ohm
Lead Channel Impedance Value: 760 Ohm
Lead Channel Impedance Value: 760 Ohm
Lead Channel Impedance Value: 760 Ohm
Lead Channel Impedance Value: 760 Ohm
Lead Channel Pacing Threshold Amplitude: 0.625 V
Lead Channel Pacing Threshold Amplitude: 0.625 V
Lead Channel Pacing Threshold Amplitude: 1.375 V
Lead Channel Pacing Threshold Pulse Width: 0.4 ms
Lead Channel Pacing Threshold Pulse Width: 0.4 ms
Lead Channel Pacing Threshold Pulse Width: 0.4 ms
Lead Channel Sensing Intrinsic Amplitude: 1.4 mV
Lead Channel Sensing Intrinsic Amplitude: 9.8 mV
Lead Channel Setting Pacing Amplitude: 1.5 V
Lead Channel Setting Pacing Amplitude: 2 V
Lead Channel Setting Pacing Amplitude: 2 V
Lead Channel Setting Pacing Pulse Width: 0.4 ms
Lead Channel Setting Pacing Pulse Width: 0.4 ms
Lead Channel Setting Sensing Sensitivity: 0.3 mV
Zone Setting Status: 755011
Zone Setting Status: 755011
Zone Setting Status: 755011

## 2023-11-29 ENCOUNTER — Ambulatory Visit: Payer: Self-pay | Admitting: Cardiology

## 2023-11-29 NOTE — Progress Notes (Signed)
 Remote ICD Transmission

## 2023-12-02 ENCOUNTER — Other Ambulatory Visit: Payer: Self-pay

## 2023-12-02 ENCOUNTER — Other Ambulatory Visit (HOSPITAL_COMMUNITY): Payer: Self-pay

## 2023-12-20 ENCOUNTER — Other Ambulatory Visit: Payer: Self-pay

## 2023-12-20 ENCOUNTER — Other Ambulatory Visit (HOSPITAL_COMMUNITY): Payer: Self-pay

## 2023-12-28 ENCOUNTER — Other Ambulatory Visit (HOSPITAL_COMMUNITY): Payer: Self-pay

## 2023-12-28 ENCOUNTER — Other Ambulatory Visit: Payer: Self-pay

## 2023-12-29 ENCOUNTER — Other Ambulatory Visit (HOSPITAL_COMMUNITY): Payer: Self-pay

## 2023-12-30 ENCOUNTER — Other Ambulatory Visit: Payer: Self-pay

## 2024-01-14 ENCOUNTER — Other Ambulatory Visit: Payer: Self-pay

## 2024-02-04 ENCOUNTER — Other Ambulatory Visit: Payer: Self-pay

## 2024-02-04 ENCOUNTER — Other Ambulatory Visit (HOSPITAL_COMMUNITY): Payer: Self-pay

## 2024-02-14 ENCOUNTER — Other Ambulatory Visit (HOSPITAL_COMMUNITY): Payer: Self-pay

## 2024-02-27 ENCOUNTER — Ambulatory Visit: Payer: Self-pay

## 2024-02-28 LAB — CUP PACEART REMOTE DEVICE CHECK
Battery Remaining Longevity: 108 mo
Battery Voltage: 2.99 V
Brady Statistic RV Percent Paced: 99.94 %
Date Time Interrogation Session: 20260204202931
HighPow Impedance: 68 Ohm
Implantable Lead Connection Status: 753985
Implantable Lead Connection Status: 753985
Implantable Lead Connection Status: 753985
Implantable Lead Implant Date: 20250625
Implantable Lead Implant Date: 20250625
Implantable Lead Implant Date: 20250625
Implantable Lead Location: 753858
Implantable Lead Location: 753859
Implantable Lead Location: 753860
Implantable Lead Model: 4598
Implantable Lead Model: 5076
Implantable Pulse Generator Implant Date: 20250625
Lead Channel Impedance Value: 361 Ohm
Lead Channel Impedance Value: 399 Ohm
Lead Channel Impedance Value: 437 Ohm
Lead Channel Impedance Value: 437 Ohm
Lead Channel Impedance Value: 437 Ohm
Lead Channel Impedance Value: 456 Ohm
Lead Channel Impedance Value: 494 Ohm
Lead Channel Impedance Value: 589 Ohm
Lead Channel Impedance Value: 779 Ohm
Lead Channel Impedance Value: 779 Ohm
Lead Channel Impedance Value: 779 Ohm
Lead Channel Impedance Value: 798 Ohm
Lead Channel Impedance Value: 798 Ohm
Lead Channel Pacing Threshold Amplitude: 0.5 V
Lead Channel Pacing Threshold Amplitude: 0.625 V
Lead Channel Pacing Threshold Amplitude: 1.375 V
Lead Channel Pacing Threshold Pulse Width: 0.4 ms
Lead Channel Pacing Threshold Pulse Width: 0.4 ms
Lead Channel Pacing Threshold Pulse Width: 0.4 ms
Lead Channel Sensing Intrinsic Amplitude: 1.3 mV
Lead Channel Sensing Intrinsic Amplitude: 18.1 mV
Lead Channel Setting Pacing Amplitude: 1.5 V
Lead Channel Setting Pacing Amplitude: 2 V
Lead Channel Setting Pacing Amplitude: 2 V
Lead Channel Setting Pacing Pulse Width: 0.4 ms
Lead Channel Setting Pacing Pulse Width: 0.4 ms
Lead Channel Setting Sensing Sensitivity: 0.3 mV
Zone Setting Status: 755011
Zone Setting Status: 755011
Zone Setting Status: 755011
